# Patient Record
Sex: Male | Born: 1960
Health system: Southern US, Community
[De-identification: ages and names within clinical notes are randomized; demographics above are authoritative.]

## PROBLEM LIST (undated history)

## (undated) DIAGNOSIS — M109 Gout, unspecified: Secondary | ICD-10-CM

## (undated) DIAGNOSIS — R51 Headache: Secondary | ICD-10-CM

## (undated) DIAGNOSIS — I469 Cardiac arrest, cause unspecified: Secondary | ICD-10-CM

## (undated) DIAGNOSIS — Z86718 Personal history of other venous thrombosis and embolism: Secondary | ICD-10-CM

## (undated) DIAGNOSIS — G373 Acute transverse myelitis in demyelinating disease of central nervous system: Secondary | ICD-10-CM

## (undated) DIAGNOSIS — M1990 Unspecified osteoarthritis, unspecified site: Secondary | ICD-10-CM

## (undated) DIAGNOSIS — I2699 Other pulmonary embolism without acute cor pulmonale: Secondary | ICD-10-CM

## (undated) DIAGNOSIS — M549 Dorsalgia, unspecified: Secondary | ICD-10-CM

## (undated) DIAGNOSIS — G8929 Other chronic pain: Secondary | ICD-10-CM

## (undated) DIAGNOSIS — M2040 Other hammer toe(s) (acquired), unspecified foot: Secondary | ICD-10-CM

## (undated) DIAGNOSIS — I10 Essential (primary) hypertension: Secondary | ICD-10-CM

## (undated) HISTORY — PX: HAMMER TOE SURGERY: SHX385

## (undated) HISTORY — DX: Personal history of other venous thrombosis and embolism: Z86.718

## (undated) HISTORY — DX: Cardiac arrest, cause unspecified: I46.9

## (undated) HISTORY — DX: Other hammer toe(s) (acquired), unspecified foot: M20.40

## (undated) HISTORY — DX: Acute transverse myelitis in demyelinating disease of central nervous system: G37.3

## (undated) HISTORY — DX: Other pulmonary embolism without acute cor pulmonale: I26.99

---
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
## 2011-12-05 ENCOUNTER — Ambulatory Visit (INDEPENDENT_AMBULATORY_CARE_PROVIDER_SITE_OTHER): Payer: BC Managed Care – PPO | Admitting: Emergency Medicine

## 2011-12-05 VITALS — BP 110/64 | HR 66 | Temp 98.7°F | Resp 18 | Ht 78.0 in | Wt 308.0 lb

## 2011-12-05 DIAGNOSIS — Z111 Encounter for screening for respiratory tuberculosis: Secondary | ICD-10-CM

## 2011-12-05 NOTE — Progress Notes (Signed)
   Date:  12/05/2011   Name:  MALIQUE DRISKILL   DOB:  04-Oct-1960   MRN:  [id number] Gender: male Age: 51 y.o.  PCP:  No primary provider on file.    Chief Complaint: PPD Reading   History of Present Illness:  Matthew Juarez is a 51 y.o. pleasant patient who presents with the following:  For TB skin test  There is no problem list on file for this patient.   No past medical history on file.  No past surgical history on file.  History  Substance Use Topics  . Smoking status: Never Smoker   . Smokeless tobacco: Not on file  . Alcohol Use: No    No family history on file.  No Known Allergies  Medication list has been reviewed and updated.  No current outpatient prescriptions on file prior to visit.    Review of Systems:  As per HPI, otherwise negative.    Physical Examination: Filed Vitals:   12/05/11 1648  BP: 110/64  Pulse: 66  Temp: 98.7 F (37.1 C)  Resp: 18   Filed Vitals:   12/05/11 1648  Height: 6\' 6"  (1.981 m)  Weight: 308 lb (139.708 kg)   Body mass index is 35.59 kg/(m^2). Ideal Body Weight: Weight in (lb) to have BMI = 25: 215.9    GEN: WDWN, NAD, Non-toxic, Alert & Oriented x 3 HEENT: Atraumatic, Normocephalic.  Ears and Nose: No external deformity. EXTR: No clubbing/cyanosis/edema NEURO: Normal gait.  PSYCH: Normally interactive. Conversant. Not depressed or anxious appearing.  Calm demeanor.     Assessment and Plan: TB skin test   Carmelina Dane, MD

## 2011-12-05 NOTE — Addendum Note (Signed)
Addended by: Eulah Pont on: 12/05/2011 04:58 PM   Modules accepted: Orders

## 2011-12-08 ENCOUNTER — Encounter (INDEPENDENT_AMBULATORY_CARE_PROVIDER_SITE_OTHER): Payer: BC Managed Care – PPO | Admitting: Internal Medicine

## 2011-12-08 DIAGNOSIS — Z111 Encounter for screening for respiratory tuberculosis: Secondary | ICD-10-CM

## 2012-04-12 ENCOUNTER — Emergency Department: Payer: Self-pay | Admitting: Emergency Medicine

## 2012-04-12 LAB — COMPREHENSIVE METABOLIC PANEL
Albumin: 4.2 g/dL (ref 3.4–5.0)
Anion Gap: 5 — ABNORMAL LOW (ref 7–16)
BUN: 14 mg/dL (ref 7–18)
Bilirubin,Total: 1 mg/dL (ref 0.2–1.0)
Calcium, Total: 9.2 mg/dL (ref 8.5–10.1)
Chloride: 104 mmol/L (ref 98–107)
Creatinine: 0.99 mg/dL (ref 0.60–1.30)
EGFR (African American): >60
Osmolality: 270 (ref 275–301)
Potassium: 4 mmol/L (ref 3.5–5.1)
SGOT(AST): 29 U/L (ref 15–37)
SGPT (ALT): 32 U/L (ref 12–78)
Sodium: 135 mmol/L — ABNORMAL LOW (ref 136–145)
Total Protein: 7.7 g/dL (ref 6.4–8.2)

## 2012-04-12 LAB — CBC
HCT: 43.8 % (ref 40.0–52.0)
MCH: 28.2 pg (ref 26.0–34.0)
RDW: 13.3 % (ref 11.5–14.5)

## 2012-04-12 LAB — SEDIMENTATION RATE: Erythrocyte Sed Rate: 4 mm/hr (ref 0–20)

## 2012-04-16 ENCOUNTER — Emergency Department (HOSPITAL_COMMUNITY): Payer: BC Managed Care – PPO

## 2012-04-16 ENCOUNTER — Encounter (HOSPITAL_COMMUNITY): Payer: Self-pay | Admitting: Emergency Medicine

## 2012-04-16 ENCOUNTER — Inpatient Hospital Stay (HOSPITAL_COMMUNITY)
Admission: EM | Admit: 2012-04-16 | Discharge: 2012-04-20 | DRG: 020 | Disposition: A | Discharge status: Rehabilitation Facility | Payer: BC Managed Care – PPO | Attending: Internal Medicine | Admitting: Internal Medicine

## 2012-04-16 DIAGNOSIS — M546 Pain in thoracic spine: Secondary | ICD-10-CM

## 2012-04-16 DIAGNOSIS — I10 Essential (primary) hypertension: Secondary | ICD-10-CM | POA: Diagnosis present

## 2012-04-16 DIAGNOSIS — I4729 Other ventricular tachycardia: Secondary | ICD-10-CM

## 2012-04-16 DIAGNOSIS — S82899A Other fracture of unspecified lower leg, initial encounter for closed fracture: Secondary | ICD-10-CM | POA: Diagnosis present

## 2012-04-16 DIAGNOSIS — G8929 Other chronic pain: Secondary | ICD-10-CM | POA: Diagnosis present

## 2012-04-16 DIAGNOSIS — S82891A Other fracture of right lower leg, initial encounter for closed fracture: Secondary | ICD-10-CM

## 2012-04-16 DIAGNOSIS — I472 Ventricular tachycardia, unspecified: Secondary | ICD-10-CM | POA: Diagnosis not present

## 2012-04-16 DIAGNOSIS — G959 Disease of spinal cord, unspecified: Secondary | ICD-10-CM

## 2012-04-16 DIAGNOSIS — M549 Dorsalgia, unspecified: Secondary | ICD-10-CM | POA: Diagnosis present

## 2012-04-16 DIAGNOSIS — G373 Acute transverse myelitis in demyelinating disease of central nervous system: Secondary | ICD-10-CM

## 2012-04-16 DIAGNOSIS — G8220 Paraplegia, unspecified: Secondary | ICD-10-CM | POA: Diagnosis present

## 2012-04-16 DIAGNOSIS — M109 Gout, unspecified: Secondary | ICD-10-CM | POA: Diagnosis present

## 2012-04-16 DIAGNOSIS — W19XXXA Unspecified fall, initial encounter: Secondary | ICD-10-CM | POA: Diagnosis present

## 2012-04-16 HISTORY — DX: Gout, unspecified: M10.9

## 2012-04-16 HISTORY — DX: Dorsalgia, unspecified: M54.9

## 2012-04-16 HISTORY — DX: Essential (primary) hypertension: I10

## 2012-04-16 HISTORY — DX: Other chronic pain: G89.29

## 2012-04-16 LAB — COMPREHENSIVE METABOLIC PANEL
BUN: 15 mg/dL (ref 6–23)
CO2: 24 mEq/L (ref 19–32)
Calcium: 9.7 mg/dL (ref 8.4–10.5)
Creatinine, Ser: 0.88 mg/dL (ref 0.50–1.35)
GFR calc Af Amer: >90 mL/min (ref >90)
GFR calc non Af Amer: >90 mL/min (ref >90)
Glucose, Bld: 109 mg/dL — ABNORMAL HIGH (ref 70–99)
Sodium: 138 mEq/L (ref 135–145)
Total Protein: 7.3 g/dL (ref 6.0–8.3)

## 2012-04-16 LAB — CBC WITH DIFFERENTIAL/PLATELET
Eosinophils Absolute: 0.2 10*3/uL (ref 0.0–0.7)
Eosinophils Relative: 3 % (ref 0–5)
HCT: 44.3 % (ref 39.0–52.0)
Lymphocytes Relative: 15 % (ref 12–46)
Lymphs Abs: 0.9 10*3/uL (ref 0.7–4.0)
MCH: 29.3 pg (ref 26.0–34.0)
MCV: 85.9 fL (ref 78.0–100.0)
Monocytes Absolute: 0.5 10*3/uL (ref 0.1–1.0)
Monocytes Relative: 9 % (ref 3–12)
Platelets: 170 10*3/uL (ref 150–400)
RBC: 5.16 MIL/uL (ref 4.22–5.81)

## 2012-04-16 MED ORDER — HYDROCODONE-ACETAMINOPHEN 5-325 MG PO TABS
1.0000 | ORAL_TABLET | Freq: Once | ORAL | Status: AC
  Administered 2012-04-16: 1 via ORAL
  Filled 2012-04-16: qty 1

## 2012-04-16 MED ORDER — GADOBENATE DIMEGLUMINE 529 MG/ML IV SOLN
20.0000 mL | Freq: Once | INTRAVENOUS | Status: AC
  Administered 2012-04-16: 20 mL via INTRAVENOUS

## 2012-04-16 NOTE — ED Notes (Signed)
Pt in MRI at this time 

## 2012-04-16 NOTE — ED Notes (Signed)
Monitor shows 02sats to be 78%/RA, pt laying on hand and hand has bluish tint sts it has tingling feeling. Pt asked to lay on back pt 02 sats increased to 98% pt sts numbness and tingling has gone away

## 2012-04-16 NOTE — ED Notes (Signed)
Pt sent here by Dr Sherryll Burger for increased leg and foot weakness x 5 days; concerned for possible spinal cord issue

## 2012-04-16 NOTE — ED Notes (Signed)
Pt @MRI.

## 2012-04-16 NOTE — ED Provider Notes (Addendum)
History     CSN: [id number]  Arrival date & time 04/16/12  1659   None     Chief Complaint  Patient presents with  . Numbness    (Consider location/radiation/quality/duration/timing/severity/associated sxs/prior treatment) Patient is a 52 y.o. male presenting with neurologic complaint. The history is provided by a relative (Pt sent in by a Dr. Sherryll Burger, a neurologist in [place], [place].).  Neurologic Problem The primary symptoms include focal weakness and loss of sensation. Primary symptoms do not include fever. The symptoms began 5 to 7 days ago. Focality: Pt has had numbness from the mid chest to his feet, starting 5 days ago and getting worse.  He fell yesterday, injuring the right ankle. Context: There was no history of injury, and no precipitating event.  Weakness began greater than 24 hours ago. The weakness is worsening. There is decreased muscle function with maximum physical effort.  Region/motion of weakness: Weakness and numbness in both legs. There is impairment of the following actions: walking.  Additional symptoms include weakness, pain, lower back pain and loss of balance. Medical issues also include hypertension. Associated medical issues comments: Gout.    Past Medical History  Diagnosis Date  . Chronic back pain     History reviewed. No pertinent past surgical history.  History reviewed. No pertinent family history.  History  Substance Use Topics  . Smoking status: Never Smoker   . Smokeless tobacco: Not on file  . Alcohol Use: No      Review of Systems  Constitutional: Negative for fever and chills.  HENT: Negative.   Eyes: Negative.   Respiratory: Negative.   Cardiovascular: Negative.   Gastrointestinal: Negative.        No BM for 2 days.  Genitourinary: Negative.        No incontinence.    Neurological: Positive for focal weakness, weakness and loss of balance.  Psychiatric/Behavioral: Negative.     Allergies  Review of patient's allergies  indicates no known allergies.  Home Medications  No current outpatient prescriptions on file.  BP 128/73  Pulse 94  Temp 97.8 F (36.6 C) (Oral)  Resp 18  SpO2 98%  Physical Exam  Nursing note and vitals reviewed. Constitutional: He is oriented to person, place, and time. He appears well-developed and well-nourished.       Complains of back pain and right ankle pain.  HENT:  Head: Normocephalic and atraumatic.  Right Ear: External ear normal.  Left Ear: External ear normal.  Mouth/Throat: Oropharynx is clear and moist.  Eyes: Conjunctivae normal are normal. Pupils are equal, round, and reactive to light.  Neck: Normal range of motion. Neck supple.  Cardiovascular: Normal rate, regular rhythm and normal heart sounds.   Pulmonary/Chest: Effort normal and breath sounds normal.  Abdominal: Soft. Bowel sounds are normal.  Musculoskeletal: Normal range of motion.       He localizes numbness from the midchest down to his feet.  There is no palpable deformity of the thoracic or lumbar spine.  He has swelling of the right ankle and pain on ROM of the right ankle.  Neurological: He is alert and oriented to person, place, and time.       Qualitative numbness in his body from midchest down to his feet.    Skin: Skin is warm and dry.  Psychiatric: He has a normal mood and affect. His behavior is normal.    ED Course  CRITICAL CARE Performed by: Osvaldo Human Authorized by: Osvaldo Human Total  critical care time: 60 minutes Critical care was necessary to treat or prevent imminent or life-threatening deterioration of the following conditions: 52 yo man has numbness in his body from the chest down, with bilateral leg weakness and a fall that injured the right ankle.  MR of T-spine shows thoracic spine lesion. Critical care was time spent personally by me on the following activities: discussions with consultants, examination of patient, obtaining history from patient or surrogate,  ordering and performing treatments and interventions, ordering and review of laboratory studies and ordering and review of radiographic studies.   (including critical care time)   Labs Reviewed  CBC WITH DIFFERENTIAL  COMPREHENSIVE METABOLIC PANEL  URINALYSIS, ROUTINE W REFLEX MICROSCOPIC   6:37 PM Note from Dr. Sherryll Burger reviewed.  Lab workup ordered.  Call to Davonna Belling, M.D., neuroradiologist and case discussed.  Ordered MR of the thoracic spine.  PO pain medicine ordered.  6:52 PM  Date: 04/16/2012  Rate: 83  Rhythm: normal sinus rhythm  QRS Axis: normal  Intervals: QT prolonged  ST/T Wave abnormalities: normal  Conduction Disutrbances:none  Narrative Interpretation: Abnormal EKG  Old EKG Reviewed: none available   11:44 PM Results for orders placed during the hospital encounter of 04/16/12  CBC WITH DIFFERENTIAL      Component Value Range   WBC 5.9  4.0 - 10.5 K/uL   RBC 5.16  4.22 - 5.81 MIL/uL   Hemoglobin 15.1  13.0 - 17.0 g/dL   HCT 16.1  09.6 - 04.5 %   MCV 85.9  78.0 - 100.0 fL   MCH 29.3  26.0 - 34.0 pg   MCHC 34.1  30.0 - 36.0 g/dL   RDW 40.9  81.1 - 91.4 %   Platelets 170  150 - 400 K/uL   Neutrophils Relative 73  43 - 77 %   Neutro Abs 4.3  1.7 - 7.7 K/uL   Lymphocytes Relative 15  12 - 46 %   Lymphs Abs 0.9  0.7 - 4.0 K/uL   Monocytes Relative 9  3 - 12 %   Monocytes Absolute 0.5  0.1 - 1.0 K/uL   Eosinophils Relative 3  0 - 5 %   Eosinophils Absolute 0.2  0.0 - 0.7 K/uL   Basophils Relative 0  0 - 1 %   Basophils Absolute 0.0  0.0 - 0.1 K/uL  COMPREHENSIVE METABOLIC PANEL      Component Value Range   Sodium 138  135 - 145 mEq/L   Potassium 3.8  3.5 - 5.1 mEq/L   Chloride 101  96 - 112 mEq/L   CO2 24  19 - 32 mEq/L   Glucose, Bld 109 (*) 70 - 99 mg/dL   BUN 15  6 - 23 mg/dL   Creatinine, Ser 7.82  0.50 - 1.35 mg/dL   Calcium 9.7  8.4 - 95.6 mg/dL   Total Protein 7.3  6.0 - 8.3 g/dL   Albumin 4.0  3.5 - 5.2 g/dL   AST 19  0 - 37 U/L   ALT 22  0  - 53 U/L   Alkaline Phosphatase 80  39 - 117 U/L   Total Bilirubin 1.0  0.3 - 1.2 mg/dL   GFR calc non Af Amer >90  >90 mL/min   GFR calc Af Amer >90  >90 mL/min   Dg Chest 2 View  04/16/2012  *RADIOLOGY REPORT*  Clinical Data: Numbness in legs.  Fall.  CHEST - 2 VIEW  Comparison: None.  Findings: Lateral views  degraded by patient arm position.  Frontal view is mildly degraded by patient body habitus.  Decreased mid thoracic vertebral body height at numerous levels is favored to be within normal variation.  Midline trachea.  Normal heart size.  Mild left hemidiaphragm elevation. No pleural effusion or pneumothorax.  Clear lungs.  No free intraperitoneal air.  IMPRESSION: Mildly low lung volumes, without acute disease.   Original Report Authenticated By: Jeronimo Greaves, M.D.    Dg Ankle Complete Right  04/16/2012  *RADIOLOGY REPORT*  Clinical Data: Trauma with pain and numbness.  RIGHT ANKLE - COMPLETE 3+ VIEW  Comparison: None.  Findings: Minimally displaced oblique fracture of the distal fibula with overlying soft tissue swelling. Base of fifth metatarsal and talar dome intact.  There is osseous irregularity about the anterior tibia on the lateral view.  IMPRESSION: Distal fibular fracture.  Anterior tibial osseous irregularity, favored to be degenerative.   Original Report Authenticated By: Jeronimo Greaves, M.D.     Verbal report from Dr. Benard Rink, neuroradiologist, shows a thoracic spine lesion of uncertain cause, differential diagnosis includes a transverse myelitis, tumor, multiple sclerosis.  Pt seen by Dr. Thana Farr, neurologist, who will perform lumbar puncture and advise on Rx with steroids.  She wants internal medicine to admit pt.  Will also consult orthopedics regarding pt's fracture of the right ankle.  12:20 AM Dr. Dorene Grebe will see pt in AM for orthopedic consultation.   1:16 AM Case discussed with Dr. Kara Pacer, who also spoke to Dr. Thad Ranger.  Admit to neuro floor.  1. Back pain,  thoracic   2. Transverse myelitis   3. Fracture of right ankle       Carleene Cooper III, MD 04/17/12 [id number]     Carleene Cooper III, MD 04/17/12 [phone]

## 2012-04-17 ENCOUNTER — Encounter (HOSPITAL_COMMUNITY): Payer: Self-pay | Admitting: Internal Medicine

## 2012-04-17 DIAGNOSIS — G8220 Paraplegia, unspecified: Secondary | ICD-10-CM | POA: Diagnosis present

## 2012-04-17 DIAGNOSIS — G959 Disease of spinal cord, unspecified: Secondary | ICD-10-CM

## 2012-04-17 DIAGNOSIS — I472 Ventricular tachycardia: Secondary | ICD-10-CM

## 2012-04-17 DIAGNOSIS — R6889 Other general symptoms and signs: Secondary | ICD-10-CM

## 2012-04-17 DIAGNOSIS — G0489 Other myelitis: Secondary | ICD-10-CM

## 2012-04-17 DIAGNOSIS — G373 Acute transverse myelitis in demyelinating disease of central nervous system: Secondary | ICD-10-CM

## 2012-04-17 DIAGNOSIS — S82899A Other fracture of unspecified lower leg, initial encounter for closed fracture: Secondary | ICD-10-CM

## 2012-04-17 LAB — CSF CELL COUNT WITH DIFFERENTIAL
Eosinophils, CSF: 0 % (ref 0–1)
Monocyte-Macrophage-Spinal Fluid: 26 % (ref 15–45)
RBC Count, CSF: 350 /mm3 — ABNORMAL HIGH
Segmented Neutrophils-CSF: 7 % — ABNORMAL HIGH (ref 0–6)
WBC, CSF: 18 /mm3 (ref 0–5)

## 2012-04-17 LAB — ANGIOTENSIN CONVERTING ENZYME: Angiotensin-Converting Enzyme: 28 U/L (ref 8–52)

## 2012-04-17 LAB — PHOSPHORUS: Phosphorus: 2.6 mg/dL (ref 2.3–4.6)

## 2012-04-17 LAB — SEDIMENTATION RATE: Sed Rate: 10 mm/hr (ref 0–16)

## 2012-04-17 LAB — URINALYSIS, ROUTINE W REFLEX MICROSCOPIC
Glucose, UA: NEGATIVE mg/dL
Leukocytes, UA: NEGATIVE
Nitrite: NEGATIVE
pH: 5 (ref 5.0–8.0)

## 2012-04-17 LAB — MAGNESIUM: Magnesium: 1.9 mg/dL (ref 1.5–2.5)

## 2012-04-17 LAB — COMPREHENSIVE METABOLIC PANEL
Albumin: 4.1 g/dL (ref 3.5–5.2)
BUN: 15 mg/dL (ref 6–23)
Calcium: 9.9 mg/dL (ref 8.4–10.5)
GFR calc Af Amer: >90 mL/min (ref >90)
Glucose, Bld: 155 mg/dL — ABNORMAL HIGH (ref 70–99)
Sodium: 135 mEq/L (ref 135–145)
Total Protein: 7.7 g/dL (ref 6.0–8.3)

## 2012-04-17 LAB — C-REACTIVE PROTEIN: CRP: 6.5 mg/dL — ABNORMAL HIGH (ref <0.60)

## 2012-04-17 LAB — CBC
HCT: 43.5 % (ref 39.0–52.0)
Hemoglobin: 15 g/dL (ref 13.0–17.0)
MCV: 85.8 fL (ref 78.0–100.0)
WBC: 8.9 10*3/uL (ref 4.0–10.5)

## 2012-04-17 LAB — PROTEIN AND GLUCOSE, CSF: Glucose, CSF: 70 mg/dL (ref 43–76)

## 2012-04-17 LAB — GRAM STAIN

## 2012-04-17 MED ORDER — ONDANSETRON HCL 4 MG PO TABS: 4.0000 mg | ORAL_TABLET | Freq: Four times a day (QID) | ORAL | Status: DC | PRN

## 2012-04-17 MED ORDER — MAGNESIUM HYDROXIDE 400 MG/5ML PO SUSP
30.0000 mL | Freq: Every day | ORAL | Status: DC | PRN
  Administered 2012-04-17: 30 mL via ORAL
  Filled 2012-04-17: qty 30

## 2012-04-17 MED ORDER — METHYLPREDNISOLONE SODIUM SUCC 1000 MG IJ SOLR
500.0000 mg | Freq: Two times a day (BID) | INTRAMUSCULAR | Status: DC
  Administered 2012-04-17 – 2012-04-20 (×6): 500 mg via INTRAVENOUS
  Filled 2012-04-17 (×9): qty 4

## 2012-04-17 MED ORDER — DOCUSATE SODIUM 100 MG PO CAPS
100.0000 mg | ORAL_CAPSULE | Freq: Two times a day (BID) | ORAL | Status: DC
  Administered 2012-04-17 – 2012-04-20 (×5): 100 mg via ORAL
  Filled 2012-04-17 (×7): qty 1

## 2012-04-17 MED ORDER — AMLODIPINE-OLMESARTAN 5-20 MG PO TABS: 1.0000 | ORAL_TABLET | Freq: Every day | ORAL | Status: DC

## 2012-04-17 MED ORDER — HYDROCODONE-ACETAMINOPHEN 5-325 MG PO TABS
1.0000 | ORAL_TABLET | ORAL | Status: DC | PRN
  Administered 2012-04-17: 2 via ORAL
  Administered 2012-04-17: 1 via ORAL
  Administered 2012-04-19: 2 via ORAL
  Filled 2012-04-17 (×3): qty 2

## 2012-04-17 MED ORDER — HYDROCODONE-ACETAMINOPHEN 5-325 MG PO TABS: 1.0000 | ORAL_TABLET | ORAL | Status: DC | PRN

## 2012-04-17 MED ORDER — AMLODIPINE BESYLATE 5 MG PO TABS
5.0000 mg | ORAL_TABLET | Freq: Every day | ORAL | Status: DC
  Administered 2012-04-17 – 2012-04-20 (×4): 5 mg via ORAL
  Filled 2012-04-17 (×4): qty 1

## 2012-04-17 MED ORDER — PANTOPRAZOLE SODIUM 40 MG PO TBEC
40.0000 mg | DELAYED_RELEASE_TABLET | Freq: Every day | ORAL | Status: DC
  Administered 2012-04-17 – 2012-04-20 (×4): 40 mg via ORAL
  Filled 2012-04-17 (×4): qty 1

## 2012-04-17 MED ORDER — SODIUM CHLORIDE 0.9 % IJ SOLN
3.0000 mL | Freq: Two times a day (BID) | INTRAMUSCULAR | Status: DC
  Administered 2012-04-17 – 2012-04-19 (×5): 3 mL via INTRAVENOUS

## 2012-04-17 MED ORDER — ONDANSETRON HCL 4 MG/2ML IJ SOLN: 4.0000 mg | Freq: Four times a day (QID) | INTRAMUSCULAR | Status: DC | PRN

## 2012-04-17 MED ORDER — MORPHINE SULFATE 2 MG/ML IJ SOLN: 2.0000 mg | INTRAMUSCULAR | Status: DC | PRN

## 2012-04-17 MED ORDER — ACETAMINOPHEN 650 MG RE SUPP: 650.0000 mg | Freq: Four times a day (QID) | RECTAL | Status: DC | PRN

## 2012-04-17 MED ORDER — ACETAMINOPHEN 325 MG PO TABS
650.0000 mg | ORAL_TABLET | Freq: Four times a day (QID) | ORAL | Status: DC | PRN
  Administered 2012-04-20: 650 mg via ORAL
  Filled 2012-04-17: qty 2

## 2012-04-17 MED ORDER — ALLOPURINOL 300 MG PO TABS
300.0000 mg | ORAL_TABLET | Freq: Every day | ORAL | Status: DC
  Administered 2012-04-17 – 2012-04-20 (×4): 300 mg via ORAL
  Filled 2012-04-17 (×4): qty 1

## 2012-04-17 MED ORDER — SODIUM CHLORIDE 0.9 % IV SOLN
INTRAVENOUS | Status: AC
  Administered 2012-04-17: 05:00:00 via INTRAVENOUS

## 2012-04-17 MED ORDER — IRBESARTAN 150 MG PO TABS
150.0000 mg | ORAL_TABLET | Freq: Every day | ORAL | Status: DC
  Administered 2012-04-17 – 2012-04-20 (×4): 150 mg via ORAL
  Filled 2012-04-17 (×4): qty 1

## 2012-04-17 NOTE — Consult Note (Signed)
Reason for Consult: Ankle pain Referring Physician: Dr. Anderson Malta is an 52 y.o. male.  HPI: Matthew Juarez is a previously healthy 52 year old patient who developed weakness in bilateral lower extremities and fell and injured his right ankle. He is currently admitted on the medical service with lesion in the cervical spinal cord. Workup for that is in progress. He describes lower extremity weakness. He denies any other orthopedic complaint from his fall other than right ankle pain.  Past Medical History  Diagnosis Date  . Chronic back pain   . Hypertension   . Gout     History reviewed. No pertinent past surgical history.  Family History  Problem Relation Age of Onset  . Lung cancer Maternal Uncle   . Colon cancer Maternal Aunt     Social History:  reports that he has never smoked. He does not have any smokeless tobacco history on file. He reports that he does not drink alcohol or use illicit drugs.  Allergies: No Known Allergies  Medications: I have reviewed the patient's current medications.  Results for orders placed during the hospital encounter of 04/16/12 (from the past 48 hour(s))  CBC WITH DIFFERENTIAL     Status: Normal   Collection Time   04/16/12  6:18 PM      Component Value Range Comment   WBC 5.9  4.0 - 10.5 K/uL    RBC 5.16  4.22 - 5.81 MIL/uL    Hemoglobin 15.1  13.0 - 17.0 g/dL    HCT 16.1  09.6 - 04.5 %    MCV 85.9  78.0 - 100.0 fL    MCH 29.3  26.0 - 34.0 pg    MCHC 34.1  30.0 - 36.0 g/dL    RDW 40.9  81.1 - 91.4 %    Platelets 170  150 - 400 K/uL    Neutrophils Relative 73  43 - 77 %    Neutro Abs 4.3  1.7 - 7.7 K/uL    Lymphocytes Relative 15  12 - 46 %    Lymphs Abs 0.9  0.7 - 4.0 K/uL    Monocytes Relative 9  3 - 12 %    Monocytes Absolute 0.5  0.1 - 1.0 K/uL    Eosinophils Relative 3  0 - 5 %    Eosinophils Absolute 0.2  0.0 - 0.7 K/uL    Basophils Relative 0  0 - 1 %    Basophils Absolute 0.0  0.0 - 0.1 K/uL   COMPREHENSIVE  METABOLIC PANEL     Status: Abnormal   Collection Time   04/16/12  6:18 PM      Component Value Range Comment   Sodium 138  135 - 145 mEq/L    Potassium 3.8  3.5 - 5.1 mEq/L    Chloride 101  96 - 112 mEq/L    CO2 24  19 - 32 mEq/L    Glucose, Bld 109 (*) 70 - 99 mg/dL    BUN 15  6 - 23 mg/dL    Creatinine, Ser 7.82  0.50 - 1.35 mg/dL    Calcium 9.7  8.4 - 95.6 mg/dL    Total Protein 7.3  6.0 - 8.3 g/dL    Albumin 4.0  3.5 - 5.2 g/dL    AST 19  0 - 37 U/L    ALT 22  0 - 53 U/L    Alkaline Phosphatase 80  39 - 117 U/L    Total Bilirubin 1.0  0.3 -  1.2 mg/dL    GFR calc non Af Amer >90  >90 mL/min    GFR calc Af Amer >90  >90 mL/min   SEDIMENTATION RATE     Status: Normal   Collection Time   04/17/12 12:57 AM      Component Value Range Comment   Sed Rate 10  0 - 16 mm/hr   PROTEIN AND GLUCOSE, CSF     Status: Abnormal   Collection Time   04/17/12 12:59 AM      Component Value Range Comment   Glucose, CSF 70  43 - 76 mg/dL    Total  Protein, CSF 53 (*) 15 - 45 mg/dL   CSF CELL COUNT WITH DIFFERENTIAL     Status: Abnormal   Collection Time   04/17/12 12:59 AM      Component Value Range Comment   Tube # 1      Color, CSF STRAW (*) COLORLESS    Appearance, CSF HAZY (*) CLEAR    Supernatant CLEAR      RBC Count, CSF 350 (*) 0 /cu mm    WBC, CSF 18 (*) 0 - 5 /cu mm    Segmented Neutrophils-CSF 7 (*) 0 - 6 %    Lymphs, CSF 67  40 - 80 %    Monocyte-Macrophage-Spinal Fluid 26  15 - 45 %    Eosinophils, CSF 0  0 - 1 %   GRAM STAIN     Status: Normal   Collection Time   04/17/12 12:59 AM      Component Value Range Comment   Specimen Description CSF      Special Requests Normal      Gram Stain        Value: [id number]     NO ORGANISMS SEEN     WBC PRESENT,BOTH PMN AND MONONUCLEAR     Results Called to: Earlene Plater [id number] @[id number]  [facility]   Report Status 04/17/2012 FINAL     URINALYSIS, ROUTINE W REFLEX MICROSCOPIC     Status: Abnormal   Collection Time   04/17/12  2:01 AM       Component Value Range Comment   Color, Urine AMBER (*) YELLOW BIOCHEMICALS MAY BE AFFECTED BY COLOR   APPearance CLOUDY (*) CLEAR    Specific Gravity, Urine 1.043 (*) 1.005 - 1.030    pH 5.0  5.0 - 8.0    Glucose, UA NEGATIVE  NEGATIVE mg/dL    Hgb urine dipstick NEGATIVE  NEGATIVE    Bilirubin Urine NEGATIVE  NEGATIVE    Ketones, ur 15 (*) NEGATIVE mg/dL    Protein, ur NEGATIVE  NEGATIVE mg/dL    Urobilinogen, UA 0.2  0.0 - 1.0 mg/dL    Nitrite NEGATIVE  NEGATIVE    Leukocytes, UA NEGATIVE  NEGATIVE MICROSCOPIC NOT DONE ON URINES WITH NEGATIVE PROTEIN, BLOOD, LEUKOCYTES, NITRITE, OR GLUCOSE <1000 mg/dL.    Dg Chest 2 View  04/16/2012  *RADIOLOGY REPORT*  Clinical Data: Numbness in legs.  Fall.  CHEST - 2 VIEW  Comparison: None.  Findings: Lateral views degraded by patient arm position.  Frontal view is mildly degraded by patient body habitus.  Decreased mid thoracic vertebral body height at numerous levels is favored to be within normal variation.  Midline trachea.  Normal heart size.  Mild left hemidiaphragm elevation. No pleural effusion or pneumothorax.  Clear lungs.  No free intraperitoneal air.  IMPRESSION: Mildly low lung volumes, without acute disease.   Original Report Authenticated By: Jeronimo Greaves,  M.D.    Dg Ankle Complete Right  04/16/2012  *RADIOLOGY REPORT*  Clinical Data: Trauma with pain and numbness.  RIGHT ANKLE - COMPLETE 3+ VIEW  Comparison: None.  Findings: Minimally displaced oblique fracture of the distal fibula with overlying soft tissue swelling. Base of fifth metatarsal and talar dome intact.  There is osseous irregularity about the anterior tibia on the lateral view.  IMPRESSION: Distal fibular fracture.  Anterior tibial osseous irregularity, favored to be degenerative.   Original Report Authenticated By: Jeronimo Greaves, M.D.    Mr Cervical Spine W Wo Contrast  04/17/2012  *RADIOLOGY REPORT*  Clinical Data:  Gradual onset of ascending numbness.  Recent fall. Now with  significant progressive weakness of the right lower extremity.  No bowel or bladder difficulty.  Abnormal cerebrospinal fluid with elevated protein, and cells.  MRI CERVICAL AND THORACIC SPINE WITHOUT AND WITH CONTRAST  Technique:  Multiplanar and multiecho pulse sequences of the cervical spine, to include the craniocervical junction and cervicothoracic junction, and the thoracic spine, were obtained without and with intravenous contrast.  Contrast: 20mL MULTIHANCE GADOBENATE DIMEGLUMINE 529 MG/ML IV SOLN  Comparison:  None.  MRI CERVICAL SPINE  Findings:  There is there is an intramedullary lower cervical process with prolonged T1 and T2 signal, centrally located within the cord.  The cord is diffusely enlarged from C5 downward into the thoracic region.  There is no syrinx or tonsillar herniation. Axial images demonstrate that the peripheral white matter fiber pathways are spared.  Post infusion, there is variable abnormal enhancement which tends to be eccentric posteriorly in the cervicothoracic region.  Axial images reveal a central protrusion at C5-6 which effaces the anterior subarachnoid space but does not compress the cord or exiting nerve roots.  There is mild annular bulging at C6-7.  At C7- T1 there is asymmetric uncinate spurring on the right.  IMPRESSION: Intrinsic intramedullary cord lesion with prolonged T2 signal and variable abnormal enhancement.  Findings most consistent with idiopathic acute transverse myelitis.  Other considerations such as cord neoplasm, or multiple sclerosis are less likely. Cord infarction is not favored, given the post contrast enhancement.  MRI THORACIC SPINE  Findings: As noted in the cervical region, there is an intramedullary upper thoracic cord lesion with prolonged T1 and T2 signal located centrally within the cord.  This is continuous with the cervical lesion extending from C5 downward as far as the T6 vertebral body, below which the cord assumes a more normal size. The  lesion is solid, expands the cord, and does not represent a dilated central canal.  Post infusion imaging demonstrates variable enhancement which tends to be peripheral.  There is no subarachnoid, subdural, or epidural extramedullary lesion.  Axial images through the thoracic disc spaces demonstrate minor disc pathology which is non compressive.  This can be seen at T1-2 on the left,  T2-T3 on the left, T3-4 on the right, and T4-5 on the right.  No vertebral body abnormality is seen.  No paraspinous enhancement or pleural effusion is noted.  IMPRESSION:  Intrinsic intramedullary cord lesion with prolonged T2 signal and variable abnormal enhancement.  Findings consistent with idiopathic acute transverse myelitis.  Other considerations such as cord neoplasm, or multiple sclerosis are less likely.  I discussed the findings with Dr. Ignacia Palma, EDP, shortly after completion of the study.   Original Report Authenticated By: Davonna Belling, M.D.    Mr Thoracic Spine W Wo Contrast  04/17/2012  *RADIOLOGY REPORT*  Clinical Data:  Gradual onset of  ascending numbness.  Recent fall. Now with significant progressive weakness of the right lower extremity.  No bowel or bladder difficulty.  Abnormal cerebrospinal fluid with elevated protein, and cells.  MRI CERVICAL AND THORACIC SPINE WITHOUT AND WITH CONTRAST  Technique:  Multiplanar and multiecho pulse sequences of the cervical spine, to include the craniocervical junction and cervicothoracic junction, and the thoracic spine, were obtained without and with intravenous contrast.  Contrast: 20mL MULTIHANCE GADOBENATE DIMEGLUMINE 529 MG/ML IV SOLN  Comparison:  None.  MRI CERVICAL SPINE  Findings:  There is there is an intramedullary lower cervical process with prolonged T1 and T2 signal, centrally located within the cord.  The cord is diffusely enlarged from C5 downward into the thoracic region.  There is no syrinx or tonsillar herniation. Axial images demonstrate that the  peripheral white matter fiber pathways are spared.  Post infusion, there is variable abnormal enhancement which tends to be eccentric posteriorly in the cervicothoracic region.  Axial images reveal a central protrusion at C5-6 which effaces the anterior subarachnoid space but does not compress the cord or exiting nerve roots.  There is mild annular bulging at C6-7.  At C7- T1 there is asymmetric uncinate spurring on the right.  IMPRESSION: Intrinsic intramedullary cord lesion with prolonged T2 signal and variable abnormal enhancement.  Findings most consistent with idiopathic acute transverse myelitis.  Other considerations such as cord neoplasm, or multiple sclerosis are less likely. Cord infarction is not favored, given the post contrast enhancement.  MRI THORACIC SPINE  Findings: As noted in the cervical region, there is an intramedullary upper thoracic cord lesion with prolonged T1 and T2 signal located centrally within the cord.  This is continuous with the cervical lesion extending from C5 downward as far as the T6 vertebral body, below which the cord assumes a more normal size. The lesion is solid, expands the cord, and does not represent a dilated central canal.  Post infusion imaging demonstrates variable enhancement which tends to be peripheral.  There is no subarachnoid, subdural, or epidural extramedullary lesion.  Axial images through the thoracic disc spaces demonstrate minor disc pathology which is non compressive.  This can be seen at T1-2 on the left,  T2-T3 on the left, T3-4 on the right, and T4-5 on the right.  No vertebral body abnormality is seen.  No paraspinous enhancement or pleural effusion is noted.  IMPRESSION:  Intrinsic intramedullary cord lesion with prolonged T2 signal and variable abnormal enhancement.  Findings consistent with idiopathic acute transverse myelitis.  Other considerations such as cord neoplasm, or multiple sclerosis are less likely.  I discussed the findings with Dr.  Ignacia Palma, EDP, shortly after completion of the study.   Original Report Authenticated By: Davonna Belling, M.D.     Review of Systems  Constitutional: Negative.   HENT: Negative.   Eyes: Negative.   Respiratory: Negative.   Cardiovascular: Negative.   Gastrointestinal: Negative.   Genitourinary: Negative.   Musculoskeletal: Positive for joint pain and falls.  Skin: Negative.   Neurological: Positive for sensory change and focal weakness.  Psychiatric/Behavioral: Negative.    Blood pressure 133/70, pulse 86, temperature 98.2 F (36.8 C), temperature source Oral, resp. rate 18, height 6\' 5"  (1.956 m), weight 133.6 kg (294 lb 8.6 oz), SpO2 97.00%. Physical Exam  Constitutional: He appears well-developed.  HENT:  Head: Normocephalic.  Eyes: Pupils are equal, round, and reactive to light.  Cardiovascular: Normal rate.   Respiratory: Effort normal.   right lower extremity is splinted toe dorsiflexion  plantar flexion is intact but weak compartments are soft knee range of motion full soft tissue swelling is present laterally tenderness is present in this area as well no other crepitus or difficulty with joint swelling in the wrists elbows shoulders bilaterally or in the left lower extremity ankle knee or hip.  Assessment/Plan: Impression is minimally displaced right lateral malleolus fracture currently splinted no evidence of compartment syndrome plan nonweightbearing right lower extremity followup x-rays in one week workup in progress for lesion in the cervical spine area no other evidence of orthopedic injury at this time  DEAN,GREGORY SCOTT 04/17/2012, 9:47 AM

## 2012-04-17 NOTE — H&P (Signed)
PCP:   Alva Garnet., MD    Chief Complaint:   Lower ext weakness  HPI: Matthew Juarez is a 52 y.o. male   has a past medical history of Chronic back pain; Hypertension; and Gout.   Presented with  Leg numbness and weakness for the past 5 days it was progressive in nature. He denies any back pain, no fever or chills. At this point is un able to put any weight on his legs. He denies any weight loss. HE have not had a BM since Tuesday. NO urinary incontinence. He have had a normal colonoscopy 2 years ago.   He was so weak that he fell yesterday and fractured his right ankle, orthopedics has been consulted and will see him in AM.   MRI showed lesion at the affecting spinal cord with abnormal signal from C5-T6 showing tumor vs demyelination.   Neurology have seen him and have done an LP to evaluate for infection if negative they will start patient on steroids.   Hospitalist was called to admit.   Review of Systems:     Pertinent positives include:  tingling, weakness, constipation  Constitutional:  No weight loss, night sweats, Fevers, chills, fatigue, weight loss  HEENT:  No headaches, Difficulty swallowing,Tooth/dental problems,Sore throat,  No sneezing, itching, ear ache, nasal congestion, post nasal drip,  Cardio-vascular:  No chest pain, Orthopnea, PND, anasarca, dizziness, palpitations.no Bilateral lower extremity swelling  GI:  No heartburn, indigestion, abdominal pain, nausea, vomiting, diarrhea, change in bowel habits, loss of appetite, melena, blood in stool, hematemesis Resp:  no shortness of breath at rest. No dyspnea on exertion, No excess mucus, no productive cough, No non-productive cough, No coughing up of blood.No change in color of mucus.No wheezing. Skin:  no rash or lesions. No jaundice GU:  no dysuria, change in color of urine, no urgency or frequency. No straining to urinate.  No flank pain.  Musculoskeletal:  No joint pain or no joint swelling. No  decreased range of motion. No back pain.  Psych:  No change in mood or affect. No depression or anxiety. No memory loss.  Neuro:  no double vision, no gait abnormality, no slurred speech, no confusion  Otherwise ROS are negative except for above, 10 systems were reviewed  Past Medical History: Past Medical History  Diagnosis Date  . Chronic back pain   . Hypertension   . Gout    History reviewed. No pertinent past surgical history.   Medications: Prior to Admission medications   Medication Sig Start Date End Date Taking? Authorizing Provider  allopurinol (ZYLOPRIM) 300 MG tablet Take 300 mg by mouth daily.   Yes Historical Provider, MD  amLODipine-olmesartan (AZOR) 5-20 MG per tablet Take 1 tablet by mouth daily.   Yes Historical Provider, MD  Cholecalciferol (VITAMIN D PO) Take 1 tablet by mouth daily.   Yes Historical Provider, MD  Coenzyme Q10 (CO Q-10) 200 MG CAPS Take 1 capsule by mouth daily.   Yes Historical Provider, MD  diphenhydramine-acetaminophen (TYLENOL PM) 25-500 MG TABS Take 1 tablet by mouth at bedtime.   Yes Historical Provider, MD    Allergies:  No Known Allergies  Social History:  Ambulatory  independently   Lives at   Home with wife   reports that he has never smoked. He does not have any smokeless tobacco history on file. He reports that he does not drink alcohol.   Family History: family history is not on file.    Physical Exam: Patient  Vitals for the past 24 hrs:  BP Temp Temp src Pulse Resp SpO2  04/17/12 0045 108/53 mmHg - - 93  - 97 %  04/17/12 0000 122/66 mmHg - - 92  - 98 %  04/16/12 2333 101/69 mmHg 99.3 F (37.4 C) Oral 96  17  98 %  04/16/12 2045 129/71 mmHg - - 98  - 98 %  04/16/12 1943 120/81 mmHg - - 90  16  97 %  04/16/12 1706 128/73 mmHg 97.8 F (36.6 C) Oral 94  18  98 %    1. General:  in No Acute distress 2. Psychological: Alert and   Oriented 3. Head/ENT:   Moist   Mucous Membranes                          Head Non  traumatic, neck supple                          Normal   Dentition 4. SKIN: normal  Skin turgor,  Skin clean Dry and intact no rash 5. Heart: Regular rate and rhythm no Murmur, Rub or gallop 6. Lungs: Clear to auscultation bilaterally, no wheezes or crackles   7. Abdomen: Soft, non-tender, Non distended 8. Lower extremities: no clubbing, cyanosis, or edema 9. Neurologically diminished strength on the left lower extremity worse than the right. Diminished sensation on the left below the costovertebral margin. Right lower extremity is hard to examine as patient is in a splint secondary to fracture. Cranial nerves II through XII intact 10. MSK: Diminished range of motion in the right low extremity  body mass index is unknown because there is no height or weight on file.   Labs on Admission:   [facility] 04/16/12 1818  NA 138  K 3.8  CL 101  CO2 24  GLUCOSE 109*  BUN 15  CREATININE 0.88  CALCIUM 9.7  MG --  PHOS --    Basename 04/16/12 1818  AST 19  ALT 22  ALKPHOS 80  BILITOT 1.0  PROT 7.3  ALBUMIN 4.0   No results found for this basename: LIPASE:2,AMYLASE:2 in the last 72 hours  Basename 04/16/12 1818  WBC 5.9  NEUTROABS 4.3  HGB 15.1  HCT 44.3  MCV 85.9  PLT 170   No results found for this basename: CKTOTAL:3,CKMB:3,CKMBINDEX:3,TROPONINI:3 in the last 72 hours No results found for this basename: TSH,T4TOTAL,FREET3,T3FREE,THYROIDAB in the last 72 hours No results found for this basename: VITAMINB12:2,FOLATE:2,FERRITIN:2,TIBC:2,IRON:2,RETICCTPCT:2 in the last 72 hours No results found for this basename: HGBA1C    The CrCl is unknown because both a height and weight (above a minimum accepted value) are required for this calculation. ABG No results found for this basename: phart, pco2, po2, hco3, tco2, acidbasedef, o2sat     No results found for this basename: DDIMER     Other results:  I have pearsonaly reviewed this: ECG REPORT  Rate:83  Rhythm: NSR with  prolonged QT 503 ST&T Change: no ischemic changes.    Cultures: No results found for this basename: sdes, specrequest, cult, reptstatus       Radiological Exams on Admission: Dg Chest 2 View  04/16/2012  *RADIOLOGY REPORT*  Clinical Data: Numbness in legs.  Fall.  CHEST - 2 VIEW  Comparison: None.  Findings: Lateral views degraded by patient arm position.  Frontal view is mildly degraded by patient body habitus.  Decreased mid thoracic vertebral body height at  numerous levels is favored to be within normal variation.  Midline trachea.  Normal heart size.  Mild left hemidiaphragm elevation. No pleural effusion or pneumothorax.  Clear lungs.  No free intraperitoneal air.  IMPRESSION: Mildly low lung volumes, without acute disease.   Original Report Authenticated By: Jeronimo Greaves, M.D.    Dg Ankle Complete Right  04/16/2012  *RADIOLOGY REPORT*  Clinical Data: Trauma with pain and numbness.  RIGHT ANKLE - COMPLETE 3+ VIEW  Comparison: None.  Findings: Minimally displaced oblique fracture of the distal fibula with overlying soft tissue swelling. Base of fifth metatarsal and talar dome intact.  There is osseous irregularity about the anterior tibia on the lateral view.  IMPRESSION: Distal fibular fracture.  Anterior tibial osseous irregularity, favored to be degenerative.   Original Report Authenticated By: Jeronimo Greaves, M.D.     Chart has been reviewed  Assessment/Plan  52 yo With new bilateral paraplegia due to newly found cervical /thoracic spinal cord lesion,  Present on Admission:  . Lower paraplegia -  admit to neurology floor with frequent Neuro checks . Spinal cord lesion - as per neurology, broad differential at this point. As long as there is no evidence of infection Neurology will initiate IV steroids   Prophylaxis: SCD due to recent LP  Protonix  CODE STATUS: FULL CODE  Other plan as per orders.  I have spent a total of 55 min on this admission  Keyle Doby 04/17/2012,  1:08 AM

## 2012-04-17 NOTE — Evaluation (Signed)
Physical Therapy Evaluation Patient Details Name: Matthew Juarez MRN: [id number] DOB: 11/13/1960 Today's Date: 04/17/2012 Time: 6578-4696 PT Time Calculation (min): 29 min  PT Assessment / Plan / Recommendation Clinical Impression  pt adm with progressive weakness over 5-6 days.  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  Pt can  benefit from PT to maximize function    PT Assessment  Patient needs continued PT services    Follow Up Recommendations  CIR    Does the patient have the potential to tolerate intense rehabilitation      Barriers to Discharge Other (comment) (wife works, she might be able to get [employer])      Equipment Recommendations  Other (comment) (TBD post acute)    Recommendations for Other Services Rehab consult   Frequency Min 4X/week    Precautions / Restrictions Precautions Precautions: Fall Precaution Comments: loss of sensation or proprioception Restrictions Weight Bearing Restrictions: Yes RLE Weight Bearing: Partial weight bearing   Pertinent Vitals/Pain       Mobility  Bed Mobility Bed Mobility: Sitting - Scoot to Edge of Bed;Sit to Supine;Scooting to [facility] Sitting - Scoot to Edge of Bed: 4: Min guard;Other (comment) (scooting away from EOB) Sit to Supine: 4: Min assist;HOB flat;Other (comment) (at LE's) Scooting to [facility]: 4: Min assist;Other (comment) (to position his L LE) Details for Bed Mobility Assistance: vc's for technique; some LE assist Transfers Transfers: Squat Pivot Transfers Squat Pivot Transfers: 3: Mod assist;With upper extremity assistance Details for Transfer Assistance: Needs help to position for transfer; vc's for hand placement and truncal and knee support.  pt tends to over compensate with UE's with potnetial to throw off assistance Ambulation/Gait Ambulation/Gait Assistance: Not tested (comment) Stairs: No Wheelchair Mobility Wheelchair Mobility: No    Shoulder Instructions     Exercises     PT Diagnosis: Generalized  weakness;Other (comment) (paraplegia)  PT Problem List: Decreased strength;Decreased mobility;Decreased balance;Decreased knowledge of use of DME;Impaired sensation;Decreased coordination PT Treatment Interventions: DME instruction;Functional mobility training;Therapeutic activities;Therapeutic exercise;Balance training;Patient/family education;Neuromuscular re-education;Other (comment) (pregait)   PT Goals Acute Rehab PT Goals PT Goal Formulation: With patient Time For Goal Achievement: 05/01/12 Potential to Achieve Goals: Good Pt will go Supine/Side to Sit: with supervision;with HOB 0 degrees PT Goal: Supine/Side to Sit - Progress: Goal set today Pt will go Sit to Supine/Side: with supervision;with HOB 0 degrees PT Goal: Sit to Supine/Side - Progress: Goal set today Pt will go Sit to Stand: with mod assist PT Goal: Sit to Stand - Progress: Goal set today Pt will go Stand to Sit: with mod assist PT Goal: Stand to Sit - Progress: Goal set today Pt will Transfer Bed to Chair/Chair to Bed: with min assist PT Transfer Goal: Bed to Chair/Chair to Bed - Progress: Goal set today Additional Goals Additional Goal #1: pregait activity at EOB with +2 pt>=50% PT Goal: Additional Goal #1 - Progress: Goal set today  Visit Information  Last PT Received On: 04/17/12 Assistance Needed: +2    Subjective Data  Subjective: Yeah I can feel anything from the chest down Patient Stated Goal: Healed back up and back to work   Prior Functioning  Home Living Lives With: Spouse Available Help at Discharge: Other (Comment) (family works) Type of Home: House Home Access: Stairs to enter Secretary/administrator of Steps: 5 Entrance Stairs-Rails: Right;Left Home Layout: One level Bathroom Shower/Tub: Tub/shower unit;Tub only;Door [employer] Toilet: Standard Bathroom Accessibility: Yes How Accessible: Accessible via walker;Accessible via wheelchair (only to the front area of the bathroom)  Home Adaptive  Equipment: None (w/c too small for him) Prior Function Level of Independence: Independent Able to Take Stairs?: Yes Driving: Yes Vocation: Full time employment Communication Communication: No difficulties    Cognition  Overall Cognitive Status: Appears within functional limits for tasks assessed/performed Arousal/Alertness: Awake/alert Orientation Level: Oriented X4 / Intact Behavior During Session: [facility] for tasks performed    Extremity/Trunk Assessment Right Upper Extremity Assessment RUE ROM/Strength/Tone: Within functional levels Left Upper Extremity Assessment LUE ROM/Strength/Tone: Within functional levels Right Lower Extremity Assessment RLE ROM/Strength/Tone: Deficits RLE ROM/Strength/Tone Deficits: muscle fasiculation only to command RLE Sensation: Deficits RLE Sensation Deficits: loss of sensation--LT, severely diminished or absent proprioception RLE Coordination: Deficits Left Lower Extremity Assessment LLE ROM/Strength/Tone: Deficits LLE ROM/Strength/Tone Deficits: quads/hams/df/pf >=4/5, hip flexors 4-/5 LLE Sensation: Deficits LLE Sensation Deficits: loss of sensation and severely diminished or absent proprioception LLE Coordination: Deficits LLE Coordination Deficits: gross motor only Trunk Assessment Trunk Assessment:  (gross motor [facility])   Balance Balance Balance Assessed: Yes Static Sitting Balance Static Sitting - Balance Support: Feet supported;No upper extremity supported Static Sitting - Level of Assistance: 5: Stand by assistance Dynamic Sitting Balance Dynamic Sitting - Balance Support: During functional activity;Feet unsupported;Left upper extremity supported;Right upper extremity supported Dynamic Sitting - Level of Assistance: 5: Stand by assistance Dynamic Sitting - Comments: able to w/shift and pull his wet underwear off  End of Session PT - End of Session Activity Tolerance: Patient tolerated treatment well Patient left: in bed;with call  bell/phone within reach;with family/visitor present Nurse Communication: Mobility status  GP     Austine Kelsay, Eliseo Gum 04/17/2012, 4:01 PM  04/17/2012  [place] Bing, PT [phone] (pager)

## 2012-04-17 NOTE — Progress Notes (Signed)
NEURO HOSPITALIST PROGRESS NOTE   SUBJECTIVE:                                                                                                                        Matthew Juarez offers no new neurological complains today. He stated that he can barely move the right leg and the numbness is virtually unchanged. Denies recent infection, foreign travel, skin rash, headache, vertigo, double vision, slurred speech, language or vision impairment. Bladder control is appropriate no bowel movements yet. Serum ACE level is normal. CSF demonstrated slight elevation of white cells and protein with normal glucose. More extensive CSF testing is pending. Receiving IV solumedrol and reports no side effects that he can tell.  OBJECTIVE:                                                                                                                           Vital signs in last 24 hours: Temp:  [97.7 F (36.5 C)-99.3 F (37.4 C)] 98.2 F (36.8 C) (01/18 0405) Pulse Rate:  [86-98] 86  (01/18 0405) Resp:  [16-20] 18  (01/18 0405) BP: (101-133)/(53-91) 133/70 mmHg (01/18 0405) SpO2:  [97 %-98 %] 97 % (01/18 0405) Weight:  [133.6 kg (294 lb 8.6 oz)] 133.6 kg (294 lb 8.6 oz) (01/18 0440)  Intake/Output from previous day: 01/17 0701 - 01/18 0700 In: 465 [P.O.:240; I.V.:225] Out: 200 [Urine:200] Intake/Output this shift:   Nutritional status: Cardiac  Past Medical History  Diagnosis Date  . Chronic back pain   . Hypertension   . Gout     Neurologic ROS negative with exception of above. Musculoskeletal ROS: right ankle pain.  Neurologic Exam:  Neuro exam is virtually unchanged today, and is significant for 1-2/5 weakness right lower extremity and 4/5 weakness left hip flexors, as well as markedly diminished light touch and pinprick from the left nipple all the way downward. Agree that there is also some sensory involvement in the right from the umbilicus  downward. DTR's seem to be preserved (unable to test right ankle jerk).  Lab Results: No results found for this basename: cbc, bmp, coags, chol, tri, ldl, hga1c   Lipid Panel No results found for this basename: CHOL,TRIG,HDL,CHOLHDL,VLDL,LDLCALC in the last 72 hours  Studies/Results: Dg Chest 2 View  04/16/2012  *RADIOLOGY REPORT*  Clinical Data: Numbness in legs.  Fall.  CHEST - 2 VIEW  Comparison: None.  Findings: Lateral views degraded by patient arm position.  Frontal view is mildly degraded by patient body habitus.  Decreased mid thoracic vertebral body height at numerous levels is favored to be within normal variation.  Midline trachea.  Normal heart size.  Mild left hemidiaphragm elevation. No pleural effusion or pneumothorax.  Clear lungs.  No free intraperitoneal air.  IMPRESSION: Mildly low lung volumes, without acute disease.   Original Report Authenticated By: Jeronimo Greaves, M.D.    Dg Ankle Complete Right  04/16/2012  *RADIOLOGY REPORT*  Clinical Data: Trauma with pain and numbness.  RIGHT ANKLE - COMPLETE 3+ VIEW  Comparison: None.  Findings: Minimally displaced oblique fracture of the distal fibula with overlying soft tissue swelling. Base of fifth metatarsal and talar dome intact.  There is osseous irregularity about the anterior tibia on the lateral view.  IMPRESSION: Distal fibular fracture.  Anterior tibial osseous irregularity, favored to be degenerative.   Original Report Authenticated By: Jeronimo Greaves, M.D.    Mr Cervical Spine W Wo Contrast  04/17/2012  *RADIOLOGY REPORT*  Clinical Data:  Gradual onset of ascending numbness.  Recent fall. Now with significant progressive weakness of the right lower extremity.  No bowel or bladder difficulty.  Abnormal cerebrospinal fluid with elevated protein, and cells.  MRI CERVICAL AND THORACIC SPINE WITHOUT AND WITH CONTRAST  Technique:  Multiplanar and multiecho pulse sequences of the cervical spine, to include the craniocervical junction  and cervicothoracic junction, and the thoracic spine, were obtained without and with intravenous contrast.  Contrast: 20mL MULTIHANCE GADOBENATE DIMEGLUMINE 529 MG/ML IV SOLN  Comparison:  None.  MRI CERVICAL SPINE  Findings:  There is there is an intramedullary lower cervical process with prolonged T1 and T2 signal, centrally located within the cord.  The cord is diffusely enlarged from C5 downward into the thoracic region.  There is no syrinx or tonsillar herniation. Axial images demonstrate that the peripheral white matter fiber pathways are spared.  Post infusion, there is variable abnormal enhancement which tends to be eccentric posteriorly in the cervicothoracic region.  Axial images reveal a central protrusion at C5-6 which effaces the anterior subarachnoid space but does not compress the cord or exiting nerve roots.  There is mild annular bulging at C6-7.  At C7- T1 there is asymmetric uncinate spurring on the right.  IMPRESSION: Intrinsic intramedullary cord lesion with prolonged T2 signal and variable abnormal enhancement.  Findings most consistent with idiopathic acute transverse myelitis.  Other considerations such as cord neoplasm, or multiple sclerosis are less likely. Cord infarction is not favored, given the post contrast enhancement.  MRI THORACIC SPINE  Findings: As noted in the cervical region, there is an intramedullary upper thoracic cord lesion with prolonged T1 and T2 signal located centrally within the cord.  This is continuous with the cervical lesion extending from C5 downward as far as the T6 vertebral body, below which the cord assumes a more normal size. The lesion is solid, expands the cord, and does not represent a dilated central canal.  Post infusion imaging demonstrates variable enhancement which tends to be peripheral.  There is no subarachnoid, subdural, or epidural extramedullary lesion.  Axial images through the thoracic disc spaces demonstrate minor disc pathology which is non  compressive.  This can be seen at T1-2 on the left,  T2-T3 on the left, T3-4 on  the right, and T4-5 on the right.  No vertebral body abnormality is seen.  No paraspinous enhancement or pleural effusion is noted.  IMPRESSION:  Intrinsic intramedullary cord lesion with prolonged T2 signal and variable abnormal enhancement.  Findings consistent with idiopathic acute transverse myelitis.  Other considerations such as cord neoplasm, or multiple sclerosis are less likely.  I discussed the findings with Dr. Ignacia Palma, EDP, shortly after completion of the study.   Original Report Authenticated By: Davonna Belling, M.D.    Mr Thoracic Spine W Wo Contrast  04/17/2012  *RADIOLOGY REPORT*  Clinical Data:  Gradual onset of ascending numbness.  Recent fall. Now with significant progressive weakness of the right lower extremity.  No bowel or bladder difficulty.  Abnormal cerebrospinal fluid with elevated protein, and cells.  MRI CERVICAL AND THORACIC SPINE WITHOUT AND WITH CONTRAST  Technique:  Multiplanar and multiecho pulse sequences of the cervical spine, to include the craniocervical junction and cervicothoracic junction, and the thoracic spine, were obtained without and with intravenous contrast.  Contrast: 20mL MULTIHANCE GADOBENATE DIMEGLUMINE 529 MG/ML IV SOLN  Comparison:  None.  MRI CERVICAL SPINE  Findings:  There is there is an intramedullary lower cervical process with prolonged T1 and T2 signal, centrally located within the cord.  The cord is diffusely enlarged from C5 downward into the thoracic region.  There is no syrinx or tonsillar herniation. Axial images demonstrate that the peripheral white matter fiber pathways are spared.  Post infusion, there is variable abnormal enhancement which tends to be eccentric posteriorly in the cervicothoracic region.  Axial images reveal a central protrusion at C5-6 which effaces the anterior subarachnoid space but does not compress the cord or exiting nerve roots.  There is mild  annular bulging at C6-7.  At C7- T1 there is asymmetric uncinate spurring on the right.  IMPRESSION: Intrinsic intramedullary cord lesion with prolonged T2 signal and variable abnormal enhancement.  Findings most consistent with idiopathic acute transverse myelitis.  Other considerations such as cord neoplasm, or multiple sclerosis are less likely. Cord infarction is not favored, given the post contrast enhancement.  MRI THORACIC SPINE  Findings: As noted in the cervical region, there is an intramedullary upper thoracic cord lesion with prolonged T1 and T2 signal located centrally within the cord.  This is continuous with the cervical lesion extending from C5 downward as far as the T6 vertebral body, below which the cord assumes a more normal size. The lesion is solid, expands the cord, and does not represent a dilated central canal.  Post infusion imaging demonstrates variable enhancement which tends to be peripheral.  There is no subarachnoid, subdural, or epidural extramedullary lesion.  Axial images through the thoracic disc spaces demonstrate minor disc pathology which is non compressive.  This can be seen at T1-2 on the left,  T2-T3 on the left, T3-4 on the right, and T4-5 on the right.  No vertebral body abnormality is seen.  No paraspinous enhancement or pleural effusion is noted.  IMPRESSION:  Intrinsic intramedullary cord lesion with prolonged T2 signal and variable abnormal enhancement.  Findings consistent with idiopathic acute transverse myelitis.  Other considerations such as cord neoplasm, or multiple sclerosis are less likely.  I discussed the findings with Dr. Ignacia Palma, EDP, shortly after completion of the study.   Original Report Authenticated By: Davonna Belling, M.D.     MEDICATIONS  I have reviewed the patient's current medications.  ASSESSMENT/PLAN:                                                                                                            Very pleasant 52 years old gentleman with a cord syndrome and neuroimaging suggestive of transverse myelitis of unclear etiology, but likely inflammatory. Awaiting for CSF results. Continue IV solumedrol. Will continue to follow.  Wyatt Portela, MD [facility] [phone]  04/17/2012, 2:59 PM

## 2012-04-17 NOTE — Consult Note (Signed)
Reason for Consult:LE weakness Referring Physician: Ignacia Palma  CC: LE weakness and numbness  HPI: Matthew Juarez is an 52 y.o. male who reports that on Sunday he was walking around his home and began to feel numbness that involved his abdomen and everything below to his toes.  This progressed during the week and on the sixteenth although still able to walk had to hold onto things.  Fell on the 16th and fractured his ankle on the right leg.  Saw his physician today who felt he needed evaluation in the ED.  Patient now has significant weakness of the right lower extremity.  Patient does not report bowel or bladder incontinence.  Has not had a bowel movement since Tuesday and usually has one daily.    Past Medical History  Diagnosis Date  . Chronic back pain   . Hypertension   . Gout     Past surgical history: none  Family history: Father alive with seizures.  Mother alive and well without medical problems.  Has one sister with hypertension.  Multiple family members on mother's side with cancer.  Most of paternal family history unknown.    Social History:  reports that he has never smoked. He does not have any smokeless tobacco history on file. He reports that he does not drink alcohol. His drug history not on file.  No Known Allergies  Medications: I have reviewed the patient's current medications. Prior to Admission:  Current outpatient prescriptions:allopurinol (ZYLOPRIM) 300 MG tablet, Take 300 mg by mouth daily., Disp: , Rfl: ;  amLODipine-olmesartan (AZOR) 5-20 MG per tablet, Take 1 tablet by mouth daily., Disp: , Rfl: ;  Cholecalciferol (VITAMIN D PO), Take 1 tablet by mouth daily., Disp: , Rfl: ;  Coenzyme Q10 (CO Q-10) 200 MG CAPS, Take 1 capsule by mouth daily., Disp: , Rfl:  diphenhydramine-acetaminophen (TYLENOL PM) 25-500 MG TABS, Take 1 tablet by mouth at bedtime., Disp: , Rfl:   ROS: History obtained from the patient  General ROS: negative for - chills, fatigue, fever, night  sweats, weight gain or weight loss Psychological ROS: negative for - behavioral disorder, hallucinations, memory difficulties, mood swings or suicidal ideation Ophthalmic ROS: negative for - blurry vision, double vision, eye pain or loss of vision ENT ROS: negative for - epistaxis, nasal discharge, oral lesions, sore throat, tinnitus or vertigo Allergy and Immunology ROS: negative for - hives or itchy/watery eyes Hematological and Lymphatic ROS: negative for - bleeding problems, bruising or swollen lymph nodes Endocrine ROS: negative for - galactorrhea, hair pattern changes, polydipsia/polyuria or temperature intolerance Respiratory ROS: negative for - cough, hemoptysis, shortness of breath or wheezing Cardiovascular ROS: negative for - chest pain, dyspnea on exertion, edema or irregular heartbeat Gastrointestinal ROS: negative for - abdominal pain, diarrhea, hematemesis, nausea/vomiting or stool incontinence Genito-Urinary ROS: negative for - dysuria, hematuria, incontinence or urinary frequency/urgency Musculoskeletal ROS: back pain Neurological ROS: as noted in HPI Dermatological ROS: negative for rash and skin lesion changes  Physical Examination: Blood pressure 101/69, pulse 96, temperature 99.3 F (37.4 C), temperature source Oral, resp. rate 17, SpO2 98.00%.  Neurologic Examination Mental Status: Alert, oriented, thought content appropriate.  Speech fluent without evidence of aphasia.  Able to follow 3 step commands without difficulty. Cranial Nerves: II: Discs flat bilaterally; Visual fields grossly normal, pupils equal, round, reactive to light and accommodation III,IV, VI: ptosis not present, extra-ocular motions intact bilaterally V,VII: smile symmetric, facial light touch sensation normal bilaterally VIII: hearing normal bilaterally IX,X: gag reflex present  XI: bilateral shoulder shrug XII: midline tongue extension Motor: Right : Upper extremity   5/5    Left:     Upper  extremity   5/5  Lower extremity   1-2/5    Lower extremity   4-/5 hip flexion, 5/5 distally Tone and bulk:normal tone throughout; no atrophy noted Sensory: Pinprick and light touch markedly decreased on the left from the nipple downward.  Decreased less so on the right from just above the umbilicus downward Deep Tendon Reflexes: 2+ and symmetric.  Right ankle jerk not tested Plantars: Right: mute   Left: mute Cerebellar: normal finger-to-nose bilaterally Gait: unable to test CV: pulses palpable throughout   Laboratory Studies:   Basic Metabolic Panel:  Lab 04/16/12 2130  NA 138  K 3.8  CL 101  CO2 24  GLUCOSE 109*  BUN 15  CREATININE 0.88  CALCIUM 9.7  MG --  PHOS --    Liver Function Tests:  Lab 04/16/12 1818  AST 19  ALT 22  ALKPHOS 80  BILITOT 1.0  PROT 7.3  ALBUMIN 4.0   No results found for this basename: LIPASE:5,AMYLASE:5 in the last 168 hours No results found for this basename: AMMONIA:3 in the last 168 hours  CBC:  Lab 04/16/12 1818  WBC 5.9  NEUTROABS 4.3  HGB 15.1  HCT 44.3  MCV 85.9  PLT 170    Cardiac Enzymes: No results found for this basename: CKTOTAL:5,CKMB:5,CKMBINDEX:5,TROPONINI:5 in the last 168 hours  BNP: No components found with this basename: POCBNP:5  CBG: No results found for this basename: GLUCAP:5 in the last 168 hours  Microbiology: No results found for this or any previous visit.  Coagulation Studies: No results found for this basename: LABPROT:5,INR:5 in the last 72 hours  Urinalysis: No results found for this basename: COLORURINE:2,APPERANCEUR:2,LABSPEC:2,PHURINE:2,GLUCOSEU:2,HGBUR:2,BILIRUBINUR:2,KETONESUR:2,PROTEINUR:2,UROBILINOGEN:2,NITRITE:2,LEUKOCYTESUR:2 in the last 168 hours  Lipid Panel:  No results found for this basename: chol, trig, hdl, cholhdl, vldl, ldlcalc    HgbA1C:  No results found for this basename: HGBA1C    Urine Drug Screen:   No results found for this basename: labopia, cocainscrnur,  labbenz, amphetmu, thcu, labbarb    Alcohol Level: No results found for this basename: ETH:2 in the last 168 hours  Imaging: Dg Chest 2 View  04/16/2012  *RADIOLOGY REPORT*  Clinical Data: Numbness in legs.  Fall.  CHEST - 2 VIEW  Comparison: None.  Findings: Lateral views degraded by patient arm position.  Frontal view is mildly degraded by patient body habitus.  Decreased mid thoracic vertebral body height at numerous levels is favored to be within normal variation.  Midline trachea.  Normal heart size.  Mild left hemidiaphragm elevation. No pleural effusion or pneumothorax.  Clear lungs.  No free intraperitoneal air.  IMPRESSION: Mildly low lung volumes, without acute disease.   Original Report Authenticated By: Jeronimo Greaves, M.D.    Dg Ankle Complete Right  04/16/2012  *RADIOLOGY REPORT*  Clinical Data: Trauma with pain and numbness.  RIGHT ANKLE - COMPLETE 3+ VIEW  Comparison: None.  Findings: Minimally displaced oblique fracture of the distal fibula with overlying soft tissue swelling. Base of fifth metatarsal and talar dome intact.  There is osseous irregularity about the anterior tibia on the lateral view.  IMPRESSION: Distal fibular fracture.  Anterior tibial osseous irregularity, favored to be degenerative.   Original Report Authenticated By: Jeronimo Greaves, M.D.      Assessment/Plan: 52 year old male with RLE numbness/weakness and LLE numbness.  MRI of the cervical and thoracic spine  has been performed and shows a cervicothoracic intramedullary T2 hyperintense lesion at C5-6.  There is mild post contrast enhancement.  Differential is broad and includes MS, infection, transverse myelitis, and neoplasm.    Recommendations: 1.  LP with studies to further investigate above differential. 2.  ESR, lyme titer, ANA, ACE 3.  Once initial results reviewed to rule out infection would start steroids.  Solumedrol 500mg  IV q12 hours  4.  Frequent neuro checks  Thana Farr, MD [facility] [phone] 04/17/2012, 12:45 AM    Addendum:  LP procedure note Patient has been seen and examined.  Chart has been reviewed.  LP is being performed to rule out infection, MS, transverse myelitis.  Procedure has been explained to patient/family including risks and benefits.  Consent has been signed by patient and witnessed.  Patient was placed in the lateral decub position.  Area was cleaned with betadine and anesthetized with lidocaine.  Under sterile conditions 20G LP needle was placed at approximately L3-4 without difficulty.  Opening pressure was documented at 19.  Approximately 22cc of fluid that was initially bloody but cleared was obtained and sent for studies.  No complications were noted.    Thana Farr, MD [facility] [phone] 04/17/2012,  1:17 AM

## 2012-04-17 NOTE — Progress Notes (Signed)
Patient seen and examined. Admitted earlier today for a 1 week history of progressive weakness and numbness of his legs. MRI confirms acute transverse myelitis. Has been started on high-dose steroids. Have ordered B12/HIV/RPR/ANA/TSH/ESR/CRP. Will ask PT/OT to see. Neurology on board. Had 12 beats of NSVT earlier today. Will order 2D ECHO to evaluate LV function.  Peggye Pitt, MD [facility] Pager: [phone]

## 2012-04-17 NOTE — ED Notes (Signed)
Pt. Asked

## 2012-04-17 NOTE — Progress Notes (Signed)
Pt had 12 beat run Vtach.  VSS, pt asymp, Dr. Ardyth Harps notified via text page.  No new orders received.  Will continue to monitor closely. Ave Filter

## 2012-04-17 NOTE — Progress Notes (Signed)
Rehab Admissions Coordinator Note:  Patient was screened by Clois Dupes for appropriateness for an Inpatient Acute Rehab Consult.  At this time, we are recommending Inpatient Rehab consult.  Clois Dupes 04/17/2012, 5:18 PM  I can be reached at [phone].

## 2012-04-18 DIAGNOSIS — G373 Acute transverse myelitis in demyelinating disease of central nervous system: Principal | ICD-10-CM

## 2012-04-18 DIAGNOSIS — I472 Ventricular tachycardia: Secondary | ICD-10-CM

## 2012-04-18 LAB — RPR: RPR Ser Ql: NONREACTIVE

## 2012-04-18 LAB — HIV ANTIBODY (ROUTINE TESTING W REFLEX): HIV: NONREACTIVE

## 2012-04-18 NOTE — Progress Notes (Signed)
[facility]             Progress Note   Subjective: No new complaints. Wife present and updated on plan of care. All questions answered.  Objective: Vital signs in last 24 hours: Temp:  [97.2 F (36.2 C)-98.2 F (36.8 C)] 98 F (36.7 C) (01/19 0537) Pulse Rate:  [66-107] 66  (01/19 0537) Resp:  [16-18] 18  (01/19 0537) BP: (122-147)/(70-87) 122/70 mmHg (01/19 0537) SpO2:  [97 %-99 %] 97 % (01/19 0537) Weight change:  Last BM Date: 04/13/12  Intake/Output from previous day: 01/18 0701 - 01/19 0700 In: 720 [P.O.:720] Out: 200 [Urine:200]     Physical Exam: General: Alert, awake, oriented x3, in no acute distress. HEENT: No bruits, no goiter. Heart: Regular rate and rhythm, without murmurs, rubs, gallops. Lungs: Clear to auscultation bilaterally. Abdomen: Soft, nontender, nondistended, positive bowel sounds. Extremities: No clubbing cyanosis or edema with positive pedal pulses.    Lab Results: Basic Metabolic Panel:  Basename 04/17/12 0914 04/16/12 1818  NA 135 138  K 4.0 3.8  CL 98 101  CO2 25 24  GLUCOSE 155* 109*  BUN 15 15  CREATININE 0.81 0.88  CALCIUM 9.9 9.7  MG 1.9 --  PHOS 2.6 --   Liver Function Tests:  [facility] 04/17/12 0914 04/16/12 1818  AST 18 19  ALT 21 22  ALKPHOS 83 80  BILITOT 1.4* 1.0  PROT 7.7 7.3  ALBUMIN 4.1 4.0   CBC:  Basename 04/17/12 0914 04/16/12 1818  WBC 8.9 5.9  NEUTROABS -- 4.3  HGB 15.0 15.1  HCT 43.5 44.3  MCV 85.8 85.9  PLT 188 170   Thyroid Function Tests:  Basename 04/17/12 0914  TSH 0.650  T4TOTAL --  FREET4 --  T3FREE --  THYROIDAB --   Anemia Panel:  Basename 04/17/12 1238  VITAMINB12 489  FOLATE --  FERRITIN --  TIBC --  IRON --  RETICCTPCT --    Basename 04/17/12 0201  COLORURINE AMBER*  LABSPEC 1.043*  PHURINE 5.0  GLUCOSEU NEGATIVE  HGBUR NEGATIVE  BILIRUBINUR NEGATIVE  KETONESUR 15*  PROTEINUR NEGATIVE  UROBILINOGEN 0.2  NITRITE NEGATIVE  LEUKOCYTESUR NEGATIVE     Recent Results (from the past 240 hour(s))  GRAM STAIN     Status: Normal   Collection Time   04/17/12 12:59 AM      Component Value Range Status Comment   Specimen Description CSF   Final    Special Requests Normal   Final    Gram Stain     Final    Value: CYTOSPUN     NO ORGANISMS SEEN     WBC PRESENT,BOTH PMN AND MONONUCLEAR     Results Called to: Earlene Plater [id number] @0158  Phyllis Ginger   Report Status 04/17/2012 FINAL   Final   CSF CULTURE     Status: Normal (Preliminary result)   Collection Time   04/17/12 12:59 AM      Component Value Range Status Comment   Specimen Description CSF   Final    Special Requests Normal   Final    Gram Stain     Final    Value: CYTOSPIN WBC PRESENT, PREDOMINANTLY MONONUCLEAR     NO ORGANISMS SEEN   Culture PENDING   Incomplete    Report Status PENDING   Incomplete     Studies/Results: Dg Chest 2 View  04/16/2012  *RADIOLOGY REPORT*  Clinical Data: Numbness in legs.  Fall.  CHEST - 2 VIEW  Comparison: None.  Findings: Lateral views degraded by patient arm position.  Frontal view is mildly degraded by patient body habitus.  Decreased mid thoracic vertebral body height at numerous levels is favored to be within normal variation.  Midline trachea.  Normal heart size.  Mild left hemidiaphragm elevation. No pleural effusion or pneumothorax.  Clear lungs.  No free intraperitoneal air.  IMPRESSION: Mildly low lung volumes, without acute disease.   Original Report Authenticated By: Jeronimo Greaves, M.D.    Dg Ankle Complete Right  04/16/2012  *RADIOLOGY REPORT*  Clinical Data: Trauma with pain and numbness.  RIGHT ANKLE - COMPLETE 3+ VIEW  Comparison: None.  Findings: Minimally displaced oblique fracture of the distal fibula with overlying soft tissue swelling. Base of fifth metatarsal and talar dome intact.  There is osseous irregularity about the anterior tibia on the lateral view.  IMPRESSION: Distal fibular fracture.  Anterior tibial osseous irregularity, favored  to be degenerative.   Original Report Authenticated By: Jeronimo Greaves, M.D.    Mr Cervical Spine W Wo Contrast  04/17/2012  *RADIOLOGY REPORT*  Clinical Data:  Gradual onset of ascending numbness.  Recent fall. Now with significant progressive weakness of the right lower extremity.  No bowel or bladder difficulty.  Abnormal cerebrospinal fluid with elevated protein, and cells.  MRI CERVICAL AND THORACIC SPINE WITHOUT AND WITH CONTRAST  Technique:  Multiplanar and multiecho pulse sequences of the cervical spine, to include the craniocervical junction and cervicothoracic junction, and the thoracic spine, were obtained without and with intravenous contrast.  Contrast: 20mL MULTIHANCE GADOBENATE DIMEGLUMINE 529 MG/ML IV SOLN  Comparison:  None.  MRI CERVICAL SPINE  Findings:  There is there is an intramedullary lower cervical process with prolonged T1 and T2 signal, centrally located within the cord.  The cord is diffusely enlarged from C5 downward into the thoracic region.  There is no syrinx or tonsillar herniation. Axial images demonstrate that the peripheral white matter fiber pathways are spared.  Post infusion, there is variable abnormal enhancement which tends to be eccentric posteriorly in the cervicothoracic region.  Axial images reveal a central protrusion at C5-6 which effaces the anterior subarachnoid space but does not compress the cord or exiting nerve roots.  There is mild annular bulging at C6-7.  At C7- T1 there is asymmetric uncinate spurring on the right.  IMPRESSION: Intrinsic intramedullary cord lesion with prolonged T2 signal and variable abnormal enhancement.  Findings most consistent with idiopathic acute transverse myelitis.  Other considerations such as cord neoplasm, or multiple sclerosis are less likely. Cord infarction is not favored, given the post contrast enhancement.  MRI THORACIC SPINE  Findings: As noted in the cervical region, there is an intramedullary upper thoracic cord lesion  with prolonged T1 and T2 signal located centrally within the cord.  This is continuous with the cervical lesion extending from C5 downward as far as the T6 vertebral body, below which the cord assumes a more normal size. The lesion is solid, expands the cord, and does not represent a dilated central canal.  Post infusion imaging demonstrates variable enhancement which tends to be peripheral.  There is no subarachnoid, subdural, or epidural extramedullary lesion.  Axial images through the thoracic disc spaces demonstrate minor disc pathology which is non compressive.  This can be seen at T1-2 on the left,  T2-T3 on the left, T3-4 on the right, and T4-5 on the right.  No vertebral body abnormality is seen.  No paraspinous enhancement or pleural effusion is noted.  IMPRESSION:  Intrinsic  intramedullary cord lesion with prolonged T2 signal and variable abnormal enhancement.  Findings consistent with idiopathic acute transverse myelitis.  Other considerations such as cord neoplasm, or multiple sclerosis are less likely.  I discussed the findings with Dr. Ignacia Palma, EDP, shortly after completion of the study.   Original Report Authenticated By: Davonna Belling, M.D.    Mr Thoracic Spine W Wo Contrast  04/17/2012  *RADIOLOGY REPORT*  Clinical Data:  Gradual onset of ascending numbness.  Recent fall. Now with significant progressive weakness of the right lower extremity.  No bowel or bladder difficulty.  Abnormal cerebrospinal fluid with elevated protein, and cells.  MRI CERVICAL AND THORACIC SPINE WITHOUT AND WITH CONTRAST  Technique:  Multiplanar and multiecho pulse sequences of the cervical spine, to include the craniocervical junction and cervicothoracic junction, and the thoracic spine, were obtained without and with intravenous contrast.  Contrast: 20mL MULTIHANCE GADOBENATE DIMEGLUMINE 529 MG/ML IV SOLN  Comparison:  None.  MRI CERVICAL SPINE  Findings:  There is there is an intramedullary lower cervical process with  prolonged T1 and T2 signal, centrally located within the cord.  The cord is diffusely enlarged from C5 downward into the thoracic region.  There is no syrinx or tonsillar herniation. Axial images demonstrate that the peripheral white matter fiber pathways are spared.  Post infusion, there is variable abnormal enhancement which tends to be eccentric posteriorly in the cervicothoracic region.  Axial images reveal a central protrusion at C5-6 which effaces the anterior subarachnoid space but does not compress the cord or exiting nerve roots.  There is mild annular bulging at C6-7.  At C7- T1 there is asymmetric uncinate spurring on the right.  IMPRESSION: Intrinsic intramedullary cord lesion with prolonged T2 signal and variable abnormal enhancement.  Findings most consistent with idiopathic acute transverse myelitis.  Other considerations such as cord neoplasm, or multiple sclerosis are less likely. Cord infarction is not favored, given the post contrast enhancement.  MRI THORACIC SPINE  Findings: As noted in the cervical region, there is an intramedullary upper thoracic cord lesion with prolonged T1 and T2 signal located centrally within the cord.  This is continuous with the cervical lesion extending from C5 downward as far as the T6 vertebral body, below which the cord assumes a more normal size. The lesion is solid, expands the cord, and does not represent a dilated central canal.  Post infusion imaging demonstrates variable enhancement which tends to be peripheral.  There is no subarachnoid, subdural, or epidural extramedullary lesion.  Axial images through the thoracic disc spaces demonstrate minor disc pathology which is non compressive.  This can be seen at T1-2 on the left,  T2-T3 on the left, T3-4 on the right, and T4-5 on the right.  No vertebral body abnormality is seen.  No paraspinous enhancement or pleural effusion is noted.  IMPRESSION:  Intrinsic intramedullary cord lesion with prolonged T2 signal and  variable abnormal enhancement.  Findings consistent with idiopathic acute transverse myelitis.  Other considerations such as cord neoplasm, or multiple sclerosis are less likely.  I discussed the findings with Dr. Ignacia Palma, EDP, shortly after completion of the study.   Original Report Authenticated By: Davonna Belling, M.D.     Medications: Scheduled Meds:   . allopurinol  300 mg Oral Daily  . amLODipine  5 mg Oral Daily   And  . irbesartan  150 mg Oral Daily  . docusate sodium  100 mg Oral BID  . methylPREDNISolone (SOLU-MEDROL) injection  500 mg Intravenous Q12H  .  pantoprazole  40 mg Oral Daily  . sodium chloride  3 mL Intravenous Q12H   Continuous Infusions:  PRN Meds:.acetaminophen, acetaminophen, HYDROcodone-acetaminophen, HYDROcodone-acetaminophen, magnesium hydroxide, morphine injection, ondansetron (ZOFRAN) IV, ondansetron  Assessment/Plan:  Principal Problem:  *Acute transverse myelitis Active Problems:  Lower paraplegia  NSVT (nonsustained ventricular tachycardia)    Acute Transverse Myelitis -Continue high dose steroids. -Have placed CIR consult as per PT recs.  NSVT -2D ECHO pending. -If LV function ok, no further work up. (could have been steroid-induced).   Time spent coordinating care: 35 minutes.   LOS: 2 days   [facility] Pager: [phone] 04/18/2012, 11:01 AM

## 2012-04-18 NOTE — Progress Notes (Signed)
NEURO HOSPITALIST PROGRESS NOTE   SUBJECTIVE:                                                                                                                        In good spirits.  Stated that the left left moves well but still numb, and the right leg weakness is not really improved. Still no bowel movement despite stool softeners and enema. No other neurological complains. IV solumedrol.   OBJECTIVE:                                                                                                                           Vital signs in last 24 hours: Temp:  [97.4 F (36.3 C)-98.2 F (36.8 C)] 97.4 F (36.3 C) (01/19 1500) Pulse Rate:  [66-117] 117  (01/19 1500) Resp:  [16-18] 16  (01/19 1500) BP: (122-147)/(70-87) 141/84 mmHg (01/19 1500) SpO2:  [95 %-98 %] 95 % (01/19 1500)  Intake/Output from previous day: 01/18 0701 - 01/19 0700 In: 720 [P.O.:720] Out: 200 [Urine:200] Intake/Output this shift: Total I/O In: 240 [P.O.:240] Out: -  Nutritional status: Cardiac  Past Medical History  Diagnosis Date  . Chronic back pain   . Hypertension   . Gout     Neurologic ROS negative with exception of above. Musculoskeletal ROS: right foot discomfort.  Neurologic Exam:  Patient's neurological exam is unchanged.  Lab Results: No results found for this basename: cbc, bmp, coags, chol, tri, ldl, hga1c   Lipid Panel No results found for this basename: CHOL,TRIG,HDL,CHOLHDL,VLDL,LDLCALC in the last 72 hours  Studies/Results: Dg Chest 2 View  04/16/2012  *RADIOLOGY REPORT*  Clinical Data: Numbness in legs.  Fall.  CHEST - 2 VIEW  Comparison: None.  Findings: Lateral views degraded by patient arm position.  Frontal view is mildly degraded by patient body habitus.  Decreased mid thoracic vertebral body height at numerous levels is favored to be within normal variation.  Midline trachea.  Normal heart size.  Mild left hemidiaphragm elevation. No  pleural effusion or pneumothorax.  Clear lungs.  No free intraperitoneal air.  IMPRESSION: Mildly low lung volumes, without acute disease.   Original Report Authenticated By: Jeronimo Greaves, M.D.    Dg Ankle Complete Right  04/16/2012  *  RADIOLOGY REPORT*  Clinical Data: Trauma with pain and numbness.  RIGHT ANKLE - COMPLETE 3+ VIEW  Comparison: None.  Findings: Minimally displaced oblique fracture of the distal fibula with overlying soft tissue swelling. Base of fifth metatarsal and talar dome intact.  There is osseous irregularity about the anterior tibia on the lateral view.  IMPRESSION: Distal fibular fracture.  Anterior tibial osseous irregularity, favored to be degenerative.   Original Report Authenticated By: Jeronimo Greaves, M.D.    Mr Cervical Spine W Wo Contrast  04/17/2012  *RADIOLOGY REPORT*  Clinical Data:  Gradual onset of ascending numbness.  Recent fall. Now with significant progressive weakness of the right lower extremity.  No bowel or bladder difficulty.  Abnormal cerebrospinal fluid with elevated protein, and cells.  MRI CERVICAL AND THORACIC SPINE WITHOUT AND WITH CONTRAST  Technique:  Multiplanar and multiecho pulse sequences of the cervical spine, to include the craniocervical junction and cervicothoracic junction, and the thoracic spine, were obtained without and with intravenous contrast.  Contrast: 20mL MULTIHANCE GADOBENATE DIMEGLUMINE 529 MG/ML IV SOLN  Comparison:  None.  MRI CERVICAL SPINE  Findings:  There is there is an intramedullary lower cervical process with prolonged T1 and T2 signal, centrally located within the cord.  The cord is diffusely enlarged from C5 downward into the thoracic region.  There is no syrinx or tonsillar herniation. Axial images demonstrate that the peripheral white matter fiber pathways are spared.  Post infusion, there is variable abnormal enhancement which tends to be eccentric posteriorly in the cervicothoracic region.  Axial images reveal a central  protrusion at C5-6 which effaces the anterior subarachnoid space but does not compress the cord or exiting nerve roots.  There is mild annular bulging at C6-7.  At C7- T1 there is asymmetric uncinate spurring on the right.  IMPRESSION: Intrinsic intramedullary cord lesion with prolonged T2 signal and variable abnormal enhancement.  Findings most consistent with idiopathic acute transverse myelitis.  Other considerations such as cord neoplasm, or multiple sclerosis are less likely. Cord infarction is not favored, given the post contrast enhancement.  MRI THORACIC SPINE  Findings: As noted in the cervical region, there is an intramedullary upper thoracic cord lesion with prolonged T1 and T2 signal located centrally within the cord.  This is continuous with the cervical lesion extending from C5 downward as far as the T6 vertebral body, below which the cord assumes a more normal size. The lesion is solid, expands the cord, and does not represent a dilated central canal.  Post infusion imaging demonstrates variable enhancement which tends to be peripheral.  There is no subarachnoid, subdural, or epidural extramedullary lesion.  Axial images through the thoracic disc spaces demonstrate minor disc pathology which is non compressive.  This can be seen at T1-2 on the left,  T2-T3 on the left, T3-4 on the right, and T4-5 on the right.  No vertebral body abnormality is seen.  No paraspinous enhancement or pleural effusion is noted.  IMPRESSION:  Intrinsic intramedullary cord lesion with prolonged T2 signal and variable abnormal enhancement.  Findings consistent with idiopathic acute transverse myelitis.  Other considerations such as cord neoplasm, or multiple sclerosis are less likely.  I discussed the findings with Dr. Ignacia Palma, EDP, shortly after completion of the study.   Original Report Authenticated By: Davonna Belling, M.D.    Mr Thoracic Spine W Wo Contrast  04/17/2012  *RADIOLOGY REPORT*  Clinical Data:  Gradual onset  of ascending numbness.  Recent fall. Now with significant progressive weakness of  the right lower extremity.  No bowel or bladder difficulty.  Abnormal cerebrospinal fluid with elevated protein, and cells.  MRI CERVICAL AND THORACIC SPINE WITHOUT AND WITH CONTRAST  Technique:  Multiplanar and multiecho pulse sequences of the cervical spine, to include the craniocervical junction and cervicothoracic junction, and the thoracic spine, were obtained without and with intravenous contrast.  Contrast: 20mL MULTIHANCE GADOBENATE DIMEGLUMINE 529 MG/ML IV SOLN  Comparison:  None.  MRI CERVICAL SPINE  Findings:  There is there is an intramedullary lower cervical process with prolonged T1 and T2 signal, centrally located within the cord.  The cord is diffusely enlarged from C5 downward into the thoracic region.  There is no syrinx or tonsillar herniation. Axial images demonstrate that the peripheral white matter fiber pathways are spared.  Post infusion, there is variable abnormal enhancement which tends to be eccentric posteriorly in the cervicothoracic region.  Axial images reveal a central protrusion at C5-6 which effaces the anterior subarachnoid space but does not compress the cord or exiting nerve roots.  There is mild annular bulging at C6-7.  At C7- T1 there is asymmetric uncinate spurring on the right.  IMPRESSION: Intrinsic intramedullary cord lesion with prolonged T2 signal and variable abnormal enhancement.  Findings most consistent with idiopathic acute transverse myelitis.  Other considerations such as cord neoplasm, or multiple sclerosis are less likely. Cord infarction is not favored, given the post contrast enhancement.  MRI THORACIC SPINE  Findings: As noted in the cervical region, there is an intramedullary upper thoracic cord lesion with prolonged T1 and T2 signal located centrally within the cord.  This is continuous with the cervical lesion extending from C5 downward as far as the T6 vertebral body, below  which the cord assumes a more normal size. The lesion is solid, expands the cord, and does not represent a dilated central canal.  Post infusion imaging demonstrates variable enhancement which tends to be peripheral.  There is no subarachnoid, subdural, or epidural extramedullary lesion.  Axial images through the thoracic disc spaces demonstrate minor disc pathology which is non compressive.  This can be seen at T1-2 on the left,  T2-T3 on the left, T3-4 on the right, and T4-5 on the right.  No vertebral body abnormality is seen.  No paraspinous enhancement or pleural effusion is noted.  IMPRESSION:  Intrinsic intramedullary cord lesion with prolonged T2 signal and variable abnormal enhancement.  Findings consistent with idiopathic acute transverse myelitis.  Other considerations such as cord neoplasm, or multiple sclerosis are less likely.  I discussed the findings with Dr. Ignacia Palma, EDP, shortly after completion of the study.   Original Report Authenticated By: Davonna Belling, M.D.     MEDICATIONS                                                                                                                       I have reviewed the patient's current medications.  ASSESSMENT/PLAN:  Transverse Myelitis of unclear etiology. Will complete 3 days of solumedrol and then decide about a trial of IVIG or plasmapheresis if no improvement. Awaiting labs. Will follow up.  Wyatt Portela, MD [facility] [phone]  04/18/2012, 5:31 PM

## 2012-04-19 DIAGNOSIS — S82891A Other fracture of right lower leg, initial encounter for closed fracture: Secondary | ICD-10-CM

## 2012-04-19 DIAGNOSIS — G373 Acute transverse myelitis in demyelinating disease of central nervous system: Secondary | ICD-10-CM

## 2012-04-19 HISTORY — DX: Other fracture of right lower leg, initial encounter for closed fracture: S82.891A

## 2012-04-19 LAB — B. BURGDORFI ANTIBODIES: B burgdorferi Ab IgG+IgM: 0.56 {ISR}

## 2012-04-19 LAB — ANTI-NUCLEAR AB-TITER (ANA TITER): ANA Titer 1: 1:40 {titer} — ABNORMAL HIGH

## 2012-04-19 MED ORDER — FLEET ENEMA 7-19 GM/118ML RE ENEM
1.0000 | ENEMA | Freq: Once | RECTAL | Status: AC
  Administered 2012-04-19: 1 via RECTAL
  Filled 2012-04-19: qty 1

## 2012-04-19 NOTE — Progress Notes (Signed)
Physical Therapy Treatment Patient Details Name: Matthew Juarez MRN: [id number] DOB: October 29, 1960 Today's Date: 04/19/2012 Time: 0921-0947 PT Time Calculation (min): 26 min  PT Assessment / Plan / Recommendation Comments on Treatment Session  pt presents with Bil LE weakness and R distal fibula fx.  pt very motivated to work on mobility.  Discussed with RN use of lift for return to bed.  pt would benefit from working on sliding board or other lateral scoot transfers next session.      Follow Up Recommendations  CIR     Does the patient have the potential to tolerate intense rehabilitation     Barriers to Discharge        Equipment Recommendations  None recommended by PT    Recommendations for Other Services Rehab consult  Frequency Min 4X/week   Plan Discharge plan remains appropriate;Frequency remains appropriate    Precautions / Restrictions Precautions Precautions: Fall Precaution Comments: loss of sensation or proprioception Restrictions Weight Bearing Restrictions: Yes RLE Weight Bearing: Non weight bearing   Pertinent Vitals/Pain Denies pain.    Mobility  Bed Mobility Bed Mobility: Supine to Sit;Sitting - Scoot to Edge of Bed Supine to Sit: 4: Min assist Sitting - Scoot to Edge of Bed: 4: Min assist Details for Bed Mobility Assistance: cues for sequencing and safe technique.  A with R LE movement. Transfers Transfers: Sit to Stand;Stand to Sit;Lateral/Scoot Transfers Sit to Stand: 1: +2 Total assist;With upper extremity assist;From bed;From elevated surface Sit to Stand: Patient Percentage: 40% Stand to Sit: 1: +2 Total assist;With upper extremity assist;To bed;To elevated surface Stand to Sit: Patient Percentage: 30% Lateral/Scoot Transfers: 1: +2 Total assist;From elevated surface Lateral Transfers: Patient Percentage: 50% Details for Transfer Assistance: cues for use of UEs, positioning of L LE, NWBing on R LE.  pt needs L LE blocked to prevent knee buckling,  cues and facilitation for hip/trunk extension.  pt tends to lean towards R side.  Performed LAteral scoot using pad under pt's bottom to scoot to chair.  Would benefit from drop arm furniture and try slide board next time.   Ambulation/Gait Ambulation/Gait Assistance: Not tested (comment) Stairs: No Wheelchair Mobility Wheelchair Mobility: No    Exercises     PT Diagnosis:    PT Problem List:   PT Treatment Interventions:     PT Goals Acute Rehab PT Goals Time For Goal Achievement: 05/01/12 Potential to Achieve Goals: Good PT Goal: Supine/Side to Sit - Progress: Progressing toward goal PT Goal: Sit to Stand - Progress: Progressing toward goal PT Goal: Stand to Sit - Progress: Progressing toward goal PT Transfer Goal: Bed to Chair/Chair to Bed - Progress: Progressing toward goal Additional Goals PT Goal: Additional Goal #1 - Progress: Progressing toward goal  Visit Information  Last PT Received On: 04/19/12 Assistance Needed: +2    Subjective Data  Subjective: I really want to go to rehab.     Cognition  Overall Cognitive Status: Appears within functional limits for tasks assessed/performed Arousal/Alertness: Awake/alert Orientation Level: Oriented X4 / Intact Behavior During Session: WFL for tasks performed    Balance  Balance Balance Assessed: Yes Dynamic Sitting Balance Dynamic Sitting - Balance Support: Bilateral upper extremity supported;Feet supported;During functional activity Dynamic Sitting - Level of Assistance: 4: Min assist Dynamic Sitting - Comments: pt able to prop on each elbow in order to perform peri hygiene with total A for hygiene.  pt with decrease trunk control and occasionally sways anteriorly and posteriorly.    End of  Session PT - End of Session Equipment Utilized During Treatment: Gait belt Activity Tolerance: Patient tolerated treatment well Patient left: in chair;with call bell/phone within reach Nurse Communication: Need for lift equipment    GP     Rajesh Wyss, Alison Murray, [place] [phone] 04/19/2012, 11:06 AM

## 2012-04-19 NOTE — Progress Notes (Signed)
Nutrition Brief Note  Patient identified on the < 12 Braden score report.  Body mass index is 34.93 kg/(m^2). Patient meets criteria for Obesity Class I based on current BMI.   Current diet order is Heart Healthy, patient is consuming approximately 100% of meals at this time.  Labs and medications reviewed.   No nutrition interventions warranted at this time.  Please consult RD as needed.  Maureen Chatters, RD, LDN Pager #: [phone] After-Hours Pager #: [phone]

## 2012-04-19 NOTE — Progress Notes (Signed)
OT Cancellation Note  Patient Details Name: Matthew Juarez MRN: [id number] DOB: [birth year]   Cancelled Treatment:    Reason Eval/Treat Not Completed: Patient at procedure or test/ unavailable (vasular lab)  [facility] [address], OTR/L  [phone] 04/19/2012 04/19/2012, 2:09 PM

## 2012-04-19 NOTE — Progress Notes (Signed)
2D Echo performed  04/19/2012

## 2012-04-19 NOTE — Progress Notes (Addendum)
Met with patient and his family at bedside to discuss CIR. Pt would benefit from inpatient rehab prior to discharge to home. Pt and family are in agreement with plan to come to rehab when he is medically ready. They understand that pt's insurance will need to approve CIR. [employer] is closed for holiday today; will begin authorization process in AM. For questions, call [phone].

## 2012-04-19 NOTE — Progress Notes (Signed)
Utilization Review Completed.Matthew Juarez T1/20/2014   

## 2012-04-19 NOTE — Progress Notes (Signed)
Plan for splint removal and casing of ankle wednesday

## 2012-04-19 NOTE — Consult Note (Signed)
Physical Medicine and Rehabilitation Consult Reason for Consult: Transverse myelitis of unclear etiology Referring Physician: [facility]   HPI: Matthew Juarez is a 52 y.o. right-handed male with history of hypertension as well as chronic back pain. Admitted 04/17/2012 with progressive lower extremity weakness x5 days. He denied any chills or fever. He denied any urinary incontinence but had bouts of constipation. MRI spine showed intrinsic intramedullary cord lesion with prolonged T2 signal and variable abnormal enhancement. Findings most consistent with idiopathic acute transverse myelitis. Neurology services consulted and patient placed on intravenous Solu-Medrol. Awaiting CSF results. Plan 3 days of Solu-Medrol and then decide about a trial of IVIG or plasmapheresis if no improvement. Echocardiogram is pending. Physical therapy evaluation completed 04/17/2012 with occupational therapy pending. Recommendations have been made for physical medicine rehabilitation consult to consider inpatient rehabilitation services   Review of Systems  Gastrointestinal: Positive for constipation.  Genitourinary:       Constipation  Musculoskeletal: Positive for myalgias, back pain and joint pain.  Neurological: Positive for tingling and weakness.  All other systems reviewed and are negative.   Past Medical History  Diagnosis Date  . Chronic back pain   . Hypertension   . Gout    History reviewed. No pertinent past surgical history. Family History  Problem Relation Age of Onset  . Lung cancer Maternal Uncle   . Colon cancer Maternal Aunt    Social History:  reports that he has never smoked. He does not have any smokeless tobacco history on file. He reports that he does not drink alcohol or use illicit drugs. Allergies: No Known Allergies Medications Prior to Admission  Medication Sig Dispense Refill  . allopurinol (ZYLOPRIM) 300 MG tablet Take 300 mg by mouth daily.      Marland Kitchen amLODipine-olmesartan (AZOR)  5-20 MG per tablet Take 1 tablet by mouth daily.      . Cholecalciferol (VITAMIN D PO) Take 1 tablet by mouth daily.      . Coenzyme Q10 (CO Q-10) 200 MG CAPS Take 1 capsule by mouth daily.      . diphenhydramine-acetaminophen (TYLENOL PM) 25-500 MG TABS Take 1 tablet by mouth at bedtime.        Home: Home Living Lives With: Spouse Available Help at Discharge: Other (Comment) (family works) Type of Home: House Home Access: Stairs to enter Secretary/administrator of Steps: 5 Entrance Stairs-Rails: Right;Left Home Layout: One level Bathroom Shower/Tub: Tub/shower unit;Tub only;Door [employer] Toilet: Standard Bathroom Accessibility: Yes How Accessible: Accessible via walker;Accessible via wheelchair (only to the front area of the bathroom) Home Adaptive Equipment: None (w/c too small for him)  Functional History: Prior Function Able to Take Stairs?: Yes Driving: Yes Vocation: Full time employment Functional Status:  Mobility: Bed Mobility Bed Mobility: Sitting - Scoot to Edge of Bed;Sit to Supine;Scooting to [facility] Sitting - Scoot to Edge of Bed: 4: Min guard;Other (comment) (scooting away from EOB) Sit to Supine: 4: Min assist;HOB flat;Other (comment) (at LE's) Scooting to [facility]: 4: Min assist;Other (comment) (to position his L LE) Transfers Transfers: Heritage manager Transfers: 3: Mod assist;With upper extremity assistance Ambulation/Gait Ambulation/Gait Assistance: Not tested (comment) Stairs: No Wheelchair Mobility Wheelchair Mobility: No  ADL:    Cognition: Cognition Arousal/Alertness: Awake/alert Orientation Level: Oriented X4 Cognition Overall Cognitive Status: Appears within functional limits for tasks assessed/performed Arousal/Alertness: Awake/alert Orientation Level: Oriented X4 / Intact Behavior During Session: [facility] for tasks performed  Blood pressure 141/84, pulse 88, temperature 97.4 F (36.3 C), temperature source Oral,  resp. rate 18,  height 6\' 5"  (1.956 m), weight 133.6 kg (294 lb 8.6 oz), SpO2 95.00%. Physical Exam  Vitals reviewed. Constitutional: He is oriented to person, place, and time. He appears well-developed.  HENT:  Head: Normocephalic.  Eyes:       Pupils round and reactive to light  Neck: Normal range of motion. Neck supple. No thyromegaly present.  Cardiovascular: Normal rate and regular rhythm.   Pulmonary/Chest: Effort normal and breath sounds normal. No respiratory distress.  Abdominal: Soft. Bowel sounds are normal. He exhibits no distension. There is no tenderness.  Musculoskeletal:       Right ankle in splint  Neurological: He is alert and oriented to person, place, and time.       Follows full commands. T8 sensory level. Trace to absent sensation on the left below level, slightly diminished sense to PP/LT on right. RLE is 1 prox to tr-1 distally at the ankle. LLE is 3+ to 4/5. UE's are 5/5. Cognitively intact. DTR's are 2+.  Skin: Skin is warm and dry.  Psychiatric: He has a normal mood and affect. His behavior is normal. Judgment and thought content normal.    No results found for this or any previous visit (from the past 24 hour(s)). No results found.  Assessment/Plan: Diagnosis: Cervico-thoracic transverse myelitis, clinically T8 with Tally Joe presentation/ right distal fibular fx 1. Does the need for close, 24 hr/day medical supervision in concert with the patient's rehab needs make it unreasonable for this patient to be served in a less intensive setting? Yes 2. Co-Morbidities requiring supervision/potential complications: neurogenic bowel 3. Due to bladder management, bowel management, safety, skin/wound care, disease management, medication administration, pain management and patient education, does the patient require 24 hr/day rehab nursing? Yes 4. Does the patient require coordinated care of a physician, rehab nurse, PT (1-2 hrs/day, 5 days/week) and OT (1-2 hrs/day, 5 days/week) to  address physical and functional deficits in the context of the above medical diagnosis(es)? Yes Addressing deficits in the following areas: balance, endurance, locomotion, strength, transferring, bowel/bladder control, bathing, dressing, feeding, grooming, toileting and psychosocial support 5. Can the patient actively participate in an intensive therapy program of at least 3 hrs of therapy per day at least 5 days per week? Yes 6. The potential for patient to make measurable gains while on inpatient rehab is excellent 7. Anticipated functional outcomes upon discharge from inpatient rehab are mod I wheelchair level with PT, mod I to set up at wheelchair level with OT, n/a with SLP. 8. Estimated rehab length of stay to reach the above functional goals is: 2-3 weeks 9. Does the patient have adequate social supports to accommodate these discharge functional goals? Yes 10. Anticipated D/C setting: Home 11. Anticipated post D/C treatments: [facility] therapy/ outpatient therapy 12. Overall Rehab/Functional Prognosis: excellent  RECOMMENDATIONS: This patient's condition is appropriate for continued rehabilitative care in the following setting: CIR Patient has agreed to participate in recommended program. Yes Note that insurance prior authorization may be required for reimbursement for recommended care.  Comment:Rehab RN to follow up.   Ivory Broad, MD     04/19/2012

## 2012-04-19 NOTE — Progress Notes (Signed)
[facility]             Progress Note   Subjective: No new complaints.  All questions answered.  Objective: Vital signs in last 24 hours: Temp:  [97.4 F (36.3 C)] 97.4 F (36.3 C) (01/20 0325) Pulse Rate:  [88-117] 88  (01/20 0325) Resp:  [16-18] 18  (01/20 0325) BP: (141)/(84) 141/84 mmHg (01/19 1500) SpO2:  [95 %] 95 % (01/20 0325) Weight change:  Last BM Date: 04/13/12  Intake/Output from previous day: 01/19 0701 - 01/20 0700 In: 480 [P.O.:480] Out: 1250 [Urine:1250]     Physical Exam: General: Alert, awake, oriented x3, in no acute distress. HEENT: No bruits, no goiter. Heart: Regular rate and rhythm, without murmurs, rubs, gallops. Lungs: Clear to auscultation bilaterally. Abdomen: Soft, nontender, nondistended, positive bowel sounds. Extremities: No clubbing cyanosis or edema with positive pedal pulses. Right leg in splint.    Lab Results: Basic Metabolic Panel:  Basename 04/17/12 0914 04/16/12 1818  NA 135 138  K 4.0 3.8  CL 98 101  CO2 25 24  GLUCOSE 155* 109*  BUN 15 15  CREATININE 0.81 0.88  CALCIUM 9.9 9.7  MG 1.9 --  PHOS 2.6 --   Liver Function Tests:  [facility] 04/17/12 0914 04/16/12 1818  AST 18 19  ALT 21 22  ALKPHOS 83 80  BILITOT 1.4* 1.0  PROT 7.7 7.3  ALBUMIN 4.1 4.0   CBC:  Basename 04/17/12 0914 04/16/12 1818  WBC 8.9 5.9  NEUTROABS -- 4.3  HGB 15.0 15.1  HCT 43.5 44.3  MCV 85.8 85.9  PLT 188 170   Thyroid Function Tests:  Basename 04/17/12 0914  TSH 0.650  T4TOTAL --  FREET4 --  T3FREE --  THYROIDAB --   Anemia Panel:  Basename 04/17/12 1238  VITAMINB12 489  FOLATE --  FERRITIN --  TIBC --  IRON --  RETICCTPCT --    Basename 04/17/12 0201  COLORURINE AMBER*  LABSPEC 1.043*  PHURINE 5.0  GLUCOSEU NEGATIVE  HGBUR NEGATIVE  BILIRUBINUR NEGATIVE  KETONESUR 15*  PROTEINUR NEGATIVE  UROBILINOGEN 0.2  NITRITE NEGATIVE  LEUKOCYTESUR NEGATIVE    Recent Results (from the past 240  hour(s))  GRAM STAIN     Status: Normal   Collection Time   04/17/12 12:59 AM      Component Value Range Status Comment   Specimen Description CSF   Final    Special Requests Normal   Final    Gram Stain     Final    Value: CYTOSPUN     NO ORGANISMS SEEN     WBC PRESENT,BOTH PMN AND MONONUCLEAR     Results Called to: Earlene Plater [id number] @0158  Phyllis Ginger   Report Status 04/17/2012 FINAL   Final   CSF CULTURE     Status: Normal (Preliminary result)   Collection Time   04/17/12 12:59 AM      Component Value Range Status Comment   Specimen Description CSF   Final    Special Requests Normal   Final    Gram Stain     Final    Value: CYTOSPIN WBC PRESENT, PREDOMINANTLY MONONUCLEAR     NO ORGANISMS SEEN   Culture NO GROWTH 2 DAYS   Final    Report Status PENDING   Incomplete   FUNGUS CULTURE W SMEAR     Status: Normal (Preliminary result)   Collection Time   04/17/12 12:59 AM      Component Value Range Status Comment  Specimen Description CSF   Final    Special Requests NONE   Final    Fungal Smear NO YEAST OR FUNGAL ELEMENTS SEEN   Final    Culture CULTURE IN PROGRESS FOR FOUR WEEKS   Final    Report Status PENDING   Incomplete     Studies/Results: No results found.  Medications: Scheduled Meds:    . allopurinol  300 mg Oral Daily  . amLODipine  5 mg Oral Daily   And  . irbesartan  150 mg Oral Daily  . docusate sodium  100 mg Oral BID  . methylPREDNISolone (SOLU-MEDROL) injection  500 mg Intravenous Q12H  . pantoprazole  40 mg Oral Daily  . sodium chloride  3 mL Intravenous Q12H  . sodium phosphate  1 enema Rectal Once   Continuous Infusions:  PRN Meds:.acetaminophen, acetaminophen, HYDROcodone-acetaminophen, HYDROcodone-acetaminophen, magnesium hydroxide, morphine injection, ondansetron (ZOFRAN) IV, ondansetron  Assessment/Plan:  Principal Problem:  *Acute transverse myelitis Active Problems:  Lower paraplegia  NSVT (nonsustained ventricular tachycardia)  Closed right  ankle fracture    Acute Transverse Myelitis -Continue high dose steroids. -Have placed CIR consult.  NSVT -2D ECHO pending. -If LV function ok, no further work up. (could have been steroid-induced).  Right Ankle Fracture -As per Dr. August Saucer, plan is for a cast on Wednesday.   Time spent coordinating care: 35 minutes.   LOS: 3 days   [facility] Pager: [phone] 04/19/2012, 2:08 PM

## 2012-04-19 NOTE — Progress Notes (Signed)
Occupational Therapy Evaluation Patient Details Name: Matthew Juarez MRN: [id number] DOB: [birth year] Today's Date: 04/19/2012 Time: 1720-1759 OT Time Calculation (min): 39 min  OT Assessment / Plan / Recommendation Clinical Impression  52 yo with 5 day history of progressive numbness and weakness of BLE. Pt subsequently fell and suffered minimally displaced lat malleoli fx R ankle. Dx - ? transverse myelitis. Pt presents with deficits in the mid back and sacral levels. On the R, pt demonstrates deficits in the T10 - S1 dermatome and L2 and below myotome. On the L, pt demonstrates deficits @ T6 dermatome with absent sensation below. Myotome @ L2, but strength is @ 4/5 throughtout. Pt reports continence of urine, but "weakness in stream". PTA, pt independent and worked full time as Chartered certified accountant. Pt will benefit from rehab at [facility] to return pt to mod I level to D/C home with supportive family. Pt is extrememly motivated. Pt will benefit from skilled OT services to max indpendence with ADL and functional mobility for ADL to facilitate D/C to next venue of care due to below deficits.    OT Assessment  Patient needs continued OT Services    Follow Up Recommendations  [facility]    Barriers to Discharge None family able to build ramp  Equipment Recommendations  3 in 1 bedside comode;Tub/shower bench;Wheelchair (measurements OT);Wheelchair cushion (measurements OT)    Recommendations for Other Services Rehab consult  Frequency  Min 3X/week    Precautions / Restrictions Precautions Precautions: Fall Precaution Comments: loss of sensation or proprioception Required Braces or Orthoses: Other Brace/Splint (R ankle) Restrictions RLE Weight Bearing: Non weight bearing (R akle fracture) LLE Weight Bearing: Weight bearing as tolerated   Pertinent Vitals/Pain No c/o pain    ADL  Eating/Feeding: Independent Where Assessed - Eating/Feeding: Chair Grooming: Set up Where Assessed - Grooming: Supported  sitting Upper Body Bathing: Set up Where Assessed - Upper Body Bathing: Supported sitting Lower Body Bathing: +1 Total assistance Where Assessed - Lower Body Bathing: Supported sitting;Lean right and/or left Upper Body Dressing: Minimal assistance Where Assessed - Upper Body Dressing: Supported sitting Lower Body Dressing: +1 Total assistance Where Assessed - Lower Body Dressing: Lean right and/or left;Supported sitting Toilet Transfer: +2 Total assistance Toilet Transfer Method: Other (comment) (simulated) Toilet Transfer Equipment: Other (comment) (chair) Toileting - Clothing Manipulation and Hygiene: +1 Total assistance Where Assessed - Glass blower/designer Manipulation and Hygiene: Lean right and/or left Tub/Shower Transfer: +2 Total assistance Equipment Used: Gait belt Transfers/Ambulation Related to ADLs: +2 total A ADL Comments: lmited by NWB RLE. impaired balanc , coordination and loss of sensation    OT Diagnosis: Generalized weakness;Paresis  OT Problem List: Decreased strength;Impaired balance (sitting and/or standing);Decreased safety awareness;Decreased knowledge of use of DME or AE;Decreased knowledge of precautions;Impaired sensation;Impaired tone;Obesity OT Treatment Interventions: Self-care/ADL training;Therapeutic exercise;Neuromuscular education;DME and/or AE instruction;Therapeutic activities;Patient/family education;Balance training   OT Goals Acute Rehab OT Goals OT Goal Formulation: With patient Time For Goal Achievement: 05/31/12 Potential to Achieve Goals: Good ADL Goals Pt Will Perform Upper Body Dressing: with supervision;Sitting, bed;Unsupported ADL Goal: Upper Body Dressing - Progress: Goal set today Pt Will Transfer to Toilet: with mod assist;with DME;Drop arm 3-in-1;Maintaining weight bearing status;Squat pivot transfer ADL Goal: Toilet Transfer - Progress: Goal set today Pt Will Perform Toileting - Clothing Manipulation: with mod assist;Sitting on 3-in-1  or toilet;Other (comment) (lat leans) ADL Goal: Toileting - Clothing Manipulation - Progress: Goal set today Pt Will Perform Toileting - Hygiene: with min assist;Leaning right and/or left on 3-in-1/toilet;with  cueing (comment type and amount) ADL Goal: Toileting - Hygiene - Progress: Goal set today Additional ADL Goal #1: Pt will maintain sitting balance EOB during functional task x 5 min with SBA in prep for ADL retraining ADL Goal: Additional Goal #1 - Progress: Goal set today Arm Goals Pt Will Complete Theraband Exer: with supervision, verbal cues required/provided;to increase strength;Bilateral upper extremities;2 sets;Level 3 Theraband;15 reps Arm Goal: Theraband Exercises - Progress: Goal set today  Visit Information  Last OT Received On: 04/19/12 Assistance Needed: +2    Subjective Data  Subjective: I want ot be able to take care of myself Patient Stated Goal: independence   Prior Functioning     Home Living Lives With: Spouse Available Help at Discharge: Other (Comment);Available 24 hours/day (family works. 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 Communication: No difficulties Dominant Hand: Right         Vision/Perception Perception Perception: Within Functional Limits Praxis Praxis: Intact   Cognition  Overall Cognitive Status: Appears within functional limits for tasks  assessed/performed Arousal/Alertness: Awake/alert Orientation Level: Oriented X4 / Intact Behavior During Session: [facility] for tasks performed    Extremity/Trunk Assessment Right Upper Extremity Assessment RUE ROM/Strength/Tone: Within functional levels RUE Sensation: WFL - Light Touch;WFL - Proprioception RUE Coordination: WFL - gross/fine motor Left Upper Extremity Assessment LUE ROM/Strength/Tone: Within functional levels LUE Sensation: WFL - Light Touch;WFL - Proprioception LUE Coordination: WFL - gross/fine motor Right Lower Extremity Assessment RLE ROM/Strength/Tone: Deficits RLE ROM/Strength/Tone Deficits: @ 2-/5 hip. 2=/5 hamstrings. 2-/5 quads. ankle cast (dermaotme - reports decresaed sensation @T10  and below can i) RLE Sensation: Deficits RLE Sensation Deficits: dermatome affected @ T6 and below. However, pt able to detect sesation throughS1 RLE Coordination: Deficits RLE Coordination Deficits: able to iimprove coordiantion with visual input Left Lower Extremity Assessment LLE ROM/Strength/Tone: Deficits LLE ROM/Strength/Tone Deficits: quads/hams/df/pf >=4/5, hip flexors 4-/5 LLE Sensation: Deficits LLE Sensation Deficits: Reports decreased sensation beginning @ T 6. Reports absent sensation below this level. LLE Coordination: Deficits LLE Coordination Deficits: decrased gross/fine motor Trunk Assessment Trunk Assessment: Normal     Mobility Bed Mobility Bed Mobility: Not assessed (pt in chair) Transfers Transfers:  (see PT note)     Shoulder Instructions     Exercise General Exercises - Upper Extremity Chair Push Up: AROM;Strengthening;Both;5 reps Other Exercises Other Exercises: core strengthening   Balance Balance Balance Assessed: Yes Static Sitting Balance Static Sitting - Balance Support: No upper extremity supported;Feet supported Static Sitting - Level of Assistance: 5: Stand by assistance Static Sitting - Comment/# of Minutes: 3 Dynamic Sitting  Balance Dynamic Sitting - Balance Support: No upper extremity supported;Feet supported Dynamic Sitting - Level of Assistance: 3: Mod assist;Other (comment) (greater support needed with reaching further out of BOS) Dynamic Sitting - Comments: righting rxns present . Good midline awareness   End of Session OT - End of Session Equipment Utilized During Treatment: Gait belt Activity Tolerance: Patient tolerated treatment well Patient left: in chair;with call bell/phone within reach;with family/visitor present Nurse Communication: Mobility status;Need for lift equipment (use maximove)  GO     Noreta Kue,HILLARY 04/19/2012, 6:35 PM [facility], OTR/L  [phone] 04/19/2012

## 2012-04-20 ENCOUNTER — Encounter (HOSPITAL_COMMUNITY): Payer: Self-pay

## 2012-04-20 ENCOUNTER — Inpatient Hospital Stay (HOSPITAL_COMMUNITY)
Admission: RE | Admit: 2012-04-20 | Discharge: 2012-05-11 | DRG: 462 | Disposition: A | Discharge status: Home / Self Care | Payer: BC Managed Care – PPO | Source: Intra-hospital | Attending: Physical Medicine & Rehabilitation | Admitting: Physical Medicine & Rehabilitation

## 2012-04-20 DIAGNOSIS — M546 Pain in thoracic spine: Secondary | ICD-10-CM

## 2012-04-20 DIAGNOSIS — G373 Acute transverse myelitis in demyelinating disease of central nervous system: Secondary | ICD-10-CM

## 2012-04-20 DIAGNOSIS — M109 Gout, unspecified: Secondary | ICD-10-CM | POA: Diagnosis present

## 2012-04-20 DIAGNOSIS — M549 Dorsalgia, unspecified: Secondary | ICD-10-CM | POA: Diagnosis present

## 2012-04-20 DIAGNOSIS — K592 Neurogenic bowel, not elsewhere classified: Secondary | ICD-10-CM | POA: Diagnosis present

## 2012-04-20 DIAGNOSIS — W19XXXA Unspecified fall, initial encounter: Secondary | ICD-10-CM | POA: Diagnosis present

## 2012-04-20 DIAGNOSIS — K5900 Constipation, unspecified: Secondary | ICD-10-CM | POA: Diagnosis present

## 2012-04-20 DIAGNOSIS — G8389 Other specified paralytic syndromes: Secondary | ICD-10-CM | POA: Diagnosis present

## 2012-04-20 DIAGNOSIS — I10 Essential (primary) hypertension: Secondary | ICD-10-CM | POA: Diagnosis present

## 2012-04-20 DIAGNOSIS — S79929A Unspecified injury of unspecified thigh, initial encounter: Secondary | ICD-10-CM | POA: Diagnosis present

## 2012-04-20 DIAGNOSIS — G0489 Other myelitis: Secondary | ICD-10-CM | POA: Diagnosis present

## 2012-04-20 DIAGNOSIS — Z5189 Encounter for other specified aftercare: Principal | ICD-10-CM

## 2012-04-20 DIAGNOSIS — S8263XA Displaced fracture of lateral malleolus of unspecified fibula, initial encounter for closed fracture: Secondary | ICD-10-CM

## 2012-04-20 DIAGNOSIS — X58XXXA Exposure to other specified factors, initial encounter: Secondary | ICD-10-CM | POA: Diagnosis present

## 2012-04-20 DIAGNOSIS — S82891A Other fracture of right lower leg, initial encounter for closed fracture: Secondary | ICD-10-CM

## 2012-04-20 DIAGNOSIS — S79919A Unspecified injury of unspecified hip, initial encounter: Secondary | ICD-10-CM | POA: Diagnosis present

## 2012-04-20 DIAGNOSIS — G360 Neuromyelitis optica [Devic]: Secondary | ICD-10-CM

## 2012-04-20 DIAGNOSIS — S82899A Other fracture of unspecified lower leg, initial encounter for closed fracture: Secondary | ICD-10-CM | POA: Diagnosis present

## 2012-04-20 HISTORY — DX: Unspecified osteoarthritis, unspecified site: M19.90

## 2012-04-20 HISTORY — DX: Headache: R51

## 2012-04-20 LAB — CSF CULTURE W GRAM STAIN
Culture: NO GROWTH
Special Requests: NORMAL

## 2012-04-20 MED ORDER — AMLODIPINE BESYLATE 5 MG PO TABS
5.0000 mg | ORAL_TABLET | Freq: Every day | ORAL | Status: DC
  Administered 2012-04-21 – 2012-05-11 (×19): 5 mg via ORAL
  Filled 2012-04-20 (×23): qty 1

## 2012-04-20 MED ORDER — ENOXAPARIN SODIUM 30 MG/0.3ML ~~LOC~~ SOLN
30.0000 mg | Freq: Two times a day (BID) | SUBCUTANEOUS | Status: DC
  Administered 2012-04-20 – 2012-04-21 (×3): 30 mg via SUBCUTANEOUS
  Filled 2012-04-20 (×6): qty 0.3

## 2012-04-20 MED ORDER — ONDANSETRON HCL 4 MG/2ML IJ SOLN: 4.0000 mg | Freq: Four times a day (QID) | INTRAMUSCULAR | Status: DC | PRN

## 2012-04-20 MED ORDER — IRBESARTAN 150 MG PO TABS
150.0000 mg | ORAL_TABLET | Freq: Every day | ORAL | Status: DC
  Administered 2012-04-21 – 2012-05-11 (×19): 150 mg via ORAL
  Filled 2012-04-20 (×23): qty 1

## 2012-04-20 MED ORDER — ONDANSETRON HCL 4 MG PO TABS: 4.0000 mg | ORAL_TABLET | Freq: Four times a day (QID) | ORAL | Status: DC | PRN

## 2012-04-20 MED ORDER — HYDROCODONE-ACETAMINOPHEN 5-325 MG PO TABS
1.0000 | ORAL_TABLET | ORAL | Status: DC | PRN
  Administered 2012-04-25 – 2012-05-11 (×9): 2 via ORAL
  Filled 2012-04-20 (×8): qty 2

## 2012-04-20 MED ORDER — SENNOSIDES-DOCUSATE SODIUM 8.6-50 MG PO TABS
1.0000 | ORAL_TABLET | Freq: Every evening | ORAL | Status: DC | PRN
  Filled 2012-04-20 (×3): qty 1

## 2012-04-20 MED ORDER — SORBITOL 70 % SOLN
30.0000 mL | Freq: Every day | Status: DC | PRN
  Administered 2012-04-28: 30 mL via ORAL
  Filled 2012-04-20: qty 30

## 2012-04-20 MED ORDER — ACETAMINOPHEN 325 MG PO TABS
325.0000 mg | ORAL_TABLET | ORAL | Status: DC | PRN
  Administered 2012-04-27: 650 mg via ORAL
  Filled 2012-04-20: qty 2

## 2012-04-20 MED ORDER — PANTOPRAZOLE SODIUM 40 MG PO TBEC
40.0000 mg | DELAYED_RELEASE_TABLET | Freq: Every day | ORAL | Status: DC
  Administered 2012-04-21 – 2012-05-10 (×20): 40 mg via ORAL
  Filled 2012-04-20 (×20): qty 1

## 2012-04-20 MED ORDER — ALLOPURINOL 300 MG PO TABS
300.0000 mg | ORAL_TABLET | Freq: Every day | ORAL | Status: DC
  Administered 2012-04-21 – 2012-05-11 (×21): 300 mg via ORAL
  Filled 2012-04-20 (×23): qty 1

## 2012-04-20 NOTE — Discharge Summary (Signed)
Physician Discharge Summary  Patient ID: Matthew Juarez MRN: [id number] DOB/AGE: 09-21-1960 52 y.o.  Admit date: 04/16/2012 Discharge date: 04/20/2012  Primary Care Physician:  Alva Garnet., MD   Discharge Diagnoses:    Principal Problem:  *Acute transverse myelitis Active Problems:  Lower paraplegia  NSVT (nonsustained ventricular tachycardia)  Closed right ankle fracture      Medication List     As of 04/20/2012  4:49 PM    TAKE these medications         allopurinol 300 MG tablet   Commonly known as: ZYLOPRIM   Take 300 mg by mouth daily.      AZOR 5-20 MG per tablet   Generic drug: amLODipine-olmesartan   Take 1 tablet by mouth daily.      Co Q-10 200 MG Caps   Take 1 capsule by mouth daily.      diphenhydramine-acetaminophen 25-500 MG Tabs   Commonly known as: TYLENOL PM   Take 1 tablet by mouth at bedtime.      VITAMIN D PO   Take 1 tablet by mouth daily.         Disposition and Follow-up:  Will be discharged to [facility] today for rehab purposes.  Consults:  Neurology, Dr. Leroy Kennedy   Significant Diagnostic Studies:  No results found.  Brief H and P: For complete details please refer to admission H and P, but in brief patient is a 52 y/o man who presented with leg numbness and weakness for the past 5 days it was progressive in nature. He denies any back pain, no fever or chills. At this point is un able to put any weight on his legs. He denies any weight loss. HE have not had a BM since Tuesday. NO urinary incontinence. He have had a normal colonoscopy 2 years ago.  He was so weak that he fell yesterday and fractured his right ankle, orthopedics has been consulted and will see him in AM.  MRI showed lesion at the affecting spinal cord with abnormal signal from C5-T6 showing tumor vs demyelination.  Neurology have seen him and have done an LP to evaluate for infection if negative they will start patient on steroids. Hospitalist was called to admit.       Hospital Course:  Principal Problem:  *Acute transverse myelitis Active Problems:  Lower paraplegia  NSVT (nonsustained ventricular tachycardia)  Closed right ankle fracture   Acute Transverse Myelitis  -Continue high dose steroids.  -Neurology following.  -For CIR today.  NSVT  -EF 55-60%.  -No further work up. (could have been steroid-induced).   Right Ankle Fracture  -As per Dr. August Saucer, plan is for a cast on Wednesday.   Time spent on Discharge: Greater than 30 minutes.  SignedChaya Jan [facility] Pager: [phone] 04/20/2012, 4:49 PM

## 2012-04-20 NOTE — PMR Pre-admission (Signed)
PMR Admission Coordinator Pre-Admission Assessment  Patient: Matthew Juarez is an 52 y.o., male MRN: [id number] DOB: 14-Nov-1960 Height: 6\' 5"  (195.6 cm) Weight: 133.6 kg (294 lb 8.6 oz)              Insurance Information: (To be added when insurance BCBS benefits is open) HMO:     PPO:      PCP:      IPA:      80/20:      OTHER:  PRIMARY: BCBS State      Policy#: [id number]      Subscriber: self CM Name:       Phone#:      Fax#:  Pre-Cert#:      Employer: [employer] Benefits:  Phone #:     Name:  Eff. Date: 03/31/12    Deduct: [phone] ($709 met)    Out of Pocket Max:$3000 (none met)       Life Max: none  CIR: 70/30%   SNF: 70/30% Outpatient: 70/30%     Co-Pay: $35 Home Health: 70/30%      Co-Pay:  DME: 70/30%     Co-Pay:  Providers: in network  Emergency Contact Information Contact Information    Name Relation Home Work Mobile   Detwiler,Crystal Spouse [phone]     Daoud,Olive Mother [phone]       Current Medical History  Patient Admitting Diagnosis: Cervico-thoracic transverse myelitis, clinically T8 with Tally Joe presentation/ right distal fibular fx  History of Present Illness: 52 y.o. right-handed male with history of hypertension as well as chronic back pain. Admitted 04/17/2012 with progressive lower extremity weakness x5 days. He denied any chills or fever. He denied any urinary incontinence but had bouts of constipation. MRI spine showed intrinsic intramedullary cord lesion with prolonged T2 signal and variable abnormal enhancement. Findings most consistent with idiopathic acute transverse myelitis. Neurology services consulted and patient placed on intravenous Solu-Medrol. Awaiting CSF results. Plan 3 days of Solu-Medrol and then decide about a trial of IVIG or plasmapheresis if no improvement. Echocardiogram is pending.  Pt fell at home d/t to weakness and and fractured his right ankle. Presently with a short splint. As per Dr. August Saucer, plan is for a cast on  Wednesday.  Past Medical History  Past Medical History  Diagnosis Date  . Chronic back pain   . Hypertension   . Gout    Family History  Family history includes Colon cancer in his maternal aunt and Lung cancer in his maternal uncle.  Prior Rehab/Hospitalizations: none   Current Medications  Current facility-administered medications:acetaminophen (TYLENOL) suppository 650 mg, 650 mg, Rectal, Q6H PRN, Therisa Doyne, MD;  acetaminophen (TYLENOL) tablet 650 mg, 650 mg, Oral, Q6H PRN, Therisa Doyne, MD, 650 mg at 04/20/12 0819;  allopurinol (ZYLOPRIM) tablet 300 mg, 300 mg, Oral, Daily, Therisa Doyne, MD, 300 mg at 04/20/12 1210 amLODipine (NORVASC) tablet 5 mg, 5 mg, Oral, Daily, Therisa Doyne, MD, 5 mg at 04/20/12 1210;  docusate sodium (COLACE) capsule 100 mg, 100 mg, Oral, BID, Therisa Doyne, MD, 100 mg at 04/20/12 1211;  HYDROcodone-acetaminophen (NORCO/VICODIN) 5-325 MG per tablet 1 tablet, 1 tablet, Oral, Q4H PRN, Carleene Cooper III, MD HYDROcodone-acetaminophen (NORCO/VICODIN) 5-325 MG per tablet 1-2 tablet, 1-2 tablet, Oral, Q4H PRN, Therisa Doyne, MD, 2 tablet at 04/19/12 0427;  irbesartan (AVAPRO) tablet 150 mg, 150 mg, Oral, Daily, Therisa Doyne, MD, 150 mg at 04/20/12 1210;  magnesium hydroxide (MILK OF MAGNESIA) suspension 30 mL, 30 mL, Oral, Daily PRN,  Gerome Apley [place], PA, 30 mL at 04/17/12 2209 methylPREDNISolone sodium succinate (SOLU-MEDROL) 500 mg in sodium chloride 0.9 % 50 mL IVPB, 500 mg, Intravenous, Q12H, Thana Farr, MD, 500 mg at 04/20/12 1610;  morphine 2 MG/ML injection 2 mg, 2 mg, Intravenous, Q4H PRN, Therisa Doyne, MD;  ondansetron (ZOFRAN) injection 4 mg, 4 mg, Intravenous, Q6H PRN, Therisa Doyne, MD;  ondansetron (ZOFRAN) tablet 4 mg, 4 mg, Oral, Q6H PRN, Therisa Doyne, MD pantoprazole (PROTONIX) EC tablet 40 mg, 40 mg, Oral, Daily, Therisa Doyne, MD, 40 mg at 04/20/12 1208;  sodium chloride 0.9 % injection 3 mL,  3 mL, Intravenous, Q12H, Therisa Doyne, MD, 3 mL at 04/19/12 2358  Patients Current Diet: Cardiac  Precautions / Restrictions Precautions Precautions: Fall Precaution Comments: loss of sensation or proprioception Restrictions Weight Bearing Restrictions: Yes RLE Weight Bearing: Non weight bearing LLE Weight Bearing: Non weight bearing   Prior Activity Level Community (5-7x/wk): Active daily Home Assistive Devices / Equipment Home Assistive Devices/Equipment: None Home Adaptive Equipment: None (w/c too small for him)  Prior Functional Level Prior Function Level of Independence: Independent Able to Take Stairs?: Yes Driving: Yes Vocation: Full time employment (Middle Engineer, site) Comments: Armed forces operational officer  Current Functional Level Cognition  Arousal/Alertness: Awake/alert Overall Cognitive Status: Appears within functional limits for tasks assessed/performed Orientation Level: Oriented X4    Extremity Assessment (includes Sensation/Coordination)  RUE ROM/Strength/Tone: Within functional levels RUE Sensation: WFL - Light Touch;WFL - Proprioception RUE Coordination: WFL - gross/fine motor  RLE ROM/Strength/Tone: Deficits RLE ROM/Strength/Tone Deficits: @ 2-/5 hip. 2=/5 hamstrings. 2-/5 quads. ankle cast (dermaotme - reports decresaed sensation @T10  and below can i) RLE Sensation: Deficits RLE Sensation Deficits: dermatome affected @ T6 and below. However, pt able to detect sesation throughS1 RLE Coordination: Deficits RLE Coordination Deficits: able to iimprove coordiantion with visual input    ADLs  Eating/Feeding: Independent Where Assessed - Eating/Feeding: Chair Grooming: Set up Where Assessed - Grooming: Supported sitting Upper Body Bathing: Set up Where Assessed - Upper Body Bathing: Supported sitting Lower Body Bathing: +1 Total assistance Where Assessed - Lower Body Bathing: Supported sitting;Lean right and/or left Upper Body Dressing: Minimal  assistance Where Assessed - Upper Body Dressing: Supported sitting Lower Body Dressing: +1 Total assistance Where Assessed - Lower Body Dressing: Lean right and/or left;Supported sitting Toilet Transfer: +2 Total assistance Toilet Transfer Method: Other (comment) (simulated) Toilet Transfer Equipment: Other (comment) (chair) Toileting - Clothing Manipulation and Hygiene: +1 Total assistance Where Assessed - Glass blower/designer Manipulation and Hygiene: Lean right and/or left Tub/Shower Transfer: +2 Total assistance Equipment Used: Gait belt Transfers/Ambulation Related to ADLs: +2 total A ADL Comments: lmited by NWB RLE. impaired balanc , coordination and loss of sensation    Mobility  Bed Mobility: Not assessed (pt in chair) Supine to Sit: 4: Min assist Sitting - Scoot to Edge of Bed: 4: Min assist Sit to Supine: 4: Min assist;HOB flat;Other (comment) (at LE's) Scooting to [facility]: 4: Min assist;Other (comment) (to position his L LE)    Transfers  Transfers: Sit to Stand;Stand to Sit;Lateral/Scoot Transfers Sit to Stand: 1: +2 Total assist;With upper extremity assist;From bed;From elevated surface Sit to Stand: Patient Percentage: 40% Stand to Sit: 1: +2 Total assist;With upper extremity assist;To bed;To elevated surface Stand to Sit: Patient Percentage: 30% Squat Pivot Transfers: 3: Mod assist;With upper extremity assistance Lateral/Scoot Transfers: 1: +2 Total assist;From elevated surface Lateral Transfers: Patient Percentage: 50%    Ambulation / Gait / Stairs / Wheelchair Mobility  Ambulation/Gait  Ambulation/Gait Assistance: Not tested (comment) Stairs: No Wheelchair Mobility Wheelchair Mobility: No    Posture / Balance Static Sitting Balance Static Sitting - Balance Support: No upper extremity supported;Feet supported Static Sitting - Level of Assistance: 5: Stand by assistance Static Sitting - Comment/# of Minutes: 3 Dynamic Sitting Balance Dynamic Sitting - Balance  Support: No upper extremity supported;Feet supported Dynamic Sitting - Level of Assistance: 3: Mod assist;Other (comment) (greater support needed with reaching further out of BOS) Dynamic Sitting - Comments: righting rxns present . Good midline awareness    Special needs/care consideration Receiving IV Solumedrol  Short splint with ACE wrap to (R) lower leg / to get cast Wed. Skin - WNL Bowel mgmt: Constipation. Required enema for LBM:1/21. Bladder mgmt:WNL   Previous Home Environment Living Arrangements: Spouse/significant other Lives With: Spouse Available Help at Discharge: Other (Comment);Available 24 hours/day (family works. wife will be home - bus driver) Type of Home: House Home Layout: One level Home Access: Stairs to enter Entrance Stairs-Rails: Doctor, general practice of Steps: 5 Bathroom Shower/Tub: Tub/shower unit;Tub only;Door [employer] Toilet: Standard Bathroom Accessibility: Yes How Accessible: Accessible via walker;Accessible via wheelchair (only to the front area of the bathroom) Home Care Services: No  Discharge Living Setting Plans for Discharge Living Setting: Patient's home;Lives with wife Type of Home at Discharge: House Discharge Home Layout: One level Discharge Home Access: Stairs to enter Entrance Stairs-Rails: Can reach both Entrance Stairs-Number of Steps: 5 Discharge Bathroom Shower/Tub: Tub/shower unit Discharge Bathroom Toilet: Standard Discharge Bathroom Accessibility: Yes How Accessible: Accessible via walker Do you have any problems obtaining your medications?: No  Social/Family/Support Systems Patient Roles: Spouse;Parent (Pt has a son in town.) Contact Information: [phone] Anticipated Caregiver: Wife: Catering manager / Pt's mother may be able to provide supervision as needed. Anticipated Caregiver's Contact Information: Cell: [phone] Ability/Limitations of Caregiver: Min assist Caregiver Availability: Intermittent  (Wife works full-time) Discharge Plan Discussed with Primary Caregiver: Yes Is Caregiver In Agreement with Plan?: Yes Does Caregiver/Family have Issues with Lodging/Transportation while Pt is in Rehab?: No  Goals/Additional Needs Patient/Family Goal for Rehab: Mod independent at w/c/level Expected length of stay: 2-3 weeks Cultural Considerations: none Equipment Needs: TBD Pt/Family Agrees to Admission and willing to participate: Yes Program Orientation Provided & Reviewed with Pt/Caregiver Including Roles  & Responsibilities: Yes  Decrease burden of Care through IP rehab admission: n/a  Possible need for SNF placement upon discharge: No  Patient Condition: This patient's condition remains as documented in the Consult dated 04/19/12, in which the Rehabilitation Physician determined and documented that the patient's condition is appropriate for intensive rehabilitative care in an inpatient rehabilitation facility.  Preadmission Screen Completed By:  Meryl Dare, 04/20/2012 1:30 PM ______________________________________________________________________   Discussed status with Dr. Riley Kill on 04/20/12 at 2:30 PM and received telephone approval for admission today.  Admission Coordinator:  Meryl Dare, time 2:37 PM/Date 04/20/12

## 2012-04-20 NOTE — Progress Notes (Signed)
Admitting patient to CIR today. Pt's insurance has authorized CIR and pt is in agreement with plan to come today. For questions: [phone]

## 2012-04-20 NOTE — Progress Notes (Signed)
NEURO HOSPITALIST PROGRESS NOTE   SUBJECTIVE:                                                                                                                        No new neurological complains. Feels that he is slightly improved, " I am moving better the left leg but the right leg so so". Numbness hasn't change pattern or distribution. Got a fleet enema last night and was able to have a bowel movement. Serological testing including low titer positive ANA no particularly impressive so far. Had had 3 days IV solumedrol.  OBJECTIVE:                                                                                                                           Vital signs in last 24 hours: Temp:  [97.5 F (36.4 C)-98.4 F (36.9 C)] 97.6 F (36.4 C) (01/21 0434) Pulse Rate:  [71-83] 73  (01/21 0434) Resp:  [16-18] 18  (01/21 0434) BP: (112-138)/(70-80) 112/70 mmHg (01/21 0434) SpO2:  [95 %-97 %] 97 % (01/21 0434)  Intake/Output from previous day: 01/20 0701 - 01/21 0700 In: 480 [P.O.:480] Out: 902 [Urine:900; Stool:2] Intake/Output this shift: Total I/O In: 120 [P.O.:120] Out: 200 [Urine:200] Nutritional status: Cardiac  Past Medical History  Diagnosis Date  . Chronic back pain   . Hypertension   . Gout     Neurologic ROS negative with exception of above. Musculoskeletal ROS: no pain fractured right foot.  Neurologic Exam:   Mental Status: Alert, oriented, thought content appropriate.  Speech fluent without evidence of aphasia.  Able to follow 3 step commands without difficulty. Cranial Nerves: II: Discs flat bilaterally; Visual fields grossly normal, pupils equal, round, reactive to light and accommodation III,IV, VI: ptosis not present, extra-ocular motions intact bilaterally V,VII: smile symmetric, facial light touch sensation normal bilaterally VIII: hearing normal bilaterally IX,X: gag reflex present XI: bilateral shoulder  shrug XII: midline tongue extension Motor: right LE is 2/5 proximally and can not properly evaluate distally. LLE 4/5 proximally. Sensory:     Tone: decreased bilateral LE. Sensory: markedly diminished light touch and pinprick from the left nipple all the way downward. Agree that there is also some sensory involvement in the right  from the umbilicus downward. Deep Tendon Reflexes: 2+ symmetric. Plantars: Right: downgoing   Left: downgoing Cerebellar: normal finger-to-nose, heel-to-shin test no tested. Gait: no tested. CV: pulses palpable throughout    Lab Results: No results found for this basename: cbc, bmp, coags, chol, tri, ldl, hga1c   Lipid Panel No results found for this basename: CHOL,TRIG,HDL,CHOLHDL,VLDL,LDLCALC in the last 72 hours  Studies/Results: No results found.  MEDICATIONS                                                                                                                       I have reviewed the patient's current medications.  ASSESSMENT/PLAN:                                                                                                           Transverse myelitis with long segmental involvement cervico-thoracic region, which makes me think this is not quite consistent with a demyelinating disorder, and perhaps more likely inflammatory. No visual symptomatology to suggest neuromyelitis optica, although isolated cord involvement has been described in cases of NMO. Thus, will suggest getting serum NMO-IGG antibodies. In addition, SS-A and SS-B antibodies, anti ds-DNA. He reports marginal improvement after 3 days of IV solumedrol, and I think it is prudent to complete 5 days high dose IV steroids and see if he can get better. Otherwise, an empirical trial of PF or IVIG seems to be prudent.  Continue PT. Will follow up.   Wyatt Portela, MD [facility] [phone]  04/20/2012, 1:27 PM

## 2012-04-20 NOTE — Progress Notes (Signed)
Physical Therapy Treatment Patient Details Name: Matthew Juarez MRN: [id number] DOB: May 24, 1960 Today's Date: 04/20/2012 Time: 1012-1049 PT Time Calculation (min): 37 min  PT Assessment / Plan / Recommendation Comments on Treatment Session  52 yo adm with bil LE weakness (? transverse myelitis) and fall with Rt ankle fx. Pt with overall good return of strength in Lt knee and ankle, however Lt hip remains weak with inability to stand. LLE also completely numb per pt. RLE strength appears improved. Very motivated and eager to begin inpatient rehab.    Follow Up Recommendations  CIR                 Equipment Recommendations  Other (comment) (TBA )        Frequency Min 4X/week   Plan Discharge plan remains appropriate;Frequency remains appropriate    Precautions / Restrictions Precautions: Fall Precaution Comments: loss of sensation or proprioception (LLE worse than RLE) Required Braces or Orthoses: Other Brace/Splint Other Brace/Splint: posterior splint RLE  Restrictions RLE Weight Bearing: Non weight bearing LLE Weight Bearing: Weight bearing as tolerated Other Position/Activity Restrictions: Pt educated to perform pressure relief q 20-30 minutes each hour while sitting. Pt able to laterally lean on armrest to relieve one buttock when reclined; able to perform chair pushup on armrests when feet on floor   Pertinent Vitals/Pain Denied pain    Mobility  Bed Mobility Bed Mobility: Supine to Sit;Sitting - Scoot to Edge of Bed Supine to Sit: 4: Min assist;HOB elevated;With rails Sitting - Scoot to Edge of Bed: 4: Min guard Details for Bed Mobility Assistance: HOB 30; assist to move RLE off Rt EOB; pt with incr effort to raise torso via propping on his elbows and then pushing to sitting Transfers Transfers: Sit to Stand;Stand to Sit;Lateral/Scoot Transfers Sit to Stand: 1: +2 Total assist;From elevated surface;With upper extremity assist;From bed Sit to Stand: Patient Percentage:  20% Lateral/Scoot Transfers: 1: +2 Total assist;From elevated surface;With slide board Lateral Transfers: Patient Percentage: 50% Details for Transfer Assistance: attempted sit to stand with RW from elevated bed as pt with excellent knee extension strength on Lt and bil UE strength; pt unable to fully clear his hips/buttocks off EOB x 2 trials with +2 assist. Recliner with drop arm not available, therefore elevated bed to ~2 inches higher than armrest and placed enough blankets in seat of recliner to allow use of sliding board over armrest into chair. Explained head/hips counter-movements and pt able to lean forward and slightly to his Lt while scooting across board to the chair on his Rt. Also gravity assisted as moving "down hill"    Exercises General Exercises - Upper Extremity Chair Push Up: AROM;Both;Other reps (comment);Seated (3 reps pressure relief) General Exercises - Lower Extremity Ankle Circles/Pumps: AROM;10 reps;Supine;Left Quad Sets: AROM;Left;10 reps;Supine Short Arc Quad: AROM;Left;5 reps;Supine Long Arc Quad: AROM;AAROM;Left;Right;5 reps;Seated;Other (comment) (on Lt, pt with incr eccentric knee ext and concentric flexio) Heel Slides: AAROM;Right;5 reps;Supine Straight Leg Raises: AAROM;Left;Other reps (comment);Supine (3 reps) Hip Flexion/Marching: AAROM;Right;5 reps;Seated (trace eccentric control of hip flexors to lower leg)       PT Goals Acute Rehab PT Goals Pt will go Supine/Side to Sit: with supervision;with HOB 0 degrees PT Goal: Supine/Side to Sit - Progress: Progressing toward goal Pt will go Sit to Stand: with mod assist PT Goal: Sit to Stand - Progress: Not progressing Pt will Transfer Bed to Chair/Chair to Bed: with min assist PT Transfer Goal: Bed to Chair/Chair to Bed - Progress: Progressing toward  goal  Visit Information  Last PT Received On: 04/20/12 Assistance Needed: +2    Subjective Data  Subjective: Reports he feels numbness and weakness are  unchanged   Cognition  Overall Cognitive Status: Appears within functional limits for tasks assessed/performed Arousal/Alertness: Awake/alert Orientation Level: Oriented X4 / Intact Behavior During Session: [facility] for tasks performed    Balance  Static Sitting Balance Static Sitting - Balance Support: No upper extremity supported;Feet supported Static Sitting - Level of Assistance: 5: Stand by assistance Dynamic Sitting Balance Dynamic Sitting - Balance Support: Feet supported;Right upper extremity supported;Left upper extremity supported Dynamic Sitting - Level of Assistance: 4: Min assist Dynamic Sitting - Comments: alternated support of one UE while reaching with opposite UE; pt able to reach up to 6 inches beyond neutral with each arm laterally; pt with loss of balance posteriorly x1 requiring min assist to recover (over compensated posteriorly as he returned to upright from reaching forward)  End of Session PT - End of Session Equipment Utilized During Treatment: Gait belt Activity Tolerance: Patient tolerated treatment well Patient left: in chair;with call bell/phone within reach   GP     Matthew Juarez 04/20/2012, 2:12 PM Pager [phone]

## 2012-04-20 NOTE — Progress Notes (Signed)
[facility]             Progress Note   Subjective: No new complaints.  All questions answered. Cheerful as usual.  Objective: Vital signs in last 24 hours: Temp:  [97.5 F (36.4 C)-98.4 F (36.9 C)] 97.6 F (36.4 C) (01/21 0434) Pulse Rate:  [71-83] 73  (01/21 0434) Resp:  [16-18] 18  (01/21 0434) BP: (112-138)/(70-80) 112/70 mmHg (01/21 0434) SpO2:  [95 %-97 %] 97 % (01/21 0434) Weight change:  Last BM Date: 04/20/12  Intake/Output from previous day: 01/20 0701 - 01/21 0700 In: 480 [P.O.:480] Out: 902 [Urine:900; Stool:2] Total I/O In: 240 [P.O.:240] Out: 500 [Urine:500]   Physical Exam: General: Alert, awake, oriented x3, in no acute distress. HEENT: No bruits, no goiter. Heart: Regular rate and rhythm, without murmurs, rubs, gallops. Lungs: Clear to auscultation bilaterally. Abdomen: Soft, nontender, nondistended, positive bowel sounds. Extremities: No clubbing cyanosis or edema with positive pedal pulses. Right leg in splint.    Lab Results: Basic Metabolic Panel: No results found for this basename: NA:2,K:2,CL:2,CO2:2,GLUCOSE:2,BUN:2,CREATININE:2,CALCIUM:2,MG:2,PHOS:2 in the last 72 hours Liver Function Tests: No results found for this basename: AST:2,ALT:2,ALKPHOS:2,BILITOT:2,PROT:2,ALBUMIN:2 in the last 72 hours CBC: No results found for this basename: WBC:2,NEUTROABS:2,HGB:2,HCT:2,MCV:2,PLT:2 in the last 72 hours Thyroid Function Tests: No results found for this basename: TSH,T4TOTAL,FREET4,T3FREE,THYROIDAB in the last 72 hours Anemia Panel: No results found for this basename: VITAMINB12,FOLATE,FERRITIN,TIBC,IRON,RETICCTPCT in the last 72 hours No results found for this basename: COLORURINE:2,APPERANCEUR:2,LABSPEC:2,PHURINE:2,GLUCOSEU:2,HGBUR:2,BILIRUBINUR:2,KETONESUR:2,PROTEINUR:2,UROBILINOGEN:2,NITRITE:2,LEUKOCYTESUR:2 in the last 72 hours  Recent Results (from the past 240 hour(s))  GRAM STAIN     Status: Normal   Collection Time   04/17/12 12:59 AM      Component Value Range Status Comment   Specimen Description CSF   Final    Special Requests Normal   Final    Gram Stain     Final    Value: CYTOSPUN     NO ORGANISMS SEEN     WBC PRESENT,BOTH PMN AND MONONUCLEAR     Results Called to: Earlene Plater [id number] @0158  [facility]ilder[facility]   Report Status 04/17/2012 FINAL   Final   CSF CULTURE     Status: Normal   Collection Time   04/17/12 12:59 AM      Component Value Range Status Comment   Specimen Description CSF   Final    Special Requests Normal   Final    Gram Stain     Final    Value: CYTOSPIN WBC PRESENT, PREDOMINANTLY MONONUCLEAR     NO ORGANISMS SEEN   Culture NO GROWTH 3 DAYS   Final    Report Status 04/20/2012 FINAL   Final   FUNGUS CULTURE W SMEAR     Status: Normal (Preliminary result)   Collection Time   04/17/12 12:59 AM      Component Value Range Status Comment   Specimen Description CSF   Final    Special Requests NONE   Final    Fungal Smear NO YEAST OR FUNGAL ELEMENTS SEEN   Final    Culture CULTURE IN PROGRESS FOR FOUR WEEKS   Final    Report Status PENDING   Incomplete     Studies/Results: No results found.  Medications: Scheduled Meds:    . allopurinol  300 mg Oral Daily  . amLODipine  5 mg Oral Daily   And  . irbesartan  150 mg Oral Daily  . docusate sodium  100 mg Oral BID  . methylPREDNISolone (SOLU-MEDROL) injection  500 mg Intravenous Q12H  .  pantoprazole  40 mg Oral Daily  . sodium chloride  3 mL Intravenous Q12H   Continuous Infusions:  PRN Meds:.acetaminophen, acetaminophen, HYDROcodone-acetaminophen, HYDROcodone-acetaminophen, magnesium hydroxide, morphine injection, ondansetron (ZOFRAN) IV, ondansetron  Assessment/Plan:  Principal Problem:  *Acute transverse myelitis Active Problems:  Lower paraplegia  NSVT (nonsustained ventricular tachycardia)  Closed right ankle fracture    Acute Transverse Myelitis -Continue high dose steroids. -Have placed CIR consult. -Neurology  following.  NSVT -EF 55-60%. -No further work up. (could have been steroid-induced).  Right Ankle Fracture -As per Dr. August Saucer, plan is for a cast on Wednesday.   Time spent coordinating care: 25 minutes.   LOS: 4 days   HERNANDEZ ACOSTA,ESTELA [facility] Pager: [phone] 04/20/2012, 1:50 PM

## 2012-04-20 NOTE — H&P (Signed)
Physical Medicine and Rehabilitation Admission H&P  Chief Complaint   Patient presents with   .  Numbness   :  HPI: Matthew Juarez is a 52 y.o. right-handed male with history of hypertension as well as chronic back pain. Admitted 04/17/2012 with progressive lower extremity weakness x5 days as well as a fall injuring his right ankle. He denied any chills or fever. He denied any urinary or bowel incontinence but had bouts of constipation. MRI spine showed intrinsic intramedullary cord lesion with prolonged T2 signal and variable abnormal enhancement. Findings most consistent with idiopathic acute transverse myelitis. X-rays of right ankle showed distal fibular fracture. Orthopedic services Dr. August Saucer consulted and advised nonweightbearing right lower extremity with splint applied and plan casting. Neurology services consulted in regards to transverse myelitis and patient placed on intravenous Solu-Medrol. Awaiting CSF results with preliminary report of no growth. Plan 3 days of Solu-Medrol initiated 04/17/2012 and completed 04/20/2012 and then decide about a trial of IVIG or plasmapheresis if no improvement. Echocardiogram with ejection fraction 60% grade 1 diastolic dysfunction. Physical and occupational therapy evaluations completed an ongoing. Recommendations have been made for physical medicine rehabilitation consult to consider inpatient rehabilitation services. Patient was felt to be a good candidate for inpatient rehabilitation services and was admitted for a comprehensive rehabilitation program today.  Review of Systems  Gastrointestinal: Positive for constipation.  Genitourinary:  Constipation  Musculoskeletal: Positive for myalgias, back pain and joint pain.  Neurological: Positive for tingling and weakness.  All other systems reviewed and are negative  Past Medical History   Diagnosis  Date   .  Chronic back pain    .  Hypertension    .  Gout     History reviewed. No pertinent past  surgical history.  Family History   Problem  Relation  Age of Onset   .  Lung cancer  Maternal Uncle    .  Colon cancer  Maternal Aunt     Social History: reports that he has never smoked. He does not have any smokeless tobacco history on file. He reports that he does not drink alcohol or use illicit drugs.  Allergies: No Known Allergies  Medications Prior to Admission   Medication  Sig  Dispense  Refill   .  allopurinol (ZYLOPRIM) 300 MG tablet  Take 300 mg by mouth daily.     Marland Kitchen  amLODipine-olmesartan (AZOR) 5-20 MG per tablet  Take 1 tablet by mouth daily.     .  Cholecalciferol (VITAMIN D PO)  Take 1 tablet by mouth daily.     .  Coenzyme Q10 (CO Q-10) 200 MG CAPS  Take 1 capsule by mouth daily.     .  diphenhydramine-acetaminophen (TYLENOL PM) 25-500 MG TABS  Take 1 tablet by mouth at bedtime.      Home:  Home Living  Lives With: Spouse  Available Help at Discharge: Other (Comment);Available 24 hours/day (family works. 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)  Comments: Armed forces operational officer  Functional Status:  Mobility:  Bed Mobility  Bed Mobility: Not assessed (pt in chair)  Supine to Sit: 4: Min assist  Sitting - Scoot to Edge of Bed: 4: Min assist  Sit to Supine: 4: Min assist;HOB flat;Other (comment) (at LE's)  Scooting to [facility]: 4: Min assist;Other (comment) (to position his L LE)  Transfers  Transfers: Sit to Stand;Stand to  Sit;Lateral/Scoot Transfers  Sit to Stand: 1: +2 Total assist;With upper extremity assist;From bed;From elevated surface  Sit to Stand: Patient Percentage: 40%  Stand to Sit: 1: +2 Total assist;With upper extremity assist;To bed;To elevated surface  Stand to Sit: Patient Percentage: 30%  Squat Pivot Transfers: 3: Mod assist;With upper extremity assistance  Lateral/Scoot Transfers: 1: +2 Total assist;From elevated surface  Lateral Transfers: Patient Percentage: 50%  Ambulation/Gait  Ambulation/Gait Assistance: Not tested (comment)  Stairs: No  Wheelchair Mobility  Wheelchair Mobility: No  ADL:  ADL  Eating/Feeding: Independent  Where Assessed - Eating/Feeding: Chair  Grooming: Set up  Where Assessed - Grooming: Supported sitting  Upper Body Bathing: Set up  Where Assessed - Upper Body Bathing: Supported sitting  Lower Body Bathing: +1 Total assistance  Where Assessed - Lower Body Bathing: Supported sitting;Lean right and/or left  Upper Body Dressing: Minimal assistance  Where Assessed - Upper Body Dressing: Supported sitting  Lower Body Dressing: +1 Total assistance  Where Assessed - Lower Body Dressing: Lean right and/or left;Supported sitting  Toilet Transfer: +2 Total assistance  Toilet Transfer Method: Other (comment) (simulated)  Toilet Transfer Equipment: Other (comment) (chair)  Tub/Shower Transfer: +2 Total assistance  Equipment Used: Gait belt  Transfers/Ambulation Related to ADLs: +2 total A  ADL Comments: lmited by NWB RLE. impaired balanc , coordination and loss of sensation  Cognition:  Cognition  Arousal/Alertness: Awake/alert  Orientation Level: Oriented X4  Cognition  Overall Cognitive Status: Appears within functional limits for tasks assessed/performed  Arousal/Alertness: Awake/alert  Orientation Level: Oriented X4 / Intact  Behavior During Session: [facility] for tasks performed  Blood pressure 112/70, pulse 73, temperature 97.6 F (36.4 C), temperature source  Oral, resp. rate 18, height 6\' 5"  (1.956 m), weight 133.6 kg (294 lb 8.6 oz), SpO2 97.00%.  Physical Exam  Vitals reviewed.  Constitutional: He is oriented to person, place, and time. He appears well-developed.  HENT:  Head: Normocephalic.  Eyes:  Pupils round and reactive to light  Neck: Normal range of motion. Neck supple. No thyromegaly present.  Cardiovascular: Normal rate and regular rhythm. No murmurs Pulmonary/Chest: Effort normal and breath sounds normal. No respiratory distress.  Abdominal: Soft. Bowel sounds are normal. He exhibits no distension. There is no tenderness.  Musculoskeletal:  Right ankle in splint  Neurological: He is alert and oriented to person, place, and time.  Follows full commands. T8 sensory level. Trace to absent sensation on the left below level, slightly diminished sense to PP/LT on right. RLE remains 1 prox to tr-1 distally at the ankle. LLE is 3+ to 4/5 proximal to distal. UE's are 5/5. Cognitively intact. DTR's are 2+. Splint in place right lower extremity  Skin: Skin is warm and dry.  Psychiatric: He has a normal mood and affect. His behavior is normal. Judgment and thought content normal  No results found for this or any previous visit (from the past 48 hour(s)).  No results found.    Post Admission Physician Evaluation:  1. Functional deficits secondary to cervico-thoracic transverse myelitis, T8 with Brown-Squard presentation.. 2. Patient is admitted to  receive collaborative, interdisciplinary care between the physiatrist, rehab nursing staff, and therapy team. 3. Patient's level of medical complexity and substantial therapy needs in context of that medical necessity cannot be provided at a lesser intensity of care such as a SNF. 4. Patient has experienced substantial functional loss from his/her baseline which was documented above under the "Functional History" and "Functional Status" headings. Judging by the patient's diagnosis, physical exam, and  functional history, the patient has potential for functional progress which will result in measurable gains while on inpatient rehab. These gains will be of substantial and practical use upon discharge in facilitating mobility and self-care at the household level. 5. Physiatrist will provide 24 hour management of medical needs as well as oversight of the therapy plan/treatment and provide guidance as appropriate regarding the interaction of the two. 6. 24 hour rehab nursing will assist with bladder management, bowel management, safety, skin/wound care, disease management, medication administration, pain management and patient education and help integrate therapy concepts, techniques,education, etc. 7. PT will assess and treat for: Lower extremity strength, range of motion, stamina, balance, functional mobility, safety, adaptive techniques and equipment, NMR, ortho precautions. Goals are: supervision to min assist at the wheelchair level. 8. OT will assess and treat for: ADL's, functional mobility, safety, upper extremity strength, adaptive techniques and equipment, NMR, education. Goals are: supervision to min/mod assist . 9. SLP will assess and treat for: n/a. Goals are: n/a. 10. Case Management and Social Worker will assess and treat for psychological issues and discharge planning. 11. Team conference will be held weekly to assess progress toward goals and to determine barriers to discharge. 12. Patient will receive at least 3 hours of therapy per day at least 5 days per week. 13. ELOS: 3 weeks Prognosis: excellent Medical Problem List and Plan:  1. cervico-thoracic transverse myelitis, T8 with Brown-Squard presentation. IV Solu-Medrol as indicated  2. Right distal fibular fracture. Patient is nonweightbearing. Await orthopedics to transition from splint to casting on Wednesday  3. DVT Prophylaxis/Anticoagulation: SCDs.Initiate Lovenox therapy  4. Pain Management: Hydrocodone as needed. Monitor with  increased activity  5. Neuropsych: This patient is capable of making decisions on his/her own behalf.  6. Hypertension. Norvasc 5 mg daily, Avapro 150 mg daily. Monitor with increased activity. Normotensive at present 7. History of gout. Allopurinol daily. Monitor for any flareups   Ranelle Oyster, MD, [place] [facility]  04/20/2012

## 2012-04-21 ENCOUNTER — Inpatient Hospital Stay (HOSPITAL_COMMUNITY): Payer: BC Managed Care – PPO | Admitting: Occupational Therapy

## 2012-04-21 ENCOUNTER — Inpatient Hospital Stay (HOSPITAL_COMMUNITY): Payer: BC Managed Care – PPO | Admitting: Physical Therapy

## 2012-04-21 ENCOUNTER — Inpatient Hospital Stay (HOSPITAL_COMMUNITY): Payer: BC Managed Care – PPO | Admitting: *Deleted

## 2012-04-21 ENCOUNTER — Inpatient Hospital Stay (HOSPITAL_COMMUNITY): Payer: BC Managed Care – PPO

## 2012-04-21 DIAGNOSIS — G360 Neuromyelitis optica [Devic]: Secondary | ICD-10-CM

## 2012-04-21 LAB — COMPREHENSIVE METABOLIC PANEL
AST: 68 U/L — ABNORMAL HIGH (ref 0–37)
BUN: 29 mg/dL — ABNORMAL HIGH (ref 6–23)
CO2: 29 mEq/L (ref 19–32)
Calcium: 9.1 mg/dL (ref 8.4–10.5)
Chloride: 104 mEq/L (ref 96–112)
Creatinine, Ser: 1.02 mg/dL (ref 0.50–1.35)
GFR calc Af Amer: >90 mL/min (ref >90)
GFR calc non Af Amer: 82 mL/min — ABNORMAL LOW (ref >90)
Glucose, Bld: 113 mg/dL — ABNORMAL HIGH (ref 70–99)
Sodium: 140 mEq/L (ref 135–145)
Total Bilirubin: 1.1 mg/dL (ref 0.3–1.2)

## 2012-04-21 LAB — CBC WITH DIFFERENTIAL/PLATELET
Basophils Relative: 0 % (ref 0–1)
Eosinophils Absolute: 0 10*3/uL (ref 0.0–0.7)
MCH: 29 pg (ref 26.0–34.0)
MCHC: 33.5 g/dL (ref 30.0–36.0)
Neutrophils Relative %: 76 % (ref 43–77)
Platelets: 148 10*3/uL — ABNORMAL LOW (ref 150–400)
RBC: 4.65 MIL/uL (ref 4.22–5.81)
RDW: 12.7 % (ref 11.5–15.5)

## 2012-04-21 NOTE — Care Management Note (Signed)
    Page 1 of 1   04/21/2012     1:23:37 PM   CARE MANAGEMENT NOTE 04/21/2012  Patient:  RUBEL, HECKARD   Account Number:  [id number]  Date Initiated:  04/20/2012  Documentation initiated by:  Rowena Moilanen  Subjective/Objective Assessment:   PT ADM WITH ANKLE FRACTURE, PARAPLEGIA SECONDARY TO TRANSVERSE MYELITIS.  PTA, PT INDEPENDENT, LIVES WITH WITH.     Action/Plan:   CIR CONSULT IN PROGRESS.  REHAB ADMISSION PENDING INSURANCE AUTHORIZATION.   Anticipated DC Date:  04/20/2012   Anticipated DC Plan:  IP REHAB FACILITY      DC Planning Services  CM consult      Choice offered to / List presented to:             Status of service:  Completed, signed off Medicare Important Message given?   (If response is "NO", the following Medicare IM given date fields will be blank) Date Medicare IM given:   Date Additional Medicare IM given:    Discharge Disposition:  IP REHAB FACILITY  Per UR Regulation:  Reviewed for med. necessity/level of care/duration of stay  If discussed at Long Length of Stay Meetings, dates discussed:    Comments:  04/20/12 Kross Swallows,RN,BSN [phone] PT DISCHARGED TO [facility].

## 2012-04-21 NOTE — Plan of Care (Signed)
Overall Plan of Care [facility]) Patient Details Name: Matthew Juarez MRN: [id number] DOB: 1960-05-12  Diagnosis:  Transverse myelitis   Co-morbidities: right fibular fx, distal; dvt  Functional Problem List  Patient demonstrates impairments in the following areas: Balance, Motor and Sensory   Basic ADL's: grooming, bathing, dressing and toileting Advanced ADL's: light housekeeping  Transfers:  bed mobility, bed to chair, toilet, tub/shower, car and furniture Locomotion:  ambulation, wheelchair mobility and stairs  Additional Impairments:  Leisure Awareness  Anticipated Outcomes Item Anticipated Outcome  Eating/Swallowing    Basic self-care  Mod I -> supervision, min assist for shower transfers  Tolieting  Mod I  Bowel/Bladder    Transfers  Bed to WC mod I, furniture mod I, car supervision  Locomotion  Mod I WC mobility primary means of transport, short distance gait with mod assist secondary ~10' with RW, stairs mod assist LRAD vs ramp  Communication    Cognition    Pain    Safety/Judgment    Other     Therapy Plan: PT Intensity: Minimum of 2-3x/day, 45 to 90 minutes PT Frequency: 5 out of 7 days PT Duration Estimated Length of Stay: 3 weeks OT Intensity: Minimum of 1-2 x/day, 45 to 90 minutes OT Frequency: 5 out of 7 days OT Duration/Estimated Length of Stay: 21-25 days      Team Interventions: Item RN PT OT SLP SW TR Other  Self Care/Advanced ADL Retraining  x x      Neuromuscular Re-Education  x x      Therapeutic Activities  x x      UE/LE Strength Training/ROM  x x      UE/LE Coordination Activities  x x      Visual/Perceptual Remediation/Compensation         DME/Adaptive Equipment Instruction  x x      Therapeutic Exercise  x x      Balance/Vestibular Training  x x      Patient/Family Education  x x      Cognitive Remediation/Compensation         Functional Mobility Training  x x      Ambulation/Gait Training  x x      Stair Training  x         Wheelchair Propulsion/Positioning  x x      Functional Statistician  x       Community Reintegration  x x      Dysphagia/Aspiration Film/video editor         Bladder Management         Bowel Management         Disease Management/Prevention         Pain Management  x x      Medication Management         Skin Care/Wound Management   x      Splinting/Orthotics  x x      Discharge Planning  x x      Psychosocial Support  x x                             Team Discharge Planning: Destination: PT-Home ,OT- Home , SLP-  Projected Follow-up: PT-Home health PT, OT-  Home health OT, SLP-  Projected Equipment Needs: PT-Rolling walker with 5" wheels;Wheelchair (measurements);Wheelchair cushion (measurements);Sliding board, OT- 3 in 1 bedside comode;Tub/shower bench, SLP-  Patient/family  involved in discharge planning: PT- Patient,  OT-Patient;Family member/caregiver, SLP-   MD ELOS: 3+ weeks Medical Rehab Prognosis:  Excellent Assessment: The patient has been admitted for CIR therapies. The team will be addressing, functional mobility, strength, stamina, balance, safety, adaptive techniques/equipment, self-care, bowel and bladder mgt, patient and caregiver education, ortho precautions, NMR, ROM, skin care. Goals have been set at Modified independent, essentially at a wheelchair level. Pt is quite motivated.      See Team Conference Notes for weekly updates to the plan of care

## 2012-04-21 NOTE — Progress Notes (Signed)
Patient information reviewed and entered into eRehab system by Tenzin Edelman, RN, CRRN, PPS Coordinator.  Information including medical coding and functional independence measure will be reviewed and updated through discharge.    

## 2012-04-21 NOTE — Evaluation (Signed)
Physical Therapy Assessment and Plan  Patient Details  Name: Matthew Juarez MRN: [id number] Date of Birth: 11-17-1960  PT Diagnosis: Abnormality of gait, Ataxia, Difficulty walking, Impaired sensation and Muscle weakness Rehab Potential: Good ELOS: 3 weeks   Today's Date: 04/21/2012 Time: 1191-4782 Time Calculation (min): 58 min  Problem List:  Patient Active Problem List  Diagnosis  . Chronic back pain  . Lower paraplegia  . Acute transverse myelitis  . NSVT (nonsustained ventricular tachycardia)  . Closed right ankle fracture  . Transverse myelitis    Past Medical History:  Past Medical History  Diagnosis Date  . Chronic back pain   . Hypertension   . Gout   . Headache   . Arthritis    Past Surgical History: No past surgical history on file.  Assessment & Plan Clinical Impression: Matthew Juarez is a 52 y.o. right-handed male with history of hypertension as well as chronic back pain. Admitted 04/17/2012 with progressive lower extremity weakness x5 days as well as a fall injuring his right ankle. He denied any chills or fever. He denied any urinary or bowel incontinence but had bouts of constipation. MRI spine showed intrinsic intramedullary cord lesion with prolonged T2 signal and variable abnormal enhancement. Findings most consistent with idiopathic acute transverse myelitis. X-rays of right ankle showed distal fibular fracture. Orthopedic services Dr. August Saucer consulted and advised nonweightbearing right lower extremity with splint applied and plan casting. Neurology services consulted in regards to transverse myelitis and patient placed on intravenous Solu-Medrol. Awaiting CSF results with preliminary report of no growth. Plan 3 days of Solu-Medrol initiated 04/17/2012 and completed 04/20/2012 and then decide about a trial of IVIG or plasmapheresis if no improvement.  Patient transferred to CIR on 04/20/2012 .   Patient currently requires mod with mobility secondary to muscle  weakness and impaired timing and sequencing, ataxia and decreased coordination.  Prior to hospitalization, patient was independent, working full time with mobility and lived with Spouse in a House home.  Home access is 5 front entrance, 3-4 back entranceStairs to enter.  Patient will benefit from skilled PT intervention to maximize safe functional mobility, minimize fall risk and decrease caregiver burden for planned discharge home with intermittent assist.  Anticipate patient will benefit from follow up [facility] at discharge.  PT - End of Session Activity Tolerance: Tolerates 30+ min activity with multiple rests Endurance Deficit: Yes Endurance Deficit Description: generalized weakness exacerbated by right leg weakness PT Assessment Rehab Potential: Good Barriers to Discharge: Inaccessible home environment PT Plan PT Intensity: Minimum of 2-3x/day, 45 to 90 minutes PT Frequency: 5 out of 7 days PT Duration Estimated Length of Stay: 3 weeks PT Treatment/Interventions: Ambulation/gait training;Balance/vestibular training;Community reintegration;Discharge planning;DME/adaptive equipment instruction;Functional electrical stimulation;Functional mobility training;Neuromuscular re-education;Pain management;Patient/family education;Splinting/orthotics;Stair training;Therapeutic Activities;Therapeutic Exercise;UE/LE Strength taining/ROM;UE/LE Coordination activities;Wheelchair propulsion/positioning PT Recommendation Follow Up Recommendations: Home health PT Patient destination: Home Equipment Recommended: Rolling walker with 5" wheels;Wheelchair (measurements);Wheelchair cushion (measurements);Sliding board Equipment Details: 20x 20 WC with extra long elevating leg rests (pt is 6'5"), long slode board, and thicker pressure relief cushion  PT Evaluation Precautions/Restrictions Precautions Precautions: Fall Other Brace/Splint: hard splint -> RLE Restrictions RLE Weight Bearing: Non weight  bearing General Family/Caregiver Present: Yes Vital Signs  Pain Pain Assessment Pain Assessment: No/denies pain Home Living/Prior Functioning Home Living Lives With: Spouse Available Help at Discharge: Family;Available PRN/intermittently (wife states she will not be available 24/7) Type of Home: House Home Access: Stairs to enter [employer] of Steps: 5 front entrance, 3-4 back  entrance Entrance Stairs-Rails: Right;Left;Can reach both Home Layout: One level Bathroom Shower/Tub: Walk-in shower;Door;Tub/shower unit;Curtain Bathroom Toilet: Standard Bathroom Accessibility: Yes How Accessible: Accessible via walker Home Adaptive Equipment: Hand-held shower hose Prior Function Level of Independence: Independent with basic ADLs;Independent with homemaking with ambulation Driving: Yes Vocation: Full time employment Vocation Requirements: [employer], physical job Leisure: Hobbies-yes (Comment) Comments: go to the movies, go out to eat, go shopping, travel, go visit friends, go to the park and walk.   Vision/Perception  Vision - History Baseline Vision: No visual deficits  Cognition Overall Cognitive Status: Appears within functional limits for tasks assessed Arousal/Alertness: Awake/alert Sensation Sensation Light Touch: Impaired Detail Light Touch Impaired Details: Impaired RLE Additional Comments: right leg from buttocks down to toes deminished sensation compared to left side, but is still able to feel touch and determine where I was touching his leg without looking.   Coordination Fine Motor Movements are Fluid and Coordinated: Yes Heel Shin Test: Pt with decreased coordination of left leg when moving, unable to test right due to not strong enough. Motor  Motor Motor: Ataxia Motor - Skilled Clinical Observations: ataxic movements of left leg  Mobility Bed Mobility Supine to Sit: 6: Modified independent (Device/Increase time);With rails;HOB flat Sitting -  Scoot to Edge of Bed: 6: Modified independent (Device/Increase time);With rail Sit to Supine: 4: Min assist;With rail;HOB flat Sit to Supine - Details (indicate cue type and reason): min assist to help pick right leg up into the bed.   Scooting to [facility]: 6: Modified independent (Device/Increase time);With rail Transfers Sit to Stand: 4: Min assist;With upper extremity assist;From elevated surface Sit to Stand Details: Verbal cues for technique;Verbal cues for sequencing;Verbal cues for precautions/safety Sit to Stand Details (indicate cue type and reason): verbal cues for safe hand placement, assist to stabilize RW and assist to position left knee.  Maximally elevated bed due to pt is 6'5" tall. Stand to Sit: 4: Min assist;With upper extremity assist;With armrests;To elevated surface Lateral/Scoot Transfers: 4: Min assist;From elevated surface;With armrests removed Lateral/Scoot Transfer Details: Verbal cues for sequencing;Verbal cues for technique;Verbal cues for precautions/safety Lateral/Scoot Transfer Details (indicate cue type and reason): scoot pivot high bed to low WC no armrests min assist to stabilize WC, low WC to high bed, mod assist to prevent LOB forward to floor.   Locomotion  Ambulation Ambulation: No Ambulation/Gait Assistance: Not tested (comment) Naval architect Mobility: Yes Wheelchair Assistance: 5: Supervision Wheelchair Assistance Details: Verbal cues for sequencing;Verbal cues for technique;Verbal cues for Engineer, drilling: Both upper extremities Wheelchair Parts Management: Supervision/cueing Distance: 10  Trunk/Postural Counsellor Sitting - Balance Support: No upper extremity supported;Feet supported Static Sitting - Level of Assistance: 5: Stand by assistance Dynamic Sitting Balance Dynamic Sitting - Balance Support: No upper extremity supported;Feet supported Dynamic Sitting -  Level of Assistance: 5: Stand by assistance Static Standing Balance Static Standing - Balance Support: Bilateral upper extremity supported Static Standing - Level of Assistance: 4: Min assist Static Standing - Comment/# of Minutes: min assist to stabilize for balance once standing, the trouble is that pt is unable to truely maintain NWB right leg during standing.   Extremity Assessment      RLE Assessment RLE Assessment: Exceptions to [facility] RLE Strength RLE Overall Strength Comments: able to wiggle toes, ankle NT due to hard splint, knee trace, hip trace LLE Assessment LLE Assessment: Within Functional Limits  FIM:  FIM - Banker  Devices: Bed rails;Arm rests Bed/Chair Transfer: 4: Supine > Sit: Min A (steadying Pt. > 75%/lift 1 leg);3: Sit > Supine: Mod A (lifting assist/Pt. 50-74%/lift 2 legs);4: Bed > Chair or W/C: Min A (steadying Pt. > 75%);3: Chair or W/C > Bed: Mod A (lift or lower assist) FIM - Locomotion: Wheelchair Distance: 10 Locomotion: Wheelchair: 1: Travels less than 50 ft with minimal assistance (Pt.>75%) FIM - Locomotion: Ambulation Ambulation/Gait Assistance: Not tested (comment) Locomotion: Ambulation: 0: Activity did not occur FIM - Locomotion: Stairs Locomotion: Stairs: 0: Activity did not occur   Refer to Care Plan for Long Term Goals  Recommendations for other services: Other: Recreation therapy eval  Discharge Criteria: Patient will be discharged from PT if patient refuses treatment 3 consecutive times without medical reason, if treatment goals not met, if there is a change in medical status, if patient makes no progress towards goals or if patient is discharged from hospital.  The above assessment, treatment plan, treatment alternatives and goals were discussed and mutually agreed upon: by patient and by family  Lurena Joiner B. Nala Kachel, PT, DPT [phone]   04/21/2012, 2:01 PM

## 2012-04-21 NOTE — Progress Notes (Signed)
Occupational Therapy Session Note  Patient Details  Name: Matthew Juarez MRN: [id number] Date of Birth: 1960/04/28  Today's Date: 04/21/2012 Time: 1133-1203 Time Calculation (min): 30 min  Short Term Goals: Week 1:  OT Short Term Goal 1 (Week 1): Patient will donn pants at bed level with min assist using AE prn OT Short Term Goal 2 (Week 1): Patient will engage in bed mobility with min assist in order to donn shirt sitting edge of bed OT Short Term Goal 3 (Week 1): Toilet transfer <-> [facility] will be attempted with patient using AE prn OT Short Term Goal 4 (Week 1): Patient will complete grooming tasks seated in w/c at sink with set-up assist  Skilled Therapeutic Interventions/Progress Updates:  Worked on sit to stand transitions from wheelchair, using RW for UE support.  Pt needed total assist to stand and transition UEs to the walker.  In standing demonstrates increased LOB posteriorly and inability to achieve and maintain neutral hip extension in standing.  Only able to stand for 15 seconds to 1 minute.  Unable to maintain NWBing in the RLE secondary to muscle weakness and not being able to keep it off of the floor.  On 3 occasions pt's left knee buckled and he collapsed into the wheelchair.  Feel he is not ready for sit to stand and standing selfcare tasks at this time.   Therapy Documentation Precautions:  Precautions Precautions: Fall Required Braces or Orthoses: Other Brace/Splint Other Brace/Splint: hard splint -> RLE Restrictions Weight Bearing Restrictions: Yes RLE Weight Bearing: Non weight bearing LLE Weight Bearing: Weight bearing as tolerated   Pain: Pain Assessment Pain Assessment: No/denies pain Pain Score: 0-No pain ADL: See FIM for current functional status  Therapy/Group: Individual Therapy  Verenis Nicosia OTR/L 04/21/2012, 12:23 PM

## 2012-04-21 NOTE — Evaluation (Signed)
Occupational Therapy Assessment and  & Session Note  Patient Details  Name: Matthew Juarez MRN: [id number] Date of Birth: [birth year]  OT Diagnosis: abnormal posture, muscle weakness (generalized) and paraparesis at level T2 Rehab Potential: Rehab Potential: Excellent ELOS: 21-25 days   Today's Date: 04/21/2012  Problem List:  Patient Active Problem List  Diagnosis  . Chronic back pain  . Lower paraplegia  . Acute transverse myelitis  . NSVT (nonsustained ventricular tachycardia)  . Closed right ankle fracture  . Transverse myelitis    Past Medical History:  Past Medical History  Diagnosis Date  . Chronic back pain   . Hypertension   . Gout   . Headache   . Arthritis    Past Surgical History: No past surgical history on file.  Clinical Impression: Matthew Juarez is a 52 y.o. right-handed male with history of hypertension as well as chronic back pain. Admitted 04/17/2012 with progressive lower extremity weakness x5 days as well as a fall injuring his right ankle. He denied any chills or fever. He denied any urinary or bowel incontinence but had bouts of constipation. MRI spine showed intrinsic intramedullary cord lesion with prolonged T2 signal and variable abnormal enhancement. Findings most consistent with idiopathic acute transverse myelitis. X-rays of right ankle showed distal fibular fracture. Orthopedic services Dr. August Saucer consulted and advised nonweightbearing right lower extremity with splint applied and plan casting. Neurology services consulted in regards to transverse myelitis and patient placed on intravenous Solu-Medrol. Awaiting CSF results with preliminary report of no growth. Plan 3 days of Solu-Medrol initiated 04/17/2012 and completed 04/20/2012 and then decide about a trial of IVIG or plasmapheresis if no improvement. Echocardiogram with ejection fraction 60% grade 1 diastolic dysfunction. Physical and occupational therapy evaluations completed an ongoing.  Recommendations have been made for physical medicine rehabilitation consult to consider inpatient rehabilitation services. Patient was felt to be a good candidate for inpatient rehabilitation services and was admitted for a comprehensive rehabilitation program today. Patient transferred to CIR on 04/20/2012 .    Patient currently requires min-total assist with basic self-care skills and IADL secondary to muscle weakness, muscle joint tightness and muscle paralysis and decreased sitting balance, decreased standing balance, decreased postural control and decreased balance strategies.  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  Patient will benefit from skilled intervention to increase independence with basic self-care skills prior to discharge home with care partner.  Anticipate patient will require intermittent supervision and follow up home health.  OT - End of Session Activity Tolerance: Tolerates 10 - 20 min activity with multiple rests Endurance Deficit: Yes OT Assessment Rehab Potential: Excellent Barriers to Discharge: Decreased caregiver support Barriers to Discharge Comments: family discussed building ramp for back entrance OT Plan OT Intensity: Minimum of 1-2 x/day, 45 to 90 minutes OT Frequency: 5 out of 7 days OT Duration/Estimated Length of Stay: 21-25 days OT Treatment/Interventions: Metallurgist training;Community reintegration;Discharge planning;DME/adaptive equipment instruction;Functional mobility training;Neuromuscular re-education;Pain management;Patient/family education;Psychosocial support;Self Care/advanced ADL retraining;Skin care/wound managment;Splinting/orthotics;Therapeutic Activities;Therapeutic Exercise;UE/LE Strength taining/ROM;UE/LE Coordination activities;Wheelchair propulsion/positioning OT Recommendation Patient destination: Home Follow Up Recommendations: Home health  OT Equipment Recommended: 3 in 1 bedside comode;Tub/shower bench  Precautions/Restrictions  Precautions Precautions: Fall Required Braces or Orthoses: Other Brace/Splint Other Brace/Splint: hard splint -> RLE Restrictions Weight Bearing Restrictions: Yes RLE Weight Bearing: Non weight bearing LLE Weight Bearing: Weight bearing as tolerated  General Chart Reviewed: Yes Family/Caregiver Present: Yes (Wife)  Pain Pain Assessment Pain  Assessment: No/denies pain Pain Score: 0-No pain  Home Living/Prior Functioning Home Living Lives With: Spouse Available Help at Discharge: Family;Available PRN/intermittently (wife states she will not be available 24/7) Type of Home: House Home Access: Stairs to enter [employer] of Steps: 5 front entrance, 3-4 back entrance Entrance Stairs-Rails: Right;Left;Can reach both Home Layout: One level Bathroom Shower/Tub: Walk-in shower;Door;Tub/shower unit;Curtain Firefighter: Standard Bathroom Accessibility: Yes How Accessible: Accessible via walker Home Adaptive Equipment: Hand-held shower hose IADL History Homemaking Responsibilities: Yes Meal Prep Responsibility: Secondary Laundry Responsibility: Secondary Cleaning Responsibility: Primary Shopping Responsibility: Secondary Current License: Yes Education: Hospital doctor to [employer] Occupation: Full time employment Type of Occupation: Works for [employer]; [employer] Leisure and Hobbies: Go to [employer], Engineer, structural, Go out to eat, Go out with friends, Exercise walk IADL Comments: Patient and wife share IADL responsibilities Prior Function Level of Independence: Independent with basic ADLs;Independent with homemaking with ambulation Driving: Yes Vocation: Full time employment Vocation Requirements: [employer], physical job Leisure: Hobbies-yes (Comment) Comments: go to the movies, go out to eat, go shopping, travel, go visit friends, go to the park and walk.    ADL - See  FIM  Vision/Perception  Vision - History Baseline Vision: No visual deficits Patient Visual Report: No change from baseline Vision - Assessment Eye Alignment: Within Functional Limits Vision Assessment: Vision not tested Perception Perception: Within Functional Limits Praxis Praxis: Intact   Cognition Overall Cognitive Status: Appears within functional limits for tasks assessed Arousal/Alertness: Awake/alert Orientation Level: Oriented X4 Memory: Appears intact Awareness: Appears intact Problem Solving: Appears intact Safety/Judgment: Appears intact  Sensation Sensation Additional Comments: bilateral UEs appear intact Coordination Gross Motor Movements are Fluid and Coordinated: Yes Fine Motor Movements are Fluid and Coordinated: Yes  Mobility  Bed Mobility Supine to Sit: 6: Modified independent (Device/Increase time);With rails;HOB flat Sitting - Scoot to Edge of Bed: 6: Modified independent (Device/Increase time);With rail Sit to Supine: 4: Min assist;With rail;HOB flat Sit to Supine - Details (indicate cue type and reason): min assist to help pick right leg up into the bed.   Scooting to [facility]: 6: Modified independent (Device/Increase time);With Investment banker, corporate Sitting Balance Static Sitting - Balance Support: No upper extremity supported;Feet supported Static Sitting - Level of Assistance: 5: Stand by assistance Dynamic Sitting Balance Dynamic Sitting - Balance Support: No upper extremity supported;Feet supported Dynamic Sitting - Level of Assistance: 5: Stand by assistance  Extremity/Trunk Assessment RUE Assessment RUE Assessment: Within Functional Limits (can benefit from strengthening) LUE Assessment LUE Assessment: Within Functional Limits (can benefit from strengthening)  See FIM for current functional status  Refer to Care Plan for Long Term Goals  Recommendations for other services: None  Discharge Criteria: Patient will be discharged from OT  if patient refuses treatment 3 consecutive times without medical reason, if treatment goals not met, if there is a change in medical status, if patient makes no progress towards goals or if patient is discharged from hospital.  The above assessment, treatment plan, treatment alternatives and goals were discussed and mutually agreed upon: by patient  ----------------------------------------------------------------------------------------  SESSION NOTE  4098-1191 - 55 Minutes Individual Therapy No complaints of pain Initial 1:1 occupational therapy evaluation completed. Focused skilled intervention on bed mobility, UB/LB dressing, dynamic sitting balance edge of bed, and grooming tasks seated edge of bed. Patient left seated edge of bed at end of session with bed side table in front and call bell & phone within reach.   Matthew Juarez 04/21/2012, 11:16 AM

## 2012-04-21 NOTE — Progress Notes (Signed)
Occupational Therapy Note  Patient Details  Name: Matthew Juarez MRN: [id number] Date of Birth: 1960-06-04 Today's Date: 04/21/2012  Time: 1300-1345 Pt denies pain Individual therapy Pt resting in w/c upon arrival.  After explaining goal of therapy session pt practiced sliding board transfer w/c<>drop arm BSC X 3.  Pt required assistance managing RLE and stabilizing equipment.  Pt required mod assistance and verbal cues for technique.  Discussed clothing management technique with patient but did not practice secondary to time limitations.    Lavone Neri [facility] 04/21/2012, 2:34 PM

## 2012-04-21 NOTE — Progress Notes (Signed)
Subjective/Complaints: Had a comfortable night. Mild pain when lying on his right side. Ankle not tender. Emptying bladder. Had a bm yesterday. A 12 point review of systems has been performed and if not noted above is otherwise negative.   Objective: Vital Signs: Blood pressure 122/71, pulse 74, temperature 98.2 F (36.8 C), temperature source Oral, resp. rate 18, height 6\' 5"  (1.956 m), weight 133.6 kg (294 lb 8.6 oz), SpO2 98.00%. No results found.  Basename 04/21/12 0610  WBC 7.8  HGB 13.5  HCT 40.3  PLT 148*    Basename 04/21/12 0610  NA 140  K 4.3  CL 104  GLUCOSE 113*  BUN 29*  CREATININE 1.02  CALCIUM 9.1   CBG (last 3)  No results found for this basename: GLUCAP:3 in the last 72 hours  Wt Readings from Last 3 Encounters:  04/20/12 133.6 kg (294 lb 8.6 oz)  04/17/12 133.6 kg (294 lb 8.6 oz)  12/05/11 139.708 kg (308 lb)    Physical Exam:  Constitutional: He is oriented to person, place, and time. He appears well-developed.  HENT:  Head: Normocephalic.  Eyes:  Pupils round and reactive to light  Neck: Normal range of motion. Neck supple. No thyromegaly present.  Cardiovascular: Normal rate and regular rhythm. No murmurs  Pulmonary/Chest: Effort normal and breath sounds normal. No respiratory distress.  Abdominal: Soft. Bowel sounds are normal. He exhibits no distension. There is no tenderness.  Musculoskeletal:  Right ankle in splint  Neurological: He is alert and oriented to person, place, and time.  Follows full commands. T8 sensory level. Trace to absent sensation on the left below level, slightly diminished sense to PP/LT on right.(does have paresthesias) RLE remains 1 prox to tr-1 distally at the ankle. LLE is 3+ to 4/5 proximal to distal. UE's are 5/5. Cognitively intact. DTR's are 2+. Splint in place right lower extremity  Skin: Skin is warm and dry.  Psychiatric: He has a normal mood and affect. His behavior is normal. Judgment and thought content  normal       Assessment/Plan: 1. Functional deficits secondary to cervico-thoracic transverse myelitis (clinically T8 level) with Brown-Sequard presentation which require 3+ hours per day of interdisciplinary therapy in a comprehensive inpatient rehab setting. Physiatrist is providing close team supervision and 24 hour management of active medical problems listed below. Physiatrist and rehab team continue to assess barriers to discharge/monitor patient progress toward functional and medical goals. FIM:                   Comprehension Comprehension Mode: Auditory Comprehension: 7-Follows complex conversation/direction: With no assist  Expression Expression Mode: Verbal Expression: 7-Expresses complex ideas: With no assist  Social Interaction Social Interaction: 7-Interacts appropriately with others - No medications needed.  Problem Solving Problem Solving: 7-Solves complex problems: Recognizes & self-corrects  Memory Memory: 7-Complete Independence: No helper  Medical Problem List and Plan:  1. cervico-thoracic transverse myelitis, T8 with Brown-Squard presentation. IV Solu-Medrol as indicated  2. Right distal fibular fracture. Patient is nonweightbearing. Await orthopedics to transition from splint to casting on Wednesday  3. DVT Prophylaxis/Anticoagulation: SCDs. lovenox 30 q12. Check dopplers  4. Pain Management: Hydrocodone as needed. Monitor with increased activity  5. Neuropsych: This patient is capable of making decisions on his/her own behalf.  6. Hypertension. Norvasc 5 mg daily, Avapro 150 mg daily. Monitor with increased activity. Normotensive at present  7. History of gout. Allopurinol daily. Monitor for any flareups  8. Prerenal azotemia: push liquids  LOS (Days)  1 A FACE TO FACE EVALUATION WAS PERFORMED  Starkisha Tullis T 04/21/2012 8:37 AM

## 2012-04-21 NOTE — Progress Notes (Signed)
Addendum to CIR preadmission form: Insurance note: Water quality scientist authorization # [id number], with update due 04/27/12 to [place]: Fax#[phone]; Phone#[phone].  Benefits are as follows: Primary:BCBS Policy#: [id number] Subscriber: Patient Benefits: [phone] Effective Date: 03/31/12 Deductible: $933 ($723.19 met) OOP OZD:$6644 Life Max: Unlimited CIR: requires preauth / $291 per admission / 70/30% SNF: requires preauth /70/30%   LBD: n/a Outpatient: 70/30% Copay:$35  Home Health: 70/30% DME: 70/30%  Providers: Patient's choice

## 2012-04-22 ENCOUNTER — Inpatient Hospital Stay (HOSPITAL_COMMUNITY): Payer: BC Managed Care – PPO | Admitting: Occupational Therapy

## 2012-04-22 ENCOUNTER — Inpatient Hospital Stay (HOSPITAL_COMMUNITY): Payer: BC Managed Care – PPO

## 2012-04-22 ENCOUNTER — Inpatient Hospital Stay (HOSPITAL_COMMUNITY): Payer: BC Managed Care – PPO | Admitting: Physical Therapy

## 2012-04-22 ENCOUNTER — Inpatient Hospital Stay (HOSPITAL_COMMUNITY): Payer: BC Managed Care – PPO | Admitting: *Deleted

## 2012-04-22 DIAGNOSIS — I82409 Acute embolism and thrombosis of unspecified deep veins of unspecified lower extremity: Secondary | ICD-10-CM

## 2012-04-22 MED ORDER — GABAPENTIN 100 MG PO CAPS
100.0000 mg | ORAL_CAPSULE | Freq: Three times a day (TID) | ORAL | Status: DC
  Administered 2012-04-22 – 2012-04-24 (×9): 100 mg via ORAL
  Filled 2012-04-22 (×14): qty 1

## 2012-04-22 MED ORDER — ENOXAPARIN SODIUM 150 MG/ML ~~LOC~~ SOLN
1.0000 mg/kg | Freq: Two times a day (BID) | SUBCUTANEOUS | Status: DC
  Administered 2012-04-22 – 2012-04-26 (×10): 135 mg via SUBCUTANEOUS
  Filled 2012-04-22 (×12): qty 1

## 2012-04-22 NOTE — Progress Notes (Signed)
Social Work  Social Work Assessment and Plan  Patient Details  Name: Matthew Juarez MRN: [id number] Date of Birth: 05/20/1960  Today's Date: 04/22/2012  Problem List:  Patient Active Problem List  Diagnosis  . Chronic back pain  . Lower paraplegia  . Acute transverse myelitis  . NSVT (nonsustained ventricular tachycardia)  . Closed right ankle fracture  . Transverse myelitis   Past Medical History:  Past Medical History  Diagnosis Date  . Chronic back pain   . Hypertension   . Gout   . Headache   . Arthritis    Past Surgical History: No past surgical history on file. Social History:  reports that he has never smoked. He does not have any smokeless tobacco history on file. He reports that he drinks alcohol. He reports that he does not use illicit drugs.  Family / Support Systems Marital Status: Married Patient Roles: Spouse;Parent (Pt has a son in town.) Spouse/Significant Other: wife, Daeshon Grammatico @ (H) [phone] or ([phone] Children: son, Debby Bud (only child) living locally and attending college Other Supports: sister, Dornell Grasmick @ [phone] and Pt's mother, Hammad Finkler @ [phone] Anticipated Caregiver: Wife: Catering manager Ability/Limitations of Caregiver: Min assist - wife does work full-time as a Midwife (shift is split) Medical laboratory scientific officer: Intermittent (Wife works full-time) Family Dynamics: Pt describes very supportive family - notes mother in good health and sister lives closeby - both can offer support as well.  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  Reports feeling he has emotional stamina to deal/ cope with circumstances.  Denies any s/s of depression or anxiety.  BDI screen = 0.  Very motivated and reports family is encouraging to him as well. Recent Psychosocial Issues: None Pyschiatric History: None - note BDI screening indicated no s/s of depression = 0 Substance Abuse History: none  Patient / Family Perceptions, Expectations & Goals Pt/Family understanding of illness & functional limitations: Pt able to give good explaination of his diagnosis as he understands it from MDs.  Good understanding of his functional limitations.  Notes MDs seem uncertain of his longer term prognosis. Premorbid pt/family roles/activities: Very independent and active with work, church and family life. Anticipated changes in roles/activities/participation: Pt will likely requrie some caregiver assistance and physical assistance initially upon d/c.  Wife anticipates being primary caregiver. Pt/family expectations/goals: "I really don't know what to expect.  Want to get stronger."  [employer]: None Premorbid Home Care/DME Agencies: None Transportation available at discharge: yes Resource referrals recommended: Neuropsychology;Support group (specify)  Discharge Planning Living Arrangements: Spouse/significant other Support Systems: Spouse/significant other;Children;Parent;Other relatives;Friends/neighbors;Church/faith community Type of Residence: Private residence Insurance Resources: Media planner (specify) Chief Executive Officer) Financial Resources: Employment Financial Screen Referred:  No Living Expenses: Database administrator Management: Patient Do you have any problems obtaining your medications?: No Home Management: pt and wife Patient/Family Preliminary Plans: Pt plans  to d/c home with wife - other family to provide intermittent support. Barriers to Discharge: Steps;Family Support (wife does work - ) Social Work Anticipated Follow Up Needs: HH/OP Expected length of stay: 2-3 weeks  Clinical Impression Very pleasant gentleman here after diagnosis of Transverse Myelitis.  Very realistic about goals for CIR and likely 2-3 week stay.  Motivated for CIR and with good family support.  No significant emotional distress noted, however, will monitor throughout his stay.  Toniann Dickerson 04/22/2012, 3:31 PM

## 2012-04-22 NOTE — Progress Notes (Signed)
VASCULAR LAB PRELIMINARY  PRELIMINARY  PRELIMINARY  PRELIMINARY  Bilateral lower extremity venous duplex  completed.    Preliminary report:  Right:  DVT noted in the distal popliteal vein.  No evidence of superficial thrombosis.  No Baker's cyst.Left:  No evidence of DVT, superficial thrombosis, or Baker's cyst.  Matthew Juarez, RVT 04/22/2012, 8:34 AM

## 2012-04-22 NOTE — Progress Notes (Signed)
Occupational Therapy Session Note  Patient Details  Name: Matthew Juarez MRN: [id number] Date of Birth: 09-24-1960  Today's Date: 04/22/2012 Time: 8657-8469 Time Calculation (min): 28 min  Short Term Goals: Week 1:  OT Short Term Goal 1 (Week 1): Patient will donn pants at bed level with min assist using AE prn OT Short Term Goal 2 (Week 1): Patient will engage in bed mobility with min assist in order to donn shirt sitting edge of bed OT Short Term Goal 3 (Week 1): Toilet transfer <-> [facility] will be attempted with patient using AE prn OT Short Term Goal 4 (Week 1): Patient will complete grooming tasks seated in w/c at sink with set-up assist  Skilled Therapeutic Interventions/Progress Updates:    Pt performed supine to sit with mod assist to move the RLE.  He was able to transfer to the wheelchair using the sliding board with min assist after placement.  Took pt to the OT gym and used the UE ergonometer for UE strengthening and endurance.  Pt performed 4 mins peddling forward for 4 mins on level 13 resistance.  After a 3 minute break performed peddling reverse for 3 mins on level 13 before fatiguing and needing rest.  Issued strapping and velcro to help keep the RLE on the elevated leg rest.  Therapy Documentation Precautions:  Precautions Precautions: Fall Precaution Comments: loss of sensation or proprioception (LLE worse than RLE) with loss of strength R>L (trace to no strength in right leg all the way up to hip) Required Braces or Orthoses: Other Brace/Splint Other Brace/Splint: hard splint -> RLE Restrictions Weight Bearing Restrictions: Yes RLE Weight Bearing: Non weight bearing LLE Weight Bearing: Weight bearing as tolerated  Pain: Pain Assessment Pain Assessment: No/denies pain Pain Score: 0-No pain ADL: See FIM for current functional status  Therapy/Group: Individual Therapy  Deronda Christian OTR/L 04/22/2012, 3:56 PM

## 2012-04-22 NOTE — Progress Notes (Signed)
Patient's blood pressure is 98/58, manually, heart rate 75; patient asymptomatic.  Notified Dan Angiulli, PA of blood pressure and patient's blood pressure medication.  Advised to give medication as scheduled.  Will continue to monitor.

## 2012-04-22 NOTE — Progress Notes (Signed)
Physical Therapy Session Note  Patient Details  Name: Matthew Juarez MRN: [id number] Date of Birth: 1960/06/16  Today's Date: 04/22/2012 Time: 1103-1212 Time Calculation (min): 69 min  Short Term Goals: Week 1:  PT Short Term Goal 1 (Week 1): bed mobility supervision using assistive devices or compensatory techniques to move right leg PT Short Term Goal 2 (Week 1): slide board transfers to both left and right on level surfaces min assist both ways with pt independently placing board.   PT Short Term Goal 3 (Week 1): WC mobility >150' mod I for driving with assist only for parts management.   PT Short Term Goal 4 (Week 1): Sit to stand in parallel bars with two person assist (mod overall level) someone to stabilize chair someone to stabilize pt.   PT Short Term Goal 5 (Week 1): Stand for >5 mins with LRAD and NWB right foot for improved strength, endurance, left leg WB and activity tolerance.   Skilled Therapeutic Interventions/Progress Updates:    This session focused on WC mobility to gym from room >150' with supervision.  Gait belt needed around rigged leg rest to keep leg from falling off.  The leg rest may need more securing with more tape at a later time.  Sit to stand in parallel bars x 3 with max assist x1, total assist +2 to prevent a large fall x1 and total assist +2 successfully x 1.  The first stand was successful, but the [facility] moved from behind [place] a little due to pt's left leg was using the locked WC for stability in standing.  Uncontrolled descent to sit x2 until the last stand when someone was securing the [facility] and this therapist was controlling the unlocking of his left knee as he went to sit was it controlled.  I would only try standing in the parallel bars again with a second person helping to stabilize the [facility] because we did almost have a very big fall due to the chair moving.  Transfer practice with slide board min assist to help position and stabilize chair and slide board transferring  to the left, mod assist to transfer to the right due to management of his right leg, board placement under that thigh was more difficult.  Pt managing his trunk on his own both directions.  Theses were level transfers x 3 each direction.  From mat table utilizing the sara lift we were able to stand safely and for longer and still keep right leg NWB (kicked out to the side, outside of the sara lift knee pads).  Stood x 2, max stand 4 mins.  Knee still uncontrollably buckles when going from standing to sit with the sara lift.  May be good to work on terminal knee work in Air cabin crew in standing for knee control next session.    Therapy Documentation Precautions:  Precautions Precautions: Fall Precaution Comments: loss of sensation or proprioception (LLE worse than RLE) with loss of strength R>L (trace to no strength in right leg all the way up to hip) Required Braces or Orthoses: Other Brace/Splint Other Brace/Splint: hard splint -> RLE Restrictions Weight Bearing Restrictions: Yes RLE Weight Bearing: Non weight bearing LLE Weight Bearing: Weight bearing as tolerated General: Amount of Missed PT Time (min): 20 Minutes Missed Time Reason: Unavailable (comment) (Pt out for procedure - doppler) Vital Signs: Therapy Vitals BP: 98/58 mmHg Pain: Pain Assessment Pain Assessment: No/denies pain Pain Score: 0-No pain Mobility:   Locomotion : Wheelchair Mobility Distance: 150  See FIM for current functional status  Therapy/Group: Individual Therapy  Lurena Joiner B. Evellyn Tuff, PT, DPT [phone]   04/22/2012, 12:32 PM

## 2012-04-22 NOTE — Progress Notes (Signed)
[facility]patient Rehabilitation [facility] Individual Statement of Services  Patient Name:  Matthew Juarez  Date:  04/22/2012  Welcome to the [facility]patient [facility].  Our goal is to provide you with an individualized program based on your diagnosis and situation, designed to meet your specific needs.  With this comprehensive rehabilitation program, you will be expected to participate in at least 3 hours of rehabilitation therapies Monday-Friday, with modified therapy programming on the weekends.  Your rehabilitation program will include the following services:  Physical Therapy (PT), Occupational Therapy (OT), 24 hour per day rehabilitation nursing, Therapeutic Recreaction (TR), Neuropsychology, Case Management  (Social Worker), Rehabilitation Medicine, Nutrition Services and Pharmacy Services  Weekly team conferences will be held on Tuesdays to discuss your progress.  Your  Social Worker will talk with you frequently to get your input and to update you on team discussions.  Team conferences with you and your family in attendance may also be held.  Expected length of stay: 2-3 weeks  Overall anticipated outcome: modified independent to                                                                                                                        Moderate assistance  Depending on your progress and recovery, your program may change.  Your  Social Worker will coordinate services and will keep you informed of any changes.  Your  Social Worker's name and contact numbers are listed  below.  The following services may also be recommended but are not provided by the Inpatient Rehabilitation Center:   Driving Evaluations  Home Health Rehabiltiation Services  Outpatient Rehabilitatation [facility]  Vocational Rehabilitation   Arrangements will be made to provide these services after discharge if needed.  Arrangements include referral to agencies that provide these services.  Your insurance has  been verified to be:  [employer] Your primary doctor is:  Dr. Andi Devon  Pertinent information will be shared with your doctor and your insurance company.  Social Worker:  [place], [place] [phone] or ([phone]  Information discussed with and copy given to patient by: Amada Jupiter, 04/22/2012, 3:43 PM

## 2012-04-22 NOTE — Progress Notes (Signed)
Occupational Therapy Session Note  Patient Details  Name: Matthew Juarez MRN: [id number] Date of Birth: 08-15-1960  Today's Date: 04/22/2012 Time: 1015-1100 Time Calculation (min): 45 min  Short Term Goals: Week 1:  OT Short Term Goal 1 (Week 1): Patient will donn pants at bed level with min assist using AE prn OT Short Term Goal 2 (Week 1): Patient will engage in bed mobility with min assist in order to donn shirt sitting edge of bed OT Short Term Goal 3 (Week 1): Toilet transfer <-> [facility] will be attempted with patient using AE prn OT Short Term Goal 4 (Week 1): Patient will complete grooming tasks seated in w/c at sink with set-up assist  Skilled Therapeutic Interventions/Progress Updates:    Pt engaged in bathing and dressing tasks seated EOB.  Focus on lateral leans to don pants and pull pants over hips.  Pt required assistance threading pants using reacher and also required assistance managing RLE to allow patient to pull pants over hips.  Pt verbalized understanding of technique but will continue to practice to enable pt to complete task independently.  Pt performed sliding board transfer bed->w/c with assist only to manage RLE during transfer.  Pt completed grooming tasks seated in w/c at sink.  Focus on sitting balance, lateral leans, transfers and managing RLE.  Therapy Documentation Precautions:  Precautions Precautions: Fall Required Braces or Orthoses: Other Brace/Splint Other Brace/Splint: hard splint -> RLE Restrictions Weight Bearing Restrictions: Yes RLE Weight Bearing: Non weight bearing LLE Weight Bearing: Weight bearing as tolerated General: General Amount of Missed OT Time (min): 15 Minutes Ortho Tech preparing to apply SLC Pain: Pain Assessment Pain Assessment: No/denies pain Pain Score: 0-No pain  See FIM for current functional status  Therapy/Group: Individual Therapy  Rich Brave 04/22/2012, 12:04 PM

## 2012-04-22 NOTE — Progress Notes (Deleted)
Inpatient Rehabilitation Center Individual Statement of Services  Patient Name:  Matthew Juarez  Date:  04/22/2012  Welcome to the [facility]patient [facility].  Our goal is to provide you with an individualized program based on your diagnosis and situation, designed to meet your specific needs.  With this comprehensive rehabilitation program, you will be expected to participate in at least 3 hours of rehabilitation therapies Monday-Friday, with modified therapy programming on the weekends.  Your rehabilitation program will include the following services:  Physical Therapy (PT), Occupational Therapy (OT), 24 hour per day rehabilitation nursing, Therapeutic Recreaction (TR), Neuropsychology, Case Management (Social Worker), Rehabilitation Medicine, Nutrition Services and Pharmacy Services  Weekly team conferences will be held on Tuesdays to discuss your progress.  Your  Social Worker will talk with you frequently to get your input and to update you on team discussions.  Team conferences with you and your family in attendance may also be held.  Expected length of stay: Tuesdays  Overall anticipated outcome: Modified independent to                                                                                                                           Moderate assistance (walking)  Depending on your progress and recovery, your program may change.  Your  Social Worker will coordinate services and will keep you informed of any changes.  Your  Social Worker's name and contact numbers are listed  below.  The following services may also be recommended but are not provided by the Inpatient Rehabilitation Center:   Driving Evaluations  Home Health Rehabiltiation Services  Outpatient Rehabilitatation [facility]  Vocational Rehabilitation   Arrangements will be made to provide these services after discharge if needed.  Arrangements include referral to agencies that provide these services.  Your  insurance has been verified to be:  [employer] Your primary doctor is:  Dr. Andi Devon  Pertinent information will be shared with your doctor and your insurance company.  Social Worker:  [place], [place] [phone] or ([phone]  Information discussed with and copy given to patient by: Amada Jupiter, 04/22/2012, 3:32 PM

## 2012-04-22 NOTE — Progress Notes (Signed)
Subjective/Complaints: Having a hard time getting comfortable at night in bed. Having tingling in legs which is contributing.  A 12 point review of systems has been performed and if not noted above is otherwise negative.   Objective: Vital Signs: Blood pressure 99/50, pulse 60, temperature 97.6 F (36.4 C), temperature source Oral, resp. rate 18, height 6\' 5"  (1.956 m), weight 133.6 kg (294 lb 8.6 oz), SpO2 96.00%. No results found.  Basename 04/21/12 0610  WBC 7.8  HGB 13.5  HCT 40.3  PLT 148*    Basename 04/21/12 0610  NA 140  K 4.3  CL 104  GLUCOSE 113*  BUN 29*  CREATININE 1.02  CALCIUM 9.1   CBG (last 3)  No results found for this basename: GLUCAP:3 in the last 72 hours  Wt Readings from Last 3 Encounters:  04/20/12 133.6 kg (294 lb 8.6 oz)  04/17/12 133.6 kg (294 lb 8.6 oz)  12/05/11 139.708 kg (308 lb)    Physical Exam:  Constitutional: He is oriented to person, place, and time. He appears well-developed.  HENT:  Head: Normocephalic.  Eyes:  Pupils round and reactive to light  Neck: Normal range of motion. Neck supple. No thyromegaly present.  Cardiovascular: Normal rate and regular rhythm. No murmurs  Pulmonary/Chest: Effort normal and breath sounds normal. No respiratory distress.  Abdominal: Soft. Bowel sounds are normal. He exhibits no distension. There is no tenderness.  Musculoskeletal:  Right ankle in splint  Neurological: He is alert and oriented to person, place, and time.  Follows full commands. T8 sensory level. Trace to absent sensation on the left below level, slightly diminished sense to PP/LT on right.(does have paresthesias) RLE remains 1 prox to tr-1 distally at the ankle. LLE is 3+ to 4/5 proximal to distal. UE's are 5/5. Cognitively intact. DTR's are 2+. Splint in place right lower extremity  Skin: Skin is warm and dry.  Psychiatric: He has a normal mood and affect. His behavior is normal. Judgment and thought content normal        Assessment/Plan: 1. Functional deficits secondary to cervico-thoracic transverse myelitis (clinically T8 level) with Brown-Sequard presentation which require 3+ hours per day of interdisciplinary therapy in a comprehensive inpatient rehab setting. Physiatrist is providing close team supervision and 24 hour management of active medical problems listed below. Physiatrist and rehab team continue to assess barriers to discharge/monitor patient progress toward functional and medical goals. FIM: FIM - Bathing Bathing: 0: Activity did not occur  FIM - Upper Body Dressing/Undressing Upper body dressing/undressing steps patient completed: Thread/unthread right sleeve of pullover shirt/dresss;Thread/unthread left sleeve of pullover shirt/dress;Put head through opening of pull over shirt/dress;Pull shirt over trunk Upper body dressing/undressing: 4: Steadying assist FIM - Lower Body Dressing/Undressing Lower body dressing/undressing: 1: Total-Patient completed less than 25% of tasks  FIM - Toileting Toileting: 0: Activity did not occur  FIM - Diplomatic Services operational officer Devices: Bedside commode;Sliding board (Drop Arm BSC) Toilet Transfers: 3-To toilet/BSC: Mod A (lift or lower assist);3-From toilet/BSC: Mod A (lift or lower assist)  FIM - Bed/Chair Transfer Bed/Chair Transfer Assistive Devices: Bed rails;Arm rests Bed/Chair Transfer: 4: Supine > Sit: Min A (steadying Pt. > 75%/lift 1 leg);3: Sit > Supine: Mod A (lifting assist/Pt. 50-74%/lift 2 legs);4: Bed > Chair or W/C: Min A (steadying Pt. > 75%);3: Chair or W/C > Bed: Mod A (lift or lower assist)  FIM - Locomotion: Wheelchair Distance: 10 Locomotion: Wheelchair: 1: Travels less than 50 ft with minimal assistance (Pt.>75%) FIM - Locomotion:  Ambulation Ambulation/Gait Assistance: Not tested (comment) Locomotion: Ambulation: 0: Activity did not occur  Comprehension Comprehension Mode: Auditory Comprehension: 7-Follows  complex conversation/direction: With no assist  Expression Expression Mode: Verbal Expression: 7-Expresses complex ideas: With no assist  Social Interaction Social Interaction: 7-Interacts appropriately with others - No medications needed.  Problem Solving Problem Solving: 7-Solves complex problems: Recognizes & self-corrects  Memory Memory: 7-Complete Independence: No helper  Medical Problem List and Plan:  1. cervico-thoracic transverse myelitis, T8 with Brown-Squard presentation. IV Solu-Medrol as indicated  2. Right distal fibular fracture. Patient is nonweightbearing. Await orthopedics to transition from splint to cast 3. DVT Prophylaxis/Anticoagulation: SCDs. lovenox 30 q12. Check dopplers  4. Pain Management: Hydrocodone as needed. Monitor with increased activity   -add neurontin for neuropathic pain 5. Neuropsych: This patient is capable of making decisions on his/her own behalf.  6. Hypertension. Norvasc 5 mg daily, Avapro 150 mg daily. Monitor with increased activity. Normotensive at present  7. History of gout. Allopurinol daily. Monitor for any flareups  8. Prerenal azotemia: push liquids  LOS (Days) 2 A FACE TO FACE EVALUATION WAS PERFORMED  SWARTZ,ZACHARY T 04/22/2012 7:21 AM

## 2012-04-22 NOTE — Plan of Care (Signed)
Problem: RH PAIN MANAGEMENT Goal: RH STG PAIN MANAGED AT OR BELOW PT'S PAIN GOAL 2/10  Outcome: Progressing No complaints of pain throughout day 1/22

## 2012-04-22 NOTE — Progress Notes (Addendum)
Physical Therapy Session Note  Patient Details  Name: Matthew Juarez MRN: [id number] Date of Birth: 1960/04/16  Today's Date: 04/22/2012 Time: 0925-0950 Time Calculation (min): 25 min  Skilled Therapeutic Interventions/Progress Updates:  Pt missed 20 min of skilled PT due to out for procedure - dopplers for RLE DVT. Pt with DVT, but cleared for no activity restrictions per MD.  Pt seated EOB upon arrival. Tx focused on bed mobility training and therex for strengthening.  Sit>supine with mod A assist and no rails, cues for sequence, assist for bil LE lifting. Pt has difficult time sequencing logroll technique. Instructed pt in rolling R/L so he can reposition in the night. Pt needing Min A for RLE management. Pt most comfortable on L side due to back pain. PT able to scoot up to [facility] with UE assist.  Supine therex including each of the following x15: glute sets, L ankle pumps, bil quad sets.  Assisted pt back up to EOB with Min A and LLE propped up for support, all needs in reach.      Therapy Documentation Precautions:  Precautions Precautions: Fall Required Braces or Orthoses: Other Brace/Splint Other Brace/Splint: hard splint -> RLE Restrictions Weight Bearing Restrictions: Yes RLE Weight Bearing: Non weight bearing LLE Weight Bearing: Weight bearing as tolerated Pain: No complaints       See FIM for current functional status  Therapy/Group: Individual Therapy  Clydene Laming, PT, DPT 04/22/2012, 9:50 AM

## 2012-04-22 NOTE — Progress Notes (Signed)
Orthopedic Tech Progress Note Patient Details:  Matthew Juarez 1961/03/20 [id number] Short leg cast applied to Right LE with assistance of nurse. Extra support added to heel. Application tolerated well.  Casting Type of Cast: Short leg cast Cast Location: Right LE Cast Material: Fiberglass Cast Intervention: Application     Asia R Thompson 04/22/2012, 2:25 PM

## 2012-04-22 NOTE — Progress Notes (Signed)
SLC today - repeat xrays Monday of ankle

## 2012-04-22 NOTE — Plan of Care (Signed)
Problem: SCI BLADDER ELIMINATION Goal: RH STG MANAGE BLADDER WITH ASSISTANCE STG Manage Bladder With mod I.  Outcome: Progressing Continent using urinal independently

## 2012-04-23 ENCOUNTER — Inpatient Hospital Stay (HOSPITAL_COMMUNITY): Payer: BC Managed Care – PPO | Admitting: Physical Therapy

## 2012-04-23 ENCOUNTER — Encounter (HOSPITAL_COMMUNITY): Payer: BC Managed Care – PPO

## 2012-04-23 ENCOUNTER — Inpatient Hospital Stay (HOSPITAL_COMMUNITY): Payer: BC Managed Care – PPO

## 2012-04-23 DIAGNOSIS — W19XXXA Unspecified fall, initial encounter: Secondary | ICD-10-CM

## 2012-04-23 DIAGNOSIS — S8263XA Displaced fracture of lateral malleolus of unspecified fibula, initial encounter for closed fracture: Secondary | ICD-10-CM

## 2012-04-23 DIAGNOSIS — G373 Acute transverse myelitis in demyelinating disease of central nervous system: Secondary | ICD-10-CM

## 2012-04-23 NOTE — Progress Notes (Signed)
Physical Therapy Session Note  Patient Details  Name: Matthew Juarez MRN: [id number] Date of Birth: [birth year]  Today's Date: 04/23/2012 Time: Session #1:  [id number], Session #2: [id number] Time Calculation (min): Session #1: 63 min, Session #2: 29 min   Short Term Goals: Week 1:  PT Short Term Goal 1 (Week 1): bed mobility supervision using assistive devices or compensatory techniques to move right leg PT Short Term Goal 2 (Week 1): slide board transfers to both left and right on level surfaces min assist both ways with pt independently placing board.   PT Short Term Goal 3 (Week 1): WC mobility >150' mod I for driving with assist only for parts management.   PT Short Term Goal 4 (Week 1): Sit to stand in parallel bars with two person assist (mod overall level) someone to stabilize chair someone to stabilize pt.   PT Short Term Goal 5 (Week 1): Stand for >5 mins with LRAD and NWB right foot for improved strength, endurance, left leg WB and activity tolerance.   Skilled Therapeutic Interventions/Progress Updates:    Session #1: This session focused on WC mobility >150' with supervision- cues for making sure he did not hit his right foot on obstacles.  Pt reported that his BP has been low this AM, so checked it since he has been sitting for a while upright in the WC: 77/54 in sitting.  RN made PA aware and plan to hold BP meds and try to get pt to intake more fluids.  WC to mat table transfer min assist to help with board placement and min assist to stabilize WC while pt scooted to his left.  Sit to supine min assist of right leg- attempted to utilize sheet to loop around his right leg so that he could control his leg, but knee flexed uncontrollably and pt unable to get to supine without min assist from therapist.  BP in supine 117/68.  Supine to sit, again trying to utilize sheet to move right leg unsuccessfully min assist to move leg over edge of mat table.  Sit to stand using the [employer] lift  working on [employer] on the left 3/4 squat to stand while trying to maintain NWB right leg.  Assist needed at left leg for controlled unlocking of the knee even in the [place] lift.  3 x 10 reps with seated rest break in between.  Assist needed to keep right leg NWB when descending to sit in the [place] lift.  I am not sure that gait will be a possibility until pt is able to bear weight through his right leg again, and even then it will involve very extensive therapy.   Session #2: This session focused on WC mobility to and from the room to the gym supervision >150'.  Sit to stand in parallel bars with pt's son, Dre helping to stabilize the WC so it won't tip over while we attempted standing (pt likes to stabilize his left leg on the [facility] and it has tipped in the past, so don't attempt without a second person).  Sit to stand mod assist with left leg blocked and trunk supported at the waist.  Pt able to muscle his way up onto extended arms and lock out left knee.  In standing worked on standing endurance (how long he could hold stand with all of his weight on his left leg and arms x 3 trials), and then controlling his descent x 3 sit to stands.  His tendency is  to unlock his left knee and come crashing down to sitting.  With therapist's manual assist at the left knee he was able to increase the control of his descent to sit.     Therapy Documentation Precautions:  Precautions Precautions: Fall Precaution Comments: loss of sensation or proprioception (LLE worse than RLE) with loss of strength R>L (trace to no strength in right leg all the way up to hip) Required Braces or Orthoses: Other Brace/Splint Other Brace/Splint: hard splint -> RLE Restrictions Weight Bearing Restrictions: Yes RLE Weight Bearing: Non weight bearing LLE Weight Bearing: Weight bearing as tolerated General:   Vital Signs: Therapy Vitals Pulse Rate: 74  BP: 77/54 mmHg Oxygen Therapy SpO2: 97 % O2 Device: None (Room air) Pain: Pain  Assessment Pain Assessment: No/denies pain Pain Score: 0-No pain Mobility:   Locomotion : Wheelchair Mobility Distance: 150   See FIM for current functional status  Therapy/Group: Individual Therapy  Lurena Joiner B. Breianna Delfino, PT, DPT [phone]   04/23/2012, 12:24 PM

## 2012-04-23 NOTE — Progress Notes (Signed)
Subjective/Complaints: Received cast. Likes it much better. Had a good day with therapies.  A 12 point review of systems has been performed and if not noted above is otherwise negative.   Objective: Vital Signs: Blood pressure 93/56, pulse 69, temperature 98 F (36.7 C), temperature source Oral, resp. rate 16, height 6\' 5"  (1.956 m), weight 133.6 kg (294 lb 8.6 oz), SpO2 98.00%. No results found.  Basename 04/21/12 0610  WBC 7.8  HGB 13.5  HCT 40.3  PLT 148*    Basename 04/21/12 0610  NA 140  K 4.3  CL 104  GLUCOSE 113*  BUN 29*  CREATININE 1.02  CALCIUM 9.1   CBG (last 3)  No results found for this basename: GLUCAP:3 in the last 72 hours  Wt Readings from Last 3 Encounters:  04/20/12 133.6 kg (294 lb 8.6 oz)  04/17/12 133.6 kg (294 lb 8.6 oz)  12/05/11 139.708 kg (308 lb)    Physical Exam:  Constitutional: He is oriented to person, place, and time. He appears well-developed.  HENT:  Head: Normocephalic.  Eyes:  Pupils round and reactive to light  Neck: Normal range of motion. Neck supple. No thyromegaly present.  Cardiovascular: Normal rate and regular rhythm. No murmurs  Pulmonary/Chest: Effort normal and breath sounds normal. No respiratory distress.  Abdominal: Soft. Bowel sounds are normal. He exhibits no distension. There is no tenderness.  Musculoskeletal:  Right ankle in splint  Neurological: He is alert and oriented to person, place, and time.  Follows full commands. T8 sensory level. Trace to absent sensation on the left below level, slightly diminished sense to PP/LT on right.(does have paresthesias) RLE remains 1 prox to tr-1 distally at the ankle. LLE is 3+ to 4/5 proximal to distal. UE's are 5/5. Cognitively intact. DTR's are 2+. Cast in place  Skin: Skin is warm and dry.  Psychiatric: He has a normal mood and affect. His behavior is normal. Judgment and thought content normal       Assessment/Plan: 1. Functional deficits secondary to  cervico-thoracic transverse myelitis (clinically T8 level) with Brown-Sequard presentation which require 3+ hours per day of interdisciplinary therapy in a comprehensive inpatient rehab setting. Physiatrist is providing close team supervision and 24 hour management of active medical problems listed below. Physiatrist and rehab team continue to assess barriers to discharge/monitor patient progress toward functional and medical goals.  Spoke to wife regarding medical and functional status FIM: FIM - Bathing Bathing Steps Patient Completed: Chest;Right Arm;Left Arm;Abdomen;Right upper leg;Left upper leg Bathing: 3: Mod-Patient completes 5-7 63f 10 parts or 50-74%  FIM - Upper Body Dressing/Undressing Upper body dressing/undressing steps patient completed: Thread/unthread right sleeve of pullover shirt/dresss;Thread/unthread left sleeve of pullover shirt/dress;Put head through opening of pull over shirt/dress;Pull shirt over trunk Upper body dressing/undressing: 5: Supervision: Safety issues/verbal cues FIM - Lower Body Dressing/Undressing Lower body dressing/undressing: 1: Total-Patient completed less than 25% of tasks  FIM - Toileting Toileting: 0: Activity did not occur  FIM - Diplomatic Services operational officer Devices: Bedside commode;Sliding board (Drop Arm BSC) Toilet Transfers: 3-To toilet/BSC: Mod A (lift or lower assist);3-From toilet/BSC: Mod A (lift or lower assist)  FIM - Bed/Chair Transfer Bed/Chair Transfer Assistive Devices: Arm rests;Sliding board Bed/Chair Transfer: 3: Supine > Sit: Mod A (lifting assist/Pt. 50-74%/lift 2 legs;4: Bed > Chair or W/C: Min A (steadying Pt. > 75%)  FIM - Locomotion: Wheelchair Distance: 150 Locomotion: Wheelchair: 5: Travels 150 ft or more: maneuvers on rugs and over door sills with supervision, cueing or  coaxing FIM - Locomotion: Ambulation Ambulation/Gait Assistance: Not tested (comment) Locomotion: Ambulation: 0: Activity did not  occur  Comprehension Comprehension Mode: Auditory Comprehension: 7-Follows complex conversation/direction: With no assist  Expression Expression Mode: Verbal Expression: 7-Expresses complex ideas: With no assist  Social Interaction Social Interaction: 7-Interacts appropriately with others - No medications needed.  Problem Solving Problem Solving: 7-Solves complex problems: Recognizes & self-corrects  Memory Memory: 7-Complete Independence: No helper  Medical Problem List and Plan:  1. cervico-thoracic transverse myelitis, T8 with Brown-Squard presentation. IV Solu-Medrol as indicated  2. Right distal fibular fracture. Patient is nonweightbearing. Await orthopedics to transition from splint to cast 3. DVT Prophylaxis/Anticoagulation: right popliteal DVT. Increased to mg/kg lovenox for 2wks then recheck dopplers. 4. Pain Management: Hydrocodone as needed. Monitor with increased activity   -added neurontin for neuropathic pain which has helped 5. Neuropsych: This patient is capable of making decisions on his/her own behalf.  6. Hypertension. Norvasc 5 mg daily, Avapro 150 mg daily. Monitor with increased activity. Normotensive at present  7. History of gout. Allopurinol daily. Monitor for any flareups  8. Prerenal azotemia: push liquids  LOS (Days) 3 A FACE TO FACE EVALUATION WAS PERFORMED  Juarez,Matthew T 04/23/2012 7:13 AM

## 2012-04-23 NOTE — Progress Notes (Signed)
Pt's blood pressure at 0820 was 80/40. Pt reported dizziness after sitting up with therapy. Harvel Ricks, PA made aware. Verbal order received to hold Norvasc 5mg  and Avapro 150mg . Medication held, per order. Pt encouraged to push fluids. At 10:20, physical therapist reported BP of 77/54. Harvel Ricks, PA made aware. No additional orders given at this time.

## 2012-04-23 NOTE — Progress Notes (Signed)
Occupational Therapy Note  Patient Details  Name: Matthew Juarez MRN: [id number] Date of Birth: 1960-11-03 Today's Date: 04/23/2012  Time: 1300-1345 Pt denies pain Individual therapy Pt rolled w/c to gym to practice sliding board transfers (level surface, lower surface and higher surface) with steady A to manage/position RLE.  Pt engaged in therex with weighted ball (3 kg) and PNF exercises with weighted ball (3 kg).  Pt practiced lateral scoots on mat with assistance only with managing RLE.  Pt also performed wheel chair pushups to increase independence with transfers and mobility.   Lavone Neri [facility] 04/23/2012, 2:39 PM

## 2012-04-23 NOTE — Progress Notes (Signed)
Occupational Therapy Session Note  Patient Details  Name: Matthew Juarez MRN: [id number] Date of Birth: Dec 20, 1960  Today's Date: 04/23/2012 Time: 0800-0858 Time Calculation (min): 58 min  Short Term Goals: Week 1:  OT Short Term Goal 1 (Week 1): Patient will donn pants at bed level with min assist using AE prn OT Short Term Goal 2 (Week 1): Patient will engage in bed mobility with min assist in order to donn shirt sitting edge of bed OT Short Term Goal 3 (Week 1): Toilet transfer <-> [facility] will be attempted with patient using AE prn OT Short Term Goal 4 (Week 1): Patient will complete grooming tasks seated in w/c at sink with set-up assist  Skilled Therapeutic Interventions/Progress Updates:    Pt in bed upon arrival.  Pt stated he thought he had a bowel movement but upon checking pt was clean.  Pt stated that he felt some gas but couldn't tell when he has a bowel movement. Pt required total assist for bathing buttocks.  Pt's BP was 80/40 per RN and patient reported slight light headedness after sitting EOB; pt reported no further symptoms and continued with bathing and dressing seated EOB.  Pt used reacher to thread LLE into pants leg and incorporated lateral leans to pull up pants.  Pt required assistance managing placement of RLE but was able to lean far enough to left to raise right buttocks off bed to pull pants over hips.  Pt performed sliding board transfer to w/c with assistance only for placing board and managing RLE during transfer.  Pt completed grooming tasks seated in w/c at sink.  Therapy Documentation Precautions:  Precautions Precautions: Fall Precaution Comments: loss of sensation or proprioception (LLE worse than RLE) with loss of strength R>L (trace to no strength in right leg all the way up to hip) Required Braces or Orthoses: Other Brace/Splint Other Brace/Splint: hard splint -> RLE Restrictions Weight Bearing Restrictions: Yes RLE Weight Bearing: Non weight bearing LLE  Weight Bearing: Weight bearing as tolerated Pain: Pain Assessment Pain Assessment: No/denies pain  See FIM for current functional status  Therapy/Group: Individual Therapy  Rich Brave 04/23/2012, 9:06 AM

## 2012-04-24 ENCOUNTER — Encounter (HOSPITAL_COMMUNITY): Payer: BC Managed Care – PPO | Admitting: Occupational Therapy

## 2012-04-24 ENCOUNTER — Inpatient Hospital Stay (HOSPITAL_COMMUNITY): Payer: BC Managed Care – PPO | Admitting: Occupational Therapy

## 2012-04-24 ENCOUNTER — Inpatient Hospital Stay (HOSPITAL_COMMUNITY): Payer: BC Managed Care – PPO | Admitting: Physical Therapy

## 2012-04-24 LAB — CBC
MCH: 28.7 pg (ref 26.0–34.0)
Platelets: 138 10*3/uL — ABNORMAL LOW (ref 150–400)
RBC: 4.56 MIL/uL (ref 4.22–5.81)

## 2012-04-24 NOTE — Progress Notes (Signed)
Physical Therapy Note  Patient Details  Name: Matthew Juarez MRN: [id number] Date of Birth: [birth year] Today's Date: 04/24/2012  Time In: 1000  Time Out: 1055  Individual Session.  Patient denies pain at initiation of session. Treatment focused on ADL retraining with emphasis on blood pressure management, sitting balance, activity tolerance, functional mobility transfer bed to wheelchair with sliding board, patient/family education, grooming wheelchair level at the sink, and use of adaptive equipment.  Patient motivated and participates well in therapy session.   Norton Pastel 04/24/2012, 11:32 AM

## 2012-04-24 NOTE — Progress Notes (Signed)
Subjective/Complaints: Sleeping better. Has questions about TM, prognosis, treatment  A 12 point review of systems has been performed and if not noted above is otherwise negative.   Objective: Vital Signs: Blood pressure 111/67, pulse 69, temperature 98.4 F (36.9 C), temperature source Oral, resp. rate 20, height 6\' 5"  (1.956 m), weight 133.6 kg (294 lb 8.6 oz), SpO2 98.00%. No results found. No results found for this basename: WBC:2,HGB:2,HCT:2,PLT:2 in the last 72 hours No results found for this basename: NA:2,K:2,CL:2,CO:2,GLUCOSE:2,BUN:2,CREATININE:2,CALCIUM:2 in the last 72 hours CBG (last 3)  No results found for this basename: GLUCAP:3 in the last 72 hours  Wt Readings from Last 3 Encounters:  04/20/12 133.6 kg (294 lb 8.6 oz)  04/17/12 133.6 kg (294 lb 8.6 oz)  12/05/11 139.708 kg (308 lb)    Physical Exam:  Constitutional: He is oriented to person, place, and time. He appears well-developed.  HENT:  Head: Normocephalic.  Eyes:  Pupils round and reactive to light  Neck: Normal range of motion. Neck supple. No thyromegaly present.  Cardiovascular: Normal rate and regular rhythm. No murmurs  Pulmonary/Chest: Effort normal and breath sounds normal. No respiratory distress.  Abdominal: Soft. Bowel sounds are normal. He exhibits no distension. There is no tenderness.  Musculoskeletal:  Right ankle in splint  Neurological: He is alert and oriented to person, place, and time.  Follows full commands. T8 sensory level. Trace to absent sensation on the left below level, slightly diminished sense to PP/LT on right.(does have paresthesias) RLE remains 1 prox to tr-1 distally at the ankle. LLE is 3+ to 4/5 proximal to distal. UE's are 5/5. Cognitively intact. DTR's are 2+. Cast in place  Skin: Skin is warm and dry.  Psychiatric: He has a normal mood and affect. His behavior is normal. Judgment and thought content normal       Assessment/Plan: 1. Functional deficits secondary to  cervico-thoracic transverse myelitis (clinically T8 level) with Brown-Sequard presentation which require 3+ hours per day of interdisciplinary therapy in a comprehensive inpatient rehab setting. Physiatrist is providing close team supervision and 24 hour management of active medical problems listed below. Physiatrist and rehab team continue to assess barriers to discharge/monitor patient progress toward functional and medical goals.  Spoke to pt regarding treatment and prognosis FIM: FIM - Bathing Bathing Steps Patient Completed: Chest;Right Arm;Left Arm;Abdomen;Right upper leg;Left upper leg Bathing: 3: Mod-Patient completes 5-7 29f 10 parts or 50-74%  FIM - Upper Body Dressing/Undressing Upper body dressing/undressing steps patient completed: Thread/unthread right sleeve of pullover shirt/dresss;Thread/unthread left sleeve of pullover shirt/dress;Put head through opening of pull over shirt/dress;Pull shirt over trunk Upper body dressing/undressing: 5: Supervision: Safety issues/verbal cues FIM - Lower Body Dressing/Undressing Lower body dressing/undressing steps patient completed: Don/Doff left sock;Don/Doff left shoe;Thread/unthread left pants leg;Pull pants up/down Lower body dressing/undressing: 2: Max-Patient completed 25-49% of tasks  FIM - Toileting Toileting: 0: Activity did not occur  FIM - Diplomatic Services operational officer Devices: Bedside commode;Sliding board (Drop Arm BSC) Toilet Transfers: 3-To toilet/BSC: Mod A (lift or lower assist);3-From toilet/BSC: Mod A (lift or lower assist)  FIM - Banker Devices: Sliding board Bed/Chair Transfer: 4: Supine > Sit: Min A (steadying Pt. > 75%/lift 1 leg);4: Sit > Supine: Min A (steadying pt. > 75%/lift 1 leg);4: Bed > Chair or W/C: Min A (steadying Pt. > 75%);4: Chair or W/C > Bed: Min A (steadying Pt. > 75%)  FIM - Locomotion: Wheelchair Distance: 150 Locomotion: Wheelchair: 5:  Travels 150 ft or more:  maneuvers on rugs and over door sills with supervision, cueing or coaxing FIM - Locomotion: Ambulation Ambulation/Gait Assistance: Not tested (comment) Locomotion: Ambulation: 0: Activity did not occur  Comprehension Comprehension Mode: Auditory Comprehension: 7-Follows complex conversation/direction: With no assist  Expression Expression Mode: Verbal Expression: 7-Expresses complex ideas: With no assist  Social Interaction Social Interaction: 7-Interacts appropriately with others - No medications needed.  Problem Solving Problem Solving: 7-Solves complex problems: Recognizes & self-corrects  Memory Memory: 7-Complete Independence: No helper  Medical Problem List and Plan:  1. cervico-thoracic transverse myelitis, T8 with Brown-Squard presentation. IV Solu-Medrol as indicated  2. Right distal fibular fracture. Patient is nonweightbearing. Await orthopedics to transition from splint to cast 3. DVT Prophylaxis/Anticoagulation: right popliteal DVT. Increased to mg/kg lovenox for 2wks then recheck dopplers. 4. Pain Management: Hydrocodone as needed. Monitor with increased activity   -added neurontin for neuropathic pain which has helped. He is sleeping better as a result 5. Neuropsych: This patient is capable of making decisions on his/her own behalf.  6. Hypertension. Norvasc 5 mg daily, Avapro 150 mg daily. Monitor with increased activity. Normotensive at present  7. History of gout. Allopurinol daily. Monitor for any flareups  8. Prerenal azotemia: push liquids  LOS (Days) 4 A FACE TO FACE EVALUATION WAS PERFORMED  SWARTZ,ZACHARY T 04/24/2012 7:18 AM

## 2012-04-24 NOTE — Progress Notes (Signed)
Physical Therapy Note  Patient Details  Name: Matthew Juarez MRN: [id number] Date of Birth: 1960-12-08 Today's Date: 04/24/2012  1110-1200 (50 minutes) individual Pain: no complaint of pain Focus of treatment: transfer training; wc mobility training/endurance; Neuro re-ed bilateral LEs Treatment: wc mobility - SBA unit; transfers - pt able to place wc for transfer to mat with assist for RT footrest; transfer with sliding board SBA (level) wc >< mat; sit to supine min assist with leg loop RT LE; supine to sit SBA (mat) with leg loop RT LE; Neuro re-ed - AA hip flexion on right in sidelying (gravity eliminated); LT LE- X 20 hip flexion/extension in supine, hip abduction, quad sets, SAQS.   1430-1500 (30 minutes) individual Pain: no complaint of pain Focus of treatment: transfer training; Neuro re-ed RT LE Treatment: Transfers with assist for placement of sliding board/ SBA transfer slight incline; RT LE Neuro re-ed using suspension grid (antigravity) for AA hip flexion (sidelying) , hip abduction in supine with minimal active initiation.  [name],JIM 04/24/2012, 7:41 AM

## 2012-04-24 NOTE — Progress Notes (Signed)
Occupational Therapy Note  Patient Details  Name: Matthew Juarez MRN: [id number] Date of Birth: [birth year] Today's Date: 04/24/2012  Time In 1345  Time Out 1420 Individual Session.  Patient denies pain.   Treatment session focused on skin integrity and pressure relief positioning, BUE strengthening for functional mobility transfer training, wheelchair to/from transfer tub bench via squat pivot with minimum assistance to manage RLE, sitting balance, and discharge planning with DME.   Norton Pastel 04/24/2012, 2:57 PM

## 2012-04-25 ENCOUNTER — Inpatient Hospital Stay (HOSPITAL_COMMUNITY): Payer: BC Managed Care – PPO

## 2012-04-25 LAB — URINALYSIS, ROUTINE W REFLEX MICROSCOPIC
Glucose, UA: NEGATIVE mg/dL
Hgb urine dipstick: NEGATIVE
Protein, ur: NEGATIVE mg/dL
Specific Gravity, Urine: 1.027 (ref 1.005–1.030)

## 2012-04-25 MED ORDER — GABAPENTIN 300 MG PO CAPS
300.0000 mg | ORAL_CAPSULE | Freq: Three times a day (TID) | ORAL | Status: DC
  Administered 2012-04-25 – 2012-05-11 (×47): 300 mg via ORAL
  Filled 2012-04-25 (×55): qty 1

## 2012-04-25 MED ORDER — BISACODYL 10 MG RE SUPP
10.0000 mg | Freq: Every day | RECTAL | Status: DC | PRN
Start: 2012-04-25 — End: 2012-04-28
  Administered 2012-04-25: 10 mg via RECTAL
  Filled 2012-04-25: qty 1

## 2012-04-25 MED ORDER — FLEET ENEMA 7-19 GM/118ML RE ENEM: 1.0000 | ENEMA | Freq: Once | RECTAL | Status: DC

## 2012-04-25 NOTE — Progress Notes (Signed)
Occupational Therapy Note  Patient Details  Name: Matthew Juarez MRN: [id number] Date of Birth: [birth year] Today's Date: 04/25/2012  Time: 1000-1055 Pt denies pain Individual Therapy  Pt rolled in w/c to gym to engage in BUE therex with UE bike (8 min at level 4 in on direction and then 8 min at level 4 in opposite direction.) Pt practiced sliding board transfers w/c<>mat X 3 with assistance only for RLE management.  Pt practiced bed mobility sit<>supine on mat and lateral scoots with focus on RLE management.   Lavone Neri [facility] 04/25/2012, 12:02 PM

## 2012-04-25 NOTE — Progress Notes (Signed)
Subjective/Complaints: Feet and legs are bothering him. A lot of tingling/burning.  A 12 point review of systems has been performed and if not noted above is otherwise negative.   Objective: Vital Signs: Blood pressure 102/57, pulse 67, temperature 98.4 F (36.9 C), temperature source Oral, resp. rate 20, height 6\' 5"  (1.956 m), weight 133.6 kg (294 lb 8.6 oz), SpO2 98.00%. No results found.  Basename 04/24/12 0723  WBC 7.0  HGB 13.1  HCT 39.0  PLT 138*   No results found for this basename: NA:2,K:2,CL:2,CO:2,GLUCOSE:2,BUN:2,CREATININE:2,CALCIUM:2 in the last 72 hours CBG (last 3)  No results found for this basename: GLUCAP:3 in the last 72 hours  Wt Readings from Last 3 Encounters:  04/20/12 133.6 kg (294 lb 8.6 oz)  04/17/12 133.6 kg (294 lb 8.6 oz)  12/05/11 139.708 kg (308 lb)    Physical Exam:  Constitutional: He is oriented to person, place, and time. He appears well-developed.  HENT:  Head: Normocephalic.  Eyes:  Pupils round and reactive to light  Neck: Normal range of motion. Neck supple. No thyromegaly present.  Cardiovascular: Normal rate and regular rhythm. No murmurs  Pulmonary/Chest: Effort normal and breath sounds normal. No respiratory distress.  Abdominal: Soft. Bowel sounds are normal. He exhibits no distension. There is no tenderness.  Musculoskeletal:  Right ankle in splint  Neurological: He is alert and oriented to person, place, and time.  Follows full commands. T8 sensory level. Trace to absent sensation on the left below level, slightly diminished sense to PP/LT on right.(does have paresthesias) RLE remains 1 prox to tr-1 distally at the ankle. LLE is 3+ to 4/5 proximal to distal. UE's are 5/5. Cognitively intact. DTR's are 2+. Cast in place  Skin: Skin is warm and dry.  Psychiatric: He has a normal mood and affect. His behavior is normal. Judgment and thought content normal       Assessment/Plan: 1. Functional deficits secondary to  cervico-thoracic transverse myelitis (clinically T8 level) with Brown-Sequard presentation which require 3+ hours per day of interdisciplinary therapy in a comprehensive inpatient rehab setting. Physiatrist is providing close team supervision and 24 hour management of active medical problems listed below. Physiatrist and rehab team continue to assess barriers to discharge/monitor patient progress toward functional and medical goals.  Spoke to pt regarding treatment and prognosis. Wife also present today. Wife states that she had never heard the diagnosis "transverse myelitis" until he was transferred here to rehab. They are requesting a second opinion regarding his diagnosis and treatment. I will speak with neurology in this regard in the AM. They are requesting a transfer to [facility].    FIM: FIM - Bathing Bathing Steps Patient Completed: Chest;Right Arm;Left Arm;Abdomen;Front perineal area;Buttocks;Right upper leg;Left upper leg (Patient with hard cast on RLE) Bathing: 4: Min-Patient completes 8-9 65f 10 parts or 75+ percent  FIM - Upper Body Dressing/Undressing Upper body dressing/undressing steps patient completed: Thread/unthread right sleeve of pullover shirt/dresss;Thread/unthread left sleeve of pullover shirt/dress;Put head through opening of pull over shirt/dress;Pull shirt over trunk Upper body dressing/undressing: 5: Set-up assist to: Obtain clothing/put away FIM - Lower Body Dressing/Undressing Lower body dressing/undressing steps patient completed: Thread/unthread left pants leg;Pull pants up/down;Don/Doff left shoe;Fasten/unfasten left shoe (TED hose; brief) Lower body dressing/undressing: 1: Total-Patient completed less than 25% of tasks  FIM - Toileting Toileting steps completed by patient: Adjust clothing prior to toileting;Performs perineal hygiene;Adjust clothing after toileting Toileting: 0: Activity did not occur  FIM - Diplomatic Services operational officer Devices:  Bedside commode;Sliding board (  Drop Arm BSC) Toilet Transfers: 0-Activity did not occur  FIM - Banker Devices: Sliding board Bed/Chair Transfer: 4: Bed > Chair or W/C: Min A (steadying Pt. > 75%)  FIM - Locomotion: Wheelchair Distance: 150 Locomotion: Wheelchair: 0: Activity did not occur FIM - Locomotion: Ambulation Ambulation/Gait Assistance: Not tested (comment) Locomotion: Ambulation: 0: Activity did not occur  Comprehension Comprehension Mode: Auditory Comprehension: 7-Follows complex conversation/direction: With no assist  Expression Expression Mode: Verbal Expression: 7-Expresses complex ideas: With no assist  Social Interaction Social Interaction: 6-Interacts appropriately with others with medication or extra time (anti-anxiety, antidepressant).  Problem Solving Problem Solving: 7-Solves complex problems: Recognizes & self-corrects  Memory Memory: 7-Complete Independence: No helper  Medical Problem List and Plan:  1. cervico-thoracic transverse myelitis, T8 with Brown-Squard clinical presentation. See notes above. 2. Right distal fibular fracture. Patient is nonweightbearing. Await orthopedics to transition from splint to cast 3. DVT Prophylaxis/Anticoagulation: right popliteal DVT. Increased to mg/kg lovenox for 2wks then recheck dopplers. 4. Pain Management: Hydrocodone as needed. Monitor with increased activity   -increase neurontin for neuropathic pain  5. Neuropsych: This patient is capable of making decisions on his/her own behalf.  6. Hypertension. Norvasc 5 mg daily, Avapro 150 mg daily. Monitor with increased activity. Normotensive at present  7. History of gout. Allopurinol daily. Monitor for any flareups  8. Prerenal azotemia: push liquids  LOS (Days) 5 A FACE TO FACE EVALUATION WAS PERFORMED  SWARTZ,ZACHARY T 04/25/2012 7:03 AM

## 2012-04-26 ENCOUNTER — Inpatient Hospital Stay (HOSPITAL_COMMUNITY): Payer: BC Managed Care – PPO | Admitting: Physical Therapy

## 2012-04-26 ENCOUNTER — Inpatient Hospital Stay (HOSPITAL_COMMUNITY): Payer: BC Managed Care – PPO

## 2012-04-26 ENCOUNTER — Encounter (HOSPITAL_COMMUNITY): Payer: BC Managed Care – PPO

## 2012-04-26 DIAGNOSIS — G373 Acute transverse myelitis in demyelinating disease of central nervous system: Secondary | ICD-10-CM

## 2012-04-26 DIAGNOSIS — W19XXXA Unspecified fall, initial encounter: Secondary | ICD-10-CM

## 2012-04-26 DIAGNOSIS — S8263XA Displaced fracture of lateral malleolus of unspecified fibula, initial encounter for closed fracture: Secondary | ICD-10-CM

## 2012-04-26 DIAGNOSIS — I803 Phlebitis and thrombophlebitis of lower extremities, unspecified: Secondary | ICD-10-CM

## 2012-04-26 DIAGNOSIS — I82409 Acute embolism and thrombosis of unspecified deep veins of unspecified lower extremity: Secondary | ICD-10-CM

## 2012-04-26 LAB — URINE CULTURE: Colony Count: 7000

## 2012-04-26 NOTE — Plan of Care (Signed)
Problem: RH SAFETY Goal: RH STG ADHERE TO SAFETY PRECAUTIONS W/ASSISTANCE/DEVICE STG Adhere to Safety Precautions With Assistance/Device. Supervision Outcome: Progressing Calls appropriately for assistance     

## 2012-04-26 NOTE — Progress Notes (Signed)
Physical Therapy Note  Patient Details  Name: JOVI ZAVADIL MRN: [id number] Date of Birth: 03/24/1961 Today's Date: 04/26/2012  1000-1055 (55 minutes) individual Pain: no complaint of pain Focus of treatment: transfer training; Neuro re-ed bilateral LEs ; sit to stand /standing tolerance (NWB RT LE) Treatment: wc mobility - modified independent unit >150 feet in controlled environment; transfer scoot to right or left (level) SBA with assist for legrests and removal of armrest; Neuro re-ed RT LE using suspension grid (gravity eliminated) AA hip flexion/extension; gravity eliminated hip abduction/adduction; AA hip ab/adduction in hooklying; AA hip flexion in supine using therapy ball; sit to stand in parallel bars (pulling up on bars) mod assist blocking left knee /NWB RT LE X 2.   1500-1540 (40 minutes) individual Pain: no reported pain Focus of treatment: Transfer training as below Treatment: Pt instructed in and redemonstrated with setup for sliding board wc >< car transfer with seated in maximum recline X 1 SBA for safety; wc >< raised mat for unlevel squat /pivot transfer to left with SBA + vcs not to push out on wc but down during transfer to prevent wc from moving. Pt needed min assist RT LE during sit >< supine. (mat).   Tranquilino Fischler,JIM 04/26/2012, 7:28 AM

## 2012-04-26 NOTE — Progress Notes (Signed)
04/26/12   0600..Called to room by wife, who stated she thought pt bleeding from rectum. A large amt of blood was cleansed from patients scrotum and inner thighs, clots present, also.   Blood was actively coming from large tear to right inner thigh/groin area.   Surrounding area appears to have blister-like areas.  Will have MD assess, for now padded till seen.  Royden Purl, RN

## 2012-04-26 NOTE — Progress Notes (Signed)
Subjective/Complaints: Feet and legs are feeling better. Bloody drainage from groin.  A 12 point review of systems has been performed and if not noted above is otherwise negative.   Objective: Vital Signs: Blood pressure 103/60, pulse 75, temperature 98.6 F (37 C), temperature source Oral, resp. rate 20, height 6\' 5"  (1.956 m), weight 133.6 kg (294 lb 8.6 oz), SpO2 98.00%. No results found.  Basename 04/24/12 0723  WBC 7.0  HGB 13.1  HCT 39.0  PLT 138*   No results found for this basename: NA:2,K:2,CL:2,CO:2,GLUCOSE:2,BUN:2,CREATININE:2,CALCIUM:2 in the last 72 hours CBG (last 3)  No results found for this basename: GLUCAP:3 in the last 72 hours  Wt Readings from Last 3 Encounters:  04/20/12 133.6 kg (294 lb 8.6 oz)  04/17/12 133.6 kg (294 lb 8.6 oz)  12/05/11 139.708 kg (308 lb)    Physical Exam:  Constitutional: He is oriented to person, place, and time. He appears well-developed.  HENT:  Head: Normocephalic.  Eyes:  Pupils round and reactive to light  Neck: Normal range of motion. Neck supple. No thyromegaly present.  Cardiovascular: Normal rate and regular rhythm. No murmurs  Pulmonary/Chest: Effort normal and breath sounds normal. No respiratory distress.  Abdominal: Soft. Bowel sounds are normal. He exhibits no distension. There is no tenderness.  Musculoskeletal:  Right ankle in splint  Neurological: He is alert and oriented to person, place, and time.  Follows full commands. T8 sensory level. Trace to absent sensation on the left below level, slightly diminished sense to PP/LT on right.(does have paresthesias) RLE remains 1 prox to tr-1 distally at the ankle. LLE is 3+ to 4/5 proximal to distal. UE's are 5/5. Cognitively intact. DTR's are 2+. Cast in place  Skin: Small area of breakdown on right inner thigh, approximately 2cm Psychiatric: He has a normal mood and affect. His behavior is normal. Judgment and thought content normal       Assessment/Plan: 1.  Functional deficits secondary to cervico-thoracic transverse myelitis (clinically T8 level) with Brown-Sequard presentation which require 3+ hours per day of interdisciplinary therapy in a comprehensive inpatient rehab setting. Physiatrist is providing close team supervision and 24 hour management of active medical problems listed below. Physiatrist and rehab team continue to assess barriers to discharge/monitor patient progress toward functional and medical goals.  Spoke to pt regarding treatment and prognosis. Wife also present again today. Wife states that she had never heard the diagnosis "transverse myelitis" until he was transferred here to rehab. They are requesting a second opinion regarding his diagnosis and treatment. I will contact neurology as I would like for them to speak to pt/wife first. Wife request referral to [facility] or [facility].    FIM: FIM - Bathing Bathing Steps Patient Completed: Chest;Right Arm;Left Arm;Abdomen;Front perineal area;Buttocks;Right upper leg;Left upper leg (Patient with hard cast on RLE) Bathing: 4: Min-Patient completes 8-9 54f 10 parts or 75+ percent  FIM - Upper Body Dressing/Undressing Upper body dressing/undressing steps patient completed: Thread/unthread right sleeve of pullover shirt/dresss;Thread/unthread left sleeve of pullover shirt/dress;Put head through opening of pull over shirt/dress;Pull shirt over trunk Upper body dressing/undressing: 5: Set-up assist to: Obtain clothing/put away FIM - Lower Body Dressing/Undressing Lower body dressing/undressing steps patient completed: Thread/unthread left pants leg;Pull pants up/down;Don/Doff left shoe;Fasten/unfasten left shoe (TED hose; brief) Lower body dressing/undressing: 1: Total-Patient completed less than 25% of tasks  FIM - Toileting Toileting steps completed by patient: Adjust clothing prior to toileting;Performs perineal hygiene;Adjust clothing after toileting Toileting: 0: Activity did not  occur  FIM -  Diplomatic Services operational officer Devices: Bedside commode;Sliding board (Drop Arm BSC) Toilet Transfers: 0-Activity did not occur  FIM - Architectural technologist Transfer: 4: Bed > Chair or W/C: Min A (steadying Pt. > 75%)  FIM - Locomotion: Wheelchair Distance: 150 Locomotion: Wheelchair: 0: Activity did not occur FIM - Locomotion: Ambulation Ambulation/Gait Assistance: Not tested (comment) Locomotion: Ambulation: 0: Activity did not occur  Comprehension Comprehension Mode: Auditory Comprehension: 7-Follows complex conversation/direction: With no assist  Expression Expression Mode: Verbal Expression: 7-Expresses complex ideas: With no assist  Social Interaction Social Interaction: 6-Interacts appropriately with others with medication or extra time (anti-anxiety, antidepressant).  Problem Solving Problem Solving: 7-Solves complex problems: Recognizes & self-corrects  Memory Memory: 7-Complete Independence: No helper  Medical Problem List and Plan:  1. cervico-thoracic transverse myelitis, T8 with Brown-Squard clinical presentation. See notes above. 2. Right distal fibular fracture. Patient is nonweightbearing. In cast. xr this am 3. DVT Prophylaxis/Anticoagulation: right popliteal DVT. Increased to mg/kg lovenox for 2wks then recheck dopplers. 4. Pain Management: Hydrocodone as needed. Monitor with increased activity   -increase neurontin to 300 tid which has helped dysesthesias 5. Neuropsych: This patient is capable of making decisions on his/her own behalf.  6. Hypertension. Norvasc 5 mg daily, Avapro 150 mg daily. Monitor with increased activity. Normotensive at present  7. History of gout. Allopurinol daily. Monitor for any flareups  8. Prerenal azotemia: push liquids 9. Would place an allevyn patch over thigh wound later this morning. Bleeding more because of lovenox. Pt didn't notice  until bleeding because of his sensory loss.  LOS (Days) 6 A FACE TO FACE EVALUATION WAS PERFORMED  Matthew Juarez T 04/26/2012 7:06 AM

## 2012-04-26 NOTE — Plan of Care (Signed)
Problem: RH PAIN MANAGEMENT Goal: RH STG PAIN MANAGED AT OR BELOW PT'S PAIN GOAL 2/10  Outcome: Progressing No c/o pain     

## 2012-04-26 NOTE — Progress Notes (Signed)
Occupational Therapy Session Note  Patient Details  Name: Matthew Juarez MRN: [id number] Date of Birth: [birth year]  Today's Date: 04/26/2012  Session 1 Time: 0800-0855 Time Calculation (min): 55 min  Short Term Goals: Week 1:  OT Short Term Goal 1 (Week 1): Patient will donn pants at bed level with min assist using AE prn OT Short Term Goal 2 (Week 1): Patient will engage in bed mobility with min assist in order to donn shirt sitting edge of bed OT Short Term Goal 3 (Week 1): Toilet transfer <-> [facility] will be attempted with patient using AE prn OT Short Term Goal 4 (Week 1): Patient will complete grooming tasks seated in w/c at sink with set-up assist  Skilled Therapeutic Interventions/Progress Updates:    Pt in bed upon arrival.  Pt wearing hospital gown and no undergarments since nursing had earlier performed a skin assessment and placed a dressing in groin area.  Pt rolled side to side using bed rails with assistance to manage RLE.  Pt performed supine to sit with assistance only for RLE positioning and moving off edge of bed.  Pt exhibits only trace movement in RLE.  Pt completed bathing and dressing tasks seated EOB before transferring to w/c with sliding board.  Pt able to don shoe on left foot and tie shoe laces.  Pt required assistance only for positioning of board and RLE.  Pt completed grooming tasks seated in w/c at sink.  Focus on bed mobility, transfers, dynamic sitting balance, and activity tolerance.  Therapy Documentation Precautions:  Precautions Precautions: Fall Precaution Comments: loss of sensation or proprioception (LLE worse than RLE) with loss of strength R>L (trace to no strength in right leg all the way up to hip) Required Braces or Orthoses: Other Brace/Splint Other Brace/Splint: hard splint -> RLE Restrictions Weight Bearing Restrictions: Yes RLE Weight Bearing: Non weight bearing LLE Weight Bearing: Weight bearing as tolerated   Pain: Pain Assessment Pain  Assessment: No/denies pain  See FIM for current functional status  Therapy/Group: Individual Therapy  Session 2 Time: 1104-1200 Pt denies pain Pt rolled from room to tub room to practice tub bench transfers.  Pt required only min A to perform transfer after board placement but unable to get RLE into tub secondary to decreased flexibility and long legs.  Pt's wife present for session.  Discussed home arrangements and recommendations.  Pt transitioned to kitchen area to practice w/c mobility for simple kitchen tasks in a confined area.  Pt exhibited appropriate safety and problem solving skills.  Pt completed session with BUE therex on UE ergonometer before rolling back to room. Lavone Neri [facility] 04/26/2012, 10:01 AM

## 2012-04-26 NOTE — Progress Notes (Signed)
52 yo M with transverse myelitis. Became symptomatic on 01/12, progressed over two days. Fell and broke right foot. Olig was negatiev, ANA weakly positive, ACE negative, ESR 10, lyme negative. I do not see aquaporin-4 abs.   Exam: Filed Vitals:   04/26/12 1613  BP: 106/70  Pulse: 83  Temp: 98.3 F (36.8 C)  Resp: 18   Gen: In bed, NAD MS: Awake, Alert, NAD WU:[id number], EOMI, face symmetric to movement and sensation, tongue midline.  Motor: 4/5 in left leg 1-2/5 throughout right leg proximally, though distal muscles unable to be tested. Arms 5/5 throughout.  Sensory:Marked decerase to all modalities in left leg, has some sensation on right.   Impression: 52 yo M with longitudinally extensive transverse myelitis. This is most often associated with neuromyelitis optica even without a history of optic neuritis. His ANA was only weakly positive. Though pheresis can be helpful in some cases, and it would be reasonable to try this my suspicion is that it will not have much benefit. That being said, with his continued weakness, I do think it reasonable to perform this.   Recommendations: 1) send auquaporin-4 antibodies(NMO) 2) Plasmapheresis x 5 treatments qod, will likely not be able to arrange until tomorrow.  3) continue rehab.   Ritta Slot, MD [facility] [phone]  If 7pm- 7am, please page neurology on call at [phone].

## 2012-04-27 ENCOUNTER — Inpatient Hospital Stay (HOSPITAL_COMMUNITY): Payer: BC Managed Care – PPO

## 2012-04-27 ENCOUNTER — Inpatient Hospital Stay (HOSPITAL_COMMUNITY): Payer: BC Managed Care – PPO | Admitting: Physical Therapy

## 2012-04-27 LAB — CBC
HCT: 35.6 % — ABNORMAL LOW (ref 39.0–52.0)
MCH: 29.1 pg (ref 26.0–34.0)
MCV: 85.6 fL (ref 78.0–100.0)
Platelets: 164 10*3/uL (ref 150–400)
RBC: 4.16 MIL/uL — ABNORMAL LOW (ref 4.22–5.81)
WBC: 7 10*3/uL (ref 4.0–10.5)

## 2012-04-27 LAB — APTT: aPTT: 34 seconds (ref 24–37)

## 2012-04-27 LAB — PROTIME-INR: INR: 0.93 (ref 0.00–1.49)

## 2012-04-27 MED ORDER — FENTANYL CITRATE 0.05 MG/ML IJ SOLN
INTRAMUSCULAR | Status: AC | PRN
  Administered 2012-04-27: 50 ug via INTRAVENOUS

## 2012-04-27 MED ORDER — SODIUM CHLORIDE 0.9 % IV SOLN
4.0000 g | INTRAVENOUS | Status: DC | PRN
  Filled 2012-04-27: qty 40

## 2012-04-27 MED ORDER — CALCIUM CARBONATE ANTACID 500 MG PO CHEW
2.0000 | CHEWABLE_TABLET | ORAL | Status: DC | PRN
  Filled 2012-04-27: qty 2

## 2012-04-27 MED ORDER — ACETAMINOPHEN 325 MG PO TABS: 650.0000 mg | ORAL_TABLET | ORAL | Status: DC | PRN

## 2012-04-27 MED ORDER — MIDAZOLAM HCL 2 MG/2ML IJ SOLN
INTRAMUSCULAR | Status: AC | PRN
  Administered 2012-04-27: 1 mg via INTRAVENOUS

## 2012-04-27 MED ORDER — SODIUM CHLORIDE 0.9 % IV SOLN: INTRAVENOUS | Status: AC

## 2012-04-27 MED ORDER — SODIUM CHLORIDE 0.9 % IV SOLN
INTRAVENOUS | Status: AC
  Administered 2012-04-28 (×5): via INTRAVENOUS_CENTRAL
  Filled 2012-04-27 (×5): qty 200

## 2012-04-27 MED ORDER — CALCIUM GLUCONATE 10 % IV SOLN
2.0000 g | INTRAVENOUS | Status: DC | PRN
  Filled 2012-04-27: qty 20

## 2012-04-27 MED ORDER — ENOXAPARIN SODIUM 150 MG/ML ~~LOC~~ SOLN
1.0000 mg/kg | Freq: Two times a day (BID) | SUBCUTANEOUS | Status: DC
  Administered 2012-04-27 – 2012-05-02 (×11): 135 mg via SUBCUTANEOUS
  Filled 2012-04-27 (×12): qty 1

## 2012-04-27 MED ORDER — CEFAZOLIN SODIUM-DEXTROSE 2-3 GM-% IV SOLR
2.0000 g | INTRAVENOUS | Status: AC
  Administered 2012-04-27: 2 g via INTRAVENOUS
  Filled 2012-04-27: qty 50

## 2012-04-27 MED ORDER — ACD FORMULA A 0.73-2.45-2.2 GM/100ML VI SOLN
500.0000 mL | Status: DC
  Filled 2012-04-27: qty 500

## 2012-04-27 MED ORDER — GADOBENATE DIMEGLUMINE 529 MG/ML IV SOLN
20.0000 mL | Freq: Once | INTRAVENOUS | Status: AC
  Administered 2012-04-27: 20 mL via INTRAVENOUS

## 2012-04-27 MED ORDER — HEPARIN SODIUM (PORCINE) 1000 UNIT/ML IJ SOLN
1000.0000 [IU] | Freq: Once | INTRAMUSCULAR | Status: DC
  Filled 2012-04-27: qty 1

## 2012-04-27 MED ORDER — DIPHENHYDRAMINE HCL 25 MG PO CAPS: 25.0000 mg | ORAL_CAPSULE | Freq: Four times a day (QID) | ORAL | Status: DC | PRN

## 2012-04-27 MED ORDER — CALCIUM CARBONATE ANTACID 500 MG PO CHEW
2.0000 | CHEWABLE_TABLET | ORAL | Status: AC
  Filled 2012-04-27 (×2): qty 2

## 2012-04-27 MED ORDER — SODIUM CHLORIDE 0.9 % IV SOLN
2.0000 g | INTRAVENOUS | Status: DC | PRN
  Administered 2012-04-28: 2 g via INTRAVENOUS
  Filled 2012-04-27 (×2): qty 20

## 2012-04-27 MED ORDER — SODIUM CHLORIDE 0.9 % IV SOLN
Freq: Once | INTRAVENOUS | Status: DC
  Filled 2012-04-27: qty 200

## 2012-04-27 MED ORDER — SODIUM CHLORIDE 0.9 % IV SOLN
2.0000 g | Freq: Once | INTRAVENOUS | Status: DC
  Filled 2012-04-27: qty 20

## 2012-04-27 NOTE — Patient Care Conference (Signed)
Inpatient RehabilitationTeam Conference and Plan of Care Update Date: 04/27/2012   Time: 2:15 PM    Patient Name: Matthew Juarez      Medical Record Number: [id number]  Date of Birth: 12/04/1960 Sex: Male         Room/Bed: 4038/4038-01 Payor Info: Payor: BLUE CROSS BLUE [employer]IELD  Plan: [facility] PPO  Product Type: *No Product type*     Admitting Diagnosis: TRANS MYELITIS   Admit Date/Time:  04/20/2012  6:05 PM Admission Comments: No comment available   Primary Diagnosis:  Transverse myelitis Principal Problem: Transverse myelitis  Patient Active Problem List   Diagnosis Date Noted  . Transverse myelitis 04/21/2012  . Closed right ankle fracture 04/19/2012  . Lower paraplegia 04/17/2012  . Acute transverse myelitis 04/17/2012  . NSVT (nonsustained ventricular tachycardia) 04/17/2012  . Chronic back pain     Expected Discharge Date: Expected Discharge Date: 05/11/12  Team Members Present: Physician leading conference: Dr. Faith Rogue Social Worker Present: Amada Jupiter, LCSW Nurse Present: Daryll Brod, RN PT Present: Karolee Stamps, PT;Other (comment) Corinna Capra., PT) OT Present: Mackie Pai, OT;Patricia Mat Carne, OT Other (Discipline and Name): Tora Duck, PPS Coordinator     Current Status/Progress Goal Weekly Team Focus  Medical   transverse myelitis, no neuro change at present. further work up in progress  manage neuro issues. bowel bladder mgt, wound care  continue neuro management, set up pllasmapheresis   Bowel/Bladder   Continent of bladder. Incontinent of bowel, per report. LBM 04/25/12 per report  Continent of bowel and bladder.  Discuss bowel program   Swallow/Nutrition/ Hydration             ADL's   supervision UB bathing and dressing; mod A LB bathing and dressing bed level; min A toilet transfers; tot A toileting  mod I/supervision overall  LB bathing and dressing; toileting; bed mobility; transfers   Mobility   mod assist sit to stand, supervision  slide board level surfaces and slightly elevated surfaces to the left, but mod assist to the right to help manage right leg.  Car transfers supervision to the left and mod assist to the right, again to manage right leg.  Bed mobility improving supervision for supine to sit per OT, but still min assist to get right leg into the bed.  Biggest barrier: stairs to enter home.  Spoke with pt/wife that ramp would be a necessity at this point.  Wife agreed.  Other barrier is his HIGH bed.  Wife to take measurements so that we can accurately practice a squat transfer into the bed.    mod I WC mobility, transfers, bed mobility, removed gait and stair goal today.  Those were too ambitious.   transfers slide board and squat pivot, WC mobility, car transfers, standing tolerance, left leg coordination and control.    Communication             Safety/Cognition/ Behavioral Observations            Pain   No c/o pain. Pain managed with scheduled Neurotin  <3  Monitor   Skin   Skin abrasion to L shin x 2, OTA, unremarkable. Blistered area to inner groin. Allevyn dressing to area. Cast to RLE  No additional skin breakdown  Turn q 2hrs     Rehab Goals Patient on target to meet rehab goals: Yes *See Interdisciplinary Assessment and Plan and progress notes for long and short-term goals  Barriers to Discharge: neuro deficits. family understanding  Possible Resolutions to Barriers:  education, nmr, adaptive equipment, pain mgt    Discharge Planning/Teaching Needs:  Home with wife, son and other family to provide needed assistance.  Should be able to cover 24/7 care.      Team Discussion:  Making steady gains with therapies.  PT has spoken with wife about need for ramp at home by d/c.  Family remains very involved. Plan to begin plasmaphoresis.  Revisions to Treatment Plan:  D/c of gait and stair goals.  To focus on reaching modified independent w/c level goals.   Continued Need for Acute Rehabilitation Level  of Care: The patient requires daily medical management by a physician with specialized training in physical medicine and rehabilitation for the following conditions: Daily direction of a multidisciplinary physical rehabilitation program to ensure safe treatment while eliciting the highest outcome that is of practical value to the patient.: Yes Daily medical management of patient stability for increased activity during participation in an intensive rehabilitation regime.: Yes Daily analysis of laboratory values and/or radiology reports with any subsequent need for medication adjustment of medical intervention for : Post surgical problems;Neurological problems  Alban Marucci 04/27/2012, 4:09 PM

## 2012-04-27 NOTE — Progress Notes (Signed)
Patient ID: Matthew Juarez, male   DOB: 02-02-1961, 52 y.o.   MRN: [id number] Request received for placement of plasmapheresis catheter in pt with hx of transverse myelitis. Additional PMH as below. Exam: pt awake/alert; chest- CTA bilat; heart-RRR; abd- soft,+BS,NT.   Filed Vitals:   04/25/12 1529 04/26/12 0500 04/26/12 1613 04/27/12 0458  BP: 118/69 103/60 106/70 107/67  Pulse: 72 75 83 62  Temp: 98.7 F (37.1 C) 98.6 F (37 C) 98.3 F (36.8 C) 97.6 F (36.4 C)  TempSrc: Oral Oral Oral Oral  Resp: 20 20 18 18   Height:      Weight:      SpO2: 100% 98% 95% 97%   Past Medical History  Diagnosis Date  . Chronic back pain   . Hypertension   . Gout   . Headache   . Arthritis    No past surgical history on file. Dg Chest 2 View  04/16/2012  *RADIOLOGY REPORT*  Clinical Data: Numbness in legs.  Fall.  CHEST - 2 VIEW  Comparison: None.  Findings: Lateral views degraded by patient arm position.  Frontal view is mildly degraded by patient body habitus.  Decreased mid thoracic vertebral body height at numerous levels is favored to be within normal variation.  Midline trachea.  Normal heart size.  Mild left hemidiaphragm elevation. No pleural effusion or pneumothorax.  Clear lungs.  No free intraperitoneal air.  IMPRESSION: Mildly low lung volumes, without acute disease.   Original Report Authenticated By: Jeronimo Greaves, M.D.    Dg Ankle Complete Right  04/26/2012  *RADIOLOGY REPORT*  Clinical Data: Pain post fracture.  RIGHT ANKLE - COMPLETE 3+ VIEW  Comparison: 04/16/2012.  Findings: Overlying cast obscures fine osseous and soft tissue detail.  Oblique fracture of the distal right fibula with slight separation of fracture fragments.  Small ossific structure anterior aspect of the distal tibia unchanged.  Ankle mortise appears slightly widened laterally on this AP view although normal on the oblique view.  IMPRESSION: Overlying cast obscures fine osseous and soft tissue detail.  Oblique fracture of  the distal right fibula with slight separation of fracture fragments.  Small ossific structure anterior aspect of the distal tibia unchanged.  Ankle mortise appears slightly widened laterally on this AP view although normal on the oblique view.   Original Report Authenticated By: Lacy Duverney, M.D.    Dg Ankle Complete Right  04/16/2012  *RADIOLOGY REPORT*  Clinical Data: Trauma with pain and numbness.  RIGHT ANKLE - COMPLETE 3+ VIEW  Comparison: None.  Findings: Minimally displaced oblique fracture of the distal fibula with overlying soft tissue swelling. Base of fifth metatarsal and talar dome intact.  There is osseous irregularity about the anterior tibia on the lateral view.  IMPRESSION: Distal fibular fracture.  Anterior tibial osseous irregularity, favored to be degenerative.   Original Report Authenticated By: Jeronimo Greaves, M.D.    Mr Cervical Spine W Wo Contrast  04/17/2012  *RADIOLOGY REPORT*  Clinical Data:  Gradual onset of ascending numbness.  Recent fall. Now with significant progressive weakness of the right lower extremity.  No bowel or bladder difficulty.  Abnormal cerebrospinal fluid with elevated protein, and cells.  MRI CERVICAL AND THORACIC SPINE WITHOUT AND WITH CONTRAST  Technique:  Multiplanar and multiecho pulse sequences of the cervical spine, to include the craniocervical junction and cervicothoracic junction, and the thoracic spine, were obtained without and with intravenous contrast.  Contrast: 20mL MULTIHANCE GADOBENATE DIMEGLUMINE 529 MG/ML IV SOLN  Comparison:  None.  MRI  CERVICAL SPINE  Findings:  There is there is an intramedullary lower cervical process with prolonged T1 and T2 signal, centrally located within the cord.  The cord is diffusely enlarged from C5 downward into the thoracic region.  There is no syrinx or tonsillar herniation. Axial images demonstrate that the peripheral white matter fiber pathways are spared.  Post infusion, there is variable abnormal enhancement  which tends to be eccentric posteriorly in the cervicothoracic region.  Axial images reveal a central protrusion at C5-6 which effaces the anterior subarachnoid space but does not compress the cord or exiting nerve roots.  There is mild annular bulging at C6-7.  At C7- T1 there is asymmetric uncinate spurring on the right.  IMPRESSION: Intrinsic intramedullary cord lesion with prolonged T2 signal and variable abnormal enhancement.  Findings most consistent with idiopathic acute transverse myelitis.  Other considerations such as cord neoplasm, or multiple sclerosis are less likely. Cord infarction is not favored, given the post contrast enhancement.  MRI THORACIC SPINE  Findings: As noted in the cervical region, there is an intramedullary upper thoracic cord lesion with prolonged T1 and T2 signal located centrally within the cord.  This is continuous with the cervical lesion extending from C5 downward as far as the T6 vertebral body, below which the cord assumes a more normal size. The lesion is solid, expands the cord, and does not represent a dilated central canal.  Post infusion imaging demonstrates variable enhancement which tends to be peripheral.  There is no subarachnoid, subdural, or epidural extramedullary lesion.  Axial images through the thoracic disc spaces demonstrate minor disc pathology which is non compressive.  This can be seen at T1-2 on the left,  T2-T3 on the left, T3-4 on the right, and T4-5 on the right.  No vertebral body abnormality is seen.  No paraspinous enhancement or pleural effusion is noted.  IMPRESSION:  Intrinsic intramedullary cord lesion with prolonged T2 signal and variable abnormal enhancement.  Findings consistent with idiopathic acute transverse myelitis.  Other considerations such as cord neoplasm, or multiple sclerosis are less likely.  I discussed the findings with Dr. Ignacia Palma, EDP, shortly after completion of the study.   Original Report Authenticated By: Davonna Belling, M.D.     Mr Thoracic Spine W Wo Contrast  04/17/2012  *RADIOLOGY REPORT*  Clinical Data:  Gradual onset of ascending numbness.  Recent fall. Now with significant progressive weakness of the right lower extremity.  No bowel or bladder difficulty.  Abnormal cerebrospinal fluid with elevated protein, and cells.  MRI CERVICAL AND THORACIC SPINE WITHOUT AND WITH CONTRAST  Technique:  Multiplanar and multiecho pulse sequences of the cervical spine, to include the craniocervical junction and cervicothoracic junction, and the thoracic spine, were obtained without and with intravenous contrast.  Contrast: 20mL MULTIHANCE GADOBENATE DIMEGLUMINE 529 MG/ML IV SOLN  Comparison:  None.  MRI CERVICAL SPINE  Findings:  There is there is an intramedullary lower cervical process with prolonged T1 and T2 signal, centrally located within the cord.  The cord is diffusely enlarged from C5 downward into the thoracic region.  There is no syrinx or tonsillar herniation. Axial images demonstrate that the peripheral white matter fiber pathways are spared.  Post infusion, there is variable abnormal enhancement which tends to be eccentric posteriorly in the cervicothoracic region.  Axial images reveal a central protrusion at C5-6 which effaces the anterior subarachnoid space but does not compress the cord or exiting nerve roots.  There is mild annular bulging at C6-7.  At  C7- T1 there is asymmetric uncinate spurring on the right.  IMPRESSION: Intrinsic intramedullary cord lesion with prolonged T2 signal and variable abnormal enhancement.  Findings most consistent with idiopathic acute transverse myelitis.  Other considerations such as cord neoplasm, or multiple sclerosis are less likely. Cord infarction is not favored, given the post contrast enhancement.  MRI THORACIC SPINE  Findings: As noted in the cervical region, there is an intramedullary upper thoracic cord lesion with prolonged T1 and T2 signal located centrally within the cord.  This is  continuous with the cervical lesion extending from C5 downward as far as the T6 vertebral body, below which the cord assumes a more normal size. The lesion is solid, expands the cord, and does not represent a dilated central canal.  Post infusion imaging demonstrates variable enhancement which tends to be peripheral.  There is no subarachnoid, subdural, or epidural extramedullary lesion.  Axial images through the thoracic disc spaces demonstrate minor disc pathology which is non compressive.  This can be seen at T1-2 on the left,  T2-T3 on the left, T3-4 on the right, and T4-5 on the right.  No vertebral body abnormality is seen.  No paraspinous enhancement or pleural effusion is noted.  IMPRESSION:  Intrinsic intramedullary cord lesion with prolonged T2 signal and variable abnormal enhancement.  Findings consistent with idiopathic acute transverse myelitis.  Other considerations such as cord neoplasm, or multiple sclerosis are less likely.  I discussed the findings with Dr. Ignacia Palma, EDP, shortly after completion of the study.   Original Report Authenticated By: Davonna Belling, M.D.   Results for orders placed during the hospital encounter of 04/20/12  CBC WITH DIFFERENTIAL      Component Value Range   WBC 7.8  4.0 - 10.5 K/uL   RBC 4.65  4.22 - 5.81 MIL/uL   Hemoglobin 13.5  13.0 - 17.0 g/dL   HCT 65.7  84.6 - 96.2 %   MCV 86.7  78.0 - 100.0 fL   MCH 29.0  26.0 - 34.0 pg   MCHC 33.5  30.0 - 36.0 g/dL   RDW 95.2  84.1 - 32.4 %   Platelets 148 (*) 150 - 400 K/uL   Neutrophils Relative 76  43 - 77 %   Neutro Abs 5.9  1.7 - 7.7 K/uL   Lymphocytes Relative 16  12 - 46 %   Lymphs Abs 1.2  0.7 - 4.0 K/uL   Monocytes Relative 9  3 - 12 %   Monocytes Absolute 0.7  0.1 - 1.0 K/uL   Eosinophils Relative 0  0 - 5 %   Eosinophils Absolute 0.0  0.0 - 0.7 K/uL   Basophils Relative 0  0 - 1 %   Basophils Absolute 0.0  0.0 - 0.1 K/uL  COMPREHENSIVE METABOLIC PANEL      Component Value Range   Sodium 140  135  - 145 mEq/L   Potassium 4.3  3.5 - 5.1 mEq/L   Chloride 104  96 - 112 mEq/L   CO2 29  19 - 32 mEq/L   Glucose, Bld 113 (*) 70 - 99 mg/dL   BUN 29 (*) 6 - 23 mg/dL   Creatinine, Ser 4.01  0.50 - 1.35 mg/dL   Calcium 9.1  8.4 - 02.7 mg/dL   Total Protein 6.1  6.0 - 8.3 g/dL   Albumin 3.0 (*) 3.5 - 5.2 g/dL   AST 68 (*) 0 - 37 U/L   ALT 178 (*) 0 - 53 U/L  Alkaline Phosphatase 57  39 - 117 U/L   Total Bilirubin 1.1  0.3 - 1.2 mg/dL   GFR calc non Af Amer 82 (*) >90 mL/min   GFR calc Af Amer >90  >90 mL/min  CBC      Component Value Range   WBC 7.0  4.0 - 10.5 K/uL   RBC 4.56  4.22 - 5.81 MIL/uL   Hemoglobin 13.1  13.0 - 17.0 g/dL   HCT 16.1  09.6 - 04.5 %   MCV 85.5  78.0 - 100.0 fL   MCH 28.7  26.0 - 34.0 pg   MCHC 33.6  30.0 - 36.0 g/dL   RDW 40.9  81.1 - 91.4 %   Platelets 138 (*) 150 - 400 K/uL  URINE CULTURE      Component Value Range   Specimen Description URINE, CLEAN CATCH     Special Requests NONE     Culture  Setup Time 04/25/2012 21:41     Colony Count 7,000 COLONIES/ML     Culture INSIGNIFICANT GROWTH     Report Status 04/26/2012 FINAL    URINALYSIS, ROUTINE W REFLEX MICROSCOPIC      Component Value Range   Color, Urine AMBER (*) YELLOW   APPearance CLEAR  CLEAR   Specific Gravity, Urine 1.027  1.005 - 1.030   pH 5.0  5.0 - 8.0   Glucose, UA NEGATIVE  NEGATIVE mg/dL   Hgb urine dipstick NEGATIVE  NEGATIVE   Bilirubin Urine SMALL (*) NEGATIVE   Ketones, ur NEGATIVE  NEGATIVE mg/dL   Protein, ur NEGATIVE  NEGATIVE mg/dL   Urobilinogen, UA 1.0  0.0 - 1.0 mg/dL   Nitrite NEGATIVE  NEGATIVE   Leukocytes, UA NEGATIVE  NEGATIVE  CBC      Component Value Range   WBC 7.0  4.0 - 10.5 K/uL   RBC 4.16 (*) 4.22 - 5.81 MIL/uL   Hemoglobin 12.1 (*) 13.0 - 17.0 g/dL   HCT 78.2 (*) 95.6 - 21.3 %   MCV 85.6  78.0 - 100.0 fL   MCH 29.1  26.0 - 34.0 pg   MCHC 34.0  30.0 - 36.0 g/dL   RDW 08.6  57.8 - 46.9 %   Platelets 164  150 - 400 K/uL   A/P: Pt with hx of  extensive transverse myelitis. Plan is for plasmapheresis catheter placement today. Details/risks of procedure d/w pt/family with their understanding and consent.

## 2012-04-27 NOTE — Progress Notes (Signed)
Subjective: Patient has decided to proceed with pheresis.   Exam: Filed Vitals:   04/27/12 0458  BP: 107/67  Pulse: 62  Temp: 97.6 F (36.4 C)  Resp: 18   Gen: In bed, NAD MS: Awake, Alert, interactive and appropriate, no signs of aphasia ZO:XWRUE, EOMI Motor: 5/5 in UE, he has 2/5 hip flexor, but 2-3/5 knee flexors on right, unable to test distally due to cast.  Sensory:intact in UE, decreased to all modalitied in left leg.   Impression: 52 yo M with longitudinally extensive transverse myelitis. This is most often associated with neuromyelitis optica even without a history of optic neuritis. His ANA was only weakly positive. Though pheresis can be helpful in some cases, and it would be reasonable to try this, my suspicion is that it will not have much benefit in his case. That being said, with his continued weakness, I do think it reasonable to perform this.   Recommendations:  1) auquaporin-4 antibodies(NMO) has been drawn and is pending 2) Plasmapheresis x 5 treatments qod, first today 3) continue rehab.   Ritta Slot, MD  [facility]  [phone]

## 2012-04-27 NOTE — Progress Notes (Signed)
Physical Therapy Weekly Progress Note  Patient Details  Name: Matthew Juarez MRN: [id number] Date of Birth: 1961-03-24  Today's Date: 04/27/2012 Time:Session #1: 7829-5621, Session #2:  1322-1400 Time Calculation (min): Session #1: 56 min, Session #2: 38 min  Patient has met 2 of 5 short term goals.    Patient continues to demonstrate the following deficits: decreased right leg strength, decreased left leg coordination and sensation, decreased balance, decreased mobility, decreased standing tolerance and endurance, decreased balance and therefore will continue to benefit from skilled PT intervention to enhance overall performance with activity tolerance, balance, ability to compensate for deficits, functional use of  right lower extremity and left lower extremity and coordination.  Patient Progressing with goals, see new goals Week #2.  Continue plan of care. Gait and stair goals were discharged due to not appropriate at this time.    PT Short Term Goals Week 2:  PT Short Term Goal 1 (Week 2): Bed mobility both supine to sit and sit to supine mod I using whatever assitive device needed to maneuver right leg into and out of bed.   PT Short Term Goal 2 (Week 2): Squat pivot transfers to both left and right min assist to elevated surface to the left and lower surface to the right to simulate bed transfer at home.   PT Short Term Goal 3 (Week 2): Car transfer with slide board supervision PT Short Term Goal 4 (Week 2): WC mobility >150' with mod I assist needed only for parts management( leg rests and armrest).   PT Short Term Goal 5 (Week 2): Standing at mat table with RW x 2 mins mod assist to stabilize RW from elevated surface while maintaining NWB right foot.  Skilled Therapeutic Interventions/Progress Updates:    Session #1: This session focused on WC mobility 150' x 2 supervision and cues needed to watch his right foot while turning and navigating obstacles.  Standing in parallel bars with son,  "Dre" stabilizing WC mod assist to help control and stabilize left leg which likes to move posteriorly just before he goes to stand today.  In standing working on standing tolerance (increased time on left leg), trying not to lock out left knee to work on strength and stability and slow transition to sitting (unlocking left knee slowly if he is standing and lowering slowly if he is not locked out at the left knee).  Car transfers using sliding board min assist to help manage right foot both into and out of the car. Pt/wife have decided he would be more comfortable in the back seat long sitting instead of trying to get his feet into the car in the front seat.  He has sat this way before in the back and knows he can fit.  We simulated this scenario with the car transfer today.    Session #2:  This session focused again on WC mobility to and from the gym 150'x2 supervision.  Simulated transfer from [facility] to left to higher mat table that was elevated to 1/2 the height of his bed, 3/4 the height of his bed, and then full height of his bed doing a squat/scoot transfer both into and off of the mat table.  Up to mod assist going to the right, min assist to the left with max verbal cues for safe technique going in both directions.  Sit to stand at mat table with two person mod assist.  His son was stabilizing the RW and I was stabilizing his left  leg so that it would not move mid transfer.  Pt able to stand for ~ 1 minute before uncontrolled descent to sit on high mat table.  Pt's left leg is still jumping backwards just before he goes to stand and I have to make sure it doesn't move or else the pt is standing with his leg too posterior to his body.       Therapy Documentation Precautions:  Precautions Precautions: Fall Precaution Comments: loss of sensation or proprioception (LLE worse than RLE) with loss of strength R>L (trace to no strength in right leg all the way up to hip) Required Braces or Orthoses: Other  Brace/Splint Other Brace/Splint: hard splint -> RLE Restrictions Weight Bearing Restrictions: Yes RLE Weight Bearing: Non weight bearing LLE Weight Bearing: Weight bearing as tolerated General:   Vital Signs: Therapy Vitals Pulse Rate: 75  Resp: 19  BP: 106/65 mmHg Patient Position, if appropriate: Lying Oxygen Therapy SpO2: 95 % (R AIR) O2 Device: Nasal cannula O2 Flow Rate (L/min): 2 L/min Pulse Oximetry Type: Continuous SpO2 Alarm Limit Low: 34  Locomotion : Wheelchair Mobility Distance: 150   See FIM for current functional status  Therapy/Group: Individual Therapy  Lurena Joiner B. Duglas Heier, PT, DPT [phone]   04/27/2012, 5:08 PM

## 2012-04-27 NOTE — Progress Notes (Signed)
Patient ID: Matthew Juarez, male   DOB: [birth year], 52 y.o.   MRN: [id number]   Temporary Plasma pheresis catheter has been requested by Neurology Visited pt yesterday 230pm Pt refusing  Call us if need [place]

## 2012-04-27 NOTE — Progress Notes (Signed)
Subjective/Complaints: Had a better night. Had questions about ankle xrays. Still deciding on plasmapheresis. No pain. In good spirits  A 12 point review of systems has been performed and if not noted above is otherwise negative.   Objective: Vital Signs: Blood pressure 107/67, pulse 62, temperature 97.6 F (36.4 C), temperature source Oral, resp. rate 18, height 6\' 5"  (1.956 m), weight 133.6 kg (294 lb 8.6 oz), SpO2 97.00%. Dg Ankle Complete Right  04/26/2012  *RADIOLOGY REPORT*  Clinical Data: Pain post fracture.  RIGHT ANKLE - COMPLETE 3+ VIEW  Comparison: 04/16/2012.  Findings: Overlying cast obscures fine osseous and soft tissue detail.  Oblique fracture of the distal right fibula with slight separation of fracture fragments.  Small ossific structure anterior aspect of the distal tibia unchanged.  Ankle mortise appears slightly widened laterally on this AP view although normal on the oblique view.  IMPRESSION: Overlying cast obscures fine osseous and soft tissue detail.  Oblique fracture of the distal right fibula with slight separation of fracture fragments.  Small ossific structure anterior aspect of the distal tibia unchanged.  Ankle mortise appears slightly widened laterally on this AP view although normal on the oblique view.   Original Report Authenticated By: Lacy Duverney, M.D.     Basename 04/27/12 0525 04/24/12 0723  WBC 7.0 7.0  HGB 12.1* 13.1  HCT 35.6* 39.0  PLT 164 138*   No results found for this basename: NA:2,K:2,CL:2,CO:2,GLUCOSE:2,BUN:2,CREATININE:2,CALCIUM:2 in the last 72 hours CBG (last 3)  No results found for this basename: GLUCAP:3 in the last 72 hours  Wt Readings from Last 3 Encounters:  04/20/12 133.6 kg (294 lb 8.6 oz)  04/17/12 133.6 kg (294 lb 8.6 oz)  12/05/11 139.708 kg (308 lb)    Physical Exam:  Constitutional: He is oriented to person, place, and time. He appears well-developed.  HENT:  Head: Normocephalic.  Eyes:  Pupils round and reactive to  light  Neck: Normal range of motion. Neck supple. No thyromegaly present.  Cardiovascular: Normal rate and regular rhythm. No murmurs  Pulmonary/Chest: Effort normal and breath sounds normal. No respiratory distress.  Abdominal: Soft. Bowel sounds are normal. He exhibits no distension. There is no tenderness.  Musculoskeletal:  Right ankle in splint  Neurological: He is alert and oriented to person, place, and time.  Follows full commands. T8 sensory level. Trace to absent sensation on the left below level, slightly diminished sense to PP/LT on right.(does have paresthesias) RLE remains 1 prox to tr-1 distally at the ankle. LLE is 3+ to 4/5 proximal to distal. UE's are 5/5. Cognitively intact. DTR's are 2+. Cast in place  Skin: Small area of breakdown on right inner thigh, approximately 2cm Psychiatric: He has a normal mood and affect. His behavior is normal. Judgment and thought content normal       Assessment/Plan: 1. Functional deficits secondary to cervico-thoracic transverse myelitis (clinically T8 level) with Brown-Sequard presentation which require 3+ hours per day of interdisciplinary therapy in a comprehensive inpatient rehab setting. Physiatrist is providing close team supervision and 24 hour management of active medical problems listed below. Physiatrist and rehab team continue to assess barriers to discharge/monitor patient progress toward functional and medical goals.   FIM: FIM - Bathing Bathing Steps Patient Completed: Chest;Right Arm;Left Arm;Abdomen;Front perineal area;Right upper leg;Left upper leg Bathing: 3: Mod-Patient completes 5-7 22f 10 parts or 50-74%  FIM - Upper Body Dressing/Undressing Upper body dressing/undressing steps patient completed: Thread/unthread right sleeve of pullover shirt/dresss;Thread/unthread left sleeve of pullover shirt/dress;Put head through  opening of pull over shirt/dress;Pull shirt over trunk Upper body dressing/undressing: 5: Set-up assist  to: Obtain clothing/put away FIM - Lower Body Dressing/Undressing Lower body dressing/undressing steps patient completed: Pull pants up/down;Thread/unthread left pants leg;Don/Doff left shoe;Fasten/unfasten left shoe Lower body dressing/undressing: 3: Mod-Patient completed 50-74% of tasks  FIM - Toileting Toileting steps completed by patient: Adjust clothing prior to toileting;Performs perineal hygiene;Adjust clothing after toileting Toileting: 0: Activity did not occur  FIM - Diplomatic Services operational officer Devices: Bedside commode;Sliding board (Drop Arm BSC) Toilet Transfers: 0-Activity did not occur  FIM - Banker Devices: Sliding board Bed/Chair Transfer: 5: Supine > Sit: Supervision (verbal cues/safety issues);4: Bed > Chair or W/C: Min A (steadying Pt. > 75%)  FIM - Locomotion: Wheelchair Distance: 150 Locomotion: Wheelchair: 0: Activity did not occur FIM - Locomotion: Ambulation Ambulation/Gait Assistance: Not tested (comment) Locomotion: Ambulation: 0: Activity did not occur  Comprehension Comprehension Mode: Auditory Comprehension: 5-Follows basic conversation/direction: With no assist  Expression Expression Mode: Verbal Expression: 5-Expresses complex 90% of the time/cues < 10% of the time  Social Interaction Social Interaction: 6-Interacts appropriately with others with medication or extra time (anti-anxiety, antidepressant).  Problem Solving Problem Solving: 7-Solves complex problems: Recognizes & self-corrects  Memory Memory: 5-Recognizes or recalls 90% of the time/requires cueing < 10% of the time  Medical Problem List and Plan:  1. cervico-thoracic transverse myelitis, T8 with Brown-Squard clinical presentation.   -appreciate Neurology follow up  -potential plasmapheresis   -given memory issues and potential dx will order an MRI of the brain 2. Right distal fibular fracture. Patient is nonweightbearing.  In cast. xr still shows distracted fragments- keep NWB pending ortho follow up 3. DVT Prophylaxis/Anticoagulation: right popliteal DVT. Increased to mg/kg lovenox for 2wks then recheck dopplers. 4. Pain Management: Hydrocodone as needed. Monitor with increased activity   -increased neurontin to 300 tid which has helped dysesthesias 5. Neuropsych: This patient is capable of making decisions on his/her own behalf.  6. Hypertension. Norvasc 5 mg daily, Avapro 150 mg daily. Monitor with increased activity. Normotensive at present  7. History of gout. Allopurinol daily. Monitor for any flareups  8. Prerenal azotemia: push liquids 9.   allevyn patch over thigh wound with EPBC over thighs- keep legs separated as possible. Towel would be helpful LOS (Days) 7 A FACE TO FACE EVALUATION WAS PERFORMED  SWARTZ,ZACHARY T 04/27/2012 6:52 AM

## 2012-04-27 NOTE — Progress Notes (Signed)
Occupational Therapy Session Note  Patient Details  Name: Matthew Juarez MRN: [id number] Date of Birth: 1960/06/25  Today's Date: 04/27/2012  Session 1 Time: 0800-0856 Time Calculation (min): 56 min  Short Term Goals: Week 1:  OT Short Term Goal 1 (Week 1): Patient will donn pants at bed level with min assist using AE prn OT Short Term Goal 2 (Week 1): Patient will engage in bed mobility with min assist in order to donn shirt sitting edge of bed OT Short Term Goal 3 (Week 1): Toilet transfer <-> [place] will be attempted with patient using AE prn OT Short Term Goal 4 (Week 1): Patient will complete grooming tasks seated in w/c at sink with set-up assist  Skilled Therapeutic Interventions/Progress Updates:    Pt in bed with wife at bedside.  Pt performed rolling side to side with min A for managing RLE for therapist to bathe buttocks and don brief.  Pt performed supine->sit EOB with supervision.  Pt managing and positioning RLE using BUEs.  Pt completed remainder of bathing and dressing seated EOB using AE prn for LB dressing.  Pt required assistance initially threading RLE into pants but was able to complete tasks.  Pt performed sliding board transfers including board placement with assistance only for managing RLE.  Discussed toilet transfers and toileting with wife and patient.  Focus on activity tolerance, sitting balance, transfers, clothing management, bed mobility, and safety awareness.  Therapy Documentation Precautions:  Precautions Precautions: Fall Precaution Comments: loss of sensation or proprioception (LLE worse than RLE) with loss of strength R>L (trace to no strength in right leg all the way up to hip) Required Braces or Orthoses: Other Brace/Splint Other Brace/Splint: hard splint -> RLE Restrictions Weight Bearing Restrictions: Yes RLE Weight Bearing: Non weight bearing LLE Weight Bearing: Weight bearing as tolerated Pain: Pain Assessment Pain Assessment: No/denies  pain  See FIM for current functional status  Therapy/Group: Individual Therapy  Session 2 Time: 1100-1130 Pt denies pain Individual Therapy Pt's wife and son present to observe patient.  Pt practiced w/c<>bed transfers, bed<>drop arm BSC transfers, clothing management, and bed mobility.  Pt completed all tasks with steady assist.  Pt stated that his bed was much taller than bed practiced on.  Discussed progressing to complete squat pivot transfers.    Lavone Neri [facility] 04/27/2012, 8:58 AM

## 2012-04-28 ENCOUNTER — Inpatient Hospital Stay (HOSPITAL_COMMUNITY): Payer: BC Managed Care – PPO | Admitting: Physical Therapy

## 2012-04-28 ENCOUNTER — Encounter (HOSPITAL_COMMUNITY): Payer: BC Managed Care – PPO | Admitting: Occupational Therapy

## 2012-04-28 ENCOUNTER — Inpatient Hospital Stay (HOSPITAL_COMMUNITY): Payer: BC Managed Care – PPO

## 2012-04-28 ENCOUNTER — Inpatient Hospital Stay (HOSPITAL_COMMUNITY): Payer: BC Managed Care – PPO | Admitting: Occupational Therapy

## 2012-04-28 DIAGNOSIS — I469 Cardiac arrest, cause unspecified: Secondary | ICD-10-CM

## 2012-04-28 MED ORDER — ACD FORMULA A 0.73-2.45-2.2 GM/100ML VI SOLN
Status: AC
  Administered 2012-04-28: 05:00:00
  Filled 2012-04-28: qty 500

## 2012-04-28 MED ORDER — SENNOSIDES-DOCUSATE SODIUM 8.6-50 MG PO TABS
2.0000 | ORAL_TABLET | Freq: Every day | ORAL | Status: DC
  Administered 2012-04-28 – 2012-05-10 (×13): 2 via ORAL
  Filled 2012-04-28 (×9): qty 2
  Filled 2012-04-28: qty 1
  Filled 2012-04-28 (×2): qty 2

## 2012-04-28 MED ORDER — BISACODYL 10 MG RE SUPP
10.0000 mg | Freq: Every day | RECTAL | Status: DC
  Administered 2012-04-30 – 2012-05-10 (×11): 10 mg via RECTAL
  Filled 2012-04-28 (×10): qty 1

## 2012-04-28 MED FILL — Heparin Sodium (Porcine) Inj 1000 Unit/ML: INTRAMUSCULAR | Qty: 10 | Status: AC

## 2012-04-28 NOTE — Plan of Care (Signed)
Problem: SCI BOWEL ELIMINATION Goal: RH STG SCI MANAGE BOWEL WITH MEDICATION WITH ASSISTANCE STG SCI Manage bowel with medication with min assistance.  Outcome: Not Progressing incontinent

## 2012-04-28 NOTE — Progress Notes (Signed)
Occupational Therapy Note  Patient Details  Name: Matthew Juarez MRN: [id number] Date of Birth: [birth year] Today's Date: 04/28/2012  Time In:  0900 Time Out:  10:05.  Individual session no c/o pain.  Treatment focused on ADL retraining at EOB level with focus on bed mobility (rolling, supine to sit, lateral leans), increasing activity tolerance, sitting balance, forward bending to feet to address mobility, bed to wheelchair transfers (squat pivot).  Pt is able to do sliding board transfers with only supervision;  Working on [employer] pivot transfers for commode and for ease in functional transfers during the day.  Wife present during ADL session.   Norton Pastel 04/28/2012, 10:17 AM

## 2012-04-28 NOTE — Plan of Care (Signed)
Problem: SCI BOWEL ELIMINATION Goal: RH STG MANAGE BOWEL WITH ASSISTANCE STG Manage Bowel with min Assistance.  Outcome: Progressing Large BM today 1/29 but incontinent

## 2012-04-28 NOTE — Progress Notes (Signed)
NEURO HOSPITALIST PROGRESS NOTE   SUBJECTIVE:                                                                                                                        No significant improvement after completing 5 days high dose IV solumedrol. Received first plasmapheresis treatment earlier today. Stated that he has good strength but no feeling from the left thigh all the way down to the left foot, and only minimal movement right lower extremity. Serum NMO-IGG  Antibodies pending. Repeat MRI brain: unimpressive.  OBJECTIVE:                                                                                                                          Head: normocephalic. Neck: supple. Cardiac: no murmurs. Lungs: clear. Abdomen: soft. Extremities: no edema. Vital signs in last 24 hours: Temp:  [96.9 F (36.1 C)-98.9 F (37.2 C)] 98.5 F (36.9 C) (01/29 0542) Pulse Rate:  [61-75] 65  (01/29 0542) Resp:  [5-19] 18  (01/29 0542) BP: (85-136)/(46-73) 102/49 mmHg (01/29 0759) SpO2:  [94 %-100 %] 99 % (01/29 0542) Weight:  [133.3 kg (293 lb 14 oz)] 133.3 kg (293 lb 14 oz) (01/29 0552)  Intake/Output from previous day: 01/28 0701 - 01/29 0700 In: 480 [P.O.:480] Out: 1350 [Urine:1350] Intake/Output this shift:   Nutritional status: Cardiac  Past Medical History  Diagnosis Date  . Chronic back pain   . Hypertension   . Gout   . Headache   . Arthritis     Neurologic ROS negative with exception of above. Musculoskeletal ROS: lower extremity weakness, right greater than left.  Neurologic Exam:  MS: Awake, Alert, interactive and appropriate, no signs of aphasia  CN 2-12: intact. Motor: 5/5 in UE, he has 2/5 hip flexor, but 2-3/5 knee flexors on right, unable to test distally due to cast.  Sensory:intact in UE, decreased to all modalitied in left leg.    Lab Results: No results found for this basename: cbc, bmp, coags, chol, tri, ldl, hga1c    Lipid Panel No results found for this basename: CHOL,TRIG,HDL,CHOLHDL,VLDL,LDLCALC in the last 72 hours  Studies/Results: Dg Ankle Complete Right  04/26/2012  *RADIOLOGY REPORT*  Clinical Data: Pain post fracture.  RIGHT ANKLE -  COMPLETE 3+ VIEW  Comparison: 04/16/2012.  Findings: Overlying cast obscures fine osseous and soft tissue detail.  Oblique fracture of the distal right fibula with slight separation of fracture fragments.  Small ossific structure anterior aspect of the distal tibia unchanged.  Ankle mortise appears slightly widened laterally on this AP view although normal on the oblique view.  IMPRESSION: Overlying cast obscures fine osseous and soft tissue detail.  Oblique fracture of the distal right fibula with slight separation of fracture fragments.  Small ossific structure anterior aspect of the distal tibia unchanged.  Ankle mortise appears slightly widened laterally on this AP view although normal on the oblique view.   Original Report Authenticated By: Lacy Duverney, M.D.    Mr Laqueta Jean Wo Contrast  04/28/2012  *RADIOLOGY REPORT*  Clinical Data: History of transverse myelitis.  MRI HEAD WITHOUT AND WITH CONTRAST  Technique:  Multiplanar, multiecho pulse sequences of the brain and surrounding structures were obtained according to standard protocol without and with intravenous contrast  Contrast: 20mL MULTIHANCE GADOBENATE DIMEGLUMINE 529 MG/ML IV SOLN  Comparison: No comparison head CT or brain MR.  Comparison cervical spine and thoracic spine MR 04/16/2012.  Findings: No acute infarct.  No intracranial hemorrhage.  Tiny T2 hyperintensity left frontal lobe without definitive findings of intracranial demyelinating process.  Right periatrial developmental venous anomaly otherwise no intracranial mass or abnormal enhancement noted.  Exophthalmos.  Small vertebral arteries and basilar artery.  Major intracranial vascular structures are patent.  Cerebellar tonsils minimally low-lying but within  the range of normal limits.  Minimal partial opacification/mucosal thickening ethmoid sinus air cells.  IMPRESSION: No definitive findings of demyelinating process.  There is a single punctate left frontal T2 hyperintensity.  Right periatrial developmental venous anomaly otherwise no intracranial mass or abnormal enhancement.  Exophthalmos.   Original Report Authenticated By: Lacy Duverney, M.D.    Dg Chest Port 1 View  04/28/2012  *RADIOLOGY REPORT*  Clinical Data: Hemodialysis catheter placement  PORTABLE CHEST - 1 VIEW  Comparison: 1.17.14  Findings: Right IJ approach large bore catheter tips project over the proximal and mid SVC.  Hypoaeration.  Elevated left hemidiaphragm.  No confluent airspace opacity.  No pleural effusion or pneumothorax.  Cardiomediastinal contours unchanged.  No acute osseous finding.  IMPRESSION: Right IJ approach large bore catheter tips project over the SVC. Otherwise, no acute process identified.   Original Report Authenticated By: Jearld Lesch, M.D.     MEDICATIONS                                                                                                                       I have reviewed the patient's current medications.  ASSESSMENT/PLAN:  52 yo M with longitudinally extensive transverse myelitis. 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 Awaiting serum NMO-IGG antibodies. Continue PF x 5 days and PT. Will follow up.  Wyatt Portela, MD [facility] [phone]  04/28/2012, 9:16 AM

## 2012-04-28 NOTE — Progress Notes (Signed)
Physical Therapy Note  Patient Details  Name: Matthew Juarez MRN: [id number] Date of Birth: Aug 14, 1960 Today's Date: 04/28/2012  Time: 1415-1455 40 minutes  No c/o pain.  Treatment focused on squat pivot transfer training to various height surfaces both directions.  Pt performed multiple attempts requiring min A for higher surface transfers, supervision for level surfaces to R, min A to L.  Pt with good safety awareness and able to instruct PT in proper set up and safety.  W/c mobility for activity tolerance 2 x 150'.  Individual therapy   Daniil Labarge 04/28/2012, 2:55 PM

## 2012-04-28 NOTE — Progress Notes (Signed)
Subjective/Complaints: Pheresis very late last night. Otherwise did well.   A 12 point review of systems has been performed and if not noted above is otherwise negative.   Objective: Vital Signs: Blood pressure 90/60, pulse 65, temperature 98.5 F (36.9 C), temperature source Oral, resp. rate 18, height 6\' 5"  (1.956 m), weight 133.3 kg (293 lb 14 oz), SpO2 99.00%. Dg Ankle Complete Right  04/26/2012  *RADIOLOGY REPORT*  Clinical Data: Pain post fracture.  RIGHT ANKLE - COMPLETE 3+ VIEW  Comparison: 04/16/2012.  Findings: Overlying cast obscures fine osseous and soft tissue detail.  Oblique fracture of the distal right fibula with slight separation of fracture fragments.  Small ossific structure anterior aspect of the distal tibia unchanged.  Ankle mortise appears slightly widened laterally on this AP view although normal on the oblique view.  IMPRESSION: Overlying cast obscures fine osseous and soft tissue detail.  Oblique fracture of the distal right fibula with slight separation of fracture fragments.  Small ossific structure anterior aspect of the distal tibia unchanged.  Ankle mortise appears slightly widened laterally on this AP view although normal on the oblique view.   Original Report Authenticated By: Lacy Duverney, M.D.    Dg Chest Port 1 View  04/28/2012  *RADIOLOGY REPORT*  Clinical Data: Hemodialysis catheter placement  PORTABLE CHEST - 1 VIEW  Comparison: 1.17.14  Findings: Right IJ approach large bore catheter tips project over the proximal and mid SVC.  Hypoaeration.  Elevated left hemidiaphragm.  No confluent airspace opacity.  No pleural effusion or pneumothorax.  Cardiomediastinal contours unchanged.  No acute osseous finding.  IMPRESSION: Right IJ approach large bore catheter tips project over the SVC. Otherwise, no acute process identified.   Original Report Authenticated By: Jearld Lesch, M.D.     Basename 04/27/12 0525  WBC 7.0  HGB 12.1*  HCT 35.6*  PLT 164   No  results found for this basename: NA:2,K:2,CL:2,CO:2,GLUCOSE:2,BUN:2,CREATININE:2,CALCIUM:2 in the last 72 hours CBG (last 3)  No results found for this basename: GLUCAP:3 in the last 72 hours  Wt Readings from Last 3 Encounters:  04/28/12 133.3 kg (293 lb 14 oz)  04/17/12 133.6 kg (294 lb 8.6 oz)  12/05/11 139.708 kg (308 lb)    Physical Exam:  Constitutional: He is oriented to person, place, and time. He appears well-developed.  HENT:  Head: Normocephalic.  Eyes:  Pupils round and reactive to light  Neck: Normal range of motion. Neck supple. No thyromegaly present.  Cardiovascular: Normal rate and regular rhythm. No murmurs  Pulmonary/Chest: Effort normal and breath sounds normal. No respiratory distress.  Abdominal: Soft. Bowel sounds are normal. He exhibits no distension. There is no tenderness.  Musculoskeletal:  Right ankle in splint  Neurological: He is alert and oriented to person, place, and time.  Follows full commands. T8 sensory level. Trace to absent sensation on the left below level, slightly diminished sense to PP/LT on right.(does have paresthesias) RLE remains 1 prox to tr-1 distally at the ankle. LLE is 3+ to 4/5 proximal to distal. UE's are 5/5. Cognitively intact. DTR's are 2+. Cast in place  Skin: thigh breakdown areas covered with allevyn which needs to be replaced Psychiatric: He has a normal mood and affect. His behavior is normal. Judgment and thought content normal       Assessment/Plan: 1. Functional deficits secondary to cervico-thoracic transverse myelitis (clinically T8 level) with Brown-Sequard presentation which require 3+ hours per day of interdisciplinary therapy in a comprehensive inpatient rehab setting. Physiatrist is providing  close team supervision and 24 hour management of active medical problems listed below. Physiatrist and rehab team continue to assess barriers to discharge/monitor patient progress toward functional and medical  goals.   FIM: FIM - Bathing Bathing Steps Patient Completed: Chest;Right Arm;Left Arm;Abdomen;Front perineal area;Right upper leg;Left upper leg Bathing: 4: Min-Patient completes 8-9 51f 10 parts or 75+ percent  FIM - Upper Body Dressing/Undressing Upper body dressing/undressing steps patient completed: Thread/unthread right sleeve of pullover shirt/dresss;Thread/unthread left sleeve of pullover shirt/dress;Put head through opening of pull over shirt/dress;Pull shirt over trunk Upper body dressing/undressing: 5: Set-up assist to: Obtain clothing/put away FIM - Lower Body Dressing/Undressing Lower body dressing/undressing steps patient completed: Thread/unthread left pants leg;Pull pants up/down;Don/Doff left shoe;Fasten/unfasten left shoe Lower body dressing/undressing: 4: Min-Patient completed 75 plus % of tasks  FIM - Toileting Toileting steps completed by patient: Adjust clothing prior to toileting;Performs perineal hygiene;Adjust clothing after toileting Toileting: 0: Activity did not occur  FIM - Diplomatic Services operational officer Devices: Bedside commode;Sliding board (Drop Arm BSC) Toilet Transfers: 0-Activity did not occur  FIM - Banker Devices: Sliding board;Arm rests Bed/Chair Transfer: 5: Supine > Sit: Supervision (verbal cues/safety issues);4: Bed > Chair or W/C: Min A (steadying Pt. > 75%);4: Chair or W/C > Bed: Min A (steadying Pt. > 75%)  FIM - Locomotion: Wheelchair Distance: 150 Locomotion: Wheelchair: 5: Travels 150 ft or more: maneuvers on rugs and over door sills with supervision, cueing or coaxing FIM - Locomotion: Ambulation Ambulation/Gait Assistance: Not tested (comment) Locomotion: Ambulation: 0: Activity did not occur  Comprehension Comprehension Mode: Auditory Comprehension: 5-Follows basic conversation/direction: With no assist  Expression Expression Mode: Verbal Expression: 5-Expresses complex 90% of  the time/cues < 10% of the time  Social Interaction Social Interaction: 6-Interacts appropriately with others with medication or extra time (anti-anxiety, antidepressant).  Problem Solving Problem Solving: 7-Solves complex problems: Recognizes & self-corrects  Memory Memory: 5-Recognizes or recalls 90% of the time/requires cueing < 10% of the time  Medical Problem List and Plan:  1. cervico-thoracic transverse myelitis, T8 with Brown-Squard clinical presentation.   -appreciate Neurology follow up  -plasmapheresis early this am, rx 1 of 5   -MRI doesn't appear to show any focal lesions. Does show a little atrophy. Await formal reading 2. Right distal fibular fracture. Patient is nonweightbearing. In cast. xr still shows distracted fragments- keep NWB pending ortho follow up 3. DVT Prophylaxis/Anticoagulation: right popliteal DVT. Increased to mg/kg lovenox for 2wks then recheck dopplers. 4. Pain Management: Hydrocodone as needed. Monitor with increased activity   -increased neurontin to 300 tid which has helped dysesthesias 5. Neuropsych: This patient is capable of making decisions on his/her own behalf.  6. Hypertension. Norvasc 5 mg daily, Avapro 150 mg daily. Monitor with increased activity. Normotensive at present  7. History of gout. Allopurinol daily. Monitor for any flareups  8. Prerenal azotemia: pushing liquids. Follow up labs pending 9.   allevyn patch over thigh wound with EPBC over thighs- keep legs separated as possible. Towel would be helpful 10. Neurogenic bowel: will work on am bowel program with nursing LOS (Days) 8 A FACE TO FACE EVALUATION WAS PERFORMED  SWARTZ,ZACHARY T 04/28/2012 7:14 AM

## 2012-04-28 NOTE — Plan of Care (Signed)
Problem: SCI BOWEL ELIMINATION Goal: RH STG MANAGE BOWEL WITH ASSISTANCE STG Manage Bowel with min Assistance.  Outcome: Not Progressing LBM 04-25-12 Goal: RH STG SCI MANAGE BOWEL WITH MEDICATION WITH ASSISTANCE STG SCI Manage bowel with medication with min assistance.  Outcome: Not Progressing LBM 04-25-12 Goal: RH STG MANAGE BOWEL W/EQUIPMENT W/ASSISTANCE STG Manage Bowel With Equipment With mod Assistance  LBM 04-25-12 Goal: RH STG SCI MANAGE BOWEL PROGRAM W/ASSIST OR AS APPROPRIATE STG SCI Manage bowel program w/assist or as appropriate.  Outcome: Not Progressing LBM 04-25-12

## 2012-04-28 NOTE — Progress Notes (Signed)
Occupational Therapy Session Note  Patient Details  Name: Matthew Juarez MRN: [id number] Date of Birth: [birth year]  Today's Date: 04/28/2012 Time: 1300-1330 Time Calculation (min): 30 min  Short Term Goals: Week 1:  OT Short Term Goal 1 (Week 1): Patient will donn pants at bed level with min assist using AE prn OT Short Term Goal 2 (Week 1): Patient will engage in bed mobility with min assist in order to donn shirt sitting edge of bed OT Short Term Goal 3 (Week 1): Toilet transfer <-> [facility] will be attempted with patient using AE prn OT Short Term Goal 4 (Week 1): Patient will complete grooming tasks seated in w/c at sink with set-up assist  Skilled Therapeutic Interventions/Progress Updates:    1:1 focus on w/c<> drop arm BSC squat pivot transfers without slide board. Pt required Dycem under left LE to stabilize in transfer with min A; lateral leans for clothing management (up and down). Pt required multiple leans to get pants back up and min A to adjust clothing throughout the process and min A to stabilize left LE due to LE flexed underneath of him. Discussed safe options for putting BSC in room at home to make it stable and w/c setup when not using the board.   Therapy Documentation Precautions:  Precautions Precautions: Fall Precaution Comments: loss of sensation or proprioception (LLE worse than RLE) with loss of strength R>L (trace to no strength in right leg all the way up to hip) Required Braces or Orthoses: Other Brace/Splint Other Brace/Splint: hard splint -> RLE Restrictions Weight Bearing Restrictions: Yes RLE Weight Bearing: Non weight bearing LLE Weight Bearing: Weight bearing as tolerated General:   Vital Signs: Therapy Vitals Temp: 97.9 F (36.6 C) Temp src: Oral Pulse Rate: 80  Resp: 18  BP: 96/61 mmHg Patient Position, if appropriate: Sitting Oxygen Therapy SpO2: 96 % O2 Device: None (Room air) Pain:  no c/o pain in session  See FIM for current functional  status  Therapy/Group: Individual Therapy  Roney Mans [facility] 04/28/2012, 4:02 PM

## 2012-04-28 NOTE — Progress Notes (Signed)
xrays ok Continue cast rov 10 days Continue nwb

## 2012-04-28 NOTE — Progress Notes (Signed)
Physical Therapy Session Note  Patient Details  Name: Matthew Juarez MRN: [id number] Date of Birth: 02/02/1961  Today's Date: 04/28/2012 Time: 1005-1100 Time Calculation (min): 55 min  Short Term Goals: Week 2:  PT Short Term Goal 1 (Week 2): Bed mobility both supine to sit and sit to supine mod I using whatever assitive device needed to maneuver right leg into and out of bed.   PT Short Term Goal 2 (Week 2): Squat pivot transfers to both left and right min assist to elevated surface to the left and lower surface to the right to simulate bed transfer at home.   PT Short Term Goal 3 (Week 2): Car transfer with slide board supervision PT Short Term Goal 4 (Week 2): WC mobility >150' with mod I assist needed only for parts management( leg rests and armrest).   PT Short Term Goal 5 (Week 2): Standing at mat table with RW x 2 mins mod assist to stabilize RW from elevated surface while maintaining NWB right foot.  Skilled Therapeutic Interventions/Progress Updates:    Treatment focused on functional transfers using squat pivot technique progressing height of surface  (22-24") to increase challenge to both R and L w/c <->mat. Also worked on scooting along the edge of mat (R and L) with emphasis on lift to aid with squat pivot transfer and education on importance of complete lift for skin protection. Pt's wife and son present during session and commented on improved mobility/transfers. Per pt report and family report, more control noted in LLE during transfers and mobility. Assist needed for w/c parts management to set up for transfer and propelled w/c to/from therapy with S for endurance and strengthening.   Therapy Documentation Precautions:  Precautions Precautions: Fall Precaution Comments: loss of sensation or proprioception (LLE worse than RLE) with loss of strength R>L (trace to no strength in right leg all the way up to hip) Required Braces or Orthoses: Other Brace/Splint Other Brace/Splint:  hard splint -> RLE Restrictions Weight Bearing Restrictions: Yes RLE Weight Bearing: Non weight bearing LLE Weight Bearing: Weight bearing as tolerated   Pain:  Denies pain.  See FIM for current functional status  Therapy/Group: Individual Therapy  Karolee Stamps [facility] 04/28/2012, 12:02 PM

## 2012-04-28 NOTE — Progress Notes (Signed)
Social Work Patient ID: Matthew Juarez, male   DOB: 08-25-1960, 53 y.o.   MRN: [id number]  Met with patient and wife this morning to review team conference.  Both understand and are agreeable with targeted d/c 05/11/12 with modified independent w/c level goals.  They are hopeful that the start of plasmapheresis will prove to be beneficial.  Answered questions about potential d/c DME and follow up needs.  Both appeared pleased with current progress and deny any questions/ concerns for team or myself.  [name], LCSW

## 2012-04-29 ENCOUNTER — Inpatient Hospital Stay (HOSPITAL_COMMUNITY): Payer: BC Managed Care – PPO

## 2012-04-29 ENCOUNTER — Inpatient Hospital Stay (HOSPITAL_COMMUNITY): Payer: BC Managed Care – PPO | Admitting: Physical Therapy

## 2012-04-29 LAB — CREATININE, SERUM
GFR calc Af Amer: >90 mL/min (ref >90)
GFR calc non Af Amer: >90 mL/min (ref >90)

## 2012-04-29 LAB — COMPREHENSIVE METABOLIC PANEL
ALT: 37 U/L (ref 0–53)
Alkaline Phosphatase: 50 U/L (ref 39–117)
BUN: 16 mg/dL (ref 6–23)
CO2: 24 mEq/L (ref 19–32)
Chloride: 103 mEq/L (ref 96–112)
GFR calc Af Amer: >90 mL/min (ref >90)
Glucose, Bld: 107 mg/dL — ABNORMAL HIGH (ref 70–99)
Potassium: 4 mEq/L (ref 3.5–5.1)
Sodium: 135 mEq/L (ref 135–145)
Total Bilirubin: 0.7 mg/dL (ref 0.3–1.2)
Total Protein: 5.5 g/dL — ABNORMAL LOW (ref 6.0–8.3)

## 2012-04-29 LAB — CBC
HCT: 36.5 % — ABNORMAL LOW (ref 39.0–52.0)
Hemoglobin: 12.2 g/dL — ABNORMAL LOW (ref 13.0–17.0)
MCHC: 33.4 g/dL (ref 30.0–36.0)
RBC: 4.21 MIL/uL — ABNORMAL LOW (ref 4.22–5.81)
WBC: 7.8 10*3/uL (ref 4.0–10.5)

## 2012-04-29 MED ORDER — HYDROCODONE-ACETAMINOPHEN 5-325 MG PO TABS
ORAL_TABLET | ORAL | Status: AC
  Administered 2012-04-29: 2 via ORAL
  Filled 2012-04-29: qty 2

## 2012-04-29 MED ORDER — ACETAMINOPHEN 325 MG PO TABS: 650.0000 mg | ORAL_TABLET | ORAL | Status: DC | PRN

## 2012-04-29 MED ORDER — DIPHENHYDRAMINE HCL 25 MG PO CAPS: 25.0000 mg | ORAL_CAPSULE | Freq: Four times a day (QID) | ORAL | Status: DC | PRN

## 2012-04-29 MED ORDER — ACD FORMULA A 0.73-2.45-2.2 GM/100ML VI SOLN
500.0000 mL | Status: DC
  Administered 2012-04-29 (×2): 500 mL via INTRAVENOUS
  Filled 2012-04-29: qty 500

## 2012-04-29 MED ORDER — SODIUM CHLORIDE 0.9 % IV SOLN
INTRAVENOUS | Status: AC
  Administered 2012-04-29 (×4): via INTRAVENOUS_CENTRAL
  Filled 2012-04-29 (×4): qty 200

## 2012-04-29 MED ORDER — ACD FORMULA A 0.73-2.45-2.2 GM/100ML VI SOLN
Status: AC
  Administered 2012-04-29: 500 mL via INTRAVENOUS
  Filled 2012-04-29: qty 500

## 2012-04-29 MED ORDER — HEPARIN SODIUM (PORCINE) 1000 UNIT/ML IJ SOLN
1000.0000 [IU] | Freq: Once | INTRAMUSCULAR | Status: DC
  Filled 2012-04-29: qty 1

## 2012-04-29 MED ORDER — CALCIUM GLUCONATE 10 % IV SOLN
2.0000 g | INTRAVENOUS | Status: DC | PRN
  Filled 2012-04-29: qty 20

## 2012-04-29 MED ORDER — CALCIUM CARBONATE ANTACID 500 MG PO CHEW
2.0000 | CHEWABLE_TABLET | ORAL | Status: DC | PRN
  Filled 2012-04-29: qty 2

## 2012-04-29 MED ORDER — SODIUM CHLORIDE 0.9 % IV SOLN
4.0000 g | INTRAVENOUS | Status: DC | PRN
  Administered 2012-04-29: 4 g via INTRAVENOUS
  Filled 2012-04-29: qty 40

## 2012-04-29 MED ORDER — CALCIUM CARBONATE ANTACID 500 MG PO CHEW
2.0000 | CHEWABLE_TABLET | ORAL | Status: DC
  Filled 2012-04-29 (×2): qty 2

## 2012-04-29 MED ORDER — SODIUM CHLORIDE 0.9 % IV SOLN
2.0000 g | INTRAVENOUS | Status: DC | PRN
  Filled 2012-04-29: qty 20

## 2012-04-29 NOTE — Progress Notes (Signed)
Subjective/Complaints: Was tired yesterday, but able to make it through therapies. Slept well last night  A 12 point review of systems has been performed and if not noted above is otherwise negative.   Objective: Vital Signs: Blood pressure 100/44, pulse 66, temperature 98.5 F (36.9 C), temperature source Oral, resp. rate 20, height 6\' 5"  (1.956 m), weight 133.3 kg (293 lb 14 oz), SpO2 93.00%. [name] Contrast  04/28/2012  *RADIOLOGY REPORT*  Clinical Data: History of transverse myelitis.  MRI HEAD WITHOUT AND WITH CONTRAST  Technique:  Multiplanar, multiecho pulse sequences of the brain and surrounding structures were obtained according to standard protocol without and with intravenous contrast  Contrast: 20mL MULTIHANCE GADOBENATE DIMEGLUMINE 529 MG/ML IV SOLN  Comparison: No comparison head CT or brain MR.  Comparison cervical spine and thoracic spine MR 04/16/2012.  Findings: No acute infarct.  No intracranial hemorrhage.  Tiny T2 hyperintensity left frontal lobe without definitive findings of intracranial demyelinating process.  Right periatrial developmental venous anomaly otherwise no intracranial mass or abnormal enhancement noted.  Exophthalmos.  Small vertebral arteries and basilar artery.  Major intracranial vascular structures are patent.  Cerebellar tonsils minimally low-lying but within the range of normal limits.  Minimal partial opacification/mucosal thickening ethmoid sinus air cells.  IMPRESSION: No definitive findings of demyelinating process.  There is a single punctate left frontal T2 hyperintensity.  Right periatrial developmental venous anomaly otherwise no intracranial mass or abnormal enhancement.  Exophthalmos.   Original Report Authenticated By: Matthew Juarez, M.D.    Ir Fluoro Guide Cv Line Right  04/29/2012  *RADIOLOGY REPORT*  Clinical data: Transverse myelitis, needs access for pheresis.  TUNNELED PHERESIS CATHETER PLACEMENT WITH ULTRASOUND AND FLUOROSCOPIC GUIDANCE:   Comparison: None  Technique: The procedure, risks, benefits, and alternatives were explained to the patient.  Questions regarding the procedure were encouraged and answered.  The patient understands and consents to the procedure.  As antibiotic prophylaxis, cefazolin 2 grams was ordered pre- procedure and administered intravenously within one hour of incision.  Patency of the right IJ vein was confirmed with ultrasound with image documentation. An appropriate skin site was determined. Region was prepped using maximum barrier technique including cap and mask, sterile gown, sterile gloves, large sterile sheet, and Chlorhexidine   as cutaneous antisepsis. The region was infiltrated locally with 1% lidocaine.  Intravenous Fentanyl and Versed were administered as conscious sedation during continuous cardiorespiratory monitoring by the radiology RN, with a total moderate sedation time of less than 30 minutes.  Under real-time ultrasound guidance, the right IJ vein was accessed with a 21 gauge micropuncture needle; the needle tip within the vein was confirmed with ultrasound image documentation.   Needle exchanged over the 018 guidewire for transitional dilator, which allowed advancement of a Benson wire into the IVC. Over this, an MPA catheter was advanced. A Hemosplit 19 catheter was tunneled from the right anterior chest wall approach to the right IJ dermatotomy site. The MPA catheter was exchanged over an Amplatz wire for serial vascular dilators which allow placement of a peel- away sheath, through which the catheter was advanced under intermittent fluoroscopy, positioned with its tips in the proximal and midright atrium. Spot chest radiograph confirms good catheter position. No pneumothorax. Catheter was flushed and primed per protocol. Catheter secured externally with O Prolene sutures. The right IJ   dermatotomy site was closed with  Dermabond. No immediate complication.  IMPRESSION:  1. Technically successful  placement of tunneled right IJ pheresis catheter with ultrasound and  fluoroscopic guidance. Ready for routine use.   Original Report Authenticated By: D. Andria Rhein, MD    Ir US Guide Vasc Access Right  04/29/2012  *RADIOLOGY REPORT*  Clinical data: Transverse myelitis, needs access for pheresis.  TUNNELED PHERESIS CATHETER PLACEMENT WITH ULTRASOUND AND FLUOROSCOPIC GUIDANCE:  Comparison: None  Technique: The procedure, risks, benefits, and alternatives were explained to the patient.  Questions regarding the procedure were encouraged and answered.  The patient understands and consents to the procedure.  As antibiotic prophylaxis, cefazolin 2 grams was ordered pre- procedure and administered intravenously within one hour of incision.  Patency of the right IJ vein was confirmed with ultrasound with image documentation. An appropriate skin site was determined. Region was prepped using maximum barrier technique including cap and mask, sterile gown, sterile gloves, large sterile sheet, and Chlorhexidine   as cutaneous antisepsis. The region was infiltrated locally with 1% lidocaine.  Intravenous Fentanyl and Versed were administered as conscious sedation during continuous cardiorespiratory monitoring by the radiology RN, with a total moderate sedation time of less than 30 minutes.  Under real-time ultrasound guidance, the right IJ vein was accessed with a 21 gauge micropuncture needle; the needle tip within the vein was confirmed with ultrasound image documentation.   Needle exchanged over the 018 guidewire for transitional dilator, which allowed advancement of a Benson wire into the IVC. Over this, an MPA catheter was advanced. A Hemosplit 19 catheter was tunneled from the right anterior chest wall approach to the right IJ dermatotomy site. The MPA catheter was exchanged over an Amplatz wire for serial vascular dilators which allow placement of a peel- away sheath, through which the catheter was advanced under  intermittent fluoroscopy, positioned with its tips in the proximal and midright atrium. Spot chest radiograph confirms good catheter position. No pneumothorax. Catheter was flushed and primed per protocol. Catheter secured externally with O Prolene sutures. The right IJ   dermatotomy site was closed with  Dermabond. No immediate complication.  IMPRESSION:  1. Technically successful placement of tunneled right IJ pheresis catheter with ultrasound and fluoroscopic guidance. Ready for routine use.   Original Report Authenticated By: D. Andria Rhein, MD    Dg Chest Port 1 View  04/28/2012  *RADIOLOGY REPORT*  Clinical Data: Hemodialysis catheter placement  PORTABLE CHEST - 1 VIEW  Comparison: 1.17.14  Findings: Right IJ approach large bore catheter tips project over the proximal and mid SVC.  Hypoaeration.  Elevated left hemidiaphragm.  No confluent airspace opacity.  No pleural effusion or pneumothorax.  Cardiomediastinal contours unchanged.  No acute osseous finding.  IMPRESSION: Right IJ approach large bore catheter tips project over the SVC. Otherwise, no acute process identified.   Original Report Authenticated By: Jearld Lesch, M.D.     Basename 04/29/12 0545 04/27/12 0525  WBC 7.8 7.0  HGB 12.2* 12.1*  HCT 36.5* 35.6*  PLT 173 164    Basename 04/29/12 0545  NA --  K --  CL --  GLUCOSE --  BUN --  CREATININE 0.87  CALCIUM --   CBG (last 3)  No results found for this basename: GLUCAP:3 in the last 72 hours  Wt Readings from Last 3 Encounters:  04/28/12 133.3 kg (293 lb 14 oz)  04/17/12 133.6 kg (294 lb 8.6 oz)  12/05/11 139.708 kg (308 lb)    Physical Exam:  Constitutional: Matthew Juarez is oriented to person, place, and time. Matthew Juarez appears well-developed.  HENT:  Head: Normocephalic.  Eyes:  Pupils round and reactive to light  Neck: Normal range of motion. Neck supple. No thyromegaly present.  Cardiovascular: Normal rate and regular rhythm. No murmurs  Pulmonary/Chest: Effort normal and  breath sounds normal. No respiratory distress.  Abdominal: Soft. Bowel sounds are normal. Matthew Juarez exhibits no distension. There is no tenderness.  Musculoskeletal:  Right ankle in splint  Neurological: Matthew Juarez is alert and oriented to person, place, and time.  Follows full commands. T8 sensory level. Trace to absent sensation on the left below level, slightly diminished sense to PP/LT on right.(does have paresthesias) RLE remains 1 prox to tr-1 distally at the ankle. LLE is 3+ to 4/5 proximal to distal. UE's are 5/5. Cognitively intact. DTR's are 2+. Cast in place  Skin: thigh breakdown areas covered with allevyn which needs to be replaced Psychiatric: Matthew Juarez has a normal mood and affect. His behavior is normal. Judgment and thought content normal       Assessment/Plan: 1. Functional deficits secondary to cervico-thoracic transverse myelitis (clinically T8 level) with Brown-Sequard presentation which require 3+ hours per day of interdisciplinary therapy in a comprehensive inpatient rehab setting. Physiatrist is providing close team supervision and 24 hour management of active medical problems listed below. Physiatrist and rehab team continue to assess barriers to discharge/monitor patient progress toward functional and medical goals.   FIM: FIM - Bathing Bathing Steps Patient Completed: Chest;Right Arm;Left Arm;Abdomen;Front perineal area;Right upper leg;Left upper leg;Buttocks;Left lower leg (including foot) Bathing: 5: Set-up assist to: Obtain items  FIM - Upper Body Dressing/Undressing Upper body dressing/undressing steps patient completed: Thread/unthread right sleeve of pullover shirt/dresss;Thread/unthread left sleeve of pullover shirt/dress;Put head through opening of pull over shirt/dress;Pull shirt over trunk Upper body dressing/undressing: 5: Set-up assist to: Obtain clothing/put away FIM - Lower Body Dressing/Undressing Lower body dressing/undressing steps patient completed: Thread/unthread  right pants leg;Thread/unthread left pants leg;Pull pants up/down;Don/Doff left shoe;Fasten/unfasten left shoe Lower body dressing/undressing: 5: Set-up assist to: Don/Doff AFO/prosthesis/orthosis (4/4 appropriate tasks)  FIM - Toileting Toileting steps completed by patient: Adjust clothing prior to toileting;Performs perineal hygiene;Adjust clothing after toileting Toileting: 0: Activity did not occur  FIM - Diplomatic Services operational officer Devices: Bedside commode;Sliding board (Drop Arm BSC) Toilet Transfers: 0-Activity did not occur  FIM - Banker Devices: Bed rails;HOB elevated Bed/Chair Transfer: 5: Supine > Sit: Supervision (verbal cues/safety issues);5: Bed > Chair or W/C: Supervision (verbal cues/safety issues)  FIM - Locomotion: Wheelchair Distance: 150 Locomotion: Wheelchair: 5: Travels 150 ft or more: maneuvers on rugs and over door sills with supervision, cueing or coaxing FIM - Locomotion: Ambulation Ambulation/Gait Assistance: Not tested (comment) Locomotion: Ambulation: 0: Activity did not occur  Comprehension Comprehension Mode: Auditory Comprehension: 7-Follows complex conversation/direction: With no assist  Expression Expression Mode: Verbal Expression: 7-Expresses complex ideas: With no assist  Social Interaction Social Interaction: 7-Interacts appropriately with others - No medications needed.  Problem Solving Problem Solving: 6-Solves complex problems: With extra time  Memory Memory: 6-More than reasonable amt of time  Medical Problem List and Plan:  1. cervico-thoracic transverse myelitis, T8 with Brown-Squard clinical presentation.   -appreciate Neurology follow up  -plasmapheresis 2/5 tonight?   -MRI essentially unremarkable 2. Right distal fibular fracture. Patient is nonweightbearing. In cast. xr still shows distracted fragments- keep NWB pending ortho follow up 3. DVT  Prophylaxis/Anticoagulation: right popliteal DVT. Increased to mg/kg lovenox for 2wks then recheck dopplers. 4. Pain Management: Hydrocodone as needed. Monitor with increased activity   -increased neurontin to 300 tid which has helped dysesthesias 5. Neuropsych: This patient is capable  of making decisions on his/her own behalf.  6. Hypertension. Norvasc 5 mg daily, Avapro 150 mg daily. Monitor with increased activity. Normotensive at present  7. History of gout. Allopurinol daily. Monitor for any flareups  8. Prerenal azotemia: pushing liquids. Follow up labs pending 9.   allevyn patch over thigh wound with EPBC over thighs- keep legs separated as possible. Towel would be helpful 10. Neurogenic bowel: will work on am bowel program with nursing LOS (Days) 9 A FACE TO FACE EVALUATION WAS PERFORMED  Matthew Juarez,Matthew Juarez 04/29/2012 8:20 AM

## 2012-04-29 NOTE — Plan of Care (Signed)
Problem: RH PAIN MANAGEMENT Goal: RH STG PAIN MANAGED AT OR BELOW PT'S PAIN GOAL 2/10  Outcome: Progressing No c/o pain     

## 2012-04-29 NOTE — Plan of Care (Signed)
Problem: RH SAFETY Goal: RH STG ADHERE TO SAFETY PRECAUTIONS W/ASSISTANCE/DEVICE STG Adhere to Safety Precautions With Assistance/Device. Supervision Outcome: Progressing Calls appropriately for assistance     

## 2012-04-29 NOTE — Progress Notes (Signed)
Physical Therapy Session Note  Patient Details  Name: Matthew Juarez MRN: [id number] Date of Birth: [birth year]  Today's Date: 04/29/2012 Time:Session #1: 4098-1191, Session #2:  [id number] Time Calculation (min): Session #1: 65 min, Session #2: 39 min  Short Term Goals: Week 2:  PT Short Term Goal 1 (Week 2): Bed mobility both supine to sit and sit to supine mod I using whatever assitive device needed to maneuver right leg into and out of bed.   PT Short Term Goal 2 (Week 2): Squat pivot transfers to both left and right min assist to elevated surface to the left and lower surface to the right to simulate bed transfer at home.   PT Short Term Goal 3 (Week 2): Car transfer with slide board supervision PT Short Term Goal 4 (Week 2): WC mobility >150' with mod I assist needed only for parts management( leg rests and armrest).   PT Short Term Goal 5 (Week 2): Standing at mat table with RW x 2 mins mod assist to stabilize RW from elevated surface while maintaining NWB right foot.  Skilled Therapeutic Interventions/Progress Updates:    Session #1: This session focused on WC mobility 150' x 2 mod I to and from the gym with assist needed with leg rests, positioning of right leg on leg rest and armrest (armrest due to type of armrest).  Squat/scoot pivot transfer to and from level mat table min assist to stabilize WC from moving during transfer.  Cues for checking foot placement.  Transfer perfomred to both left and right with only help to stabilize the WC.  Sit to supine on mat table min assist of right leg only.  Pt attempted to lift leg using arms on his own, needed min assist for sitting balance while he attempted this maneuver unsuccessfully.  Supine to sit supervision.  We then went into the [place] apartment and practiced transfers on a "real" bed due to different pushing on a compliant surface vs hard mat table.  Min assist for level bed transfer to stabilize the WC to keep it from moving out from under him  while he pushed, min assist to help manage right thigh to get back into the WC low to higher transfer towards the right.  Elevated the height of the bed with raisers and pt was min assist to stabilize WC to go to both left and right during transfer to sitting on higher bed.  Encouraged pt to set up his transfers at home to get into the bed going towards his left side.  He stated that he thought this was possible to set up.    Session #2: This session focused on WC mobility to and from the gym >150' x 2 over tiled and carpeted surfaces (pt has mostly carpet at home and this provides increased resistance to pushing the WC).  Pt at mod I level in [facility] with help only to manage parts (leg rests and arm rests).  Transfer to Nu step machine min assist to stabilize WC to go onto the NuStep seat.  Mod assist to help manage right thigh to get back into WC after 8 mins level 4 for endurance, coordination and strength training.  Mod assist was due to the fact that seat cushion in [facility] was higher than level of right thigh and lift needed for him to clear the thigh over the edge of the cushion during the transfer.  Pt had to take a rest break every minute while on the NuStep due  to fatigue.  HR ranged from 89-119 bpm during exercise, O2 sats >95% on RA.  He used bil arms and right leg which had to be strapped both toe and heel to keep foot on the machine.   Therapy Documentation Precautions:  Precautions Precautions: Fall Precaution Comments: R leg weakness, but + sensation, left leg lack of coordination and no sensation. Required Braces or Orthoses: Other Brace/Splint Other Brace/Splint: R foot casted Restrictions Weight Bearing Restrictions: Yes RLE Weight Bearing: Non weight bearing LLE Weight Bearing: Weight bearing as tolerated General:   Vital Signs: Therapy Vitals Temp: 98.3 F (36.8 C) Temp src: Oral Pulse Rate: 80  Resp: 18  BP: 109/53 mmHg Patient Position, if appropriate: Lying Oxygen  Therapy SpO2: 100 % O2 Device: None (Room air) Pain: Pain Assessment Pain Assessment: No/denies pain Pain Score: 0-No pain   Locomotion : Wheelchair Mobility Distance: 200   See FIM for current functional status  Therapy/Group: Individual Therapy  Lurena Joiner B. Malajah Oceguera, PT, DPT [phone]   04/29/2012, 4:46 PM

## 2012-04-29 NOTE — Significant Event (Signed)
RLE with cast intact. +CS, negative M with pt being unable to move toes. Harvel Ricks, PA made aware. No new orders at this time.

## 2012-04-29 NOTE — Progress Notes (Signed)
Occupational Therapy Weekly Progress Note  Patient Details  Name: Matthew Juarez MRN: [id number] Date of Birth: 1960-09-18  Today's Date: 04/29/2012  Patient has met 4 of 4 short term goals.  Pt has made steady gains this past week with bathing and dressing tasks.  Pt is supervision/set-up for bathing and dressing at bed level and seated EOB using AE PRN for LB dressing.  Pt is min A/supervision for drop arm BSC transfers with level surface.  Pt continues to require assistance with w/c set up prior to transfers from/to w/c.  Pt's wife has been present for multiple therapy sessions. Patient continues to demonstrate the following deficits:  Decreased dynamic sitting balance, Decreased sit to stand, very limited standing ability secondary to NWB on RLE, decreased activity tolerance, NWB on RLE, decreased generalized strength in UE (pt now needs to be able to use UE's for mobility and therefore needs advanced UE strength), plegia of RLE, altered sensation in LLE, decreased patient and family education  and therefore will continue to benefit from skilled OT intervention to enhance overall performance with BADL.  Patient progressing toward long term goals..    OT Short Term Goals Week 1:  OT Short Term Goal 1 (Week 1): Patient will donn pants at bed level with min assist using AE prn OT Short Term Goal 1 - Progress (Week 1): Met OT Short Term Goal 2 (Week 1): Patient will engage in bed mobility with min assist in order to donn shirt sitting edge of bed OT Short Term Goal 2 - Progress (Week 1): Met OT Short Term Goal 3 (Week 1): Toilet transfer <-> [facility] will be attempted with patient using AE prn OT Short Term Goal 3 - Progress (Week 1): Met OT Short Term Goal 4 (Week 1): Patient will complete grooming tasks seated in w/c at sink with set-up assist OT Short Term Goal 4 - Progress (Week 1): Met Week 2:  OT Short Term Goal 1 (Week 2): Pt will perform Drop Arm BSC transfers to uneven surface with  supervision OT Short Term Goal 2 (Week 2): Pt will perform toileting tasks with min A OT Short Term Goal 3 (Week 2): Pt will perform tub bench transfer with supervision  Skilled Therapeutic Interventions/Progress Updates:  Please see care plan for plan of care and daily progress notes for skilled intervention and progress/updates.    Therapy Documentation Precautions:  Precautions Precautions: Fall Precaution Comments: loss of sensation or proprioception (LLE worse than RLE) with loss of strength R>L (trace to no strength in right leg all the way up to hip) Required Braces or Orthoses: Other Brace/Splint Other Brace/Splint: hard splint -> RLE Restrictions Weight Bearing Restrictions: Yes RLE Weight Bearing: Non weight bearing LLE Weight Bearing: Weight bearing as tolerated Pain: Pain Assessment Pain Assessment: No/denies pain Pain Score: 0-No pain  See FIM for current functional status  Therapy/Group: Individual Therapy  Rich Brave 04/29/2012, 12:03 PM

## 2012-04-29 NOTE — Progress Notes (Signed)
Social Work Patient ID: Matthew Juarez, male   DOB: 1960-11-24, 52 y.o.   MRN: [id number]  Approval from [employer] Cl[employer], [employer] thru 05/04/12. Ref# [id number], updates due 2/4.   Librada Castronovo, LCSW

## 2012-04-29 NOTE — Progress Notes (Signed)
Occupational Therapy Session Note  Patient Details  Name: Matthew Juarez MRN: [id number] Date of Birth: 1960/10/21  Today's Date: 04/29/2012  Session 1 Time: 0700-0758 Time Calculation (min): 58 min  Short Term Goals: Week 1:  OT Short Term Goal 1 (Week 1): Patient will donn pants at bed level with min assist using AE prn OT Short Term Goal 2 (Week 1): Patient will engage in bed mobility with min assist in order to donn shirt sitting edge of bed OT Short Term Goal 3 (Week 1): Toilet transfer <-> [facility] will be attempted with patient using AE prn OT Short Term Goal 4 (Week 1): Patient will complete grooming tasks seated in w/c at sink with set-up assist  Skilled Therapeutic Interventions/Progress Updates:    Pt completed bathing and dressing tasks EOB and seated EOB before transferring to w/c (squat pivot) to complete grooming tasks.  Focus on bed mobility, LB bathing and dressing using AE PRN, squat pivot transfers, RLE management/positioning, and LLE awareness.  Pt continues to require verbal cues to attend to LLE prior to and during transfers.  Therapy Documentation Precautions:  Precautions Precautions: Fall Precaution Comments: loss of sensation or proprioception (LLE worse than RLE) with loss of strength R>L (trace to no strength in right leg all the way up to hip) Required Braces or Orthoses: Other Brace/Splint Other Brace/Splint: hard splint -> RLE Restrictions Weight Bearing Restrictions: Yes RLE Weight Bearing: Non weight bearing LLE Weight Bearing: Weight bearing as tolerated  See FIM for current functional status  Therapy/Group: Individual Therapy  Session 2 Time: 1100-1130 Pt denies pain Individual therapy  Pt's wife present for therapy session.  Pt practiced tub bench transfers X 3 with squat pivot.  Pt completed tasks with steady A and assistance to steady w/c during transfer.  Pt required verbal cues for safety and setup prior to transfers.  Discussed with wife to  obtain bathroom door measurements at home to determine w/c accessibility.  Lavone Neri [facility] 04/29/2012, 7:59 AM

## 2012-04-30 ENCOUNTER — Inpatient Hospital Stay (HOSPITAL_COMMUNITY): Payer: BC Managed Care – PPO

## 2012-04-30 ENCOUNTER — Inpatient Hospital Stay (HOSPITAL_COMMUNITY): Payer: BC Managed Care – PPO | Admitting: Physical Therapy

## 2012-04-30 DIAGNOSIS — W19XXXA Unspecified fall, initial encounter: Secondary | ICD-10-CM

## 2012-04-30 DIAGNOSIS — S8263XA Displaced fracture of lateral malleolus of unspecified fibula, initial encounter for closed fracture: Secondary | ICD-10-CM

## 2012-04-30 DIAGNOSIS — G373 Acute transverse myelitis in demyelinating disease of central nervous system: Secondary | ICD-10-CM

## 2012-04-30 DIAGNOSIS — I803 Phlebitis and thrombophlebitis of lower extremities, unspecified: Secondary | ICD-10-CM

## 2012-04-30 LAB — POCT I-STAT, CHEM 8
BUN: 16 mg/dL (ref 6–23)
Calcium, Ion: 1.29 mmol/L — ABNORMAL HIGH (ref 1.12–1.23)
Creatinine, Ser: 1 mg/dL (ref 0.50–1.35)
Hemoglobin: 12.6 g/dL — ABNORMAL LOW (ref 13.0–17.0)
TCO2: 24 mmol/L (ref 0–100)

## 2012-04-30 NOTE — Progress Notes (Signed)
Physical Therapy Session Note  Patient Details  Name: Matthew Juarez MRN: [id number] Date of Birth: 1961-02-05  Today's Date: 04/30/2012 Time:Session #1: 4098-1191, Session #2: [id number]  Time Calculation (min): Session #1: 56 min, Session #2:   Short Term Goals: Week 2:  PT Short Term Goal 1 (Week 2): Bed mobility both supine to sit and sit to supine mod I using whatever assitive device needed to maneuver right leg into and out of bed.   PT Short Term Goal 2 (Week 2): Squat pivot transfers to both left and right min assist to elevated surface to the left and lower surface to the right to simulate bed transfer at home.   PT Short Term Goal 3 (Week 2): Car transfer with slide board supervision PT Short Term Goal 4 (Week 2): WC mobility >150' with mod I assist needed only for parts management( leg rests and armrest).   PT Short Term Goal 5 (Week 2): Standing at mat table with RW x 2 mins mod assist to stabilize RW from elevated surface while maintaining NWB right foot.  Skilled Therapeutic Interventions/Progress Updates:    Session #1: This session focused on WC mobility from room to far ortho gym x2 >200' with mod I.  Needs the occasional rest break due to fatigue x 2 < 1 minute. Car transfer from [facility] to slightly lower 1-2" car seat using slide board min assist to get to car, mod assist to get out of car using slide board.  Also tried squat pivot transfer into and out of the car.  Min assist to stabilize WC into car, mod assist to help lift right leg and boost trunk up into WC from lower car surface.  As the person assisting him it was more difficult for me to help him with the squat pivot transfer out than with the slide board transfer, however, pt has a lot of difficulty with board placement without assist to get out of the car.  I suggested depending on how high his actual car seat ends up being that maybe he could squat pivot into the car and slide board out of the car. We will continue to practice  both techniques and I will ask his wife to measure the height of his car seat in the back to get an idea of if we are going up or down to get into/out of the car.    Session #2: This session focused on WC mobility to and from the gym 150' x2 with mod I over tiled flooring.  Wife helping pt manage leg rests and arm rests.  Transfer WC to elevated mat table to simulate bed transfer min assist to stabilize the WC.  Sit to stand and standing tolerance practice at high mat table 2 person assist one to stabilize the RW and one to stabilize the pt's left leg in standing x 7 reps.  Max stand time ~45 sec-1 min.  Working on controlling descent to sit.  Mat table (elevated) to [facility] scoot transfer min assist to stabilize WC from moving.  Practiced transfer in ADL apartment to high bed (compliant mattress vs hard mat table) only assist needed to stabilize WC min assist during transitions.  Pt able to position his WC on his own in tight space in ADL apartment on carpeted surface.  Wife wanted to know when she needed to be here for "formal" family education.  She said it can be any time.  D/C date is the 11th (tues) of feb. So,  family ed on Monday (10th) or the previous Friday (7th)?  I would prefer not to burden the weekend therapist with family ed if possible.     Therapy Documentation Precautions:  Precautions Precautions: Fall Precaution Comments: R leg weakness, but + sensation, left leg lack of coordination and no sensation. Required Braces or Orthoses: Other Brace/Splint Other Brace/Splint: R foot casted Restrictions Weight Bearing Restrictions: Yes RLE Weight Bearing: Non weight bearing LLE Weight Bearing: Weight bearing as tolerated General:   Vital Signs: Therapy Vitals BP: 110/70 mmHg Pain: Pain Assessment Pain Assessment: 0-10 Pain Score:   4 Pain Type: Acute pain Pain Location: Ankle Pain Orientation: Right Pain Intervention(s): Medication (See eMAR) Mobility:   Locomotion : Wheelchair  Mobility Distance: 200   See FIM for current functional status  Therapy/Group: Individual Therapy  Lurena Joiner B. Cheyann Blecha, PT, DPT [phone]   04/30/2012, 10:24 AM

## 2012-04-30 NOTE — Progress Notes (Signed)
NEURO HOSPITALIST PROGRESS NOTE   SUBJECTIVE:                                                                                                                         No complaints or SE from PLEX. In good spirits.  OBJECTIVE:                                                                                                                           Vital signs in last 24 hours: Temp:  [97.6 F (36.4 C)-99.1 F (37.3 C)] 98 F (36.7 C) (01/31 1300) Pulse Rate:  [60-87] 87  (01/31 1455) Resp:  [17-23] 18  (01/31 1300) BP: (89-121)/(47-70) 90/47 mmHg (01/31 1455) SpO2:  [97 %-100 %] 98 % (01/31 1300)  Intake/Output from previous day: 01/30 0701 - 01/31 0700 In: 960 [P.O.:960] Out: 950 [Urine:950] Intake/Output this shift: Total I/O In: 480 [P.O.:480] Out: -  Nutritional status: Cardiac  Past Medical History  Diagnosis Date  . Chronic back pain   . Hypertension   . Gout   . Headache   . Arthritis      Neurologic Exam:  Mental Status: Alert, oriented, thought content appropriate.  Speech fluent without evidence of aphasia.  Able to follow 3 step commands without difficulty. Cranial Nerves: II: Discs flat bilaterally; Visual fields grossly normal, pupils equal, round, reactive to light and accommodation III,IV, VI: ptosis not present, extra-ocular motions intact bilaterally V,VII: smile symmetric, facial light touch sensation normal bilaterally VIII: hearing normal bilaterally IX,X: gag reflex present XI: bilateral shoulder shrug XII: midline tongue extension Motor: Right : Upper extremity   5/5    Left:     Upper extremity   5/5 Right hip and knee flexion 3/5 (right <left).   right distal leg in cast.  Tone and bulk:normal tone throughout; no atrophy noted Sensory: Decreased left leg and right leg above cast.  Deep Tendon Reflexes: 2+ and symmetric throughout UE.     Lab Results: No results found for this basename: cbc, bmp,  coags, chol, tri, ldl, hga1c   Lipid Panel No results found for this basename: CHOL,TRIG,HDL,CHOLHDL,VLDL,LDLCALC in the last 72 hours  Studies/Results: No results found.  MEDICATIONS  Scheduled:   . allopurinol  300 mg Oral Daily  . amLODipine  5 mg Oral Daily   And  . irbesartan  150 mg Oral Daily  . bisacodyl  10 mg Rectal Q0600  . calcium gluconate  2 g Intravenous Once  . enoxaparin (LOVENOX) injection  1 mg/kg Subcutaneous Q12H  . gabapentin  300 mg Oral TID  . pantoprazole  40 mg Oral Q1200  . senna-docusate  2 tablet Oral QHS  . sodium phosphate  1 enema Rectal Once    ASSESSMENT/PLAN:                                                                                                               Patient Active Hospital Problem List: Transverse myelitis (04/21/2012)   Assessment: Awaiting PLEX #3 tomorrow. Continues to show slow improvement. No SE on PLEX.    Plan: Continue PT/OT and continue PLX.    Assessment and plan discussed with with attending physician and they are in agreement.    Felicie Morn PA-C [facility] [phone]  04/30/2012, 2:58 PM    Patient seen and examined together with physician assistant and I concur with the assessment and plan.  Wyatt Portela, MD

## 2012-04-30 NOTE — Progress Notes (Signed)
Subjective/Complaints: Pheresis much earlier last night. No problems. No neuro changes  A 12 point review of systems has been performed and if not noted above is otherwise negative.   Objective: Vital Signs: Blood pressure 98/54, pulse 64, temperature 98.5 F (36.9 C), temperature source Oral, resp. rate 18, height 6\' 5"  (1.956 m), weight 133.3 kg (293 lb 14 oz), SpO2 100.00%. No results found.  Basename 04/29/12 0545  WBC 7.8  HGB 12.2*  HCT 36.5*  PLT 173    Basename 04/29/12 1919 04/29/12 0545  NA 135 --  K 4.0 --  CL 103 --  GLUCOSE 107* --  BUN 16 --  CREATININE 0.90 0.87  CALCIUM 8.9 --   CBG (last 3)  No results found for this basename: GLUCAP:3 in the last 72 hours  Wt Readings from Last 3 Encounters:  04/28/12 133.3 kg (293 lb 14 oz)  04/17/12 133.6 kg (294 lb 8.6 oz)  12/05/11 139.708 kg (308 lb)    Physical Exam:  Constitutional: He is oriented to person, place, and time. He appears well-developed.  HENT:  Head: Normocephalic.  Eyes:  Pupils round and reactive to light  Neck: Normal range of motion. Neck supple. No thyromegaly present.  Cardiovascular: Normal rate and regular rhythm. No murmurs  Pulmonary/Chest: Effort normal and breath sounds normal. No respiratory distress.  Abdominal: Soft. Bowel sounds are normal. He exhibits no distension. There is no tenderness.  Musculoskeletal:  Right ankle in splint  Neurological: He is alert and oriented to person, place, and time.  Follows full commands. T8 sensory level. Trace to absent sensation on the left below level, slightly diminished sense to PP/LT on right.(does have paresthesias) RLE remains 1 prox to tr-1 distally at the ankle. LLE is 3+ to 4/5 proximal to distal. UE's are 5/5. Cognitively intact. DTR's are 2+. Cast in place  Skin: thigh breakdown areas healing. No new areas Psychiatric: He has a normal mood and affect. His behavior is normal. Judgment and thought content normal        Assessment/Plan: 1. Functional deficits secondary to cervico-thoracic transverse myelitis (clinically T8 level) with Brown-Sequard presentation which require 3+ hours per day of interdisciplinary therapy in a comprehensive inpatient rehab setting. Physiatrist is providing close team supervision and 24 hour management of active medical problems listed below. Physiatrist and rehab team continue to assess barriers to discharge/monitor patient progress toward functional and medical goals.   FIM: FIM - Bathing Bathing Steps Patient Completed: Chest;Right Arm;Left Arm;Abdomen;Front perineal area;Right upper leg;Left upper leg;Buttocks;Left lower leg (including foot) Bathing: 5: Set-up assist to: Obtain items  FIM - Upper Body Dressing/Undressing Upper body dressing/undressing steps patient completed: Thread/unthread right sleeve of pullover shirt/dresss;Thread/unthread left sleeve of pullover shirt/dress;Put head through opening of pull over shirt/dress;Pull shirt over trunk Upper body dressing/undressing: 5: Set-up assist to: Obtain clothing/put away FIM - Lower Body Dressing/Undressing Lower body dressing/undressing steps patient completed: Thread/unthread right pants leg;Thread/unthread left pants leg;Pull pants up/down;Don/Doff left shoe;Fasten/unfasten left shoe Lower body dressing/undressing: 5: Set-up assist to: Don/Doff AFO/prosthesis/orthosis (4/4 appropriate tasks)  FIM - Toileting Toileting steps completed by patient: Adjust clothing prior to toileting;Performs perineal hygiene;Adjust clothing after toileting Toileting: 0: Activity did not occur  FIM - Diplomatic Services operational officer Devices: Bedside commode;Sliding board (Drop Arm BSC) Toilet Transfers: 0-Activity did not occur  FIM - Banker Devices: Arm rests Bed/Chair Transfer: 5: Supine > Sit: Supervision (verbal cues/safety issues);4: Sit > Supine: Min A (steadying pt. >  75%/lift 1  leg);4: Bed > Chair or W/C: Min A (steadying Pt. > 75%);4: Chair or W/C > Bed: Min A (steadying Pt. > 75%)  FIM - Locomotion: Wheelchair Distance: 200 Locomotion: Wheelchair: 6: Travels 150 ft or more, turns around, maneuvers to table, bed or toilet, negotiates 3% grade: maneuvers on rugs and over door sills independently FIM - Locomotion: Ambulation Ambulation/Gait Assistance: Not tested (comment) Locomotion: Ambulation: 0: Activity did not occur  Comprehension Comprehension Mode: Auditory Comprehension: 5-Understands complex 90% of the time/Cues < 10% of the time  Expression Expression Mode: Verbal Expression: 6-Expresses complex ideas: With extra time/assistive device  Social Interaction Social Interaction: 7-Interacts appropriately with others - No medications needed.  Problem Solving Problem Solving: 5-Solves complex 90% of the time/cues < 10% of the time  Memory Memory: 5-Recognizes or recalls 90% of the time/requires cueing < 10% of the time  Medical Problem List and Plan:  1. cervico-thoracic transverse myelitis, T8 with Brown-Squard clinical presentation.   -appreciate Neurology follow up  -plasmapheresis 2/5 last night  -MRI essentially unremarkable 2. Right distal fibular fracture. Patient is nonweightbearing. In cast. xr still shows distracted fragments- keep NWB pending ortho follow up 3. DVT Prophylaxis/Anticoagulation: right popliteal DVT. Increased to mg/kg lovenox for 2wks then recheck dopplers. 4. Pain Management: Hydrocodone as needed. Monitor with increased activity   -increased neurontin to 300 tid which has helped dysesthesias 5. Neuropsych: This patient is capable of making decisions on his/her own behalf.  6. Hypertension. Norvasc 5 mg daily, Avapro 150 mg daily. Monitor with increased activity. Normotensive at present  7. History of gout. Allopurinol daily. Monitor for any flareups  8. Prerenal azotemia: pushing liquids. Follow up labs  pending 9.  Thigh wound. Pressure relief. Bowel program. Allevyn, epbc 10. Neurogenic bowel: will work on am bowel program with nursing LOS (Days) 10 A FACE TO FACE EVALUATION WAS PERFORMED  Rielynn Trulson T 04/30/2012 6:43 AM

## 2012-04-30 NOTE — Progress Notes (Signed)
Nutrition Brief Note Patient seen today due to length of stay.  Body mass index is 34.85 kg/(m^2). Patient meets criteria for obesity grade 1 based on current BMI.   Weight has been stable since admit.  Current diet order is Heart Healthy, patient is consuming approximately 50-100% of meals at this time. Labs and medications reviewed.   No nutrition interventions warranted at this time. If nutrition issues arise, please consult RD.   Oran Rein, RD, LDN Clinical Inpatient Dietitian Pager:  [phone] Weekend and after hours pager:  [phone]

## 2012-04-30 NOTE — Progress Notes (Signed)
Occupational Therapy Session Note  Patient Details  Name: Matthew Juarez MRN: [id number] Date of Birth: Jun 13, 1960  Today's Date: 04/30/2012 Session 1 Time:  0700-0745  Pt engaged in bathing and dressing tasks at bed level and seated EOB.  Focus on bed mobility, squat pivot transfers, activity tolerance, safety awareness, and RLE positioning/management.  Pt is able to manage RLE to sit EOB but continues to exhibit difficulty with positioning after sitting EOB and when in w/c.  Pt performed squat pivot transfer with assistance only to steady w/c during transfer.  Short Term Goals: Week 2:  OT Short Term Goal 1 (Week 2): Pt will perform Drop Arm BSC transfers to uneven surface with supervision OT Short Term Goal 2 (Week 2): Pt will perform toileting tasks with min A OT Short Term Goal 3 (Week 2): Pt will perform tub bench transfer with supervision  Skilled Therapeutic Interventions/Progress Updates:      Therapy Documentation Precautions:  Precautions Precautions: Fall Precaution Comments: R leg weakness, but + sensation, left leg lack of coordination and no sensation. Required Braces or Orthoses: Other Brace/Splint Other Brace/Splint: R foot casted Restrictions Weight Bearing Restrictions: Yes RLE Weight Bearing: Non weight bearing LLE Weight Bearing: Weight bearing as tolerated Pain: Pain Assessment Pain Assessment: No/denies pain  See FIM for current functional status  Therapy/Group: Individual Therapy  Session 2 Time: 1400-1430 Pt denies pain Individual Therapy  Pt engaged in BUE therex on UE ergonometer (2 min @ level 2, 5 min @ level 8 and 5 min @ level 8 in opposite direction).  Pt rolled w/c around various obstacles in hallway and dayroom on the way to room.  Pt continues to require assistance with RLE management/positioning and leg rest management on w/c.  Lavone [facility] 04/30/2012, 7:43 AM

## 2012-05-01 ENCOUNTER — Inpatient Hospital Stay (HOSPITAL_COMMUNITY): Payer: BC Managed Care – PPO | Admitting: Physical Therapy

## 2012-05-01 DIAGNOSIS — S8263XA Displaced fracture of lateral malleolus of unspecified fibula, initial encounter for closed fracture: Secondary | ICD-10-CM

## 2012-05-01 DIAGNOSIS — I803 Phlebitis and thrombophlebitis of lower extremities, unspecified: Secondary | ICD-10-CM

## 2012-05-01 DIAGNOSIS — G373 Acute transverse myelitis in demyelinating disease of central nervous system: Secondary | ICD-10-CM

## 2012-05-01 DIAGNOSIS — W19XXXA Unspecified fall, initial encounter: Secondary | ICD-10-CM

## 2012-05-01 LAB — POCT I-STAT, CHEM 8
Creatinine, Ser: 0.9 mg/dL (ref 0.50–1.35)
Glucose, Bld: 98 mg/dL (ref 70–99)
HCT: 35 % — ABNORMAL LOW (ref 39.0–52.0)
Hemoglobin: 11.9 g/dL — ABNORMAL LOW (ref 13.0–17.0)
Sodium: 136 mEq/L (ref 135–145)
TCO2: 25 mmol/L (ref 0–100)

## 2012-05-01 MED ORDER — SODIUM CHLORIDE 0.9 % IV SOLN
INTRAVENOUS | Status: AC
  Administered 2012-05-01 (×5): via INTRAVENOUS_CENTRAL
  Filled 2012-05-01 (×5): qty 200

## 2012-05-01 MED ORDER — ACD FORMULA A 0.73-2.45-2.2 GM/100ML VI SOLN
Status: AC
  Administered 2012-05-01: 17:00:00
  Filled 2012-05-01: qty 500

## 2012-05-01 MED ORDER — CALCIUM GLUCONATE 10 % IV SOLN
2.0000 g | INTRAVENOUS | Status: DC | PRN
  Filled 2012-05-01: qty 20

## 2012-05-01 MED ORDER — SODIUM CHLORIDE 0.9 % IV SOLN
4.0000 g | INTRAVENOUS | Status: DC | PRN
  Administered 2012-05-01: 4 g via INTRAVENOUS
  Filled 2012-05-01 (×2): qty 40

## 2012-05-01 MED ORDER — CALCIUM CARBONATE ANTACID 500 MG PO CHEW
2.0000 | CHEWABLE_TABLET | ORAL | Status: DC | PRN
  Filled 2012-05-01: qty 2

## 2012-05-01 MED ORDER — HEPARIN SODIUM (PORCINE) 1000 UNIT/ML IJ SOLN
1000.0000 [IU] | Freq: Once | INTRAMUSCULAR | Status: AC
  Administered 2012-05-01: 1000 [IU]
  Filled 2012-05-01: qty 1

## 2012-05-01 MED ORDER — DIPHENHYDRAMINE HCL 25 MG PO CAPS: 25.0000 mg | ORAL_CAPSULE | Freq: Four times a day (QID) | ORAL | Status: DC | PRN

## 2012-05-01 MED ORDER — ACD FORMULA A 0.73-2.45-2.2 GM/100ML VI SOLN
Status: AC
  Administered 2012-05-01: 500 mL via INTRAVENOUS
  Filled 2012-05-01: qty 500

## 2012-05-01 MED ORDER — CALCIUM CARBONATE ANTACID 500 MG PO CHEW
CHEWABLE_TABLET | ORAL | Status: AC
  Administered 2012-05-01: 400 mg via ORAL
  Filled 2012-05-01: qty 1

## 2012-05-01 MED ORDER — CALCIUM CARBONATE ANTACID 500 MG PO CHEW
CHEWABLE_TABLET | ORAL | Status: AC
  Administered 2012-05-01: 400 mg via ORAL
  Filled 2012-05-01: qty 2

## 2012-05-01 MED ORDER — CALCIUM CARBONATE ANTACID 500 MG PO CHEW
2.0000 | CHEWABLE_TABLET | ORAL | Status: AC
  Administered 2012-05-01 (×2): 400 mg via ORAL
  Filled 2012-05-01 (×2): qty 2

## 2012-05-01 MED ORDER — ACETAMINOPHEN 325 MG PO TABS: 650.0000 mg | ORAL_TABLET | ORAL | Status: DC | PRN

## 2012-05-01 MED ORDER — ACD FORMULA A 0.73-2.45-2.2 GM/100ML VI SOLN
500.0000 mL | Status: DC
  Administered 2012-05-01: 500 mL via INTRAVENOUS
  Filled 2012-05-01: qty 500

## 2012-05-01 NOTE — Progress Notes (Signed)
Patient ID: Matthew Juarez, male   DOB: [birth year], 52 y.o.   MRN: [id number] Subjective/Complaints: Pheresis for today. No problems. No neuro changes  A 12 point review of systems has been performed and if not noted above is otherwise negative.   Objective: Vital Signs: Blood pressure 99/63, pulse 57, temperature 98.4 F (36.9 C), temperature source Oral, resp. rate 20, height 6\' 5"  (1.956 m), weight 133.3 kg (293 lb 14 oz), SpO2 97.00%. No results found.  Basename 04/29/12 1846 04/29/12 0545  WBC -- 7.8  HGB 12.6* 12.2*  HCT 37.0* 36.5*  PLT -- 173    Basename 04/29/12 1919 04/29/12 1846  NA 135 137  K 4.0 4.0  CL 103 103  GLUCOSE 107* 108*  BUN 16 16  CREATININE 0.90 1.00  CALCIUM 8.9 --   CBG (last 3)  No results found for this basename: GLUCAP:3 in the last 72 hours  Wt Readings from Last 3 Encounters:  04/28/12 133.3 kg (293 lb 14 oz)  04/17/12 133.6 kg (294 lb 8.6 oz)  12/05/11 139.708 kg (308 lb)    Physical Exam:  Constitutional: He is oriented to person, place, and time. He appears well-developed.  HENT:  Head: Normocephalic.  Eyes:  Pupils round and reactive to light  Neck: Normal range of motion. Neck supple. No thyromegaly present.  Cardiovascular: Normal rate and regular rhythm. No murmurs  Pulmonary/Chest: Effort normal and breath sounds normal. No respiratory distress.  Abdominal: Soft. Bowel sounds are normal. He exhibits no distension. There is no tenderness.  Musculoskeletal:  Right ankle in splint  Neurological: He is alert and oriented to person, place, and time.  Follows full commands. T8 sensory level. Trace to absent sensation on the left below level, slightly diminished sense to PP/LT on right.(does have paresthesias) RLE remains 1 prox to tr-1 distally at the ankle. LLE is 3+ to 4/5 proximal to distal. UE's are 5/5. Cognitively intact. DTR's are 2+. Cast in place  Skin: thigh breakdown areas healing. No new areas Psychiatric: He has a normal  mood and affect. His behavior is normal. Judgment and thought content normal       Assessment/Plan: 1. Functional deficits secondary to cervico-thoracic transverse myelitis (clinically T8 level) with Brown-Sequard presentation which require 3+ hours per day of interdisciplinary therapy in a comprehensive inpatient rehab setting. Physiatrist is providing close team supervision and 24 hour management of active medical problems listed below. Physiatrist and rehab team continue to assess barriers to discharge/monitor patient progress toward functional and medical goals.   FIM: FIM - Bathing Bathing Steps Patient Completed: Chest;Right Arm;Left Arm;Abdomen;Front perineal area;Right upper leg;Left upper leg;Buttocks;Left lower leg (including foot) Bathing: 5: Set-up assist to: Obtain items  FIM - Upper Body Dressing/Undressing Upper body dressing/undressing steps patient completed: Thread/unthread right sleeve of pullover shirt/dresss;Thread/unthread left sleeve of pullover shirt/dress;Put head through opening of pull over shirt/dress;Pull shirt over trunk Upper body dressing/undressing: 5: Set-up assist to: Obtain clothing/put away FIM - Lower Body Dressing/Undressing Lower body dressing/undressing steps patient completed: Thread/unthread right pants leg;Thread/unthread left pants leg;Pull pants up/down;Don/Doff left shoe;Fasten/unfasten left shoe Lower body dressing/undressing: 5: Set-up assist to: Don/Doff AFO/prosthesis/orthosis (4/4 appropriate tasks)  FIM - Toileting Toileting steps completed by patient: Adjust clothing prior to toileting;Performs perineal hygiene;Adjust clothing after toileting Toileting: 0: Activity did not occur  FIM - Diplomatic Services operational officer Devices: Bedside commode;Sliding board (Drop Arm BSC) Toilet Transfers: 0-Activity did not occur  FIM - Press photographer Assistive Devices: Bed rails;HOB elevated  Bed/Chair Transfer: 5:  Supine > Sit: Supervision (verbal cues/safety issues);5: Bed > Chair or W/C: Supervision (verbal cues/safety issues)  FIM - Locomotion: Wheelchair Distance: 200 Locomotion: Wheelchair: 6: Travels 150 ft or more, turns around, maneuvers to table, bed or toilet, negotiates 3% grade: maneuvers on rugs and over door sills independently FIM - Locomotion: Ambulation Ambulation/Gait Assistance: Not tested (comment) Locomotion: Ambulation: 0: Activity did not occur  Comprehension Comprehension Mode: Auditory Comprehension: 5-Understands complex 90% of the time/Cues < 10% of the time  Expression Expression Mode: Verbal Expression: 6-Expresses complex ideas: With extra time/assistive device  Social Interaction Social Interaction: 7-Interacts appropriately with others - No medications needed.  Problem Solving Problem Solving: 5-Solves complex 90% of the time/cues < 10% of the time  Memory Memory: 5-Recognizes or recalls 90% of the time/requires cueing < 10% of the time  Medical Problem List and Plan:  1. cervico-thoracic transverse myelitis, T8 with Brown-Squard clinical presentation.   -appreciate Neurology follow up  -plasmapheresis 2/5 last night  -MRI essentially unremarkable 2. Right distal fibular fracture. Patient is nonweightbearing. In cast. xr still shows distracted fragments- keep NWB pending ortho follow up 3. DVT Prophylaxis/Anticoagulation: right popliteal DVT. Increased to mg/kg lovenox for 2wks then recheck dopplers. 4. Pain Management: Hydrocodone as needed. Monitor with increased activity   -increased neurontin to 300 tid which has helped dysesthesias 5. Neuropsych: This patient is capable of making decisions on his/her own behalf.  6. Hypertension. Norvasc 5 mg daily, Avapro 150 mg daily. Monitor with increased activity. Normotensive at present  7. History of gout. Allopurinol daily. Monitor for any flareups  8. Prerenal azotemia: pushing liquids. Follow up labs  pending 9.  Thigh wound. Pressure relief. Bowel program. Allevyn, epbc 10. Neurogenic bowel: will work on am bowel program with nursing LOS (Days) 11 A FACE TO FACE EVALUATION WAS PERFORMED  Medha Pippen E 05/01/2012 9:21 AM

## 2012-05-01 NOTE — Progress Notes (Signed)
Physical Therapy Note  Patient Details  Name: Matthew Juarez MRN: [id number] Date of Birth: Apr 20, 1960 Today's Date: 05/01/2012  1100-1130 (30 minutes) individual Pain: no reported pain Focus of treatment: Neuro re-ed RT LE Treatment: transfers scoot to right or left (level) SBA with assist for legrests  wc >< mat; Neuro re-ed RT LE- gravity eliminated hip flexion/extension using suspension grid with 5 second holds at end of active range; hip abduction gravity eliminated with 5 second hold at end of active range.    Aristide Waggle,JIM 05/01/2012, 7:45 AM

## 2012-05-02 ENCOUNTER — Inpatient Hospital Stay (HOSPITAL_COMMUNITY): Payer: BC Managed Care – PPO | Admitting: *Deleted

## 2012-05-02 LAB — CBC
HCT: 35 % — ABNORMAL LOW (ref 39.0–52.0)
Hemoglobin: 11.8 g/dL — ABNORMAL LOW (ref 13.0–17.0)
MCH: 29.2 pg (ref 26.0–34.0)
MCHC: 33.7 g/dL (ref 30.0–36.0)
RDW: 13.1 % (ref 11.5–15.5)

## 2012-05-02 NOTE — Progress Notes (Signed)
Patient ID: Matthew Juarez, male   DOB: 1961-02-17, 52 y.o.   MRN: [id number] Subjective/Complaints: Numbness L>R leg.  "arms are ok"  A 12 point review of systems has been performed and if not noted above is otherwise negative.   Objective: Vital Signs: Blood pressure 96/53, pulse 60, temperature 98.3 F (36.8 C), temperature source Oral, resp. rate 19, height 6\' 5"  (1.956 m), weight 133.1 kg (293 lb 6.9 oz), SpO2 96.00%. No results found.  Basename 05/01/12 1527 04/29/12 1846  WBC -- --  HGB 11.9* 12.6*  HCT 35.0* 37.0*  PLT -- --    Basename 05/01/12 1527 04/29/12 1919  NA 136 135  K 4.1 4.0  CL 103 103  GLUCOSE 98 107*  BUN 11 16  CREATININE 0.90 0.90  CALCIUM -- 8.9   CBG (last 3)  No results found for this basename: GLUCAP:3 in the last 72 hours  Wt Readings from Last 3 Encounters:  05/01/12 133.1 kg (293 lb 6.9 oz)  04/17/12 133.6 kg (294 lb 8.6 oz)  12/05/11 139.708 kg (308 lb)    Physical Exam:  Constitutional: He is oriented to person, place, and time. He appears well-developed.  HENT:  Head: Normocephalic.  Eyes:  Pupils round and reactive to light  Neck: Normal range of motion. Neck supple. No thyromegaly present.  Cardiovascular: Normal rate and regular rhythm. No murmurs  Pulmonary/Chest: Effort normal and breath sounds normal. No respiratory distress.  Abdominal: Soft. Bowel sounds are normal. He exhibits no distension. There is no tenderness.  Musculoskeletal:  Right ankle in splint  Neurological: He is alert and oriented to person, place, and time.  Follows full commands. T8 sensory level. Trace to absent sensation on the left below level, slightly diminished sense to PP/LT on right.(does have paresthesias) RLE remains 1 prox to tr-1 distally at the ankle. LLE is 3+ to 4/5 proximal to distal. UE's are 5/5. Cognitively intact. DTR's are 2+. Cast in place  Skin: thigh breakdown areas healing. No new areas Psychiatric: He has a normal mood and affect.  His behavior is normal. Judgment and thought content normal       Assessment/Plan: 1. Functional deficits secondary to cervico-thoracic transverse myelitis (clinically T8 level) with Brown-Sequard presentation which require 3+ hours per day of interdisciplinary therapy in a comprehensive inpatient rehab setting. Physiatrist is providing close team supervision and 24 hour management of active medical problems listed below. Physiatrist and rehab team continue to assess barriers to discharge/monitor patient progress toward functional and medical goals.   FIM: FIM - Bathing Bathing Steps Patient Completed: Chest;Right Arm;Left Arm;Abdomen;Front perineal area;Right upper leg;Left upper leg;Buttocks;Left lower leg (including foot) Bathing: 5: Set-up assist to: Obtain items  FIM - Upper Body Dressing/Undressing Upper body dressing/undressing steps patient completed: Thread/unthread right sleeve of pullover shirt/dresss;Thread/unthread left sleeve of pullover shirt/dress;Put head through opening of pull over shirt/dress;Pull shirt over trunk Upper body dressing/undressing: 5: Set-up assist to: Obtain clothing/put away FIM - Lower Body Dressing/Undressing Lower body dressing/undressing steps patient completed: Thread/unthread right pants leg;Thread/unthread left pants leg;Pull pants up/down;Don/Doff left shoe;Fasten/unfasten left shoe Lower body dressing/undressing: 5: Set-up assist to: Don/Doff AFO/prosthesis/orthosis (4/4 appropriate tasks)  FIM - Toileting Toileting steps completed by patient: Adjust clothing prior to toileting;Performs perineal hygiene;Adjust clothing after toileting Toileting: 0: Activity did not occur  FIM - Diplomatic Services operational officer Devices: Bedside commode;Sliding board (Drop Arm BSC) Toilet Transfers: 0-Activity did not occur  FIM - Press photographer Assistive Devices: Bed rails;HOB elevated Bed/Chair  Transfer: 5: Supine > Sit:  Supervision (verbal cues/safety issues);5: Bed > Chair or W/C: Supervision (verbal cues/safety issues)  FIM - Locomotion: Wheelchair Distance: 200 Locomotion: Wheelchair: 6: Travels 150 ft or more, turns around, maneuvers to table, bed or toilet, negotiates 3% grade: maneuvers on rugs and over door sills independently FIM - Locomotion: Ambulation Ambulation/Gait Assistance: Not tested (comment) Locomotion: Ambulation: 0: Activity did not occur  Comprehension Comprehension Mode: Auditory Comprehension: 6-Follows complex conversation/direction: With extra time/assistive device  Expression Expression Mode: Verbal Expression: 6-Expresses complex ideas: With extra time/assistive device  Social Interaction Social Interaction: 6-Interacts appropriately with others with medication or extra time (anti-anxiety, antidepressant).  Problem Solving Problem Solving: 6-Solves complex problems: With extra time  Memory Memory: 6-More than reasonable amt of time  Medical Problem List and Plan:  1. cervico-thoracic transverse myelitis, T8 with Brown-Squard clinical presentation.   -appreciate Neurology follow up  -plasmapheresis 2/5 last night  -MRI essentially unremarkable 2. Right distal fibular fracture. Patient is nonweightbearing. In cast. xr still shows distracted fragments- keep NWB pending ortho follow up 3. DVT Prophylaxis/Anticoagulation: right popliteal DVT. Increased to mg/kg lovenox for 2wks then recheck dopplers. 4. Pain Management: Hydrocodone as needed. Monitor with increased activity   -increased neurontin to 300 tid which has helped dysesthesias 5. Neuropsych: This patient is capable of making decisions on his/her own behalf.  6. Hypertension. Norvasc 5 mg daily, Avapro 150 mg daily. Monitor with increased activity. Normotensive at present  7. History of gout. Allopurinol daily. Monitor for any flareups  8. Prerenal azotemia: pushing liquids. Follow up labs pending 9.  Thigh  wound. Pressure relief. Bowel program. Allevyn, epbc 10. Neurogenic bowel: will work on am bowel program with nursing LOS (Days) 12 A FACE TO FACE EVALUATION WAS PERFORMED  KIRSTEINS,ANDREW E 05/02/2012 10:12 AM

## 2012-05-02 NOTE — Progress Notes (Signed)
NEURO HOSPITALIST PROGRESS NOTE   SUBJECTIVE:                                                                                                                        No significant changes. Getting plasmapheresis 3/5 days. PT help greatly appreciated. NMO-IGG antibodies pending.  OBJECTIVE:                                                                                                                           Vital signs in last 24 hours: Temp:  [98.3 F (36.8 C)-99 F (37.2 C)] 98.3 F (36.8 C) (02/02 0628) Pulse Rate:  [56-72] 60  (02/02 0628) Resp:  [12-21] 19  (02/02 0628) BP: (93-118)/(48-63) 96/53 mmHg (02/02 0628) SpO2:  [96 %-100 %] 96 % (02/02 0628) Weight:  [133.1 kg (293 lb 6.9 oz)] 133.1 kg (293 lb 6.9 oz) (02/01 1515)  Intake/Output from previous day: 02/01 0701 - 02/02 0700 In: 440 [P.O.:440] Out: 1500 [Urine:1500] Intake/Output this shift:   Nutritional status: Cardiac  Past Medical History  Diagnosis Date  . Chronic back pain   . Hypertension   . Gout   . Headache   . Arthritis     Neurologic ROS negative with exception of above. Musculoskeletal YQM:VHQIO greater than left leg weakness.  Neurologic Exam:  Mental Status:  Alert, oriented, thought content appropriate. Speech fluent without evidence of aphasia. Able to follow 3 step commands without difficulty.  Cranial Nerves:  II: Discs flat bilaterally; Visual fields grossly normal, pupils equal, round, reactive to light and accommodation  III,IV, VI: ptosis not present, extra-ocular motions intact bilaterally  V,VII: smile symmetric, facial light touch sensation normal bilaterally  VIII: hearing normal bilaterally  IX,X: gag reflex present  XI: bilateral shoulder shrug  XII: midline tongue extension  Motor:  Right : Upper extremity 5/5 Left: Upper extremity 5/5  Right hip and knee flexion 3/5 (right <left). right distal leg in cast.  Tone and bulk:normal  tone throughout; no atrophy noted  Sensory: Decreased left leg and right leg above cast.  Deep Tendon Reflexes: 2+ and symmetric throughout UE. Plantar responses: no tested.  Lab Results: No results found for this basename: cbc, bmp, coags, chol, tri, ldl, hga1c  Lipid Panel No results found for this basename: CHOL,TRIG,HDL,CHOLHDL,VLDL,LDLCALC in the last 72 hours  Studies/Results: No results found.  MEDICATIONS                                                                                                                       I have reviewed the patient's current medications.  ASSESSMENT/PLAN:                                                                                                            Very pleasant 52 years old male with longitudinally extensive transverse myelitis of unclear etiology. No benefit from high dose IV steroids.  Awaiting serum NMO-IGG antibodies. Continue PF and PT. Will follow up.  Wyatt Portela, MD [facility] [phone]  05/02/2012, 1:46 PM

## 2012-05-03 ENCOUNTER — Encounter (HOSPITAL_COMMUNITY): Payer: BC Managed Care – PPO | Admitting: Occupational Therapy

## 2012-05-03 ENCOUNTER — Inpatient Hospital Stay (HOSPITAL_COMMUNITY): Payer: BC Managed Care – PPO | Admitting: Physical Therapy

## 2012-05-03 ENCOUNTER — Inpatient Hospital Stay (HOSPITAL_COMMUNITY): Payer: BC Managed Care – PPO | Admitting: Occupational Therapy

## 2012-05-03 LAB — COMPREHENSIVE METABOLIC PANEL
AST: 18 U/L (ref 0–37)
Albumin: 4.2 g/dL (ref 3.5–5.2)
BUN: 12 mg/dL (ref 6–23)
Calcium: 9.3 mg/dL (ref 8.4–10.5)
Creatinine, Ser: 0.85 mg/dL (ref 0.50–1.35)
Total Bilirubin: 0.9 mg/dL (ref 0.3–1.2)
Total Protein: 5.4 g/dL — ABNORMAL LOW (ref 6.0–8.3)

## 2012-05-03 LAB — CBC
HCT: 34.2 % — ABNORMAL LOW (ref 39.0–52.0)
MCH: 29.5 pg (ref 26.0–34.0)
MCHC: 33.9 g/dL (ref 30.0–36.0)
MCV: 87 fL (ref 78.0–100.0)
Platelets: 140 10*3/uL — ABNORMAL LOW (ref 150–400)
RDW: 13.1 % (ref 11.5–15.5)
WBC: 4.7 10*3/uL (ref 4.0–10.5)

## 2012-05-03 MED ORDER — ACD FORMULA A 0.73-2.45-2.2 GM/100ML VI SOLN
Status: AC
  Filled 2012-05-03: qty 500

## 2012-05-03 MED ORDER — ACD FORMULA A 0.73-2.45-2.2 GM/100ML VI SOLN
Status: AC
  Administered 2012-05-03: 68 mL
  Filled 2012-05-03: qty 500

## 2012-05-03 MED ORDER — SODIUM CHLORIDE 0.9 % IV SOLN
4.0000 g | INTRAVENOUS | Status: DC | PRN
  Administered 2012-05-03: 4 g via INTRAVENOUS
  Filled 2012-05-03 (×2): qty 40

## 2012-05-03 MED ORDER — CALCIUM CARBONATE ANTACID 500 MG PO CHEW
CHEWABLE_TABLET | ORAL | Status: AC
  Filled 2012-05-03: qty 2

## 2012-05-03 MED ORDER — DIPHENHYDRAMINE HCL 25 MG PO CAPS: 25.0000 mg | ORAL_CAPSULE | Freq: Four times a day (QID) | ORAL | Status: DC | PRN

## 2012-05-03 MED ORDER — ENOXAPARIN SODIUM 100 MG/ML ~~LOC~~ SOLN
100.0000 mg | Freq: Two times a day (BID) | SUBCUTANEOUS | Status: DC
  Administered 2012-05-03 – 2012-05-10 (×14): 100 mg via SUBCUTANEOUS
  Filled 2012-05-03 (×18): qty 1

## 2012-05-03 MED ORDER — GELATIN ABSORBABLE 12-7 MM EX MISC
1.0000 | Freq: Once | CUTANEOUS | Status: DC
  Filled 2012-05-03: qty 1

## 2012-05-03 MED ORDER — CALCIUM GLUCONATE 10 % IV SOLN
2.0000 g | INTRAVENOUS | Status: DC | PRN
  Filled 2012-05-03: qty 20

## 2012-05-03 MED ORDER — CALCIUM CARBONATE ANTACID 500 MG PO CHEW
CHEWABLE_TABLET | ORAL | Status: AC
  Administered 2012-05-03: 400 mg via ORAL
  Filled 2012-05-03: qty 2

## 2012-05-03 MED ORDER — CALCIUM CARBONATE ANTACID 500 MG PO CHEW
2.0000 | CHEWABLE_TABLET | ORAL | Status: AC
  Administered 2012-05-03 (×2): 400 mg via ORAL
  Filled 2012-05-03 (×2): qty 2

## 2012-05-03 MED ORDER — SODIUM CHLORIDE 0.9 % IV SOLN
INTRAVENOUS | Status: AC
  Administered 2012-05-03 (×5): via INTRAVENOUS_CENTRAL
  Filled 2012-05-03 (×5): qty 200

## 2012-05-03 MED ORDER — SODIUM CHLORIDE 0.9 % IV SOLN
2.0000 g | INTRAVENOUS | Status: DC | PRN
  Filled 2012-05-03: qty 20

## 2012-05-03 MED ORDER — HEPARIN SODIUM (PORCINE) 1000 UNIT/ML IJ SOLN
1000.0000 [IU] | Freq: Once | INTRAMUSCULAR | Status: AC
  Administered 2012-05-03: 1000 [IU]

## 2012-05-03 MED ORDER — ACD FORMULA A 0.73-2.45-2.2 GM/100ML VI SOLN
500.0000 mL | Status: DC
  Administered 2012-05-03: 500 mL via INTRAVENOUS
  Filled 2012-05-03: qty 500

## 2012-05-03 MED ORDER — CALCIUM CARBONATE ANTACID 500 MG PO CHEW
2.0000 | CHEWABLE_TABLET | ORAL | Status: DC | PRN
  Filled 2012-05-03: qty 2

## 2012-05-03 MED ORDER — ACETAMINOPHEN 325 MG PO TABS: 650.0000 mg | ORAL_TABLET | ORAL | Status: DC | PRN

## 2012-05-03 NOTE — Progress Notes (Signed)
ANTICOAGULATION CONSULT NOTE - Follow Up Consult  Pharmacy Consult for Lovenox Indication: DVT  Labs:  [facility] 05/02/12 0925 05/01/12 1527  HGB 11.8* 11.9*  HCT 35.0* 35.0*  PLT 134* --  APTT -- --  LABPROT -- --  INR -- --  HEPARINUNFRC -- --  CREATININE -- 0.90  CKTOTAL -- --  CKMB -- --  TROPONINI -- --    Assessment: 51yo male started on Lovenox 12 days ago for DVT; this pm RN reports that pt has been bleeding from plasmapheresis port with concern for supratherapeutic anticoagulation; obtained anti-Xa level which was drawn ~4.5hr after dose and reported as 1.47, above goal.  Bleeding being managed with Gelfoam and monitored.  Goal of Therapy:  Anti-Xa level 0.6-1.2 units/ml 4hrs after LMWH dose given   Plan:  Will decrease Lovenox by 25% to 100mg  SQ Q12H and consider additional anti-Xa level to confirm dosing.  Colleen Can PharmD BCPS 05/03/2012,2:07 AM

## 2012-05-03 NOTE — Progress Notes (Signed)
Patient ID: Matthew Juarez, male   DOB: June 17, 1960, 52 y.o.   MRN: [id number] Subjective/Complaints: No new complaints. An uneventul weekend. Had more questions about prognosis   A 12 point review of systems has been performed and if not noted above is otherwise negative.   Objective: Vital Signs: Blood pressure 97/59, pulse 61, temperature 98.4 F (36.9 C), temperature source Oral, resp. rate 18, height 6\' 5"  (1.956 m), weight 133.1 kg (293 lb 6.9 oz), SpO2 97.00%. No results found.  Basename 05/03/12 0940 05/02/12 0925  WBC 4.7 5.2  HGB 11.6* 11.8*  HCT 34.2* 35.0*  PLT 140* 134*    Basename 05/01/12 1527  NA 136  K 4.1  CL 103  GLUCOSE 98  BUN 11  CREATININE 0.90  CALCIUM --   CBG (last 3)  No results found for this basename: GLUCAP:3 in the last 72 hours  Wt Readings from Last 3 Encounters:  05/01/12 133.1 kg (293 lb 6.9 oz)  04/17/12 133.6 kg (294 lb 8.6 oz)  12/05/11 139.708 kg (308 lb)    Physical Exam:  Constitutional: He is oriented to person, place, and time. He appears well-developed.  HENT:  Head: Normocephalic.  Eyes:  Pupils round and reactive to light  Neck: Normal range of motion. Neck supple. No thyromegaly present.  Cardiovascular: Normal rate and regular rhythm. No murmurs  Pulmonary/Chest: Effort normal and breath sounds normal. No respiratory distress.  Abdominal: Soft. Bowel sounds are normal. He exhibits no distension. There is no tenderness.  Musculoskeletal:  Right ankle in splint  Neurological: He is alert and oriented to person, place, and time.  Follows full commands. T8 sensory level. Trace to absent sensation on the left below level, slightly diminished sense to PP/LT on right.(does have paresthesias) RLE remains 1 prox to tr-1 distally at the ankle. LLE is 3+ to 4/5 proximal to distal. UE's are 5/5. Cognitively intact. DTR's are 2+. Cast in place  Skin: thigh breakdown areas healing. No new areas Psychiatric: He has a normal mood and  affect. His behavior is normal. Judgment and thought content normal       Assessment/Plan: 1. Functional deficits secondary to cervico-thoracic transverse myelitis (clinically T8 level) with Brown-Sequard presentation which require 3+ hours per day of interdisciplinary therapy in a comprehensive inpatient rehab setting. Physiatrist is providing close team supervision and 24 hour management of active medical problems listed below. Physiatrist and rehab team continue to assess barriers to discharge/monitor patient progress toward functional and medical goals.   FIM: FIM - Bathing Bathing Steps Patient Completed: Chest;Right Arm;Left Arm;Abdomen;Front perineal area;Right upper leg;Left upper leg;Buttocks;Left lower leg (including foot) Bathing: 5: Set-up assist to: Obtain items  FIM - Upper Body Dressing/Undressing Upper body dressing/undressing steps patient completed: Thread/unthread right sleeve of pullover shirt/dresss;Thread/unthread left sleeve of pullover shirt/dress;Put head through opening of pull over shirt/dress;Pull shirt over trunk Upper body dressing/undressing: 5: Set-up assist to: Obtain clothing/put away FIM - Lower Body Dressing/Undressing Lower body dressing/undressing steps patient completed: Thread/unthread right pants leg;Thread/unthread left pants leg;Pull pants up/down;Don/Doff left shoe;Fasten/unfasten left shoe Lower body dressing/undressing: 5: Set-up assist to: Don/Doff AFO/prosthesis/orthosis (4/4 appropriate tasks)  FIM - Toileting Toileting steps completed by patient: Adjust clothing prior to toileting;Performs perineal hygiene;Adjust clothing after toileting Toileting: 4: Steadying assist  FIM - Diplomatic Services operational officer Devices: Bedside commode;Sliding board (Drop Arm BSC) Toilet Transfers: 0-Activity did not occur  FIM - Banker Devices: Bed rails;HOB elevated Bed/Chair Transfer: 5: Supine > Sit:  Supervision (verbal cues/safety issues);5: Bed > Chair or W/C: Supervision (verbal cues/safety issues)  FIM - Locomotion: Wheelchair Distance: 200 Locomotion: Wheelchair: 6: Travels 150 ft or more, turns around, maneuvers to table, bed or toilet, negotiates 3% grade: maneuvers on rugs and over door sills independently FIM - Locomotion: Ambulation Ambulation/Gait Assistance: Not tested (comment) Locomotion: Ambulation: 0: Activity did not occur  Comprehension Comprehension Mode: Auditory Comprehension: 6-Follows complex conversation/direction: With extra time/assistive device  Expression Expression Mode: Verbal Expression: 6-Expresses complex ideas: With extra time/assistive device  Social Interaction Social Interaction: 6-Interacts appropriately with others with medication or extra time (anti-anxiety, antidepressant).  Problem Solving Problem Solving: 6-Solves complex problems: With extra time  Memory Memory: 6-More than reasonable amt of time  Medical Problem List and Plan:  1. cervico-thoracic transverse myelitis, T8 with Brown-Squard clinical presentation.   -appreciate Neurology follow up  -plasmapheresis 4/5 tonight  -labwork looking ok  -MRI essentially unremarkable 2. Right distal fibular fracture. Patient is nonweightbearing. In cast. xr still shows distracted fragments- keep NWB pending ortho follow up 3. DVT Prophylaxis/Anticoagulation: right popliteal DVT. Increased to mg/kg lovenox for 2wks then recheck dopplers. 4. Pain Management: Hydrocodone as needed. Monitor with increased activity   -increased neurontin to 300 tid which has helped dysesthesias 5. Neuropsych: This patient is capable of making decisions on his/her own behalf.  6. Hypertension. Norvasc 5 mg daily, Avapro 150 mg daily. Monitor with increased activity. Normotensive at present  7. History of gout. Allopurinol daily. Monitor for any flareups  8. Prerenal azotemia: pushing liquids. Follow up labs  pending 9.  Thigh wound. Pressure relief. Bowel program. Allevyn, epbc 10. Neurogenic bowel: working on bowel program LOS (Days) 13 A FACE TO FACE EVALUATION WAS PERFORMED  Matthew Juarez T 05/03/2012 10:34 AM

## 2012-05-03 NOTE — Progress Notes (Signed)
Occupational Therapy Session Note  Patient Details  Name: Matthew Juarez MRN: [id number] Date of Birth: 07-27-1960  Today's Date: 05/03/2012 Time: 0905-0950 Time Calculation (min): 45 min  Skilled Therapeutic Interventions/Progress Updates:    Pt seen for individualized OT treatment session for bathing and dressing at bed level (peri area/buttocks) and seated EOB. Focus on bed mobility,sliding board transfers transfers, activity tolerance, safety awareness, and RLE positioning/management. Pt is able to manage RLE to sit EOB but required assistance/vc's with positioning when sitting in w/c for grooming seated at sink. Pt performed sliding board transfer with supervision level assistance & VC's for placement during bed-w/c transfer today. Pt is motivated.  Therapy Documentation Precautions:  Precautions Precautions: Fall Precaution Comments: R leg weakness, but + sensation, left leg lack of coordination and no sensation. Required Braces or Orthoses: Other Brace/Splint Other Brace/Splint: R foot casted Restrictions Weight Bearing Restrictions: Yes RLE Weight Bearing: Non weight bearing LLE Weight Bearing: Weight bearing as tolerated     Pain: Pain Assessment Pain Assessment: No/denies pain Pain Score: 0-No pain       See FIM for current functional status  Therapy/Group: Individual Therapy  Alm Bustard 05/03/2012, 12:52 PM

## 2012-05-03 NOTE — Progress Notes (Signed)
Pt on 0600 bowel program, with suppository given at 0600, per report. Pt now reports that he can feel the urge to defecate. Large loose stool x 2 on bedpan, per report. No incontinent episode reported today. Pt remains continent of bladder with use of urinal. Requires staff to remove and empty. No c/o pain this shift.

## 2012-05-03 NOTE — Progress Notes (Signed)
TPE completed without issue. Total exchange volume =4200. Pt tolerated well. Report called to RN on 4000. Transported in stable condition.

## 2012-05-03 NOTE — Plan of Care (Signed)
Problem: RH PAIN MANAGEMENT Goal: RH STG PAIN MANAGED AT OR BELOW PT'S PAIN GOAL 2/10  Outcome: Progressing Pain managed with scheduled Neurotin 300mg 

## 2012-05-03 NOTE — Progress Notes (Signed)
Occupational Therapy Session Note  Patient Details  Name: Matthew Juarez MRN: [id number] Date of Birth: 05/10/1960  Today's Date: 05/03/2012 Time: 1100-1153 Time Calculation (min): 53 min  Skilled Therapeutic Interventions/Progress Updates:    Patient seen this am for OT  Session to address functional transfers.  Patient practiced transfer to tub transfer bench in ADL apartment, and also practiced bed to drop arm commode transfer in room.  Patient completed squat pivot transfers with emphasis on hip motion to cover greater distance once boosted up by upper extremities.  Patient only used slide board with chair to bed transfer as he was uncomfortable with expanse of space to cover. Patient able to adequately place board, and therapists served to stabilize chair.  Wife able to problem solve with patient through each of these transitions.  Practiced clothing management and hygiene while seated on commode with bed on one side and wheelchair on the other for safe and effective lateral leans.  Patient very motivated for increased independence, and safe with all activities.    Therapy Documentation Precautions:  Precautions Precautions: Fall Precaution Comments: R leg weakness, but + sensation, left leg lack of coordination and no sensation. Required Braces or Orthoses: Other Brace/Splint Other Brace/Splint: R foot casted Restrictions Weight Bearing Restrictions: Yes RLE Weight Bearing: Non weight bearing LLE Weight Bearing: Weight bearing as tolerated   Pain: Pain Assessment Pain Assessment: No/denies pain Pain Score: 0-No pain    See FIM for current functional status  Therapy/Group: Individual Therapy  Collier Salina 05/03/2012, 11:54 AM

## 2012-05-03 NOTE — Progress Notes (Signed)
Physical Therapy Session Note  Patient Details  Name: Matthew Juarez MRN: [id number] Date of Birth: [birth year]  Today's Date: 05/03/2012 Time:Session #1: 1001-1106, Session #2:  4098-1191 Time Calculation (min): Session #1: 65 min, Session #2: 15 min  Short Term Goals: Week 2:  PT Short Term Goal 1 (Week 2): Bed mobility both supine to sit and sit to supine mod I using whatever assitive device needed to maneuver right leg into and out of bed.   PT Short Term Goal 2 (Week 2): Squat pivot transfers to both left and right min assist to elevated surface to the left and lower surface to the right to simulate bed transfer at home.   PT Short Term Goal 3 (Week 2): Car transfer with slide board supervision PT Short Term Goal 4 (Week 2): WC mobility >150' with mod I assist needed only for parts management( leg rests and armrest).   PT Short Term Goal 5 (Week 2): Standing at mat table with RW x 2 mins mod assist to stabilize RW from elevated surface while maintaining NWB right foot.  Skilled Therapeutic Interventions/Progress Updates:    Session #1: This session focused on WC mobility to and from the gym for upper extremity strength and endurance training mod I.  Car transfers educating wife on where and how to assist x1 with PT demonstrating and x1 with wife assisting patient.  Min assist into the car with scoot pivot assist needed to stabilize the WC to keep it from moving during the transfer, and mod assist to get out of the car using a slide board due to management of slide board and assist to lift right thigh over the edge of the WC seat cushion to clear it while scooting across the slide board.  Wife to get measurements for the bed, doorways and height of the car seat so we can more accurately practice these transfers.  WC mobility over carpeted surfaces in ADL apartment mod I maneuvering tight spaces 25'.  WC to bed transfer to elevated bed going to pt's left side min assist to stabilize the WC squat  pivot.  Sit to supine trying to use momentum to get right leg into the bed with hands hooked behind right thigh.  Pt not quite able to do this, so required min assist to get right foot into bed.  Supine to sit supervision.  Standing at the side of the bed 2 person assist (one person was his wife) one to stabilize the RW, one to control left knee during transition from sit to stand and stand to sit x 3 reps, max stand time 60 seconds.  Bed to [facility] on pt's left, min assist to stabilize WC from moving away.    Session #2: During this session the pt missed 15 mins of therapy due to having a massive BM.  I spoke at length with the pt and his wife (through the curtain) re: bowel movements, bowel program/toileting programs and strategies for regulating the urgency he feels to have a BM when he does need to go (i.e. Setting a time daily to try to get on the toilet and have a BM before the "gotta go" scenario occurs.  I also educated them that sometimes anal stimulation can help him start to go when he doesn't feel like he needs to go at this time and that his nurse may be more well versed in helping him with a bowel and bladder program.    Therapy Documentation Precautions:  Precautions Precautions:  Fall Precaution Comments: R leg weakness, but + sensation, left leg lack of coordination and no sensation. Required Braces or Orthoses: Other Brace/Splint Other Brace/Splint: R foot casted Restrictions Weight Bearing Restrictions: Yes RLE Weight Bearing: Non weight bearing LLE Weight Bearing: Weight bearing as tolerated General: Amount of Missed PT Time (min): 15 Minutes Missed Time Reason: Other (comment) (pt having a massive bowel movement) Vital Signs: Therapy Vitals Temp: 98.3 F (36.8 C) Temp src: Oral Pulse Rate: 73  Resp: 18  BP: 106/70 mmHg Patient Position, if appropriate: Sitting Oxygen Therapy SpO2: 94 %  Locomotion : Wheelchair Mobility Distance: 150   See FIM for current functional  status  Therapy/Group: Individual Therapy  Matthew Joiner B. Matthew Juarez, PT, DPT [phone]   05/03/2012, 5:03 PM

## 2012-05-04 ENCOUNTER — Inpatient Hospital Stay (HOSPITAL_COMMUNITY): Payer: BC Managed Care – PPO | Admitting: Physical Therapy

## 2012-05-04 ENCOUNTER — Inpatient Hospital Stay (HOSPITAL_COMMUNITY): Payer: BC Managed Care – PPO

## 2012-05-04 ENCOUNTER — Encounter (HOSPITAL_COMMUNITY): Payer: BC Managed Care – PPO

## 2012-05-04 DIAGNOSIS — Z86718 Personal history of other venous thrombosis and embolism: Secondary | ICD-10-CM

## 2012-05-04 LAB — POCT I-STAT, CHEM 8
BUN: 11 mg/dL (ref 6–23)
Calcium, Ion: 1.31 mmol/L — ABNORMAL HIGH (ref 1.12–1.23)
HCT: 34 % — ABNORMAL LOW (ref 39.0–52.0)
Hemoglobin: 11.6 g/dL — ABNORMAL LOW (ref 13.0–17.0)
Sodium: 139 mEq/L (ref 135–145)
TCO2: 24 mmol/L (ref 0–100)

## 2012-05-04 MED ORDER — SACCHAROMYCES BOULARDII 250 MG PO CAPS
250.0000 mg | ORAL_CAPSULE | Freq: Two times a day (BID) | ORAL | Status: DC
  Administered 2012-05-04 – 2012-05-11 (×14): 250 mg via ORAL
  Filled 2012-05-04 (×17): qty 1

## 2012-05-04 NOTE — Progress Notes (Signed)
Patient ID: Matthew Juarez, male   DOB: [birth year], 52 y.o.   MRN: [id number] Subjective/Complaints: Some bleeding at cath site. Otherwise no issues.    A 12 point review of systems has been performed and if not noted above is otherwise negative.   Objective: Vital Signs: Blood pressure 106/58, pulse 61, temperature 98.2 F (36.8 C), temperature source Oral, resp. rate 18, height 6\' 5"  (1.956 m), weight 136 kg (299 lb 13.2 oz), SpO2 98.00%. No results found.  Basename 05/03/12 0940 05/02/12 0925  WBC 4.7 5.2  HGB 11.6* 11.8*  HCT 34.2* 35.0*  PLT 140* 134*    Basename 05/03/12 1715 05/01/12 1527  NA 136 136  K 3.6 4.1  CL 103 103  GLUCOSE 97 98  BUN 12 11  CREATININE 0.85 0.90  CALCIUM 9.3 --   CBG (last 3)  No results found for this basename: GLUCAP:3 in the last 72 hours  Wt Readings from Last 3 Encounters:  05/03/12 136 kg (299 lb 13.2 oz)  04/17/12 133.6 kg (294 lb 8.6 oz)  12/05/11 139.708 kg (308 lb)    Physical Exam:  Constitutional: He is oriented to person, place, and time. He appears well-developed.  HENT:  Head: Normocephalic.  Eyes:  Pupils round and reactive to light  Neck: Normal range of motion. Neck supple. No thyromegaly present.  Cardiovascular: Normal rate and regular rhythm. No murmurs  Pulmonary/Chest: Effort normal and breath sounds normal. No respiratory distress.  Abdominal: Soft. Bowel sounds are normal. He exhibits no distension. There is no tenderness.  Musculoskeletal:  Right ankle in splint  Neurological: He is alert and oriented to person, place, and time.  Follows full commands. T8 sensory level. Trace to absent sensation on the left below level, slightly diminished sense to PP/LT on right.(does have paresthesias) RLE remains 1 prox to tr-1 distally at the ankle. LLE is 3+ to 4/5 proximal to distal. UE's are 5/5. Cognitively intact. DTR's are 2+. Cast in place  Skin: thigh breakdown areas healing. No new areas. Bleeding from dressing at  cath site noted Psychiatric: He has a normal mood and affect. His behavior is normal. Judgment and thought content normal       Assessment/Plan: 1. Functional deficits secondary to cervico-thoracic transverse myelitis (clinically T8 level) with Brown-Sequard presentation which require 3+ hours per day of interdisciplinary therapy in a comprehensive inpatient rehab setting. Physiatrist is providing close team supervision and 24 hour management of active medical problems listed below. Physiatrist and rehab team continue to assess barriers to discharge/monitor patient progress toward functional and medical goals.   FIM: FIM - Bathing Bathing Steps Patient Completed: Chest;Right Arm;Left Arm;Abdomen;Front perineal area;Buttocks;Right upper leg;Left upper leg;Left lower leg (including foot) Bathing: 5: Set-up assist to: Obtain items  FIM - Upper Body Dressing/Undressing Upper body dressing/undressing steps patient completed: Thread/unthread right sleeve of pullover shirt/dresss;Thread/unthread left sleeve of pullover shirt/dress;Put head through opening of pull over shirt/dress;Pull shirt over trunk Upper body dressing/undressing: 5: Set-up assist to: Obtain clothing/put away FIM - Lower Body Dressing/Undressing Lower body dressing/undressing steps patient completed: Thread/unthread right pants leg;Thread/unthread left pants leg;Pull pants up/down;Fasten/unfasten pants;Don/Doff left shoe;Fasten/unfasten left shoe Lower body dressing/undressing: 5: Set-up assist to: Don/Doff TED stocking  FIM - Toileting Toileting steps completed by patient: Adjust clothing prior to toileting;Performs perineal hygiene;Adjust clothing after toileting Toileting: 0: Activity did not occur  FIM - Diplomatic Services operational officer Devices: Bedside commode;Sliding board (Drop Arm BSC) Toilet Transfers: 0-Activity did not occur  FIM - Bed/Chair  Transport planner Devices: Sliding  board;Arm rests Bed/Chair Transfer: 5: Supine > Sit: Supervision (verbal cues/safety issues);4: Sit > Supine: Min A (steadying pt. > 75%/lift 1 leg);4: Bed > Chair or W/C: Min A (steadying Pt. > 75%);4: Chair or W/C > Bed: Min A (steadying Pt. > 75%)  FIM - Locomotion: Wheelchair Distance: 150 Locomotion: Wheelchair: 6: Travels 150 ft or more, turns around, maneuvers to table, bed or toilet, negotiates 3% grade: maneuvers on rugs and over door sills independently FIM - Locomotion: Ambulation Ambulation/Gait Assistance: Not tested (comment) Locomotion: Ambulation: 0: Activity did not occur  Comprehension Comprehension Mode: Auditory Comprehension: 5-Understands complex 90% of the time/Cues < 10% of the time  Expression Expression Mode: Verbal Expression: 5-Expresses complex 90% of the time/cues < 10% of the time  Social Interaction Social Interaction: 7-Interacts appropriately with others - No medications needed.  Problem Solving Problem Solving: 6-Solves complex problems: With extra time  Memory Memory: 6-More than reasonable amt of time  Medical Problem List and Plan:  1. cervico-thoracic transverse myelitis, T8 with Brown-Squard clinical presentation.   -appreciate Neurology follow up  -plasmapheresis 4/5 completed  -INR to follow up on cath today.  -labwork looking ok  -MRI essentially unremarkable 2. Right distal fibular fracture. Patient is nonweightbearing. In cast. xr still shows distracted fragments- keep NWB pending ortho follow up 3. DVT Prophylaxis/Anticoagulation: right popliteal DVT--on lovenox therapeutic dosing. Recheck dopplers today 4. Pain Management: Hydrocodone as needed. Monitor with increased activity   - neurontin to 300 tid which has helped dysesthesias 5. Neuropsych: This patient is capable of making decisions on his/her own behalf.  6. Hypertension. Norvasc 5 mg daily, Avapro 150 mg daily. Monitor with increased activity. Normotensive at present  7.  History of gout. Allopurinol daily. Monitor for any flareups  8. Prerenal azotemia: pushing liquids. Follow up labs pending 9.  Thigh wound. Pressure relief. Bowel program. Allevyn, epbc 10. Neurogenic bowel: continuing to work on bowel program LOS (Days) 14 A FACE TO FACE EVALUATION WAS PERFORMED  Lamari Beckles T 05/04/2012 8:21 AM

## 2012-05-04 NOTE — Progress Notes (Signed)
Occupational Therapy Session Note  Patient Details  Name: Matthew Juarez MRN: [id number] Date of Birth: Jul 17, 1960  Today's Date: 05/04/2012  Session 1 Time: 1000-1100 Time Calculation (min): 60 min  Short Term Goals: Week 2:  OT Short Term Goal 1 (Week 2): Pt will perform Drop Arm BSC transfers to uneven surface with supervision OT Short Term Goal 2 (Week 2): Pt will perform toileting tasks with min A OT Short Term Goal 3 (Week 2): Pt will perform tub bench transfer with supervision  Skilled Therapeutic Interventions/Progress Updates:    Pt in bed upon arrival.  Pt stated he had just recently had a bowel movement an early morning suppository and did not need to clean up or change brief.  Pt sat EOB with supervision to complete UB bathing and UB and LB dressing.  Pt completed all task with supervision using AE to assist with threading pants.  Pt transferred to w/c with sccot pivot transfer with supervision and therapist steadying w/c during transfer.  After grooming at sink pt rolled to gym for UB therex on UE ergonometer (5 min @ level 9, 1 min rest, 5 min @ level 9 in reverse, 1 min rest, 2 min @ level 2 and 80 RPM)  Pt rolled back to room.  Therapy Documentation Precautions:  Precautions Precautions: Fall Precaution Comments: R leg weakness, but + sensation, left leg lack of coordination and no sensation. Required Braces or Orthoses: Other Brace/Splint Other Brace/Splint: R foot casted Restrictions Weight Bearing Restrictions: Yes RLE Weight Bearing: Non weight bearing LLE Weight Bearing: Weight bearing as tolerated   Pain: Pain Assessment Pain Assessment: No/denies pain Pain Score: 0-No pain  See FIM for current functional status  Therapy/Group: Individual Therapy  Session 2 Time: 1191-4782 Pt denies pain Individual Therapy Pt engaged in practicing squat pivot transfers w/c<>mat and bed mobility on mat.  Focus on body mechanics during transfer and LLE positioning.  Pt's  wife present and practiced providing appropriate assistance for bed mobility.     Lavone Neri [facility] 05/04/2012, 11:05 AM

## 2012-05-04 NOTE — Progress Notes (Addendum)
Physical Therapy Weekly Progress Note  Patient Details  Name: Matthew Juarez MRN: [id number] Date of Birth: 11-Nov-1960  Today's Date: 05/04/2012 Time:Session #1:  4098-1191, Session #2: [id number] Time Calculation (min): Session #1: 57 min, Session #2: 51 min  Patient has met 2 of 5 short term goals.    Patient continues to demonstrate the following deficits: decreased mobility, decreased right leg strength and left leg coordination, decreased balance, decreased endurance and activity tolerance and therefore will continue to benefit from skilled PT intervention to enhance overall performance with activity tolerance, balance, ability to compensate for deficits, functional use of  right lower extremity and left lower extremity and coordination.  Patient progressing toward long term goals..  Continue plan of care.  PT Short Term Goals Week 2:  PT Short Term Goal 1 (Week 2): Bed mobility both supine to sit and sit to supine mod I using whatever assitive device needed to maneuver right leg into and out of bed.   PT Short Term Goal 1 - Progress (Week 2): Progressing toward goal PT Short Term Goal 2 (Week 2): Squat pivot transfers to both left and right min assist to elevated surface to the left and lower surface to the right to simulate bed transfer at home.   PT Short Term Goal 2 - Progress (Week 2): Met PT Short Term Goal 3 (Week 2): Car transfer with slide board supervision PT Short Term Goal 3 - Progress (Week 2): Progressing toward goal PT Short Term Goal 4 (Week 2): WC mobility >150' with mod I assist needed only for parts management( leg rests and armrest).   PT Short Term Goal 4 - Progress (Week 2): Met PT Short Term Goal 5 (Week 2): Standing at mat table with RW x 2 mins mod assist to stabilize RW from elevated surface while maintaining NWB right foot. PT Short Term Goal 5 - Progress (Week 2): Progressing toward goal  Skilled Therapeutic Interventions/Progress Updates:    Session #1: This  session focused on bed mobility to donn brief and pants min assist to get rolled completely on side due to limited space in twin sized bed. Rolled bil x 2 using rails for assist.  Supine to sit with HOB flat using rails supervision.  Scoot transfer to [facility] on pt's left from 1" elevated surface min assist to stabilize WC, but not sure if that was needed today (will try to not do this in the future to see if pt can get to supervision soon).  WC mobility 150' x 2 to and from the gym mod I with assist of bil leg rests and armrests only.  Transfer to NuStep towards the left min assist to stabilize NuStep and WC.  NuStep level 1 x 3 mins', rest break, 2', rest break, RPE on Borg scale 13/20 10 mins total with rest breaks < 1 min every 2 mins due to decreased endurance.  Scoot pivot off of Nu step min assist of right leg since we were going to the right while stabilizing the WC from moving as well.    Session #2: This session focused on transfer training scoot pivot from low WC to high mat table set up same height as bed.  Min assist to stabilize WC, but pt able to do the 3-4" height difference scoot without external assist.  Sit to stand from high mat table 2 person mod assist one to stabilize RW, one to help control knee and left foot (foot likes to jump back when pt  goes to push up to stand, same thing happens with transfers).  Standing trails multiple >5 with max stand time 1 min 30 sec.  Scoot pivot transfer high mat table to low WC min assist to stabilize WC and left foot to keep them both from moving around during the transfer.  Discussed wanting to get wife more independent with managing pt at home, especially standing practice if able to get safe with only one person assisting.  WC mobility to and from the gym mod I >150' tiled surfaces.   Therapy Documentation Precautions:  Precautions Precautions: Fall Precaution Comments: R leg weakness, but + sensation, left leg lack of coordination and no  sensation. Required Braces or Orthoses: Other Brace/Splint Other Brace/Splint: R foot casted Restrictions Weight Bearing Restrictions: Yes RLE Weight Bearing: Non weight bearing LLE Weight Bearing: Weight bearing as tolerated    Pain: Pain Assessment Pain Assessment: No/denies pain Pain Score: 0-No pain   Locomotion : Wheelchair Mobility Distance: 150   See FIM for current functional status  Therapy/Group: Individual Therapy  Lurena Joiner B. Aariz Maish, PT, DPT [phone]   05/04/2012, 11:18 AM

## 2012-05-04 NOTE — Progress Notes (Signed)
Continued bleeding from plasmapharesis catheter which was placed 1/28. Per patient began Saturday and has been functioning well with use.  Dressing removed which revealed no evidence of bleed except at a suture site. VPad placed under sterile gauze and pressure dressing which should remain 24 hours. RN to contact me if any further questions. Orders placed on proper removal of hemostasis pad. Will hold am lovenox to assist with clotting.

## 2012-05-04 NOTE — Progress Notes (Signed)
RN called to room to check on patient's bleeding catheter. Reinforced dressing and applied pressure to sight. Bleeding stopped. Will continue to monitor.

## 2012-05-04 NOTE — Progress Notes (Signed)
VASCULAR LAB PRELIMINARY  PRELIMINARY  PRELIMINARY  PRELIMINARY  Right lower extremity venous duplex completed.    Preliminary report:  Right:  No obvious evidence of DVT, superficial thrombosis, or Baker's cyst. Technically difficult due to short leg cast from just below the knee to the foot.  Matthew Juarez, RVS 05/04/2012, 5:53 PM

## 2012-05-04 NOTE — Patient Care Conference (Signed)
Inpatient RehabilitationTeam Conference and Plan of Care Update Date: 05/04/2012   Time: 2:25 PM    Patient Name: Matthew Juarez      Medical Record Number: [id number]  Date of Birth: [birth year] Sex: Male         Room/Bed: 4038/4038-01 Payor Info: Payor: BLUE CROSS BLUE [employer]IELD  Plan: [facility] PPO  Product Type: *No Product type*     Admitting Diagnosis: TRANS MYELITIS   Admit Date/Time:  04/20/2012  6:05 PM Admission Comments: No comment available   Primary Diagnosis:  Transverse myelitis Principal Problem: Transverse myelitis  Patient Active Problem List   Diagnosis Date Noted  . Transverse myelitis 04/21/2012  . Closed right ankle fracture 04/19/2012  . Lower paraplegia 04/17/2012  . Acute transverse myelitis 04/17/2012  . NSVT (nonsustained ventricular tachycardia) 04/17/2012  . Chronic back pain     Expected Discharge Date: Expected Discharge Date: 05/11/12  Team Members Present: Physician leading conference: Dr. Faith Rogue Social Worker Present: Amada Jupiter, LCSW Nurse Present: Daryll Brod, RN PT Present: Karolee Stamps, PT;Other (comment) Corinna Capra., PT) OT Present: Edwin Cap, Loistine Chance, OT Other (Discipline and Name): Oran Rein, RD; Tora Duck, PPS Coordinator; Ottie Glazier, RN     Current Status/Progress Goal Weekly Team Focus  Medical   tolerating plasmepheresis. no neuro changes  maximize mobility,   continue to work on bowel program   Bowel/Bladder   Continent of bowel and bladder. LBM 05/03/12.   Pt to remain continent of bowel and bladder  Continue with 0600 bowel program   Swallow/Nutrition/ Hydration             ADL's   supervision bathing and dressing bed level; supervision/min a transfers; tot A toileting with bowel program  mod I/supervision overall   toileting; bed mobility; transfers; family educaiton   Mobility   mod I WC mobility, min assist transfers scoot pivot and slide board to the left, min assist to the right if  high to low or level transfers, if low to high on the right mod assist to help facilitate getting right thigh up and over edge of WC.  Car transfers min assist scoot pivot in, mod assist slide board out, standing with RW from elevated surface 2 person assist.    mod I WC mobility, transfers, bed mobility  transfers slide board and squat pivot, WC mobility, car transfers, standing tolerance, left leg coordination and control.   Communication             Safety/Cognition/ Behavioral Observations            Pain   No c/o pain. Pain managed with scheduled Neurotin 300 mg  <3  Monitor    Skin   Skin abrasion to L shin x 2, OTA, unremarkable. Skin tear to L inner groin, healing appropriately. Cast to RLE  No additional skin breakdown  Reposition q 2hrs    Rehab Goals Patient on target to meet rehab goals: Yes *See Interdisciplinary Assessment and Plan and progress notes for long and short-term goals  Barriers to Discharge: see prior    Possible Resolutions to Barriers:  see prior    Discharge Planning/Teaching Needs:  Home with wife, son and other family to provide 24/7 assistance (wife taking FMLA)      Team Discussion:  Unfortunately pt with little response to plasmaphoresis.  Continues to have issues with BM.  Making good gains despite these issues and remains very motivated.  Family remains involved and education has  been ongoing.  Therapies making DME and follow up recommendations.  Revisions to Treatment Plan:  No changes noted to plan of care this week.   Continued Need for Acute Rehabilitation Level of Care: The patient requires daily medical management by a physician with specialized training in physical medicine and rehabilitation for the following conditions: Daily direction of a multidisciplinary physical rehabilitation program to ensure safe treatment while eliciting the highest outcome that is of practical value to the patient.: Yes Daily medical management of patient  stability for increased activity during participation in an intensive rehabilitation regime.: Yes Daily analysis of laboratory values and/or radiology reports with any subsequent need for medication adjustment of medical intervention for : Neurological problems;Other  Matthew Juarez 05/04/2012, 4:27 PM

## 2012-05-04 NOTE — Progress Notes (Signed)
NEURO HOSPITALIST PROGRESS NOTE   SUBJECTIVE:                                                                                                                        No new neurological complains. He and his wife think that the right leg is " slightly better". Serum NMO-IGG antibodies came back positive. On PF 4/5 days.   OBJECTIVE:                                                                                                                           Vital signs in last 24 hours: Temp:  [97.4 F (36.3 C)-98.6 F (37 C)] 98.2 F (36.8 C) (02/04 0537) Pulse Rate:  [54-73] 61  (02/04 0537) Resp:  [14-19] 18  (02/04 0537) BP: (90-117)/(52-72) 106/58 mmHg (02/04 0700) SpO2:  [94 %-100 %] 98 % (02/04 0537) Weight:  [136 kg (299 lb 13.2 oz)-136.079 kg (300 lb)] 136 kg (299 lb 13.2 oz) (02/03 1856)  Intake/Output from previous day: 02/03 0701 - 02/04 0700 In: 400 [P.O.:400] Out: 300 [Urine:300] Intake/Output this shift:   Nutritional status: Cardiac  Past Medical History  Diagnosis Date  . Chronic back pain   . Hypertension   . Gout   . Headache   . Arthritis     Neurologic ROS negative with exception of above.   Neurologic Exam:  Mental Status:  Alert, oriented, thought content appropriate. Speech fluent without evidence of aphasia. Able to follow 3 step commands without difficulty.  Cranial Nerves:  II: Discs flat bilaterally; Visual fields grossly normal, pupils equal, round, reactive to light and accommodation  III,IV, VI: ptosis not present, extra-ocular motions intact bilaterally  V,VII: smile symmetric, facial light touch sensation normal bilaterally  VIII: hearing normal bilaterally  IX,X: gag reflex present  XI: bilateral shoulder shrug  XII: midline tongue extension  Motor:  Right : Upper extremity 5/5 Left: Upper extremity 5/5  Right hip and knee flexion 3/5 (right <left). right distal leg in cast.  Tone and bulk:normal  tone throughout; no atrophy noted  Sensory: Decreased left leg and right leg above cast.  Deep Tendon Reflexes: 2+ and symmetric throughout UE.  Plantar responses: no tested   Lab Results: No  results found for this basename: cbc, bmp, coags, chol, tri, ldl, hga1c   Lipid Panel No results found for this basename: CHOL,TRIG,HDL,CHOLHDL,VLDL,LDLCALC in the last 72 hours  Studies/Results: No results found.  MEDICATIONS                                                                                                                       I have reviewed the patient's current medications.  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 Failed high dose IV solumedrol x 5 days, and no dramatic improvement on PF 4/5 DAYS. Plan is to complete PF and then decide what to do next depending on clinical improvement. Other options include a repeat cycle of corticosteroids and PF, or using monoclonal antibodies like rituximab. Will follow up with you.    Wyatt Portela ,MD [facility] [phone]  05/04/2012, 2:29 PM

## 2012-05-04 NOTE — Progress Notes (Addendum)
RN checked on patient's HD catheter. Reinforced dressing soaked with blood but not bleeding actively. IV team notified to check on site. Reinforced dressing changed and new one applied. Pressure applied on site with 1 liter bag per IV rn instruction. Will continue to monitor.

## 2012-05-04 NOTE — Progress Notes (Signed)
Social Work Patient ID: Matthew Juarez, male   DOB: Mar 08, 1961, 52 y.o.   MRN: [id number]  Met with pt, wife and son this afternoon to review team conference.  All pleased with continued gains and deny any concerns or questions about d/c pending for next week.  Reviewed DME and HH recommendations per therapists - agreeable with all.  Also agree with therapies that no hospital bed is warranted given his functional gains in transfers.  Will continue to follow.  Lane Kjos, LCSW

## 2012-05-05 ENCOUNTER — Inpatient Hospital Stay (HOSPITAL_COMMUNITY): Payer: BC Managed Care – PPO | Admitting: Physical Therapy

## 2012-05-05 ENCOUNTER — Inpatient Hospital Stay (HOSPITAL_COMMUNITY): Payer: BC Managed Care – PPO

## 2012-05-05 DIAGNOSIS — W19XXXA Unspecified fall, initial encounter: Secondary | ICD-10-CM

## 2012-05-05 DIAGNOSIS — S8263XA Displaced fracture of lateral malleolus of unspecified fibula, initial encounter for closed fracture: Secondary | ICD-10-CM

## 2012-05-05 DIAGNOSIS — G360 Neuromyelitis optica [Devic]: Secondary | ICD-10-CM

## 2012-05-05 DIAGNOSIS — G373 Acute transverse myelitis in demyelinating disease of central nervous system: Secondary | ICD-10-CM

## 2012-05-05 DIAGNOSIS — I803 Phlebitis and thrombophlebitis of lower extremities, unspecified: Secondary | ICD-10-CM

## 2012-05-05 DIAGNOSIS — S82899A Other fracture of unspecified lower leg, initial encounter for closed fracture: Secondary | ICD-10-CM

## 2012-05-05 LAB — COMPREHENSIVE METABOLIC PANEL
Albumin: 4 g/dL (ref 3.5–5.2)
Alkaline Phosphatase: 39 U/L (ref 39–117)
BUN: 16 mg/dL (ref 6–23)
Creatinine, Ser: 0.87 mg/dL (ref 0.50–1.35)
Potassium: 3.7 mEq/L (ref 3.5–5.1)
Total Protein: 5.5 g/dL — ABNORMAL LOW (ref 6.0–8.3)

## 2012-05-05 LAB — CBC
HCT: 32.4 % — ABNORMAL LOW (ref 39.0–52.0)
MCHC: 34.3 g/dL (ref 30.0–36.0)
Platelets: 142 10*3/uL — ABNORMAL LOW (ref 150–400)
RDW: 13.2 % (ref 11.5–15.5)

## 2012-05-05 MED ORDER — ACD FORMULA A 0.73-2.45-2.2 GM/100ML VI SOLN
Status: AC
  Administered 2012-05-05: 500 mL via INTRAVENOUS
  Filled 2012-05-05: qty 500

## 2012-05-05 MED ORDER — ACETAMINOPHEN 325 MG PO TABS: 650.0000 mg | ORAL_TABLET | ORAL | Status: DC | PRN

## 2012-05-05 MED ORDER — CALCIUM CARBONATE ANTACID 500 MG PO CHEW
2.0000 | CHEWABLE_TABLET | ORAL | Status: AC
  Administered 2012-05-05 (×2): 400 mg via ORAL
  Filled 2012-05-05 (×2): qty 2

## 2012-05-05 MED ORDER — CALCIUM GLUCONATE 10 % IV SOLN: 2.0000 g | INTRAVENOUS | Status: DC | PRN

## 2012-05-05 MED ORDER — SODIUM CHLORIDE 0.9 % IV SOLN
INTRAVENOUS | Status: AC
  Filled 2012-05-05 (×4): qty 200

## 2012-05-05 MED ORDER — CALCIUM CARBONATE ANTACID 500 MG PO CHEW
2.0000 | CHEWABLE_TABLET | ORAL | Status: DC
  Filled 2012-05-05 (×2): qty 2

## 2012-05-05 MED ORDER — HEPARIN SODIUM (PORCINE) 1000 UNIT/ML IJ SOLN
1000.0000 [IU] | Freq: Once | INTRAMUSCULAR | Status: DC
  Filled 2012-05-05: qty 1

## 2012-05-05 MED ORDER — SODIUM CHLORIDE 0.9 % IV SOLN
4.0000 g | INTRAVENOUS | Status: DC | PRN
  Administered 2012-05-05: 4 g via INTRAVENOUS
  Filled 2012-05-05 (×2): qty 40

## 2012-05-05 MED ORDER — ACD FORMULA A 0.73-2.45-2.2 GM/100ML VI SOLN
500.0000 mL | Status: DC
  Administered 2012-05-05: 500 mL via INTRAVENOUS
  Filled 2012-05-05 (×3): qty 500

## 2012-05-05 MED ORDER — SODIUM CHLORIDE 0.9 % IV SOLN: 2.0000 g | INTRAVENOUS | Status: DC | PRN

## 2012-05-05 MED ORDER — DIPHENHYDRAMINE HCL 25 MG PO CAPS: 25.0000 mg | ORAL_CAPSULE | Freq: Four times a day (QID) | ORAL | Status: DC | PRN

## 2012-05-05 MED ORDER — HEPARIN SODIUM (PORCINE) 1000 UNIT/ML IJ SOLN
1000.0000 [IU] | Freq: Once | INTRAMUSCULAR | Status: AC
  Administered 2012-05-05: 1000 [IU]
  Filled 2012-05-05: qty 1

## 2012-05-05 MED ORDER — CALCIUM CARBONATE ANTACID 500 MG PO CHEW
CHEWABLE_TABLET | ORAL | Status: AC
  Administered 2012-05-05: 400 mg via ORAL
  Filled 2012-05-05: qty 4

## 2012-05-05 MED ORDER — ACD FORMULA A 0.73-2.45-2.2 GM/100ML VI SOLN
Status: AC
  Filled 2012-05-05: qty 500

## 2012-05-05 MED ORDER — CALCIUM CARBONATE ANTACID 500 MG PO CHEW
2.0000 | CHEWABLE_TABLET | ORAL | Status: DC | PRN
  Filled 2012-05-05: qty 2

## 2012-05-05 MED ORDER — SODIUM CHLORIDE 0.9 % IV SOLN
INTRAVENOUS | Status: AC
  Administered 2012-05-05 (×4): via INTRAVENOUS_CENTRAL
  Filled 2012-05-05 (×4): qty 200

## 2012-05-05 NOTE — Progress Notes (Signed)
Patient ID: Matthew Juarez, male   DOB: April 21, 1960, 52 y.o.   MRN: [id number] Subjective/Complaints: No new problems. Denies pain    A 12 point review of systems has been performed and if not noted above is otherwise negative.   Objective: Vital Signs: Blood pressure 103/40, pulse 59, temperature 98.1 F (36.7 C), temperature source Oral, resp. rate 20, height 6\' 5"  (1.956 m), weight 136 kg (299 lb 13.2 oz), SpO2 100.00%. No results found.  Basename 05/03/12 1709 05/03/12 0940 05/02/12 0925  WBC -- 4.7 5.2  HGB 11.6* 11.6* --  HCT 34.0* 34.2* --  PLT -- 140* 134*    Basename 05/03/12 1715 05/03/12 1709  NA 136 139  K 3.6 3.6  CL 103 103  GLUCOSE 97 97  BUN 12 11  CREATININE 0.85 0.90  CALCIUM 9.3 --   CBG (last 3)  No results found for this basename: GLUCAP:3 in the last 72 hours  Wt Readings from Last 3 Encounters:  05/03/12 136 kg (299 lb 13.2 oz)  04/17/12 133.6 kg (294 lb 8.6 oz)  12/05/11 139.708 kg (308 lb)    Physical Exam:  Constitutional: He is oriented to person, place, and time. He appears well-developed.  HENT:  Head: Normocephalic.  Eyes:  Pupils round and reactive to light  Neck: Normal range of motion. Neck supple. No thyromegaly present.  Cardiovascular: Normal rate and regular rhythm. No murmurs  Pulmonary/Chest: Effort normal and breath sounds normal. No respiratory distress.  Abdominal: Soft. Bowel sounds are normal. He exhibits no distension. There is no tenderness.  Musculoskeletal:  Right ankle in splint  Neurological: He is alert and oriented to person, place, and time.  Follows full commands. T8 sensory level. Trace to absent sensation on the left below level, slightly diminished sense to PP/LT on right.(does have paresthesias) RLE remains 1 prox to tr-1 distally at the ankle. LLE is 3+ to 4/5 proximal to distal. UE's are 5/5. Cognitively intact. DTR's are 2+. Cast in place  Skin: thigh breakdown areas healing. No new areas.   Psychiatric: He  has a normal mood and affect. His behavior is normal. Judgment and thought content normal       Assessment/Plan: 1. Functional deficits secondary to cervico-thoracic transverse myelitis (clinically T8 level) with Brown-Sequard presentation which require 3+ hours per day of interdisciplinary therapy in a comprehensive inpatient rehab setting. Physiatrist is providing close team supervision and 24 hour management of active medical problems listed below. Physiatrist and rehab team continue to assess barriers to discharge/monitor patient progress toward functional and medical goals.  No appreciable neurological improvement. Does have a better sense of bowel control   FIM: FIM - Bathing Bathing Steps Patient Completed: Chest;Right Arm;Left Arm;Abdomen;Front perineal area;Buttocks;Right upper leg;Left upper leg;Left lower leg (including foot) Bathing: 0: Activity did not occur  FIM - Upper Body Dressing/Undressing Upper body dressing/undressing steps patient completed: Thread/unthread right sleeve of pullover shirt/dresss;Thread/unthread left sleeve of pullover shirt/dress;Put head through opening of pull over shirt/dress;Pull shirt over trunk Upper body dressing/undressing: 5: Set-up assist to: Obtain clothing/put away FIM - Lower Body Dressing/Undressing Lower body dressing/undressing steps patient completed: Thread/unthread right pants leg;Thread/unthread left pants leg;Pull pants up/down;Fasten/unfasten pants;Don/Doff left shoe;Fasten/unfasten left shoe Lower body dressing/undressing: 5: Set-up assist to: Obtain clothing  FIM - Toileting Toileting steps completed by patient: Adjust clothing prior to toileting;Performs perineal hygiene;Adjust clothing after toileting Toileting: 0: Activity did not occur  FIM - Diplomatic Services operational officer Devices: Bedside commode;Sliding board (Drop Arm BSC) Toilet  Transfers: 0-Activity did not occur  FIM - Event organiser Devices: Bed rails;Arm rests Bed/Chair Transfer: 6: Supine > Sit: No assist;4: Bed > Chair or W/C: Min A (steadying Pt. > 75%);4: Chair or W/C > Bed: Min A (steadying Pt. > 75%)  FIM - Locomotion: Wheelchair Distance: 150 Locomotion: Wheelchair: 6: Travels 150 ft or more, turns around, maneuvers to table, bed or toilet, negotiates 3% grade: maneuvers on rugs and over door sills independently FIM - Locomotion: Ambulation Ambulation/Gait Assistance: Not tested (comment) Locomotion: Ambulation: 0: Activity did not occur  Comprehension Comprehension Mode: Auditory Comprehension: 5-Understands basic 90% of the time/requires cueing < 10% of the time  Expression Expression Mode: Verbal Expression: 5-Expresses basic 90% of the time/requires cueing < 10% of the time.  Social Interaction Social Interaction: 7-Interacts appropriately with others - No medications needed.  Problem Solving Problem Solving: 5-Solves complex 90% of the time/cues < 10% of the time  Memory Memory: 5-Recognizes or recalls 90% of the time/requires cueing < 10% of the time  Medical Problem List and Plan:  1. cervico-thoracic transverse myelitis, T8 with Brown-Squard clinical presentation.   -neurology plan noted  -plasmapheresis 4/5 completed  -INR to follow up on cath today.  -labwork looking ok  -MRI essentially unremarkable 2. Right distal fibular fracture. Patient is nonweightbearing. In cast. xr still shows distracted fragments- keep NWB pending ortho follow up 3. DVT Prophylaxis/Anticoagulation: right popliteal DVT-not present on follow up doppler. Will continue treatment dose lovenox this week then reduce to prophylactic dose. 4. Pain Management: Hydrocodone as needed. Monitor with increased activity   - neurontin to 300 tid which has helped dysesthesias 5. Neuropsych: This patient is capable of making decisions on his/her own behalf.  6. Hypertension. Norvasc 5 mg daily, Avapro 150 mg  daily. Monitor with increased activity. Normotensive at present  7. History of gout. Allopurinol daily. Monitor for any flareups  8. Prerenal azotemia: pushing liquids. Follow up labs pending 9.  Thigh wound. Pressure relief. Bowel program. Allevyn, epbc 10. Neurogenic bowel: has better sense of bowels needing to move. Bowel program working LOS (Days) 15 A FACE TO FACE EVALUATION WAS PERFORMED  Breeze Angell T 05/05/2012 6:58 AM

## 2012-05-05 NOTE — Progress Notes (Signed)
Occupational Therapy Session Note  Patient Details  Name: Matthew Juarez MRN: [id number] Date of Birth: [birth year]  Today's Date: 05/05/2012  Session 1 Time: 1000-1100 Time Calculation (min): 60 min  Short Term Goals: Week 2:  OT Short Term Goal 1 (Week 2): Pt will perform Drop Arm BSC transfers to uneven surface with supervision OT Short Term Goal 2 (Week 2): Pt will perform toileting tasks with min A OT Short Term Goal 3 (Week 2): Pt will perform tub bench transfer with supervision  Skilled Therapeutic Interventions/Progress Updates:    Pt seated in w/c upon arrival and agreeable to completing bathing and dressing tasks seated in w/c.  Pt stated he had bowel movement earlier and nursing had cleaned him up and changed his brief.  Pt pulled pants over hips utilizing lateral leans while seated in w/c.  After patient completed grooming tasks pt demonstrated ability to place w/c leg rests on w/c and position RLE onto leg rest.  Pt rolled to ADL apartment to practice tub bench transfers at supervision level.  Pt educated in using rolled up towel under right knee to assist with lifting RLE onto leg rest.  When patient unweighted his right leg he exhibited sligh knee extension.  Pt transitioned to gym for UE therex on UE ergonometer.  Therapy Documentation Precautions:  Precautions Precautions: Fall Precaution Comments: R leg weakness, but + sensation, left leg lack of coordination and no sensation. Required Braces or Orthoses: Other Brace/Splint Other Brace/Splint: R foot casted Restrictions Weight Bearing Restrictions: Yes RLE Weight Bearing: Non weight bearing LLE Weight Bearing: Weight bearing as tolerated Pain: Pain Assessment Pain Assessment: No/denies pain Pain Score: 0-No pain  See FIM for current functional status  Therapy/Group: Individual Therapy  Time: 1330-1400 Pt denies pain Individual Therapy Pt's wife and son present for therapy.  Pt practiced w/c to bed transfers  inclduing w/c set up and bed mobility.  Pt exhibited difficulty with placing leg rest on w/c and required assistance.  Pt continues to demonstrate increased ability to place RLE onto leg rest.  Pt continues to required min verbal cues for RLE positioning with transfers.  Focus on activity tolerance, safety awareness, transfers, w/c set up, and bed mobility.  Lavone Neri [facility] 05/05/2012, 11:05 AM

## 2012-05-05 NOTE — Progress Notes (Signed)
Physical Therapy Session Note  Patient Details  Name: Matthew Juarez MRN: [id number] Date of Birth: July 11, 1960  Today's Date: 05/05/2012 Time:Session #1:  4098-1191, Session #2: [id number] Time Calculation (min): Session #1: 63 min, Session #2: 64 min  Short Term Goals: Week 3:  PT Short Term Goal 1 (Week 3): STGs=LTGs  Skilled Therapeutic Interventions/Progress Updates:    Session #1: This session focused on pt becoming more independent with his WC parts.  WC mobility mod I >150' to and from treatment area.  Bed mobility supine to sit from hospital bed mod I with railing, able to manage legs independently.  High to low transfer from hospital bed to [facility] supervision to his left side.  Car transfer to 24.5" high car supervision into car on his left (scoot transfer), min assist out of his car to the right to stabilize WC and help with slide board placement.  In ADL apartment furniture transfers to lower couch scoot pivot to the left supervision.  Slide board transfer back off of low couch min assist to help get pt up incline on board to chair.  Much time spent educating pt on how to make equipment management easier, what tools he will need readily available (reacher, slide board-so we put a bag on the back of his WC), making sure not to lean too far forward while working with legs and leg rests as to not tip the WC forward.  We are also working on therapist not stabilizing the WC to see if he can transfer safely without the WC moving away from the pt.    Session #2: This session, again, focused on pt being able to manage his own leg and WC parts safely and efficiently without outside assistance.  He needed verbal cueing for efficiency and safety at times due to anteriorly or posteriorly tipping WC.  WC mobility >150'x2 to and from gym for endurance and strength training as well as practice due to this will be his primary form of transportation around his home.  Transfers to couch (lower) from [facility] supervision  scoot transfer.  Low couch to [facility] with verbal cues for technique and safety supervision using slide board to the right.  WC to bed transfer (higher bed) to left min assist to keep WC from moving.  Bed to [facility] supervision with verbal cues for safety.  Attempted stand x 3 from high bed.  Successful, but not safe x 1 with one person assist just stabilizing the RW, not the left foot or knee.  X 2 with safer technique of blocking left foot from going posteriorly upon initial push to stand and blocking left knee pt was unable to get on his feet and just ended up sliding forward on the bed.  I spoke with pt and his wife and do not believe that it is safe at this time for them to practice standing at home just the two of them.  I will defer trying to get the two of them safe with practicing this on their own to the HHPT.  Transfer from [facility] to high mat table towards the left min assist to stabilize the WC to keep it from moving.  Sit to stand from elevated mat table with 2 person mod assist x 47 seconds.  Transfer back to [facility] from high mat table close supervision, verbal cues for safety.  Educated pt that he does not have to take both leg rests off every time he transfers and this may save him some time  and energy.  Extra time needed during the session for pt to manage his own WC parts, but with extra time and some frustration he was able to do so.     Therapy Documentation Precautions:  Precautions Precautions: Fall Precaution Comments: R leg weakness, but + sensation, left leg lack of coordination and no sensation. Required Braces or Orthoses: Other Brace/Splint Other Brace/Splint: R foot casted Restrictions Weight Bearing Restrictions: Yes RLE Weight Bearing: Non weight bearing LLE Weight Bearing: Weight bearing as tolerated   Vital Signs: Therapy Vitals BP: 124/68 mmHg Pain: Pain Assessment Pain Assessment: No/denies pain Locomotion : Wheelchair Mobility Distance: 150   See FIM for current functional  status  Therapy/Group: Individual Therapy  Lurena Joiner B. Selam Pietsch, PT, DPT [phone]  05/05/2012, 12:13 PM

## 2012-05-05 NOTE — Progress Notes (Signed)
NEURO HOSPITALIST PROGRESS NOTE   SUBJECTIVE:                                                                                                                        Patient is improving with left leg strength. Right leg is still weak however he has a cast below knee. Last PLX today  OBJECTIVE:                                                                                                                           Vital signs in last 24 hours: Temp:  [98.1 F (36.7 C)-98.9 F (37.2 C)] 98.1 F (36.7 C) (02/05 0536) Pulse Rate:  [59-79] 59  (02/05 0536) Resp:  [18-20] 20  (02/05 0536) BP: (103-124)/(40-68) 124/68 mmHg (02/05 0919) SpO2:  [96 %-100 %] 100 % (02/05 0536)  Intake/Output from previous day: 02/04 0701 - 02/05 0700 In: 680 [P.O.:680] Out: 600 [Urine:600] Intake/Output this shift: Total I/O In: 360 [P.O.:360] Out: -  Nutritional status: Cardiac  Past Medical History  Diagnosis Date  . Chronic back pain   . Hypertension   . Gout   . Headache   . Arthritis       Neurologic Exam:  Mental Status: Alert, oriented, thought content appropriate.  Speech fluent without evidence of aphasia.  Able to follow 3 step commands without difficulty. Cranial Nerves: II: visual fields grossly normal, pupils equal, round, reactive to light and accommodation III,IV, VI: ptosis not present, extraocular muscles extra-ocular motions intact bilaterally V,VII: smile symmetric, facial light touch sensation normal bilaterally VIII: hearing normal bilaterally IX,X: gag reflex present XI: trapezius strength/neck flexion strength normal bilaterally XII: tongue strength normal  Motor: Right : Upper extremity    Left:     Upper extremity 5/5 deltoid       5/5 deltoid 5/5 tricep      5/5 tricep 5/5 biceps      5/5 biceps  5/5wrist flexion     5/5 wrist flexion 5/5 wrist extension     5/5 wrist extension 5/5 hand grip      5/5 hand grip  Lower  extremity     Lower extremity 2/5 hip flexor      5/5 hip flexor  2/5 hip adductors     5/5 hip adductors 2/5 hip abductors     5/5 hip abductors 1/5 quadricep      5/5 quadriceps  1/5 hamstrings     5/5 hamstrings (cast) Plantar flexion     5/5 plantar flexion (cast) Plantar extension    5/5 plantar extension Tone and bulk:normal tone throughout; no atrophy noted Sensory: Pinprick and light touch intact throughout, bilaterally Deep Tendon Reflexes:  Right: Upper Extremity   Left: Upper extremity   biceps (C-5 to C-6) 2/4   biceps (C-5 to C-6) 2/4 tricep (C7) 2/4    triceps (C7) 2/4 Brachioradialis (C6) 2/4  Brachioradialis (C6) 2/4  Lower Extremity Lower Extremity  quadriceps (L-2 to L-4) 1/4   quadriceps (L-2 to L-4) 1/4 Achilles (S1) 2/4   Achilles (S1) 1/4 Plantars: Right: cast   Left: downgoing Cerebellar: normal finger-to-nose,  Lab Results: No results found for this basename: cbc, bmp, coags, chol, tri, ldl, hga1c   Lipid Panel No results found for this basename: CHOL,TRIG,HDL,CHOLHDL,VLDL,LDLCALC in the last 72 hours  Studies/Results: No results found.  MEDICATIONS                                                                                                                        Scheduled:   . therapeutic plasma exchange solution   Dialysis Q1 Hr x 4  . allopurinol  300 mg Oral Daily  . amLODipine  5 mg Oral Daily   And  . irbesartan  150 mg Oral Daily  . bisacodyl  10 mg Rectal Q0600  . calcium gluconate  2 g Intravenous Once  . enoxaparin (LOVENOX) injection  100 mg Subcutaneous Q12H  . gabapentin  300 mg Oral TID  . gelatin adsorbable  1 each Topical Once  . pantoprazole  40 mg Oral Q1200  . saccharomyces boulardii  250 mg Oral BID  . senna-docusate  2 tablet Oral QHS  . sodium phosphate  1 enema Rectal Once    ASSESSMENT/PLAN:                                                                                                               Patient  Active Hospital Problem List: Transverse myelitis NMO antibody positive(04/21/2012)   Assessment: Patient is improving with LE strength, especially on the left leg. Right leg is impaired due to right BK cast. Today patient will receive his last PLX. NMO antibodies have returned in a positive result. Would hold on any further treatment while in  [facility] and patient desires to follow up with [facility] as a out patient.  I have spoken with Mannie Stabile and he is looking into making that appointment.    Plan: Finish PLX--Last dose today.    Assessment and plan discussed with with attending physician and they are in agreement.    Felicie Morn PA-C [facility] [phone]  05/05/2012, 11:42 AM

## 2012-05-05 NOTE — Progress Notes (Signed)
TPE complete, tolerated well without issue.

## 2012-05-06 ENCOUNTER — Inpatient Hospital Stay (HOSPITAL_COMMUNITY): Payer: BC Managed Care – PPO

## 2012-05-06 ENCOUNTER — Inpatient Hospital Stay (HOSPITAL_COMMUNITY): Payer: BC Managed Care – PPO | Admitting: Physical Therapy

## 2012-05-06 LAB — CBC
MCH: 29.8 pg (ref 26.0–34.0)
MCHC: 34.5 g/dL (ref 30.0–36.0)
MCV: 86.2 fL (ref 78.0–100.0)
Platelets: 125 10*3/uL — ABNORMAL LOW (ref 150–400)
RBC: 3.63 MIL/uL — ABNORMAL LOW (ref 4.22–5.81)

## 2012-05-06 LAB — POCT I-STAT, CHEM 8
BUN: 15 mg/dL (ref 6–23)
Creatinine, Ser: 1 mg/dL (ref 0.50–1.35)
Potassium: 3.6 mEq/L (ref 3.5–5.1)
Sodium: 139 mEq/L (ref 135–145)

## 2012-05-06 LAB — CREATININE, SERUM: Creatinine, Ser: 0.84 mg/dL (ref 0.50–1.35)

## 2012-05-06 NOTE — Progress Notes (Signed)
Occupational Therapy Session Note  Patient Details  Name: Matthew Juarez MRN: [id number] Date of Birth: 1960/10/02  Today's Date: 05/06/2012  Session 1 Time: 0900-0956 Time Calculation (min): 56 min  Short Term Goals: Week 2:  OT Short Term Goal 1 (Week 2): Pt will perform Drop Arm BSC transfers to uneven surface with supervision OT Short Term Goal 2 (Week 2): Pt will perform toileting tasks with min A OT Short Term Goal 3 (Week 2): Pt will perform tub bench transfer with supervision  Skilled Therapeutic Interventions/Progress Updates:    Pt initially engaged in bed mobility (rolling side to side) to facilitate therapist and nursing to clean bowel movement in bed.  Pt had called for bed pan but bowels moved before staff could assist.  Pt had suppository 2 hours earlier and had not moved bowels until beginning of therapy.  Pt required assistance only with positioning RLE when rolling to left side.  Pt sat EOB and completed bathing and dressing tasks with supervision only.  Pt completed grooming tasks seated in w/c at sink after scoot transfer to w/c.  Focus on bed mobility, sitting balance, transfers, activity tolerance, and safety awareness.  Pt continues to require occasional verbal cues for LLE positioning during transfers.  Therapy Documentation Precautions:  Precautions Precautions: Fall Precaution Comments: R leg weakness, but + sensation, left leg lack of coordination and no sensation. Required Braces or Orthoses: Other Brace/Splint Other Brace/Splint: R foot casted Restrictions Weight Bearing Restrictions: Yes RLE Weight Bearing: Non weight bearing LLE Weight Bearing: Weight bearing as tolerated  Pain:  Pt c/o discomfort in right ankle (unrated); RN aware; repositioned  See FIM for current functional status  Therapy/Group: Individual Therapy  Session 2 Time: 1345-1430 Pt c/o discomfort in rectum (residual discomfort from morning BM); repositioning and education on pressure  relief when seated in w/c Individual therapy  Pt in bed resting upon arrival with wife at bedside.  Pt stated his rectum was sore from morning bowel movement.  Discussed importance of bowel program after discharge and wife agreed to come in early tomorrow morning to begin education with nursing regarding bowel program.  Discussed with nursing the plan and RN agreed and stated she would pass on during report.  Pt practiced drop arm BSC transfers with and without sliding board.  Pt practiced clothing management seated on BSC.  Pt transitioned to gym for UE therex with 3 kg weighted ball.    Lavone Neri [facility] 05/06/2012, 9:59 AM

## 2012-05-06 NOTE — Progress Notes (Signed)
Physical Therapy Session Note  Patient Details  Name: Matthew Juarez MRN: [id number] Date of Birth: 07/19/1960  Today's Date: 05/06/2012 Time:1458-1530  Mins: 32 mins Short Term Goals: Week 3:  PT Short Term Goal 1 (Week 3): STGs=LTGs  Skilled Therapeutic Interventions/Progress Updates:    This session focused on WC mobility to and from his room mod I using bil upper extremities to propel WC. Pt able to line up WC for transfer and manage all parts (except for armrest due to back latch) independently.  He even remembered education on not removing his right leg rest unless it was going to impede his transfer (due to the fact that it is very effortful for him to get this leg rest on/off).  Transfer to the left from low WC to high 24" mat table supervision.  Sit to stand trials from elevated mat table with RW 2 person mod assist one stabilizing RW and one helping to block and control left knee and foot (foot still likes to jump posteriorly on first effortful push to stand).  Standing trials x 4 with max stand time 1 min 6 seconds.  Transfer from mat table back to [facility] high to low transfer with min assist due to pt unable to make good contact with the floor on his left leg (decreaed coordination of left leg, slick tiled surface, and elevated height of mat table all played into this difficulty).  Pt again, managed his own WC parts and propelled himself back to his room mod I >150'.  Our next challenge is to practice floor transfer with him due to high fall risk once home.  Wife and pt are agreeable this would be beneficial to them.    Therapy Documentation Precautions:  Precautions Precautions: Fall Precaution Comments: R leg weakness, but + sensation, left leg lack of coordination and no sensation. Required Braces or Orthoses: Other Brace/Splint Other Brace/Splint: R foot casted Restrictions Weight Bearing Restrictions: Yes RLE Weight Bearing: Non weight bearing LLE Weight Bearing: Weight bearing as  tolerated General:   Vital Signs: Therapy Vitals Temp: 97.5 F (36.4 C) Temp src: Oral Pulse Rate: 63  Resp: 19  BP: 110/56 mmHg Patient Position, if appropriate: Lying Oxygen Therapy SpO2: 100 % O2 Device: None (Room air)  See FIM for current functional status  Therapy/Group: Individual Therapy  Lurena Joiner B. Dorie Ohms, PT, DPT [phone]   05/06/2012, 8:05 AM

## 2012-05-06 NOTE — Progress Notes (Signed)
NEURO HOSPITALIST PROGRESS NOTE   SUBJECTIVE:                                                                                                                         No complaints.  Doing well.  OBJECTIVE:                                                                                                                           Vital signs in last 24 hours: Temp:  [97.5 F (36.4 C)-98.9 F (37.2 C)] 97.5 F (36.4 C) (02/06 5621) Pulse Rate:  [63-80] 63  (02/06 0633) Resp:  [18-22] 19  (02/06 0633) BP: (101-117)/(46-65) 116/60 mmHg (02/06 0914) SpO2:  [97 %-100 %] 100 % (02/06 3086) Weight:  [133.811 kg (295 lb)-133.9 kg (295 lb 3.1 oz)] 133.9 kg (295 lb 3.1 oz) (02/06 0607)  Intake/Output from previous day: 02/05 0701 - 02/06 0700 In: 1080 [P.O.:1080] Out: 550 [Urine:550] Intake/Output this shift: Total I/O In: 720 [P.O.:720] Out: 275 [Urine:275] Nutritional status: Cardiac  Past Medical History  Diagnosis Date  . Chronic back pain   . Hypertension   . Gout   . Headache   . Arthritis      Neurologic Exam:   Mental Status: Alert, oriented, thought content appropriate.  Speech fluent without evidence of aphasia.  Able to follow 3 step commands without difficulty. Cranial Nerves: II: visual fields grossly normal, pupils equal, round, reactive to light and accommodation III,IV, VI: ptosis not present, extraocular muscles extra-ocular motions intact bilaterally V,VII: smile symmetric, facial light touch sensation normal bilaterally VIII: hearing normal bilaterally IX,X: gag reflex present XI: trapezius strength/neck flexion strength normal bilaterally XII: tongue strength normal  Motor: Right : Upper extremity    Left:     Upper extremity 5/5 deltoid       5/5 deltoid 5/5 tricep      5/5 tricep 5/5 biceps      5/5 biceps  5/5wrist flexion     5/5 wrist flexion 5/5 wrist extension     5/5 wrist extension 5/5 hand grip      5/5  hand grip  Lower extremity     Lower extremity 2/5 hip flexor  5/5 hip flexor 3/5 hip adductors     5/5 hip adductors 0/5 hip abductors     5/5 hip abductors 1/5 quadricep      5/5 quadriceps  1/5 hamstrings     5/5 hamstrings (cast) plantar flexion       5/5 plantar flexion (cast plantar extension    5/5 plantar extension Tone and bulk:normal tone throughout; no atrophy noted Sensory: Pinprick and light touch intact throughout, bilaterally Deep Tendon Reflexes:  Right: Upper Extremity   Left: Upper extremity   biceps (C-5 to C-6) 2/4   biceps (C-5 to C-6) 2/4 tricep (C7) 2/4    triceps (C7) 2/4 Brachioradialis (C6) 2/4  Brachioradialis (C6) 2/4  Lower Extremity Lower Extremity  quadriceps (L-2 to L-4) 1/4   quadriceps (L-2 to L-4) 2/4 Achilles (S1) in cast   Achilles (S1) 1/4  Lab Results: No results found for this basename: cbc, bmp, coags, chol, tri, ldl, hga1c   Lipid Panel No results found for this basename: CHOL,TRIG,HDL,CHOLHDL,VLDL,LDLCALC in the last 72 hours  Studies/Results: No results found.  MEDICATIONS                                                                                                                        Scheduled:   . allopurinol  300 mg Oral Daily  . amLODipine  5 mg Oral Daily   And  . irbesartan  150 mg Oral Daily  . bisacodyl  10 mg Rectal Q0600  . calcium gluconate  2 g Intravenous Once  . enoxaparin (LOVENOX) injection  100 mg Subcutaneous Q12H  . gabapentin  300 mg Oral TID  . gelatin adsorbable  1 each Topical Once  . pantoprazole  40 mg Oral Q1200  . saccharomyces boulardii  250 mg Oral BID  . senna-docusate  2 tablet Oral QHS  . sodium phosphate  1 enema Rectal Once    ASSESSMENT/PLAN:                                                                                                               Patient Active Hospital Problem List: Transverse myelitis (04/21/2012)   Assessment: Finished his PLX yesterday--no further PLX  scheduled. Patient has appointment with [facility] as out patient. No further recommendations at this time.  Discussed with Mannie Stabile. Neurology will S/O.  Call with questions.       Assessment and plan discussed with with attending physician and they are in agreement.    Felicie Morn PA-C [facility] [phone]  05/06/2012, 2:40 PM

## 2012-05-06 NOTE — Progress Notes (Signed)
Patient ID: Matthew Juarez, male   DOB: Feb 16, 1961, 52 y.o.   MRN: [id number] Subjective/Complaints: No new problems. Slept well last night. Chart reviewed   A 12 point review of systems has been performed and if not noted above is otherwise negative.   Objective: Vital Signs: Blood pressure 116/60, pulse 63, temperature 97.5 F (36.4 C), temperature source Oral, resp. rate 19, height 6\' 5"  (1.956 m), weight 133.9 kg (295 lb 3.1 oz), SpO2 100.00%. No results found.  Basename 05/06/12 0715 05/05/12 1820  WBC 4.0 7.2  HGB 10.8* 11.1*  HCT 31.3* 32.4*  PLT 125* 142*    Basename 05/06/12 0715 05/05/12 1820 05/03/12 1715  NA -- 140 136  K -- 3.7 3.6  CL -- 106 103  GLUCOSE -- 98 97  BUN -- 16 12  CREATININE 0.84 0.87 --  CALCIUM -- 9.3 9.3   CBG (last 3)  No results found for this basename: GLUCAP:3 in the last 72 hours  Wt Readings from Last 3 Encounters:  05/06/12 133.9 kg (295 lb 3.1 oz)  04/17/12 133.6 kg (294 lb 8.6 oz)  12/05/11 139.708 kg (308 lb)    Physical Exam:  Constitutional: He is oriented to person, place, and time. He appears well-developed.  HENT:  Head: Normocephalic.  Eyes:  Pupils round and reactive to light  Neck: Normal range of motion. Neck supple. No thyromegaly present.  Cardiovascular: Normal rate and regular rhythm. No murmurs  Pulmonary/Chest: Effort normal and breath sounds normal. No respiratory distress.  Abdominal: Soft. Bowel sounds are normal. He exhibits no distension. There is no tenderness.  Musculoskeletal:  Right ankle in splint  Neurological: He is alert and oriented to person, place, and time.  Follows full commands. T8 sensory level. Trace to absent sensation on the left below level, slightly diminished sense to PP/LT on right.(does have paresthesias) RLE remains 1 prox to tr-1 distally at the ankle. LLE is 3+ to 4/5 proximal to distal. UE's are 5/5. Cognitively intact. DTR's are 2+. Cast in place  Skin: thigh breakdown areas  healing. No new areas.   Psychiatric: He has a normal mood and affect. His behavior is normal. Judgment and thought content normal       Assessment/Plan: 1. Functional deficits secondary to cervico-thoracic transverse myelitis (clinically T8 level) with Brown-Sequard presentation which require 3+ hours per day of interdisciplinary therapy in a comprehensive inpatient rehab setting. Physiatrist is providing close team supervision and 24 hour management of active medical problems listed below. Physiatrist and rehab team continue to assess barriers to discharge/monitor patient progress toward functional and medical goals.  No appreciable neurological improvement. Does have a better sense of bowel control   FIM: FIM - Bathing Bathing Steps Patient Completed: Chest;Right Arm;Left Arm;Abdomen Bathing: 5: Supervision: Safety issues/verbal cues (UB bathing only at w/c level)  FIM - Upper Body Dressing/Undressing Upper body dressing/undressing steps patient completed: Thread/unthread right sleeve of pullover shirt/dresss;Thread/unthread left sleeve of pullover shirt/dress;Put head through opening of pull over shirt/dress;Pull shirt over trunk Upper body dressing/undressing: 5: Set-up assist to: Obtain clothing/put away FIM - Lower Body Dressing/Undressing Lower body dressing/undressing steps patient completed: Thread/unthread right pants leg;Thread/unthread left pants leg;Pull pants up/down;Don/Doff left sock;Don/Doff left shoe;Fasten/unfasten left shoe Lower body dressing/undressing: 5: Supervision: Safety issues/verbal cues (w/c level)  FIM - Toileting Toileting steps completed by patient: Adjust clothing prior to toileting;Performs perineal hygiene;Adjust clothing after toileting Toileting: 0: Activity did not occur  FIM - Diplomatic Services operational officer Devices: Bedside commode;Sliding board (  Drop Arm BSC) Toilet Transfers: 0-Activity did not occur  FIM - Physiological scientist Devices: Bed rails;Arm rests Bed/Chair Transfer: 6: Supine > Sit: No assist;5: Bed > Chair or W/C: Supervision (verbal cues/safety issues);4: Chair or W/C > Bed: Min A (steadying Pt. > 75%)  FIM - Locomotion: Wheelchair Distance: 150 Locomotion: Wheelchair: 6: Travels 150 ft or more, turns around, maneuvers to table, bed or toilet, negotiates 3% grade: maneuvers on rugs and over door sills independently FIM - Locomotion: Ambulation Ambulation/Gait Assistance: Not tested (comment) Locomotion: Ambulation: 0: Activity did not occur  Comprehension Comprehension Mode: Auditory Comprehension: 5-Understands complex 90% of the time/Cues < 10% of the time  Expression Expression Mode: Verbal Expression: 5-Expresses complex 90% of the time/cues < 10% of the time  Social Interaction Social Interaction: 7-Interacts appropriately with others - No medications needed.  Problem Solving Problem Solving: 5-Solves basic 90% of the time/requires cueing < 10% of the time  Memory Memory: 5-Recognizes or recalls 90% of the time/requires cueing < 10% of the time  Medical Problem List and Plan:  1. cervico-thoracic transverse myelitis, T8 with Brown-Squard clinical presentation. NMO ab positive  -no further treatment while on rehab. Appt made at [facility] by Deatra Ina, PA-C  -plasmapheresis 5/5 completed  -MRI essentially unremarkable  -minimal neuro progress at this point  -pt has remained motivated with therapies 2. Right distal fibular fracture. Patient is nonweightbearing. In cast. xr still shows distracted fragments- keep NWB pending ortho follow up 3. DVT Prophylaxis/Anticoagulation: right popliteal DVT-not present on follow up doppler. Will continue treatment dose lovenox this week then reduce to prophylactic dose. 4. Pain Management: Hydrocodone as needed. Monitor with increased activity   - neurontin to 300 tid which has helped dysesthesias 5.  Neuropsych: This patient is capable of making decisions on his/her own behalf.  6. Hypertension. Norvasc 5 mg daily, Avapro 150 mg daily. Monitor with increased activity. Normotensive at present  7. History of gout. Allopurinol daily. Monitor for any flareups  8. Prerenal azotemia: pushing liquids. Follow up labs pending 9.  Thigh wound. Pressure relief. Bowel program. Allevyn, epbc 10. Neurogenic bowel: has better sense of bowels needing to move. Bowel program working better  -continent of bladder LOS (Days) 16 A FACE TO FACE EVALUATION WAS PERFORMED  Brynna Dobos T 05/06/2012 9:50 AM

## 2012-05-06 NOTE — Progress Notes (Signed)
Physical Therapy Note  Patient Details  Name: Matthew Juarez MRN: [id number] Date of Birth: January 09, 1961 Today's Date: 05/06/2012  Time: 1100-1200 60 minutes  No c/o pain.  W/c mobility throughout unit, on/off elevator, in home setting with mod I for propulsion, supervision cuing for parts management.  Treatment focused on w/c set up for safe transfers to a variety of surfaces.  Pt required increased time but able to problem solve safe set up for transfers to bed in ADL apartment and sofa in solarium.  Pt able to perform w/c <> bed transfer with supervision, bed mobility supervision using towel roll to lift R LE.  Couch transfer with supervision downhill, mod A uphill transfer back to w/c.  Discussed with pt/wife raising furniture or choosing higher furniture to sit in to decrease burden of care.  Individual therapy   Edynn Gillock 05/06/2012, 12:25 PM

## 2012-05-07 ENCOUNTER — Inpatient Hospital Stay (HOSPITAL_COMMUNITY): Payer: BC Managed Care – PPO | Admitting: Physical Therapy

## 2012-05-07 ENCOUNTER — Inpatient Hospital Stay (HOSPITAL_COMMUNITY): Payer: BC Managed Care – PPO

## 2012-05-07 ENCOUNTER — Inpatient Hospital Stay (HOSPITAL_COMMUNITY): Payer: BC Managed Care – PPO | Admitting: *Deleted

## 2012-05-07 DIAGNOSIS — I803 Phlebitis and thrombophlebitis of lower extremities, unspecified: Secondary | ICD-10-CM

## 2012-05-07 DIAGNOSIS — G373 Acute transverse myelitis in demyelinating disease of central nervous system: Secondary | ICD-10-CM

## 2012-05-07 DIAGNOSIS — W19XXXA Unspecified fall, initial encounter: Secondary | ICD-10-CM

## 2012-05-07 DIAGNOSIS — S8263XA Displaced fracture of lateral malleolus of unspecified fibula, initial encounter for closed fracture: Secondary | ICD-10-CM

## 2012-05-07 NOTE — Progress Notes (Signed)
Patient ID: Matthew Juarez, male   DOB: 12-10-1960, 52 y.o.   MRN: [id number] Subjective/Complaints: No new issues. Up on commode per bowel program.   A 12 point review of systems has been performed and if not noted above is otherwise negative.   Objective: Vital Signs: Blood pressure 106/60, pulse 63, temperature 96.6 F (35.9 C), temperature source Oral, resp. rate 18, height 6\' 5"  (1.956 m), weight 132.5 kg (292 lb 1.8 oz), SpO2 96.00%. No results found.  Basename 05/06/12 0715 05/05/12 1820  WBC 4.0 7.2  HGB 10.8* 11.1*  HCT 31.3* 32.4*  PLT 125* 142*    Basename 05/06/12 0715 05/05/12 1820 05/05/12 1817  NA -- 140 139  K -- 3.7 3.6  CL -- 106 105  GLUCOSE -- 98 96  BUN -- 16 15  CREATININE 0.84 0.87 --  CALCIUM -- 9.3 --   CBG (last 3)  No results found for this basename: GLUCAP:3 in the last 72 hours  Wt Readings from Last 3 Encounters:  05/07/12 132.5 kg (292 lb 1.8 oz)  04/17/12 133.6 kg (294 lb 8.6 oz)  12/05/11 139.708 kg (308 lb)    Physical Exam:  Constitutional: He is oriented to person, place, and time. He appears well-developed.  HENT:  Head: Normocephalic.  Eyes:  Pupils round and reactive to light  Neck: Normal range of motion. Neck supple. No thyromegaly present.  Cardiovascular: Normal rate and regular rhythm. No murmurs  Pulmonary/Chest: Effort normal and breath sounds normal. No respiratory distress.  Abdominal: Soft. Bowel sounds are normal. He exhibits no distension. There is no tenderness.  Musculoskeletal:  Right ankle in splint  Neurological: He is alert and oriented to person, place, and time.  Follows full commands. T8 sensory level. Trace to absent sensation on the left below level, slightly diminished sense to PP/LT on right.(does have paresthesias) RLE remains 1 prox to tr-1 distally at the ankle. LLE is 3+ to 4/5 proximal to distal. UE's are 5/5. Cognitively intact. DTR's are 2+. Cast in place  Skin: thigh breakdown areas healing. No  new areas.   Psychiatric: He has a normal mood and affect. His behavior is normal. Judgment and thought content normal       Assessment/Plan: 1. Functional deficits secondary to cervico-thoracic transverse myelitis (clinically T8 level) with Brown-Sequard presentation which require 3+ hours per day of interdisciplinary therapy in a comprehensive inpatient rehab setting. Physiatrist is providing close team supervision and 24 hour management of active medical problems listed below. Physiatrist and rehab team continue to assess barriers to discharge/monitor patient progress toward functional and medical goals.  No appreciable neurological improvement. Does have a better sense of bowel control   FIM: FIM - Bathing Bathing Steps Patient Completed: Chest;Right Arm;Left Arm;Abdomen Bathing: 5: Supervision: Safety issues/verbal cues (UB bathing only at w/c level)  FIM - Upper Body Dressing/Undressing Upper body dressing/undressing steps patient completed: Thread/unthread right sleeve of pullover shirt/dresss;Thread/unthread left sleeve of pullover shirt/dress;Put head through opening of pull over shirt/dress;Pull shirt over trunk Upper body dressing/undressing: 5: Set-up assist to: Obtain clothing/put away FIM - Lower Body Dressing/Undressing Lower body dressing/undressing steps patient completed: Thread/unthread right pants leg;Thread/unthread left pants leg;Pull pants up/down;Don/Doff left sock;Don/Doff left shoe;Fasten/unfasten left shoe Lower body dressing/undressing: 5: Supervision: Safety issues/verbal cues (w/c level)  FIM - Toileting Toileting steps completed by patient: Adjust clothing prior to toileting;Performs perineal hygiene;Adjust clothing after toileting Toileting: 0: Activity did not occur  FIM - Diplomatic Services operational officer Devices: Bedside commode;Sliding board (  Drop Arm BSC) Toilet Transfers: 0-Activity did not occur  FIM - Event organiser Devices: Sliding board;Arm rests Bed/Chair Transfer: 4: Bed > Chair or W/C: Min A (steadying Pt. > 75%);5: Chair or W/C > Bed: Supervision (verbal cues/safety issues)  FIM - Locomotion: Wheelchair Distance: 150 Locomotion: Wheelchair: 6: Travels 150 ft or more, turns around, maneuvers to table, bed or toilet, negotiates 3% grade: maneuvers on rugs and over door sills independently FIM - Locomotion: Ambulation Ambulation/Gait Assistance: Not tested (comment) Locomotion: Ambulation: 0: Activity did not occur  Comprehension Comprehension Mode: Auditory Comprehension: 5-Understands basic 90% of the time/requires cueing < 10% of the time  Expression Expression Mode: Verbal Expression: 5-Expresses basic 90% of the time/requires cueing < 10% of the time.  Social Interaction Social Interaction: 7-Interacts appropriately with others - No medications needed.  Problem Solving Problem Solving: 5-Solves basic 90% of the time/requires cueing < 10% of the time  Memory Memory: 5-Recognizes or recalls 90% of the time/requires cueing < 10% of the time  Medical Problem List and Plan:  1. cervico-thoracic transverse myelitis, T8 with Brown-Squard clinical presentation. NMO ab positive  -no further treatment while on rehab. Appt made at [facility] by Deatra Ina, PA-C  -plasmapheresis 5/5 completed  -MRI essentially unremarkable  -minimal neuro progress at this point  -pt has remained motivated with therapies 2. Right distal fibular fracture. Patient is nonweightbearing. In cast. xr still shows distracted fragments- keep NWB  3. DVT Prophylaxis/Anticoagulation: right popliteal DVT-not present on follow up doppler. Will continue treatment dose lovenox this week then reduce to prophylactic dose. 4. Pain Management: Hydrocodone as needed. Monitor with increased activity   - neurontin 300 tid which has helped dysesthesias 5. Neuropsych: This patient is capable of making  decisions on his/her own behalf.  6. Hypertension. Norvasc 5 mg daily, Avapro 150 mg daily. Monitor with increased activity. Normotensive at present  7. History of gout. Allopurinol daily. Monitor for any flareups  8. Prerenal azotemia: pushing liquids. Follow up labs improved 9.  Thigh wound. Pressure relief. Bowel program. Allevyn, epbc 10. Neurogenic bowel: has better sense of bowels needing to move. Bowel program working better  -continent of bladder LOS (Days) 17 A FACE TO FACE EVALUATION WAS PERFORMED  Avleen Bordwell T 05/07/2012 8:06 AM

## 2012-05-07 NOTE — Progress Notes (Signed)
Occupational Therapy Weekly Progress Note  Patient Details  Name: Matthew Juarez MRN: [id number] Date of Birth: September 27, 1960  Today's Date: 05/07/2012  Patient has met 3 of 3 short term goals.  Pt has made excellent progress this past week with bathing, dressing, toilet transfers, tub transfers, and toileting.  A bowel program has been initiated and pt transfers to drop arm BSC with supervision and continues to require steady A with toileting.  Wife and patient agree that they will continue with bowel program in the AM after discharge and wife will always be present.  Wife has been present for numerous therapy sessions and provides appropriate supervision and assistance PRN.   Patient continues to demonstrate the following deficits: LE weakness, and non weight bearing through right lower extremity and therefore will continue to benefit from skilled OT intervention to enhance overall performance with BADL.  Patient progressing toward long term goals..  Continue plan of care.  OT Short Term Goals Week 2:  OT Short Term Goal 1 (Week 2): Pt will perform Drop Arm BSC transfers to uneven surface with supervision OT Short Term Goal 1 - Progress (Week 2): Met OT Short Term Goal 2 (Week 2): Pt will perform toileting tasks with min A OT Short Term Goal 2 - Progress (Week 2): Met OT Short Term Goal 3 (Week 2): Pt will perform tub bench transfer with supervision OT Short Term Goal 3 - Progress (Week 2): Met  Skilled Therapeutic Interventions/Progress Updates:   NA  Therapy Documentation Precautions:  Precautions Precautions: Fall Precaution Comments: R leg weakness, but + sensation, left leg lack of coordination and no sensation. Required Braces or Orthoses: Other Brace/Splint Other Brace/Splint: R foot casted Restrictions Weight Bearing Restrictions: Yes RLE Weight Bearing: Non weight bearing LLE Weight Bearing: Weight bearing as tolerated  Pain: Pain Assessment Pain Assessment: No/denies  pain ADL: ADL Equipment Provided: Reacher;Long-handled sponge Eating: Independent Where Assessed-Eating: Chair Grooming: Independent Where Assessed-Grooming: Sitting at sink Upper Body Bathing: Modified independent Where Assessed-Upper Body Bathing: Edge of bed Lower Body Bathing: Supervision/safety Where Assessed-Lower Body Bathing: Bed level;Edge of bed Upper Body Dressing: Independent Where Assessed-Upper Body Dressing: Edge of bed Lower Body Dressing: Modified independent Where Assessed-Lower Body Dressing: Edge of bed Toileting: Contact guard Where Assessed-Toileting: Bedside Commode Toilet Transfer: Dependent Statistician Method: Scientist, research (life sciences): Drop arm bedside commode Tub/Shower Transfer: Close supervison Tub/Shower Transfer Method: Psychologist, prison and probation services: Transfer tub bench  See FIM for current functional status  Therapy/Group: Individual Therapy  Rich Brave 05/07/2012, 12:14 PM

## 2012-05-07 NOTE — Progress Notes (Signed)
Physical Therapy Session Note  Patient Details  Name: Matthew Juarez MRN: [id number] Date of Birth: Jan 20, 1961  Today's Date: 05/07/2012 Time: 1130-1200 Time Calculation (min): 30 min  Short Term Goals: Week 3: STGs=LTGs  Skilled Therapeutic Interventions/Progress Updates:    Patient received sitting in wheelchair. Today's session focused on L LE strength and coordination and B UE strength. Patient performed L hip flexion and L knee extension 2 sests x10 reps with 4# weight. Patient performed L heel taps with 4# weight and instructed to tap heel inside a ring. Position of ring changed throughout activity to facilitate coordination and accurate placement of L LE (2 sets x10). Patient performed 10 reps of tapping R knee with L heel (no weight). Patient requires min assist for accuracy secondary to slight dysmetria/undershooting.  Four cards with numbers on them placed on floor in front of patient. Patient instructed to tap numbered cards in order that they are called out and not to rest foot on floor in between taps. 3-4 number sequences called out for approximately 2' x2 rounds. Patient continues to demonstrate decreased coordination and dysmetria, greater with eccentric contractions than concentric.  Patient performed 2 sets x10 reps wheelchair pushups to increase B UE strength and assist with transfers.  Patient returned to room and left seated in wheelchair with all needs within reach.   Therapy Documentation Precautions:  Precautions Precautions: Fall Precaution Comments: R leg weakness, but + sensation, left leg lack of coordination and no sensation Required Braces or Orthoses: Other Brace/Splint Other Brace/Splint: R foot casted  Restrictions Weight Bearing Restrictions: Yes RLE Weight Bearing: Non weight bearing LLE Weight Bearing: Weight bearing as tolerated Pain: Pain Assessment Pain Assessment: No/denies pain Pain Score: 0-No pain Locomotion : Ambulation Ambulation/Gait  Assistance: Not tested (comment) Wheelchair Mobility Distance: 150   See FIM for current functional status  Therapy/Group: Individual Therapy  Chipper Herb. Mostafa Yuan, PT, DPT  05/07/2012, 4:04 PM

## 2012-05-07 NOTE — Procedures (Signed)
Successful removal of dialysis catheter.  No immediate complications. 

## 2012-05-07 NOTE — Progress Notes (Signed)
Brief Nutrition Follow up  Diet Regular with 100% intake.  Patient states he is doing well.  Weight is stable compared to admit weight.  Labs and meds reviewed.    No nutrition intervention warranted at this time.  If nutrition issues arise, please consult RD.  Oran Rein, RD, LDN Clinical Inpatient Dietitian Pager:  [phone] Weekend and after hours pager:  [phone]

## 2012-05-07 NOTE — Progress Notes (Signed)
Occupational Therapy Session Note  Patient Details  Name: Matthew Juarez MRN: [id number] Date of Birth: May 15, 1960  Today's Date: 05/07/2012 Time: 0900-0958 Time Calculation (min): 58 min  Short Term Goals: Week 2:  OT Short Term Goal 1 (Week 2): Pt will perform Drop Arm BSC transfers to uneven surface with supervision OT Short Term Goal 2 (Week 2): Pt will perform toileting tasks with min A OT Short Term Goal 3 (Week 2): Pt will perform tub bench transfer with supervision  Skilled Therapeutic Interventions/Progress Updates:    Pt already bathed, dressed, and in w/c upon arrival.  Pt rolled to gym for therex on UE ergonometer before rolling to ADL apartment for simple kitchen tasks and practicing tub transfers with wife providing appropriate assistance and supervision.  Pt/wife reported that after suppository in early AM patient transferred to drop arm BSC from bed to empty bowels.  Wife reported that patient was able to perform all toileting tasks with assistance only to steady patient when performing lateral leans. Pt and wife pleased with patient performance and ability to complete task.  Both agreed that that would be the plan after discharge.    Therapy Documentation Precautions:  Precautions Precautions: Fall Precaution Comments: R leg weakness, but + sensation, left leg lack of coordination and no sensation. Required Braces or Orthoses: Other Brace/Splint Other Brace/Splint: R foot casted Restrictions Weight Bearing Restrictions: Yes RLE Weight Bearing: Non weight bearing LLE Weight Bearing: Weight bearing as tolerated   Pain: Pain Assessment Pain Assessment: No/denies pain  See FIM for current functional status  Therapy/Group: Individual Therapy  Rich Brave 05/07/2012, 9:59 AM

## 2012-05-07 NOTE — Progress Notes (Signed)
Physical Therapy Session Note  Patient Details  Name: Matthew Juarez MRN: [id number] Date of Birth: 03-Jul-1960  Today's Date: 05/07/2012 Time:Session #1:  [id number], Session #2: [id number] Time Calculation (min): Session #1: 52 min, Session #2: 48 min  Short Term Goals: Week 3:  PT Short Term Goal 1 (Week 3): STGs=LTGs  Skilled Therapeutic Interventions/Progress Updates:    Session #1: This session focused on WC mobility and parts management which is starting to get more efficient with practice.  WC mobility to and from his room to gym and back 150' x 2 mod I.  Transfer to floor min assist from [facility] with verbal cues on hand placement and safety to get to floor.  Floor to stool to mat table x2 (stool ~7 inches high, then 4" boost to mat table) 2 person mod assist to get to stool, supervision to get to mat table from stool.  Discussed safety and fall prevention with pt and wife.   Session #2: this session focused on WC mobility to and from room 150' mod I with extra time needed for pt to manage own WC parts.  Transfer (level) WC to mat table to the left supervision, sit to stand from elevated mat table 2 person mod assist one to stabilize RW, one to keep left foot from moving posteriorly during initial push off and to block/support left knee.  Standing endurance x 4 trials with max stand time 2 mins.  Transfer elevated mat table to WC min assist provided by wife to stabilize his WC.  Pt again, needing extra time to manage his own WC parts.      Therapy Documentation Precautions:  Precautions Precautions: Fall Precaution Comments: R leg weakness, but + sensation, left leg lack of coordination and no sensation. Required Braces or Orthoses: Other Brace/Splint Other Brace/Splint: R foot casted Restrictions Weight Bearing Restrictions: Yes RLE Weight Bearing: Non weight bearing LLE Weight Bearing: Weight bearing as tolerated Pain: Pain Assessment Pain Assessment: No/denies pain Locomotion  : Wheelchair Mobility Distance: 150   See FIM for current functional status  Therapy/Group: Individual Therapy  Lurena Joiner B. Daxson Reffett, PT, DPT [phone]   05/07/2012, 10:44 AM

## 2012-05-07 NOTE — Plan of Care (Signed)
Problem: RH PAIN MANAGEMENT Goal: RH STG PAIN MANAGED AT OR BELOW PT'S PAIN GOAL 2/10  Outcome: Progressing Pain controlled with scheduled medication

## 2012-05-08 ENCOUNTER — Inpatient Hospital Stay (HOSPITAL_COMMUNITY): Payer: BC Managed Care – PPO | Admitting: *Deleted

## 2012-05-08 ENCOUNTER — Inpatient Hospital Stay (HOSPITAL_COMMUNITY): Payer: BC Managed Care – PPO | Admitting: Physical Therapy

## 2012-05-08 NOTE — Progress Notes (Signed)
Occupational Therapy Note  Patient Details  Name: Matthew Juarez MRN: [id number] Date of Birth: 1960-05-07 Today's Date: 05/08/2012  1030-1100  ( ) Individual session Pain:  none  Engaged in transfer to high surface mat with supervision.  Pr performed therapeutic exercises with bridging , knee to chest. Straight leg raises, tricep push ups.  Pt tolerated session well without any signs of fatigue.  Propelled wc from room to gym and back independently.      Humberto Seals 05/08/2012, 10:52 AM

## 2012-05-08 NOTE — Plan of Care (Signed)
Problem: RH PAIN MANAGEMENT Goal: RH STG PAIN MANAGED AT OR BELOW PT'S PAIN GOAL 2/10  Outcome: Progressing Pt without complaints of pain at this time

## 2012-05-08 NOTE — Plan of Care (Signed)
Problem: SCI BOWEL ELIMINATION Goal: RH STG SCI MANAGE BOWEL WITH MEDICATION WITH ASSISTANCE STG SCI Manage bowel with medication with min assistance.  Outcome: Progressing Suppository QAM and up to [facility]

## 2012-05-08 NOTE — Progress Notes (Signed)
Physical Therapy Session Note  Patient Details  Name: Matthew Juarez MRN: [id number] Date of Birth: [birth year]  Today's Date: 05/08/2012 Time: 1130-1200 Time Calculation (min): 30 min  Short Term Goals: Week 1:  PT Short Term Goal 1 (Week 1): bed mobility supervision using assistive devices or compensatory techniques to move right leg PT Short Term Goal 1 - Progress (Week 1): Progressing toward goal PT Short Term Goal 2 (Week 1): slide board transfers to both left and right on level surfaces min assist both ways with pt independently placing board.   PT Short Term Goal 2 - Progress (Week 1): Met PT Short Term Goal 3 (Week 1): WC mobility >150' mod I for driving with assist only for parts management.   PT Short Term Goal 3 - Progress (Week 1): Progressing toward goal PT Short Term Goal 4 (Week 1): Sit to stand in parallel bars with two person assist (mod overall level) someone to stabilize chair someone to stabilize pt.   PT Short Term Goal 4 - Progress (Week 1): Met PT Short Term Goal 5 (Week 1): Stand for >5 mins with LRAD and NWB right foot for improved strength, endurance, left leg WB and activity tolerance.  PT Short Term Goal 5 - Progress (Week 1): Progressing toward goal  Therapy Documentation Precautions:  Precautions: Fall Precaution Comments: R leg weakness, but + sensation, left leg lack of coordination and no sensation Required Braces or Orthoses: Other Brace/Splint Other Brace/Splint: R foot casted  Restrictions Weight Bearing Restrictions: Yes RLE Weight Bearing: Non weight bearing LLE Weight Bearing: Weight bearing as tolerated Pain: denies pain  Wheelchair management:(15') S/Independent and propelling chair 2 x 250' Independently Transfer Training:(15') parallel bars sit<->stand with Mod-A to block L knee and ensure NWB on R LE.  Initial standing up with forward lean and flexed Left knee which was blacked back into full extension and verbal cues for sequential "walking"  of hands back to bring him up into full upright standing.  Verbal cues for safety with sitting while blocking Left knee Moderately and ensuring R LE maintained NWB status.  Transfer w/c->mat table toward Left and mat table -> w/c toward right side with S/Independent assist (no slideboard needed).   Patient very aware of ensuring safety prior to transfers w/c<->mat table.     Therapy/Group: Individual Therapy  [name] J 05/08/2012, 11:56 AM

## 2012-05-08 NOTE — Progress Notes (Signed)
Matthew Juarez is a 52 y.o. male 14-May-1960 [id number]  Subjective: No new complaints. No new problems. Feeling well and denies any pain. Improving numbness L thigh  Objective: Vital signs in last 24 hours: Temp:  [97.4 F (36.3 C)-97.6 F (36.4 C)] 97.4 F (36.3 C) (02/08 0635) Pulse Rate:  [59-67] 59 (02/08 0635) Resp:  [18] 18 (02/08 0635) BP: (100-107)/(62-65) 100/62 mmHg (02/08 0635) SpO2:  [98 %-100 %] 98 % (02/08 0635) Weight:  [132 kg (291 lb 0.1 oz)] 132 kg (291 lb 0.1 oz) (02/08 0635) Weight change: -0.5 kg (-1 lb 1.6 oz) Last BM Date: 05/07/12  Intake/Output from previous day: 02/07 0701 - 02/08 0700 In: 720 [P.O.:720] Out: 1375 [Urine:1375] Last cbgs: CBG (last 3)  No results found for this basename: GLUCAP,  in the last 72 hours   Physical Exam General: No apparent distress    Lungs: Normal effort. Lungs clear to auscultation, no crackles or wheezes. Cardiovascular: Regular rate and rhythm, no edema Musculoskeletal:  Neurovascularly intact. RLE cast intact Neurological: No new neurological deficits  Lab Results: BMET    Component Value Date/Time   NA 140 05/05/2012 1820   K 3.7 05/05/2012 1820   CL 106 05/05/2012 1820   CO2 24 05/05/2012 1820   GLUCOSE 98 05/05/2012 1820   BUN 16 05/05/2012 1820   CREATININE 0.84 05/06/2012 0715   CALCIUM 9.3 05/05/2012 1820   GFRNONAA >90 05/06/2012 0715   GFRAA >90 05/06/2012 0715   CBC    Component Value Date/Time   WBC 4.0 05/06/2012 0715   RBC 3.63* 05/06/2012 0715   HGB 10.8* 05/06/2012 0715   HCT 31.3* 05/06/2012 0715   PLT 125* 05/06/2012 0715   MCV 86.2 05/06/2012 0715   MCH 29.8 05/06/2012 0715   MCHC 34.5 05/06/2012 0715   RDW 13.3 05/06/2012 0715   LYMPHSABS 1.2 04/21/2012 0610   MONOABS 0.7 04/21/2012 0610   EOSABS 0.0 04/21/2012 0610   BASOSABS 0.0 04/21/2012 0610    Studies/Results: Ir Removal Tun Cv Cath W/o Fl  05/07/2012  *RADIOLOGY REPORT*  Indication: History of transverse myelitis, no longer in need of plasmapheresis  catheter  REMOVAL OF TUNNELED HEMODIAYLISIS CATHETER  Intravenous medications: None  Complications: None immediate  PROCEDURE / FINDINGS:  Informed written consent was obtained from the patient  following an explanation of the procedure, risks, benefits and alternatives to treatment.  A time out was performed prior to the initiation of the procedure.  Maximal barrier sterile technique was utilized including caps, mask, sterile gowns, sterile gloves, large sterile drape, hand hygiene, and Betadine.  1% lidocaine with epinephrine was injected under sterile conditions along the subcutaneous tunnel.  Utilizing gentle traction, the catheter was removed intact.  Hemostasis was obtained with manual compression. A dressing was placed.  The patient tolerated the procedure well without immediate post procedural complication.  IMPRESSION:  Successful removal of tunneled dialysis catheter.   Original Report Authenticated By: Tacey Ruiz, MD     Medications: I have reviewed the patient's current medications.  Assessment/Plan: Functional deficits secondary to cervico-thoracic transverse myelitis (clinically T8 level) with Brown-Sequard presentation which require 3+ hours per day of interdisciplinary therapy in a comprehensive inpatient rehab setting.  Physiatrist is providing close team supervision and 24 hour management of active medical problems listed below.  Physiatrist and rehab team continue to assess barriers to discharge/monitor patient progress toward functional and medical goals. 1. cervico-thoracic transverse myelitis, T8 with Brown-Squard clinical presentation. NMO ab positive  -no  further treatment while on rehab. Appt made at [facility] by Deatra Ina, PA-C  -plasmapheresis 5/5 completed  -MRI essentially unremarkable  -minimal neuro progress at this point  -pt has remained motivated with therapies  2. Right distal fibular fracture. Patient is nonweightbearing. In cast. Xray 04/22/12 shows  slight loss of alignment- keep NWB  3. DVT Prophylaxis/Anticoagulation: right popliteal DVT-not present on follow up doppler 05/07/12. Will continue treatment with full dose lovenox for now, but consider change to prophylactic dose.  4. Pain Management: Hydrocodone as needed. Monitor with increased activity. Also neurontin 300 tid which has helped dysesthesias  5. Neuropsych: This patient is capable of making decisions on his/her own behalf.  6. Hypertension. Norvasc + Avapro. Monitor with increased activity. Normotensive at present  7. History of gout. Allopurinol daily. Monitor for any flareups  8. Prerenal azotemia: resolved, continue pushing liquids. 9. Thigh wound. Pressure relief. Bowel program. Allevyn, epbc  10. Neurogenic bowel: has better sense of bowels needing to move. Bowel program working better - continent of bladder  Length of stay, days: 18  IRETON,SUSAN C , PA-C 05/08/2012, 9:06 AM  I have examined the patient and agree with above.  Valerie A. Felicity Coyer, MD 9:58 AM

## 2012-05-09 ENCOUNTER — Inpatient Hospital Stay (HOSPITAL_COMMUNITY): Payer: BC Managed Care – PPO | Admitting: Physical Therapy

## 2012-05-09 LAB — CBC
HCT: 33.6 % — ABNORMAL LOW (ref 39.0–52.0)
Hemoglobin: 11.4 g/dL — ABNORMAL LOW (ref 13.0–17.0)
MCH: 29.5 pg (ref 26.0–34.0)
MCV: 86.8 fL (ref 78.0–100.0)
RBC: 3.87 MIL/uL — ABNORMAL LOW (ref 4.22–5.81)

## 2012-05-09 NOTE — Progress Notes (Signed)
ANTICOAGULATION MONITORING NOTE - Follow Up Consult  Pharmacy Consult for Lovenox Indication: DVT  Labs:  Recent Labs  05/09/12 1026  HGB 11.4*  HCT 33.6*  PLT 201    Assessment: 51yo male started on Lovenox for DVT at 1mg /kg Q12H. However, after reports from bleeding from plasmapheresis port an anti-Xa level was checked and reported as supratherapeutic at 1.47. Dose was adjusted by 25%. Anti-Xa rechecked today 4 hours after dose and is now at goal at 1.04. No further bleeding noted. CBC is stable.   Goal of Therapy:  Anti-Xa level 0.6-1.2 units/ml 4hrs after LMWH dose given   Plan: 1. Continue lovenox 100mg  SQ Q12H 2. F/u CBC Q72H 3. F/u S&S of bleeding  Lysle Pearl, PharmD, BCPS Pager # [phone] 05/09/2012 2:51 PM

## 2012-05-09 NOTE — Progress Notes (Signed)
Physical Therapy Note  Patient Details  Name: Matthew Juarez MRN: [id number] Date of Birth: 1960-04-06 Today's Date: 05/09/2012  1500-1555 (55 minutes) individual Pain: no reported pain Focus of treatment: Neuro re-ed RT LE Treatment: Transfers - scoot SBA with assist RT LE on/off legrest; Neuro re-ed RT LE (using suspension grid /gravity eliminated) hip flexion approximately 1/4 to 1/3 range in sidelying with 3 second hold at end of range (pt has improved AROM since last week); hip extension sidelying , knee extension right gravity eliminated, hip abduction/adduction supine (gravity eliminated); bilateral knee flexion using therapy ball in supine ( no active knee flexion on right noted), AA SAQs on right .    [name],JIM 05/09/2012, 7:34 AM

## 2012-05-09 NOTE — Progress Notes (Signed)
Matthew Juarez is a 52 y.o. male [birth year] [id number]  Subjective: No new complaints. Feels well, denies pain. Improving numbness L thigh  Objective: Vital signs in last 24 hours: Temp:  [97.6 F (36.4 C)-98 F (36.7 C)] 97.6 F (36.4 C) (02/09 0615) Pulse Rate:  [66-70] 66 (02/09 0615) Resp:  [18] 18 (02/09 0615) BP: (99-101)/(59-60) 101/60 mmHg (02/09 0615) SpO2:  [96 %-98 %] 96 % (02/09 0615) Weight:  [132.1 kg (291 lb 3.6 oz)] 132.1 kg (291 lb 3.6 oz) (02/09 0557) Weight change: 0.1 kg (3.5 oz) Last BM Date: 05/07/12  Intake/Output from previous day: 02/08 0701 - 02/09 0700 In: 720 [P.O.:720] Out: 1075 [Urine:1075] Last cbgs: CBG (last 3)  No results found for this basename: GLUCAP,  in the last 72 hours   Physical Exam General: No apparent distress    Lungs: Normal effort. Lungs clear to auscultation, no crackles or wheezes. Cardiovascular: Regular rate and rhythm, no edema Musculoskeletal:  RLE cast intact Neurological: No new neurological deficits  Lab Results: BMET    Component Value Date/Time   NA 140 05/05/2012 1820   K 3.7 05/05/2012 1820   CL 106 05/05/2012 1820   CO2 24 05/05/2012 1820   GLUCOSE 98 05/05/2012 1820   BUN 16 05/05/2012 1820   CREATININE 0.84 05/06/2012 0715   CALCIUM 9.3 05/05/2012 1820   GFRNONAA >90 05/06/2012 0715   GFRAA >90 05/06/2012 0715   CBC    Component Value Date/Time   WBC 4.0 05/06/2012 0715   RBC 3.63* 05/06/2012 0715   HGB 10.8* 05/06/2012 0715   HCT 31.3* 05/06/2012 0715   PLT 125* 05/06/2012 0715   MCV 86.2 05/06/2012 0715   MCH 29.8 05/06/2012 0715   MCHC 34.5 05/06/2012 0715   RDW 13.3 05/06/2012 0715   LYMPHSABS 1.2 04/21/2012 0610   MONOABS 0.7 04/21/2012 0610   EOSABS 0.0 04/21/2012 0610   BASOSABS 0.0 04/21/2012 0610    Studies/Results: Ir Removal Tun Cv Cath W/o Fl  05/07/2012  *RADIOLOGY REPORT*  Indication: History of transverse myelitis, no longer in need of plasmapheresis catheter  REMOVAL OF TUNNELED HEMODIAYLISIS CATHETER   Intravenous medications: None  Complications: None immediate  PROCEDURE / FINDINGS:  Informed written consent was obtained from the patient  following an explanation of the procedure, risks, benefits and alternatives to treatment.  A time out was performed prior to the initiation of the procedure.  Maximal barrier sterile technique was utilized including caps, mask, sterile gowns, sterile gloves, large sterile drape, hand hygiene, and Betadine.  1% lidocaine with epinephrine was injected under sterile conditions along the subcutaneous tunnel.  Utilizing gentle traction, the catheter was removed intact.  Hemostasis was obtained with manual compression. A dressing was placed.  The patient tolerated the procedure well without immediate post procedural complication.  IMPRESSION:  Successful removal of tunneled dialysis catheter.   Original Report Authenticated By: Tacey Ruiz, MD     Medications: I have reviewed the patient's current medications.  Assessment/Plan: Functional deficits secondary to cervico-thoracic transverse myelitis (clinically T8 level) with Brown-Sequard presentation which require 3+ hours per day of interdisciplinary therapy in a comprehensive inpatient rehab setting.  Physiatrist is providing close team supervision and 24 hour management of active medical problems listed below.  Physiatrist and rehab team continue to assess barriers to discharge/monitor patient progress toward functional and medical goals. 1. cervico-thoracic transverse myelitis, T8 with Brown-Squard clinical presentation. NMO ab positive  -no further treatment while on rehab. Appt made at [facility] by Deatra Ina, PA-C  -plasmapheresis 5/5 completed  -MRI essentially unremarkable  -minimal neuro progress at this point  -pt has remained motivated with therapies  2. Right distal fibular fracture. Patient is nonweightbearing. In cast. Xray 04/22/12 shows slight loss of alignment- keep NWB  3. DVT  Prophylaxis/Anticoagulation: right popliteal DVT-not present on follow up doppler 05/07/12. Will continue treatment with full dose lovenox for now, but consider change to prophylactic dose.  4. Pain Management: Hydrocodone as needed. Monitor with increased activity. Also neurontin 300 tid which has helped dysesthesias  5. Neuropsych: This patient is capable of making decisions on his/her own behalf.  6. Hypertension. Norvasc + Avapro. Monitor with increased activity. Normotensive at present  7. History of gout. Allopurinol daily. Monitor for any flareups  8. Prerenal azotemia: resolved, continue pushing liquids. 9. Thigh wound. Pressure relief. Bowel program. Allevyn, epbc  10. Neurogenic bowel: has better sense of bowels needing to move. Bowel program working better - continent of bladder  Length of stay, days: 19  Rene Paci , MD  05/09/2012, 8:58 AM

## 2012-05-10 ENCOUNTER — Inpatient Hospital Stay (HOSPITAL_COMMUNITY): Payer: BC Managed Care – PPO

## 2012-05-10 ENCOUNTER — Inpatient Hospital Stay (HOSPITAL_COMMUNITY): Payer: BC Managed Care – PPO | Admitting: Physical Therapy

## 2012-05-10 DIAGNOSIS — G373 Acute transverse myelitis in demyelinating disease of central nervous system: Secondary | ICD-10-CM

## 2012-05-10 DIAGNOSIS — S8263XA Displaced fracture of lateral malleolus of unspecified fibula, initial encounter for closed fracture: Secondary | ICD-10-CM

## 2012-05-10 DIAGNOSIS — W19XXXA Unspecified fall, initial encounter: Secondary | ICD-10-CM

## 2012-05-10 DIAGNOSIS — I803 Phlebitis and thrombophlebitis of lower extremities, unspecified: Secondary | ICD-10-CM

## 2012-05-10 MED FILL — Chlorhexidine Gluconate Liquid 4%: CUTANEOUS | Qty: 30 | Status: AC

## 2012-05-10 NOTE — Progress Notes (Signed)
Patient ID: Matthew Juarez, male   DOB: 08-25-1960, 52 y.o.   MRN: [id number] Subjective/Complaints: No new issues. Bowel program working well. Has q's about dc. Pain controlled.   A 12 point review of systems has been performed and if not noted above is otherwise negative.   Objective: Vital Signs: Blood pressure 92/56, pulse 61, temperature 97.5 F (36.4 C), temperature source Oral, resp. rate 18, height 6\' 5"  (1.956 m), weight 129.5 kg (285 lb 7.9 oz), SpO2 98.00%. No results found.  Recent Labs  05/09/12 1026  WBC 3.3*  HGB 11.4*  HCT 33.6*  PLT 201   No results found for this basename: NA, K, CL, CO, GLUCOSE, BUN, CREATININE, CALCIUM,  in the last 72 hours CBG (last 3)  No results found for this basename: GLUCAP,  in the last 72 hours  Wt Readings from Last 3 Encounters:  05/10/12 129.5 kg (285 lb 7.9 oz)  04/17/12 133.6 kg (294 lb 8.6 oz)  12/05/11 139.708 kg (308 lb)    Physical Exam:  Constitutional: He is oriented to person, place, and time. He appears well-developed.  HENT:  Head: Normocephalic.  Eyes:  Pupils round and reactive to light  Neck: Normal range of motion. Neck supple. No thyromegaly present.  Cardiovascular: Normal rate and regular rhythm. No murmurs  Pulmonary/Chest: Effort normal and breath sounds normal. No respiratory distress.  Abdominal: Soft. Bowel sounds are normal. He exhibits no distension. There is no tenderness.  Musculoskeletal:  Right ankle in splint  Neurological: He is alert and oriented to person, place, and time.  Follows full commands. T8 sensory level. Trace to absent sensation on the left below level, slightly diminished sense to PP/LT on right.(does have paresthesias) RLE remains 1 prox to tr-1 distally at the ankle. LLE is 3+ to 4/5 proximal to distal. UE's are 5/5. Cognitively intact. DTR's are 2+. Cast in place  Skin: thigh breakdown areas healing. No new areas.   Psychiatric: He has a normal mood and affect. His behavior is  normal. Judgment and thought content normal       Assessment/Plan: 1. Functional deficits secondary to cervico-thoracic transverse myelitis (clinically T8 level) with Brown-Sequard presentation which require 3+ hours per day of interdisciplinary therapy in a comprehensive inpatient rehab setting. Physiatrist is providing close team supervision and 24 hour management of active medical problems listed below. Physiatrist and rehab team continue to assess barriers to discharge/monitor patient progress toward functional and medical goals.  Still No appreciable neurological improvement. Does have a better sense of bowel control   FIM: FIM - Bathing Bathing Steps Patient Completed: Chest;Right Arm;Left Arm;Abdomen Bathing: 5: Supervision: Safety issues/verbal cues (UB bathing only at w/c level)  FIM - Upper Body Dressing/Undressing Upper body dressing/undressing steps patient completed: Thread/unthread right sleeve of pullover shirt/dresss;Thread/unthread left sleeve of pullover shirt/dress;Put head through opening of pull over shirt/dress;Pull shirt over trunk Upper body dressing/undressing: 5: Set-up assist to: Obtain clothing/put away FIM - Lower Body Dressing/Undressing Lower body dressing/undressing steps patient completed: Thread/unthread right pants leg;Thread/unthread left pants leg;Pull pants up/down;Don/Doff left sock;Don/Doff left shoe;Fasten/unfasten left shoe Lower body dressing/undressing: 5: Supervision: Safety issues/verbal cues (w/c level)  FIM - Toileting Toileting steps completed by patient: Adjust clothing prior to toileting;Adjust clothing after toileting Toileting: 3: Mod-Patient completed 2 of 3 steps  FIM - Diplomatic Services operational officer Devices: Bedside commode Toilet Transfers: 4-From toilet/BSC: Min A (steadying Pt. > 75%);4-To toilet/BSC: Min A (steadying Pt. > 75%)  FIM - Press photographer  Assistive Devices: Sliding  board Bed/Chair Transfer: 4: Bed > Chair or W/C: Min A (steadying Pt. > 75%);4: Chair or W/C > Bed: Min A (steadying Pt. > 75%)  FIM - Locomotion: Wheelchair Distance: 150 Locomotion: Wheelchair: 6: Travels 150 ft or more, turns around, maneuvers to table, bed or toilet, negotiates 3% grade: maneuvers on rugs and over door sills independently FIM - Locomotion: Ambulation Ambulation/Gait Assistance: Not tested (comment) Locomotion: Ambulation: 0: Activity did not occur  Comprehension Comprehension Mode: Auditory Comprehension: 6-Follows complex conversation/direction: With extra time/assistive device  Expression Expression Mode: Verbal Expression: 6-Expresses complex ideas: With extra time/assistive device  Social Interaction Social Interaction: 7-Interacts appropriately with others - No medications needed.  Problem Solving Problem Solving: 7-Solves complex problems: Recognizes & self-corrects  Memory Memory: 7-Complete Independence: No helper  Medical Problem List and Plan:  1. cervico-thoracic transverse myelitis, T8 with Brown-Squard clinical presentation. NMO ab positive  -no further treatment while on rehab. Appt made at [facility] by Deatra Ina, PA-C  -plasmapheresis 5/5 completed  -follow up with me after dc also 2. Right distal fibular fracture. Patient is nonweightbearing. In cast. xr still shows distracted fragments- keep NWB   -check with ortho re follow up prior to discharge 3. DVT Prophylaxis/Anticoagulation: right popliteal DVT-not present on follow up doppler. Will continue treatment dose lovenox this week then reduce to prophylactic dose. 4. Pain Management: Hydrocodone as needed. Monitor with increased activity   - neurontin 300 tid which has helped dysesthesias 5. Neuropsych: This patient is capable of making decisions on his/her own behalf.  6. Hypertension. Norvasc 5 mg daily, Avapro 150 mg daily. Monitor with increased activity. Normotensive at present   7. History of gout. Allopurinol daily. Monitor for any flareups  8. Prerenal azotemia: resolved 9.  Thigh wound. Pressure relief. Bowel program. Allevyn, epbc 10. Neurogenic bowel: has better sense of bowels needing to move. Bowel program working on schedule. Uses BSC  -continent of bladder LOS (Days) 20 A FACE TO FACE EVALUATION WAS PERFORMED  Dreyson Mishkin T 05/10/2012 8:21 AM

## 2012-05-10 NOTE — Progress Notes (Signed)
Occupational Therapy Session Note  Patient Details  Name: Matthew Juarez MRN: [id number] Date of Birth: 05-07-1960  Today's Date: 05/10/2012 Time: 4098-1191 Time Calculation (min): 60 min  Short Term Goals: Week 3:  OT Short Term Goal 1 (Week 3): STG=LTG secondary to ELOS  Skilled Therapeutic Interventions/Progress Updates:    Pt completed bathing at supervison level with wife providing appropriate level of supervision and gathering supplies.  Pt performed drop arm BSC transfer and toileting tasks at mod I level. Pt rolled to ADL apartment for tub bench transfers at supervision level, home management tasks at w/c level at mod I level, and furniture transfers without sliding board at mod I level.  Focus on activity tolerance, safety awareness, w/c setup and mobility, transfers, and appropriate direction to caregivers for needed assistance.   Therapy Documentation Precautions:  Precautions Precautions: Fall Precaution Comments: R leg weakness, but + sensation, left leg lack of coordination and no sensation Required Braces or Orthoses: Other Brace/Splint Other Brace/Splint: R foot casted  Restrictions Weight Bearing Restrictions: Yes RLE Weight Bearing: Non weight bearing LLE Weight Bearing: Weight bearing as tolerated   Vital Signs: Therapy Vitals BP: 120/70 mmHg Pain: Pain Assessment Pain Assessment: No/denies pain Pain Score: 0-No pain Pain Type: Acute pain Pain Location: Leg Pain Orientation: Lower Pain Descriptors: Aching Pain Frequency: Intermittent Pain Onset: On-going ADL: ADL Equipment Provided: Reacher;Long-handled sponge Eating: Independent Where Assessed-Eating: Chair Grooming: Independent Where Assessed-Grooming: Sitting at sink Upper Body Bathing: Modified independent Where Assessed-Upper Body Bathing: Edge of bed Lower Body Bathing: Supervision/safety Where Assessed-Lower Body Bathing: Bed level;Edge of bed Upper Body Dressing: Independent Where  Assessed-Upper Body Dressing: Edge of bed Lower Body Dressing: Modified independent Where Assessed-Lower Body Dressing: Edge of bed Toileting: Contact guard Where Assessed-Toileting: Bedside Commode Toilet Transfer: Dependent Statistician Method: Scientist, research (life sciences): Drop arm bedside commode Tub/Shower Transfer: Close supervison Tub/Shower Transfer Method: Psychologist, prison and probation services: Transfer tub bench  See FIM for current functional status  Therapy/Group: Individual Therapy  Rich Brave 05/10/2012, 10:53 AM

## 2012-05-10 NOTE — Progress Notes (Signed)
Occupational Therapy Discharge Summary  Patient Details  Name: Matthew Juarez MRN: [id number] Date of Birth: 11-10-1960  Today's Date: 05/10/2012  Patient has met 10 of 10 long term goals due to improved activity tolerance, improved balance and ability to compensate for deficits. Patient made excellent progress with bathing, dressing, drop arm BSC transfers, toileting, tub transfers, and home management tasks at w/c level.  Pt is mod I for dressing, toilet transfers, toileting, and home management tasks.  Pt is supervision for bathing at bed level and tub bench transfers.   Pt's wife has been present for numerous therapy sessions and provides appropriate level of supervision/assistance. Patient to discharge at overall Supervision level.  Patient's care partner is independent to provide the necessary physical assistance at discharge.    Recommendation:  Patient will benefit from ongoing skilled OT services in home health setting to continue to advance functional skills in the area of BADL.  Equipment: Tub Bench; Drop Arm BSC  Reasons for discharge: discharge from hospital  Patient/family agrees with progress made and goals achieved: Yes  OT Discharge   ADL ADL Equipment Provided: Reacher;Long-handled sponge Eating: Independent Where Assessed-Eating: Chair Grooming: Independent Where Assessed-Grooming: Edge of bed Upper Body Bathing: Independent Where Assessed-Upper Body Bathing: Edge of bed Lower Body Bathing: Supervision/safety Where Assessed-Lower Body Bathing: Edge of bed;Bed level Upper Body Dressing: Independent Where Assessed-Upper Body Dressing: Edge of bed Lower Body Dressing: Modified independent Where Assessed-Lower Body Dressing: Edge of bed Toileting: Modified independent Where Assessed-Toileting: Bedside Commode Toilet Transfer: Modified independent Toilet Transfer Method: Ambulance person: Drop arm bedside commode Tub/Shower Transfer: Close  supervison Web designer Method: Conservation officer, historic buildings: Emergency planning/management officer Vision/Perception  Vision - History Baseline Vision: No visual deficits Patient Visual Report: No change from baseline Vision - Assessment Eye Alignment: Within Functional Limits Vision Assessment: Vision not tested Perception Perception: Within Functional Limits Praxis Praxis: Intact  Cognition Overall Cognitive Status: Appears within functional limits for tasks assessed Arousal/Alertness: Awake/alert Orientation Level: Oriented X4    Trunk/Postural Assessment  Cervical Assessment Cervical Assessment: Within Functional Limits Thoracic Assessment Thoracic Assessment: Within Functional Limits Lumbar Assessment Lumbar Assessment: Within Functional Limits Postural Control Postural Control: Within Functional Limits  Balance Static Sitting Balance Static Sitting - Balance Support: Feet supported Static Sitting - Level of Assistance: 7: Independent Dynamic Sitting Balance Dynamic Sitting - Level of Assistance: 6: Modified independent (Device/Increase time) Extremity/Trunk Assessment RUE Assessment RUE Assessment: Within Functional Limits LUE Assessment LUE Assessment: Within Functional Limits  See FIM for current functional status  Rich Brave 05/10/2012, 1:23 PM

## 2012-05-10 NOTE — Progress Notes (Signed)
Physical Therapy Note  Patient Details  Name: APOLINAR BERO MRN: [id number] Date of Birth: 05-18-1960 Today's Date: 05/10/2012  1135-1200 (25 minutes) individual Pain: no reported pain Focus of treatment: wc setup for DC; bed mobility training Treatment: transfers wc >< mat SBA + assist RT LE/legrest; sit to supine using towel under RT thigh SBA X 2 with difficulty; wc setup for rental WC (for home use) - legrest adjustments made with apparatus to maintain RT LE on elevated  legrest ; pt modified independent with rental wc on unit > 150 feet level controlled environment.    Tate Jerkins,JIM 05/10/2012, 7:24 AM

## 2012-05-10 NOTE — Progress Notes (Signed)
Physical Therapy Discharge Summary  Patient Details  Name: Matthew Juarez MRN: [id number] Date of Birth: [birth year]  Today's Date: 05/10/2012 Time:Session #1:  4098-1191, Session #2: [id number] Time Calculation (min): Session #1: 58 min, Session #2: 57 min  Patient has met 8 of 8 long term goals due to improved activity tolerance, improved balance, improved postural control, increased strength, decreased pain and functional use of  right lower extremity and left lower extremity.  Patient to discharge at a wheelchair level Modified Independent.   Patient's care partner is independent to provide the necessary physical assistance at discharge.  Reasons goals not met: NA  Recommendation:  Patient will benefit from ongoing skilled PT services in home health setting to continue to advance safe functional mobility, address ongoing impairments in balance, leg strength, mobility, activity tolerance, safety, gait/standing and pre-gait activities and minimize fall risk.  Equipment: tall RW, 20x 18 WC with basic cushion bil elevating leg rest, swing away arm rests, and anti tippers.    Reasons for discharge: treatment goals met and discharge from hospital  Patient/family agrees with progress made and goals achieved: Yes  PT Discharge Precautions/Restrictions Precautions Precautions: Fall Precaution Comments: R leg weakness, but + sensation, left leg lack of coordination and no sensation Required Braces or Orthoses: Other Brace/Splint Other Brace/Splint: R foot casted  Restrictions RLE Weight Bearing: Non weight bearing Vital Signs Therapy Vitals BP: 120/70 mmHg Pain Pain Assessment Pain Assessment: No/denies pain Pain Score: 0-No pain     Cognition Orientation Level: Oriented X4 Sensation Sensation Light Touch: Impaired Detail Light Touch Impaired Details: Impaired RLE;Impaired LLE Additional Comments: compared to when he was admitted to CIR improved left upper thigh and left trunk  sensation, but still significantly deminished compared to right leg.  Right leg has decreased sensation as well, but pt able to differentiate place of LT.   Coordination Gross Motor Movements are Fluid and Coordinated: No Heel Shin Test: Pt with decreased coordination of movement in left leg, improved since evaluation.  Unable to test right leg due to not strong enough.      Mobility Bed Mobility Bed Mobility: Supine to Sit;Sitting - Scoot to Edge of Bed Supine to Sit: 6: Modified independent (Device/Increase time);HOB flat Sitting - Scoot to Edge of Bed: 6: Modified independent (Device/Increase time) Transfers Lateral/Scoot Transfers: 6: Modified independent (Device/Increase time);With armrests removed;From elevated surface Locomotion  Ambulation Ambulation/Gait Assistance: Not tested (comment) Wheelchair Mobility Wheelchair Mobility: Yes Wheelchair Assistance: 6: Modified independent (Device/Increase time) Occupational hygienist: Both upper extremities Wheelchair Parts Management: Independent Distance: 200     Games developer Sitting - Balance Support: No upper extremity supported;Feet supported Static Sitting - Level of Assistance: 6: Modified independent (Device/Increase time) Dynamic Sitting Balance Dynamic Sitting - Balance Support: Right upper extremity supported;Left upper extremity supported;Feet supported (one upper extremity supported at a time) Dynamic Sitting - Level of Assistance: 6: Modified independent (Device/Increase time) Static Standing Balance Static Standing - Balance Support: Bilateral upper extremity supported Static Standing - Level of Assistance: 1: +2 Total assist Static Standing - Comment/# of Minutes: 2 person assist for safety with RW to stand from elevated mat table or beside elevated bed.  Educated pt and wife re: not to try this at home until the home PT has worked with them on the safest way to do this by themselves.  They have  not gotten to this level yet.   Extremity Assessment      RLE Assessment RLE Assessment: Exceptions to [facility]  RLE Strength RLE Overall Strength Comments: + toe movement, knee extension 2-/5, knee flexion 1/5, hip flexion 1/5, glute firing 1/5.   LLE Assessment LLE Assessment: Within Functional Limits  See FIM for current functional status Skilled Interventions: Session #1: This session focused on transfers bed to [facility] mod I from higher surface to lower surface scoot transfer to left.  Independent management of WC, WC mobility 200' mod I.  Car transfer mod I into and out of 28" high car seat height.  Only assist needed to place slide board.   Session #2: This session focused on independent management of NEW WC parts and leg rests.  Adjustments recommended to increase pt's independence are :swing away armrests, and anti tippers for fall prevention.  WC mobility >200' mod I with independent management of parts (except armrests which were jammed on WC- will see what we can do about trading out for swing away arm rests).  WC to higher bed transfer with mod I.  Sit to supine using towel looped under right leg mod I using trunk momentum to swing leg into bed.  Supine to sit mod I using towel to move right leg to EOB.  Scoot transfer to the right from high bed to low WC mod I.  Transfers WC to low couch mod I lateral scoot to the left, mod I slide board to the right with cues for safe technique and 3 attempts before successful independent board placement.  Car transfer repeated for wife and son education purposes.  Pt mod I except for board placement on the way out of the car.  Wife and son report feeling comfortable assisting with this transfer.  Pt's leg rest right adjusted and reinforced with tape.  Pt provided with an extra velcro leg strap for right leg and a leg lifter to help decrease burden on wife when she is assisting in moving his leg (this prevents her from leaning over and straining her low back).    Rollene Rotunda Audreanna Torrisi, PT, DPT [phone]   05/10/2012, 11:47 AM

## 2012-05-10 NOTE — Discharge Summary (Signed)
  Discharge summary job # [id number]

## 2012-05-10 NOTE — Plan of Care (Signed)
Problem: RH Balance Goal: LTG Patient will maintain dynamic standing balance (PT) LTG: Patient will maintain dynamic standing balance with assistance during mobility activities (PT)  Outcome: Not Met (add Reason) Pt unable to stand safely without 2 person assit

## 2012-05-11 MED ORDER — BISACODYL 10 MG RE SUPP: 10.0000 mg | Freq: Every day | RECTAL | Status: DC

## 2012-05-11 MED ORDER — CO Q-10 200 MG PO CAPS: 1.0000 | ORAL_CAPSULE | Freq: Every day | ORAL | Status: AC

## 2012-05-11 MED ORDER — ENOXAPARIN SODIUM 40 MG/0.4ML ~~LOC~~ SOLN: 40.0000 mg | SUBCUTANEOUS | Status: DC

## 2012-05-11 MED ORDER — ALLOPURINOL 300 MG PO TABS: 300.0000 mg | ORAL_TABLET | Freq: Every day | ORAL | Status: DC

## 2012-05-11 MED ORDER — GABAPENTIN 300 MG PO CAPS: 300.0000 mg | ORAL_CAPSULE | Freq: Three times a day (TID) | ORAL | Status: DC

## 2012-05-11 MED ORDER — HYDROCODONE-ACETAMINOPHEN 5-325 MG PO TABS
1.0000 | ORAL_TABLET | ORAL | Status: DC | PRN
Start: 2012-05-11 — End: 2012-06-23

## 2012-05-11 MED ORDER — SENNOSIDES-DOCUSATE SODIUM 8.6-50 MG PO TABS: 2.0000 | ORAL_TABLET | Freq: Every day | ORAL | Status: DC

## 2012-05-11 MED ORDER — ENOXAPARIN SODIUM 40 MG/0.4ML ~~LOC~~ SOLN
40.0000 mg | SUBCUTANEOUS | Status: DC
  Administered 2012-05-11: 40 mg via SUBCUTANEOUS
  Filled 2012-05-11: qty 0.4

## 2012-05-11 MED ORDER — AMLODIPINE-OLMESARTAN 5-20 MG PO TABS: 1.0000 | ORAL_TABLET | Freq: Every day | ORAL | Status: DC

## 2012-05-11 NOTE — Progress Notes (Signed)
Social Work  Discharge Note  The overall goal for the admission was met for:   Discharge location: Yes - home with wife and son to assist  Length of Stay: Yes - 21 days  Discharge activity level: Yes - modified independent w/c level overall  Home/community participation: Yes  Services provided included: MD, RD, PT, OT, RN, TR, Pharmacy, Neuropsych and SW  Financial Services: Private Insurance: [place] BCBS  Follow-up services arranged: Home Health: RN, PT, OT via [facility], DME: tall rolling walker, 30" transfer board and [employer] tub bench via [facility] and Patient/Family has no preference for HH/DME agencies  Comments (or additional information): 20x18 Wheelchair with cushion via [employer]  Patient/Family verbalized understanding of follow-up arrangements: Yes  Individual responsible for coordination of the follow-up plan: patient  Confirmed correct DME delivered: Isaac Dubie 05/11/2012    Magali Bray

## 2012-05-11 NOTE — Progress Notes (Signed)
Pt discharged home with family. Discharge instructions provided by Harvel Ricks, PA. All questions answered. Pt escorted off unit with personal belonging in w/c with family.

## 2012-05-11 NOTE — Progress Notes (Signed)
Patient ID: Matthew Juarez, male   DOB: [birth year], 52 y.o.   MRN: [id number] Subjective/Complaints: No new complaints. Excited to go home.   A 12 point review of systems has been performed and if not noted above is otherwise negative.   Objective: Vital Signs: Blood pressure 102/63, pulse 65, temperature 98.1 F (36.7 C), temperature source Oral, resp. rate 18, height 6\' 5"  (1.956 m), weight 129.8 kg (286 lb 2.5 oz), SpO2 97.00%. No results found.  Recent Labs  05/09/12 1026  WBC 3.3*  HGB 11.4*  HCT 33.6*  PLT 201   No results found for this basename: NA, K, CL, CO, GLUCOSE, BUN, CREATININE, CALCIUM,  in the last 72 hours CBG (last 3)  No results found for this basename: GLUCAP,  in the last 72 hours  Wt Readings from Last 3 Encounters:  05/11/12 129.8 kg (286 lb 2.5 oz)  04/17/12 133.6 kg (294 lb 8.6 oz)  12/05/11 139.708 kg (308 lb)    Physical Exam:  Constitutional: He is oriented to person, place, and time. He appears well-developed.  HENT:  Head: Normocephalic.  Eyes:  Pupils round and reactive to light  Neck: Normal range of motion. Neck supple. No thyromegaly present.  Cardiovascular: Normal rate and regular rhythm. No murmurs  Pulmonary/Chest: Effort normal and breath sounds normal. No respiratory distress.  Abdominal: Soft. Bowel sounds are normal. He exhibits no distension. There is no tenderness.  Musculoskeletal:  Right ankle in splint  Neurological: He is alert and oriented to person, place, and time.  Follows full commands. T8 sensory level. Trace to absent sensation on the left below level, slightly diminished sense to PP/LT on right.(does have paresthesias) RLE remains 1 prox to tr-1 distally at the ankle. LLE is 3+ to 4/5 proximal to distal. UE's are 5/5. Cognitively intact. DTR's are 2+. Cast in place  Skin: thigh breakdown areas healing. No new areas.   Psychiatric: He has a normal mood and affect. His behavior is normal. Judgment and thought content  normal       Assessment/Plan: 1. Functional deficits secondary to cervico-thoracic transverse myelitis (clinically T8 level) with Brown-Sequard presentation which require 3+ hours per day of interdisciplinary therapy in a comprehensive inpatient rehab setting. Physiatrist is providing close team supervision and 24 hour management of active medical problems listed below. Physiatrist and rehab team continue to assess barriers to discharge/monitor patient progress toward functional and medical goals.  Dc home today. He will need ortho, neuro and rehab follow up   FIM: FIM - Bathing Bathing Steps Patient Completed: Chest;Right upper leg;Left upper leg;Right Arm;Left Arm;Right lower leg (including foot);Left lower leg (including foot);Abdomen;Front perineal area;Buttocks Bathing: 5: Supervision: Safety issues/verbal cues  FIM - Upper Body Dressing/Undressing Upper body dressing/undressing steps patient completed: Thread/unthread right sleeve of pullover shirt/dresss;Thread/unthread left sleeve of pullover shirt/dress;Put head through opening of pull over shirt/dress;Pull shirt over trunk Upper body dressing/undressing: 7: Complete Independence: No helper FIM - Lower Body Dressing/Undressing Lower body dressing/undressing steps patient completed: Thread/unthread right pants leg;Thread/unthread left pants leg;Pull pants up/down;Fasten/unfasten pants;Don/Doff left sock;Don/Doff left shoe;Fasten/unfasten left shoe Lower body dressing/undressing: 6: More than reasonable amount of time  FIM - Toileting Toileting steps completed by patient: Adjust clothing prior to toileting;Performs perineal hygiene;Adjust clothing after toileting Toileting: 6: More than reasonable amount of time  FIM - Diplomatic Services operational officer Devices: Bedside commode Toilet Transfers: 6-More than reasonable amt of time;6-Assistive device: No helper;6-To toilet/ BSC;6-From toilet/BSC  FIM - Network engineer  Assistive Devices: Sliding board;Arm rests Bed/Chair Transfer: 6: Supine > Sit: No assist;6: Sit > Supine: No assist;6: Bed > Chair or W/C: No assist;6: Chair or W/C > Bed: No assist  FIM - Locomotion: Wheelchair Distance: 200 Locomotion: Wheelchair: 6: Travels 150 ft or more, turns around, maneuvers to table, bed or toilet, negotiates 3% grade: maneuvers on rugs and over door sills independently FIM - Locomotion: Ambulation Ambulation/Gait Assistance: Not tested (comment) Locomotion: Ambulation: 0: Activity did not occur  Comprehension Comprehension Mode: Auditory Comprehension: 7-Follows complex conversation/direction: With no assist  Expression Expression Mode: Verbal Expression: 7-Expresses complex ideas: With no assist  Social Interaction Social Interaction: 7-Interacts appropriately with others - No medications needed.  Problem Solving Problem Solving: 7-Solves complex problems: Recognizes & self-corrects  Memory Memory: 7-Complete Independence: No helper  Medical Problem List and Plan:  1. cervico-thoracic transverse myelitis, T8 with Brown-Squard clinical presentation. NMO ab positive  -no further treatment while on rehab. Appt made at [facility] by Deatra Ina, PA-C  -plasmapheresis 5/5 completed  -follow up with me after dc also 2. Right distal fibular fracture. Patient is nonweightbearing. In cast. xr still shows distracted fragments- keep NWB   -check with ortho re follow up prior to discharge 3. DVT Prophylaxis/Anticoagulation: right popliteal DVT-not present on follow up doppler. Will continue treatment dose lovenox this week then reduce to prophylactic dose. 4. Pain Management: Hydrocodone as needed. Monitor with increased activity   - neurontin 300 tid which has helped dysesthesias 5. Neuropsych: This patient is capable of making decisions on his/her own behalf.  6. Hypertension. Norvasc 5 mg daily, Avapro 150 mg daily. Monitor  with increased activity. Normotensive at present  7. History of gout. Allopurinol daily. Monitor for any flareups  8. Prerenal azotemia: resolved 9.  Thigh wound. Pressure relief. Bowel program. Avoid rubbing, excessive moisture to inner thighs 10. Neurogenic bowel: continue bowel program at dc  -continent of bladder LOS (Days) 21 A FACE TO FACE EVALUATION WAS PERFORMED  SWARTZ,ZACHARY T 05/11/2012 8:14 AM

## 2012-05-11 NOTE — Discharge Summary (Signed)
NAME:  Matthew Juarez, Matthew Juarez                ACCOUNT NO.:  [id number]  MEDICAL RECORD NO.:  [id number]  LOCATION:  4038                         FACILITY:  [facility]  PHYSICIAN:  Ranelle Oyster, M.D.DATE OF BIRTH:  02/07/1961  DATE OF ADMISSION:  04/20/2012 DATE OF DISCHARGE:  05/11/2012                              DISCHARGE SUMMARY   DISCHARGE DIAGNOSES: 1. Cervicothoracic transverse myelitis, T8 with Brown-Sequard clinical     presentation with positive neuromyelitis optica antibody. 2. Right distal fibular fracture. 3. Right popliteal DVT - resolved. 4. Pain management. 5. Hypertension. 6. History of gout. 7. Pre-renal azotemia, resolved. 8. Thigh wound, improved. 9. Neurogenic bowel.  This is a 52 year old right-handed male with history of hypertension, chronic back pain who was admitted on April 17, 2012, with progressive lower extremity weakness x5 days as well as a fall injuring his right ankle.  He denied any chills or fever.  He denied any urinary or bowel incontinence, but had bouts of constipation.  MRI of the spine showed intrinsic intramedullary cord lesion with prolonged T2 signal and variable abnormal enhancement.  Finding consistent with idiopathic acute transverse myelitis.  X-rays of right ankle showed distal fibular fracture with orthopedic Services consulted, Dr. August Saucer, advised nonweightbearing right lower extremity with a splint applied.  Neurology Services consulted in regards to transverse myelitis, placed on intravenous Solu-Medrol.  Echocardiogram with ejection fraction of 60% and grade 1 diastolic dysfunction.  Physical and occupational therapy evaluations completed and ongoing.  The patient was felt to be a good candidate for inpatient rehab services and was admitted for comprehensive rehab program.  PAST MEDICAL HISTORY:  See discharge diagnoses.  SOCIAL HISTORY:  Lives with spouse, 1-level home, 5 steps to entry.  FUNCTIONAL HISTORY:  Prior to admission,  independent.  Works full time. Functional status upon admission to rehab services was +2 total assist for lateral scoot transfers, ambulation not tested.  PHYSICAL EXAMINATION:  VITAL SIGNS:  Blood pressure 112/70, pulse 73, temperature 97.6, respirations 18. GENERAL:  This was an alert male, oriented x3.  Pupils were round, reactive to light. LUNGS:  Clear to auscultation. CARDIAC:  Regular rate and rhythm. ABDOMEN:  Soft, nontender.  Good bowel sounds. EXTREMITIES:  Right ankle with splint in place.  T8 sensory level trace to absent sensation on the left below level, slightly diminished sense to pinprick on the right.  REHABILITATION HOSPITAL COURSE:  The patient was admitted to inpatient rehab services with therapies initiated on a 3-hour daily basis consisting of physical therapy, occupational therapy, and 24-hour rehabilitation nursing.  The following issues were addressed during the patient's rehabilitation stay.  Pertaining to Matthew Juarez's cervicothoracic transverse myelitis, followed by Neurology Services.  He completed a course of Solu-Medrol.  Reports came back NMO antibody positive.  The patient did complete a 5-day course of plasmapheresis showing slow progress.  It was at the patient's request that he follow up for a second opinion at [facility] with appointment being arranged for May 21, 2012, at 9:30 am, at phone number [phone] at the [facility].  He continued to be followed by Neurology Services while at [facility].  The patient with right distal  fibular fracture.  He was later fitted with a short-leg cast per Orthopedic Services.  Dr. August Saucer advised nonweightbearing, neurovascular intact.  The patient had been placed on Lovenox for DVT prophylaxis upon admission to rehab services.  A venous Doppler study on 01/23 did show to be positive, right popliteal DVT that was very small.  He was placed on treatment dose of Lovenox throughout  that course.  Followup Doppler studies on May 04, 2012, showed DVT resolved.  He was discharged to home on Lovenox 40 mg daily x1 more month.  Pain management ongoing with the use of hydrocodone and Neurontin with good results.  His blood pressures are well controlled on Norvasc and Avapro with no orthostatic changes.  He did have a history of gout as he continued on allopurinol daily.  He did have a left thigh wound with pressure relief in Allevyn dressing changes with barrier cream.  Neurogenic bowel with bowel program augmented and doing quite nicely.  The patient received weekly collaborative interdisciplinary team conferences to discuss estimated length of stay, family teaching, and any barriers to his discharge.  He was continent of bowel and bladder, required supervision, bathing, and dressing at bed level supervision minimal assist.  Transfers total assist, toileting and bowel program, modified independent wheelchair mobility, minimal assist transfers, scoot pivot and sliding board to the left, minimal assist to the right.  He has high to low level transfers. If low to high on the right, required moderate assist to help facilitate getting thigh up and over edge of wheelchair.  Car transfers minimal assist, scoot pivot in moderate assist sliding board out, standing with a rolling walker from an elevated surface, 2 person assist.  Full family teaching was completed with his wife for planned discharge to home on May 11, 2012, and ongoing therapy dictated as per [employer]. The patient would follow up with Dr. Faith Rogue at the outpatient rehab service office as advised, appointment made on June 09, 2012. Again, he would follow up at the [facility] May 21, 2012, at 9:30 am.  DISCHARGE MEDICATIONS: 1. Allopurinol 300 mg p.o. daily. 2. Norvasc 5 mg p.o. daily. 3. Avapro 150 mg p.o. daily. 4. Dulcolax suppository rectally daily. 5. Tums 500 mg. 6.  Lovenox 40 mg subcutaneously daily x1 month. 7. Hydrocodone 5/325 one or 2 tablets every 4 hours as needed for     pain. 8. Protonix 40 mg p.o. daily. 9. Florastor 250 mg p.o. b.i.d. 10.Senokot-S tablets 1 daily bedtime as needed for constipation and 2     tablets at bedtime.  DIET:  Regular.  SPECIAL INSTRUCTIONS:  The patient would follow up with Dr. Andi Devon for medical management, appointment to be made; Dr. Faith Rogue at the outpatient rehab service office on June 09, 2012, at 11:30 am, and again follow up at [facility] on May 21, 2012, arrival at 9:30 a.m.  Phone number [phone].     Mariam Dollar, P.A.   ______________________________ Ranelle Oyster, M.D.    DA/MEDQ  D:  05/10/2012  T:  05/11/2012  Job:  [id number]  cc:   Merlene Laughter. Renae Gloss, M.D. Thana Farr, MD G. Dorene Grebe, M.D.

## 2012-05-14 LAB — FUNGUS CULTURE W SMEAR: Fungal Smear: NONE SEEN

## 2012-05-18 ENCOUNTER — Telehealth: Payer: Self-pay | Admitting: Physical Medicine & Rehabilitation

## 2012-05-18 NOTE — Telephone Encounter (Signed)
Declined Home Health OT

## 2012-06-09 ENCOUNTER — Encounter: Payer: BC Managed Care – PPO | Admitting: Physical Medicine & Rehabilitation

## 2012-06-23 ENCOUNTER — Inpatient Hospital Stay (HOSPITAL_COMMUNITY)
Admission: EM | Admit: 2012-06-23 | Discharge: 2012-07-06 | DRG: 882 | Disposition: A | Discharge status: Home / Self Care | Payer: BC Managed Care – PPO | Attending: Emergency Medicine | Admitting: Emergency Medicine

## 2012-06-23 ENCOUNTER — Encounter (HOSPITAL_COMMUNITY): Payer: Self-pay | Admitting: *Deleted

## 2012-06-23 ENCOUNTER — Inpatient Hospital Stay (HOSPITAL_COMMUNITY): Payer: BC Managed Care – PPO

## 2012-06-23 ENCOUNTER — Emergency Department (HOSPITAL_COMMUNITY): Payer: BC Managed Care – PPO

## 2012-06-23 DIAGNOSIS — I509 Heart failure, unspecified: Secondary | ICD-10-CM | POA: Diagnosis present

## 2012-06-23 DIAGNOSIS — S82891A Other fracture of right lower leg, initial encounter for closed fracture: Secondary | ICD-10-CM

## 2012-06-23 DIAGNOSIS — D696 Thrombocytopenia, unspecified: Secondary | ICD-10-CM | POA: Diagnosis present

## 2012-06-23 DIAGNOSIS — I4729 Other ventricular tachycardia: Secondary | ICD-10-CM

## 2012-06-23 DIAGNOSIS — R29898 Other symptoms and signs involving the musculoskeletal system: Secondary | ICD-10-CM | POA: Diagnosis present

## 2012-06-23 DIAGNOSIS — I824Y9 Acute embolism and thrombosis of unspecified deep veins of unspecified proximal lower extremity: Secondary | ICD-10-CM | POA: Diagnosis present

## 2012-06-23 DIAGNOSIS — E872 Acidosis, unspecified: Secondary | ICD-10-CM | POA: Diagnosis present

## 2012-06-23 DIAGNOSIS — G360 Neuromyelitis optica [Devic]: Secondary | ICD-10-CM | POA: Diagnosis present

## 2012-06-23 DIAGNOSIS — R680 Hypothermia, not associated with low environmental temperature: Secondary | ICD-10-CM | POA: Diagnosis present

## 2012-06-23 DIAGNOSIS — R55 Syncope and collapse: Secondary | ICD-10-CM | POA: Diagnosis present

## 2012-06-23 DIAGNOSIS — N179 Acute kidney failure, unspecified: Secondary | ICD-10-CM | POA: Diagnosis present

## 2012-06-23 DIAGNOSIS — M549 Dorsalgia, unspecified: Secondary | ICD-10-CM

## 2012-06-23 DIAGNOSIS — J690 Pneumonitis due to inhalation of food and vomit: Secondary | ICD-10-CM | POA: Diagnosis not present

## 2012-06-23 DIAGNOSIS — R7989 Other specified abnormal findings of blood chemistry: Secondary | ICD-10-CM | POA: Diagnosis present

## 2012-06-23 DIAGNOSIS — R042 Hemoptysis: Secondary | ICD-10-CM | POA: Diagnosis not present

## 2012-06-23 DIAGNOSIS — I10 Essential (primary) hypertension: Secondary | ICD-10-CM | POA: Diagnosis present

## 2012-06-23 DIAGNOSIS — R7309 Other abnormal glucose: Secondary | ICD-10-CM | POA: Diagnosis present

## 2012-06-23 DIAGNOSIS — A0472 Enterocolitis due to Clostridium difficile, not specified as recurrent: Secondary | ICD-10-CM

## 2012-06-23 DIAGNOSIS — R319 Hematuria, unspecified: Secondary | ICD-10-CM

## 2012-06-23 DIAGNOSIS — D649 Anemia, unspecified: Secondary | ICD-10-CM | POA: Diagnosis present

## 2012-06-23 DIAGNOSIS — J90 Pleural effusion, not elsewhere classified: Secondary | ICD-10-CM

## 2012-06-23 DIAGNOSIS — R195 Other fecal abnormalities: Secondary | ICD-10-CM

## 2012-06-23 DIAGNOSIS — M109 Gout, unspecified: Secondary | ICD-10-CM | POA: Diagnosis present

## 2012-06-23 DIAGNOSIS — G9340 Encephalopathy, unspecified: Secondary | ICD-10-CM | POA: Diagnosis present

## 2012-06-23 DIAGNOSIS — I472 Ventricular tachycardia: Secondary | ICD-10-CM

## 2012-06-23 DIAGNOSIS — J9600 Acute respiratory failure, unspecified whether with hypoxia or hypercapnia: Secondary | ICD-10-CM | POA: Diagnosis present

## 2012-06-23 DIAGNOSIS — I2699 Other pulmonary embolism without acute cor pulmonale: Principal | ICD-10-CM | POA: Diagnosis present

## 2012-06-23 DIAGNOSIS — R570 Cardiogenic shock: Secondary | ICD-10-CM | POA: Diagnosis present

## 2012-06-23 DIAGNOSIS — E875 Hyperkalemia: Secondary | ICD-10-CM | POA: Diagnosis present

## 2012-06-23 DIAGNOSIS — I469 Cardiac arrest, cause unspecified: Secondary | ICD-10-CM

## 2012-06-23 DIAGNOSIS — E669 Obesity, unspecified: Secondary | ICD-10-CM | POA: Diagnosis present

## 2012-06-23 DIAGNOSIS — Z79899 Other long term (current) drug therapy: Secondary | ICD-10-CM

## 2012-06-23 DIAGNOSIS — E876 Hypokalemia: Secondary | ICD-10-CM | POA: Diagnosis not present

## 2012-06-23 DIAGNOSIS — G373 Acute transverse myelitis in demyelinating disease of central nervous system: Secondary | ICD-10-CM

## 2012-06-23 DIAGNOSIS — G8220 Paraplegia, unspecified: Secondary | ICD-10-CM

## 2012-06-23 DIAGNOSIS — R0902 Hypoxemia: Secondary | ICD-10-CM | POA: Diagnosis present

## 2012-06-23 DIAGNOSIS — I369 Nonrheumatic tricuspid valve disorder, unspecified: Secondary | ICD-10-CM

## 2012-06-23 DIAGNOSIS — R579 Shock, unspecified: Secondary | ICD-10-CM

## 2012-06-23 DIAGNOSIS — G8929 Other chronic pain: Secondary | ICD-10-CM

## 2012-06-23 HISTORY — DX: Cardiac arrest, cause unspecified: I46.9

## 2012-06-23 LAB — POCT I-STAT 3, ART BLOOD GAS (G3+)
Acid-base deficit: 9 mmol/L — ABNORMAL HIGH (ref 0.0–2.0)
Bicarbonate: 27.5 mEq/L — ABNORMAL HIGH (ref 20.0–24.0)
Bicarbonate: 18.4 mEq/L — ABNORMAL LOW (ref 20.0–24.0)
O2 Saturation: 96 %
Patient temperature: 95.7
Patient temperature: 96.8
TCO2: 30 mmol/L (ref 0–100)
TCO2: 20 mmol/L (ref 0–100)
pCO2 arterial: 53.8 mmHg — ABNORMAL HIGH (ref 35.0–45.0)
pH, Arterial: 7.211 — ABNORMAL LOW (ref 7.350–7.450)
pH, Arterial: 7.195 — CL (ref 7.350–7.450)

## 2012-06-23 LAB — LACTIC ACID, PLASMA: Lactic Acid, Venous: 13 mmol/L — ABNORMAL HIGH (ref 0.5–2.2)

## 2012-06-23 LAB — URINALYSIS, ROUTINE W REFLEX MICROSCOPIC
Bilirubin Urine: NEGATIVE
Ketones, ur: NEGATIVE mg/dL
Nitrite: NEGATIVE
Protein, ur: 100 mg/dL — AB
Urobilinogen, UA: 1 mg/dL (ref 0.0–1.0)

## 2012-06-23 LAB — CBC
HCT: 39.9 % (ref 39.0–52.0)
HCT: 34 % — ABNORMAL LOW (ref 39.0–52.0)
Hemoglobin: 12.9 g/dL — ABNORMAL LOW (ref 13.0–17.0)
Hemoglobin: 11.6 g/dL — ABNORMAL LOW (ref 13.0–17.0)
MCHC: 34.1 g/dL (ref 30.0–36.0)
RDW: 13 % (ref 11.5–15.5)
RDW: 12.9 % (ref 11.5–15.5)
WBC: 18.3 10*3/uL — ABNORMAL HIGH (ref 4.0–10.5)
WBC: 20.7 10*3/uL — ABNORMAL HIGH (ref 4.0–10.5)

## 2012-06-23 LAB — PROTIME-INR
INR: 1.77 — ABNORMAL HIGH (ref 0.00–1.49)
Prothrombin Time: 20 seconds — ABNORMAL HIGH (ref 11.6–15.2)

## 2012-06-23 LAB — COMPREHENSIVE METABOLIC PANEL
Albumin: 3.2 g/dL — ABNORMAL LOW (ref 3.5–5.2)
BUN: 14 mg/dL (ref 6–23)
Calcium: 9.1 mg/dL (ref 8.4–10.5)
Creatinine, Ser: 1.28 mg/dL (ref 0.50–1.35)
GFR calc Af Amer: 72 mL/min — ABNORMAL LOW (ref >90)
Glucose, Bld: 337 mg/dL — ABNORMAL HIGH (ref 70–99)
Total Protein: 6.2 g/dL (ref 6.0–8.3)

## 2012-06-23 LAB — GLUCOSE, CAPILLARY
Glucose-Capillary: 194 mg/dL — ABNORMAL HIGH (ref 70–99)
Glucose-Capillary: 158 mg/dL — ABNORMAL HIGH (ref 70–99)

## 2012-06-23 LAB — MRSA PCR SCREENING: MRSA by PCR: NEGATIVE

## 2012-06-23 LAB — URINE MICROSCOPIC-ADD ON

## 2012-06-23 MED ORDER — NOREPINEPHRINE BITARTRATE 1 MG/ML IJ SOLN
2.0000 ug/min | INTRAMUSCULAR | Status: DC
  Administered 2012-06-23: 10 ug/min via INTRAVENOUS
  Filled 2012-06-23: qty 4

## 2012-06-23 MED ORDER — HYDROCORTISONE SOD SUCCINATE 100 MG IJ SOLR
50.0000 mg | Freq: Four times a day (QID) | INTRAMUSCULAR | Status: DC
  Administered 2012-06-23 – 2012-06-25 (×7): 50 mg via INTRAVENOUS
  Filled 2012-06-23 (×11): qty 1

## 2012-06-23 MED ORDER — ASPIRIN 300 MG RE SUPP: 300.0000 mg | RECTAL | Status: AC

## 2012-06-23 MED ORDER — ETOMIDATE 2 MG/ML IV SOLN
20.0000 mg | Freq: Once | INTRAVENOUS | Status: AC
  Administered 2012-06-23: 20 mg via INTRAVENOUS

## 2012-06-23 MED ORDER — BIOTENE DRY MOUTH MT LIQD
15.0000 mL | Freq: Four times a day (QID) | OROMUCOSAL | Status: DC
  Administered 2012-06-23 – 2012-06-25 (×8): 15 mL via OROMUCOSAL

## 2012-06-23 MED ORDER — PANTOPRAZOLE SODIUM 40 MG IV SOLR
40.0000 mg | Freq: Every day | INTRAVENOUS | Status: DC
  Administered 2012-06-23 – 2012-06-24 (×2): 40 mg via INTRAVENOUS
  Filled 2012-06-23 (×4): qty 40

## 2012-06-23 MED ORDER — NOREPINEPHRINE BITARTRATE 1 MG/ML IJ SOLN
INTRAMUSCULAR | Status: AC
  Filled 2012-06-23: qty 4

## 2012-06-23 MED ORDER — NOREPINEPHRINE BITARTRATE 1 MG/ML IJ SOLN
2.0000 ug/min | INTRAVENOUS | Status: AC
  Administered 2012-06-23: 20 ug/min via INTRAVENOUS
  Filled 2012-06-23: qty 16

## 2012-06-23 MED ORDER — SODIUM CHLORIDE 0.9 % IV SOLN
250.0000 mL | Freq: Once | INTRAVENOUS | Status: AC
  Administered 2012-06-23: 15:00:00 via INTRAVENOUS

## 2012-06-23 MED ORDER — SODIUM CHLORIDE 0.9 % IV SOLN: 250.0000 mL | INTRAVENOUS | Status: DC | PRN

## 2012-06-23 MED ORDER — MIDAZOLAM BOLUS VIA INFUSION
1.0000 mg | INTRAVENOUS | Status: DC | PRN
  Filled 2012-06-23: qty 2

## 2012-06-23 MED ORDER — FENTANYL CITRATE 0.05 MG/ML IJ SOLN
INTRAMUSCULAR | Status: AC
  Filled 2012-06-23: qty 2

## 2012-06-23 MED ORDER — ROCURONIUM BROMIDE 50 MG/5ML IV SOLN
1.0000 mg/kg | Freq: Once | INTRAVENOUS | Status: AC
  Administered 2012-06-23: 50 mg via INTRAVENOUS

## 2012-06-23 MED ORDER — FENTANYL BOLUS VIA INFUSION
25.0000 ug | Freq: Four times a day (QID) | INTRAVENOUS | Status: DC | PRN
  Filled 2012-06-23: qty 100

## 2012-06-23 MED ORDER — FENTANYL CITRATE 0.05 MG/ML IJ SOLN
100.0000 ug | Freq: Once | INTRAMUSCULAR | Status: AC
  Administered 2012-06-23: 100 ug via INTRAVENOUS

## 2012-06-23 MED ORDER — NOREPINEPHRINE BITARTRATE 1 MG/ML IJ SOLN
2.0000 ug/min | INTRAVENOUS | Status: DC
  Administered 2012-06-23: 20 ug/min via INTRAVENOUS
  Administered 2012-06-24: 70 ug/min via INTRAVENOUS
  Administered 2012-06-24: 35 ug/min via INTRAVENOUS
  Filled 2012-06-23 (×4): qty 16

## 2012-06-23 MED ORDER — SODIUM CHLORIDE 0.9 % IV SOLN
25.0000 ug/h | INTRAVENOUS | Status: DC
  Administered 2012-06-23: 200 ug/h via INTRAVENOUS
  Administered 2012-06-24: 150 ug/h via INTRAVENOUS
  Administered 2012-06-24: 200 ug/h via INTRAVENOUS
  Filled 2012-06-23 (×3): qty 50

## 2012-06-23 MED ORDER — TENECTEPLASE 50 MG IV KIT
50.0000 mg | PACK | Freq: Once | INTRAVENOUS | Status: AC
  Administered 2012-06-23: 50 mg via INTRAVENOUS
  Filled 2012-06-23: qty 10

## 2012-06-23 MED ORDER — PROPOFOL 10 MG/ML IV EMUL
5.0000 ug/kg/min | Freq: Once | INTRAVENOUS | Status: AC
  Administered 2012-06-23: 10 ug/kg/min via INTRAVENOUS

## 2012-06-23 MED ORDER — EPINEPHRINE HCL 1 MG/ML IJ SOLN: 0.5000 ug/min | INTRAVENOUS | Status: DC

## 2012-06-23 MED ORDER — HEPARIN (PORCINE) IN NACL 100-0.45 UNIT/ML-% IJ SOLN
1900.0000 [IU]/h | INTRAMUSCULAR | Status: DC
  Administered 2012-06-23: 1000 [IU]/h via INTRAVENOUS
  Administered 2012-06-24: 1800 [IU]/h via INTRAVENOUS
  Administered 2012-06-25: 1850 [IU]/h via INTRAVENOUS
  Administered 2012-06-26: 1900 [IU]/h via INTRAVENOUS
  Filled 2012-06-23 (×6): qty 250

## 2012-06-23 MED ORDER — DEXTROSE 5 % IV SOLN
INTRAVENOUS | Status: AC
  Filled 2012-06-23: qty 250

## 2012-06-23 MED ORDER — CHLORHEXIDINE GLUCONATE 0.12 % MT SOLN
15.0000 mL | Freq: Two times a day (BID) | OROMUCOSAL | Status: DC
  Administered 2012-06-23 – 2012-06-25 (×4): 15 mL via OROMUCOSAL
  Filled 2012-06-23 (×4): qty 15

## 2012-06-23 MED ORDER — SODIUM CHLORIDE 0.9 % IV BOLUS (SEPSIS): 1000.0000 mL | Freq: Once | INTRAVENOUS | Status: DC

## 2012-06-23 MED ORDER — SODIUM CHLORIDE 0.9 % IV BOLUS (SEPSIS)
1000.0000 mL | Freq: Once | INTRAVENOUS | Status: AC
  Administered 2012-06-23: 1000 mL via INTRAVENOUS

## 2012-06-23 MED ORDER — ASPIRIN 81 MG PO CHEW
324.0000 mg | CHEWABLE_TABLET | ORAL | Status: AC
  Administered 2012-06-23: 324 mg via ORAL
  Filled 2012-06-23: qty 4

## 2012-06-23 MED ORDER — SUCCINYLCHOLINE CHLORIDE 20 MG/ML IJ SOLN
150.0000 mg | Freq: Once | INTRAMUSCULAR | Status: AC
  Administered 2012-06-23: 150 mg via INTRAVENOUS

## 2012-06-23 MED ORDER — SODIUM BICARBONATE 8.4 % IV SOLN
INTRAVENOUS | Status: AC
  Administered 2012-06-23: 14:00:00
  Filled 2012-06-23: qty 50

## 2012-06-23 MED ORDER — SODIUM CHLORIDE 0.9 % IV SOLN
3.0000 g | Freq: Four times a day (QID) | INTRAVENOUS | Status: AC
  Administered 2012-06-23 – 2012-06-30 (×25): 3 g via INTRAVENOUS
  Filled 2012-06-23 (×29): qty 3

## 2012-06-23 MED ORDER — PROPOFOL 10 MG/ML IV EMUL
INTRAVENOUS | Status: AC
  Administered 2012-06-23: 10 ug/kg/min via INTRAVENOUS
  Filled 2012-06-23: qty 100

## 2012-06-23 MED ORDER — INSULIN ASPART 100 UNIT/ML ~~LOC~~ SOLN
0.0000 [IU] | SUBCUTANEOUS | Status: DC
  Administered 2012-06-23: 3 [IU] via SUBCUTANEOUS
  Administered 2012-06-24 (×5): 2 [IU] via SUBCUTANEOUS
  Administered 2012-06-25: 1 [IU] via SUBCUTANEOUS

## 2012-06-23 MED ORDER — ROCURONIUM BROMIDE 50 MG/5ML IV SOLN
50.0000 mg | Freq: Once | INTRAVENOUS | Status: AC
  Administered 2012-06-23: 50 mg via INTRAVENOUS

## 2012-06-23 MED ORDER — SODIUM CHLORIDE 0.9 % IV SOLN
1.0000 mg/h | INTRAVENOUS | Status: DC
  Administered 2012-06-23: 2 mg/h via INTRAVENOUS
  Administered 2012-06-24: 4 mg/h via INTRAVENOUS
  Filled 2012-06-23 (×4): qty 10

## 2012-06-23 MED ORDER — SODIUM BICARBONATE 8.4 % IV SOLN
INTRAVENOUS | Status: AC
  Administered 2012-06-23: 13:00:00
  Filled 2012-06-23: qty 50

## 2012-06-23 MED ORDER — EPINEPHRINE HCL 0.1 MG/ML IJ SOLN
INTRAMUSCULAR | Status: AC
  Administered 2012-06-23: 13:00:00
  Filled 2012-06-23: qty 10

## 2012-06-23 MED ORDER — VASOPRESSIN 20 UNIT/ML IJ SOLN
0.0300 [IU]/min | INTRAVENOUS | Status: DC
  Administered 2012-06-23: 0.03 [IU]/min via INTRAVENOUS
  Filled 2012-06-23: qty 2.5

## 2012-06-23 MED ORDER — ALTEPLASE (PULMONARY EMBOLISM) INFUSION
100.0000 mg | INTRAVENOUS | Status: DC
  Filled 2012-06-23: qty 100

## 2012-06-23 MED ORDER — SODIUM CHLORIDE 0.9 % IV SOLN
INTRAVENOUS | Status: DC
  Administered 2012-06-23: 16:00:00 via INTRAVENOUS
  Administered 2012-06-24: 150 mL/h via INTRAVENOUS

## 2012-06-23 NOTE — Progress Notes (Signed)
Tracheal Aspirate obtained /sent to Lab

## 2012-06-23 NOTE — H&P (Signed)
PULMONARY  / CRITICAL CARE MEDICINE  Name: Matthew Juarez MRN: [id number] DOB: [birth year]    ADMISSION DATE:  06/23/2012  REFERRING MD :  EDP - Dr. Jeraldine Loots  CHIEF COMPLAINT:  Cardiac Arrest  BRIEF PATIENT DESCRIPTION: 52 y/o M with PMH of HTN, Gout, with recent admit 1/21-2/11 to CIR for Cervicothoracic transverse myelitis (positive neuromyelitis optica antibody), R distal femur fx, R DVT.  Presented to [facility] ER 3/26 after PEA arrest with concern for PE.  PCCM called for ICU admit.    SIGNIFICANT EVENTS / STUDIES:  1/21 to 2/11 - Admit to CIR for weakness, R distal femur fx, R DVT, Cervicothoracic transverse myelitis, T8 with Brown-Sequard clinical  presentation with positive neuromyelitis optica antibody. ............................................................................................................................... 3/26 - Admit to [facility] with pre-syncopal episode, subsequent PEA arrest with CPR for 17 minutes  LINES / TUBES: 3/26 OETT>>> 3/26 R IJ TLC>>> 3/26 R Fem Aline>>>   CULTURES: 3/26 BCx2>>> 3/26 UA>>> 3/26 UC>>> 3/26 Sputum>>>  ANTIBIOTICS:   HISTORY OF PRESENT ILLNESS:  52 y/o M with PMH of HTN, Gout, & chronic back pain with recent admit 1/18 to [facility] with a 5 day history of progressive LE weakness and fall with injury to R ankle.  MRI of the spine showed intrinsic intramedullary cord lesion with prolonged T2 signal and variable abnormal enhancement. Finding consistent with idiopathic acute transverse myelitis. X-rays of right ankle showed distal fibular fracture.  Was then admitted 1/21-2/11 to [facility] for Cervicothoracic transverse myelitis (positive neuromyelitis optica antibody), R distal femur fx, R DVT.  Follow up LE doppler in 05/2012 was limited due to cast but did not show DVT in femoral vein. Presented to [facility] ER 3/26 after via EMS after onset of dizziness & near syncope after a warm bath.  On EMS arrival, pt was awake and talking, was pale  with thready radial pulse then lost pulses in the truck with PEA on monitor. Required CPR from 1102-1110 and again [id number].  Given 1 epi PTA. Regained pulses intermittently enroute, + pulses on arrival to ER, HR 120 on monitor. Work up in ER noted hypothermia, profound hypotension, hypoxia with sats in 70's.  Concern for PE,  PCCM called for ICU admit.    PAST MEDICAL HISTORY :  Past Medical History  Diagnosis Date  . Chronic back pain   . Hypertension   . Gout   . Headache   . Arthritis    History reviewed. No pertinent past surgical history. Prior to Admission medications   Medication Sig Start Date End Date Taking? Authorizing Provider  allopurinol (ZYLOPRIM) 300 MG tablet Take 1 tablet (300 mg total) by mouth daily. 05/11/12   Mcarthur Rossetti Angiulli, PA-C  amLODipine-olmesartan (AZOR) 5-20 MG per tablet Take 1 tablet by mouth daily. 05/11/12   Mcarthur Rossetti Angiulli, PA-C  bisacodyl (DULCOLAX) 10 MG suppository Place 1 suppository (10 mg total) rectally daily at 6 (six) AM. 05/11/12   Mcarthur Rossetti Angiulli, PA-C  Cholecalciferol (VITAMIN D PO) Take 1 tablet by mouth daily.    Historical Provider, MD  Coenzyme Q10 (CO Q-10) 200 MG CAPS Take 1 capsule by mouth daily. 05/11/12   Mcarthur Rossetti Angiulli, PA-C  enoxaparin (LOVENOX) 40 MG/0.4ML injection Inject 0.4 mLs (40 mg total) into the skin daily. 05/11/12   Mcarthur Rossetti Angiulli, PA-C  gabapentin (NEURONTIN) 300 MG capsule Take 1 capsule (300 mg total) by mouth 3 (three) times daily. 05/11/12   Mcarthur Rossetti Angiulli, PA-C  HYDROcodone-acetaminophen (NORCO/VICODIN) 5-325 MG per  tablet Take 1-2 tablets by mouth every 4 (four) hours as needed. 05/11/12   Mcarthur Rossetti Angiulli, PA-C  senna-docusate (SENOKOT-S) 8.6-50 MG per tablet Take 2 tablets by mouth at bedtime. 05/11/12   Mcarthur Rossetti Angiulli, PA-C   No Known Allergies  FAMILY HISTORY:  Family History  Problem Relation Age of Onset  . Lung cancer Maternal Uncle   . Colon cancer Maternal Aunt    SOCIAL HISTORY:   reports that he has never smoked. He does not have any smokeless tobacco history on file. He reports that  drinks alcohol. He reports that he does not use illicit drugs.  REVIEW OF SYSTEMS:  Unable to complete as pt is altered on vent.   SUBJECTIVE: RN reports hypotension, mild combativeness on admit, moving upper ext's Intubated on propofol on our arrival with BP 70s, intermittent agitation  VITAL SIGNS: Temp:  [93.7 F (34.3 C)] 93.7 F (34.3 C) (03/26 1145) Pulse Rate:  [120-122] 122 (03/26 1146) Resp:  [14-17] 14 (03/26 1146) BP: (91)/(70) 91/70 mmHg (03/26 1146) SpO2:  [90 %] 90 % (03/26 1145) FiO2 (%):  [100 %] 100 % (03/26 1141) Weight:  [290 lb (131.543 kg)] 290 lb (131.543 kg) (03/26 1143)  HEMODYNAMICS:    VENTILATOR SETTINGS: Vent Mode:  [-] PRVC FiO2 (%):  [100 %] 100 % Set Rate:  [14 bmp] 14 bmp Vt Set:  [640 mL-700 mL] 700 mL PEEP:  [5 cmH20] 5 cmH20 Plateau Pressure:  [18 cmH20] 18 cmH20  INTAKE / OUTPUT: Intake/Output     03/25 0701 - 03/26 0700 03/26 0701 - 03/27 0700   I.V. (mL/kg)  1000 (7.6)   Total Intake(mL/kg)  1000 (7.6)   Net   +1000          PHYSICAL EXAMINATION: General:  Chronically ill on vent Neuro:  Pupils initially dilated , then 3mm sluggish RTL, moving upper ext's, fixed gaze HEENT:  OETT, mm pink/moist, no jvd Cardiovascular:  s1s2 rrr, no m/r/g Lungs:  resp's even/non-labored, lungs bilaterally clear, no bronchospasm Abdomen:  Obese/soft, bsx4 hypoactive Musculoskeletal:  No acute deformities, ? Foot drop bilaterally Skin:  Cool, pale, dry, no edema, bruising noted to R thigh  LABS:  Recent Labs Lab 06/23/12 1154 06/23/12 1320  HGB 12.9*  --   WBC 18.3*  --   PLT 134*  --   NA 133*  --   K 5.6*  --   CL 93*  --   CO2 16*  --   GLUCOSE 337*  --   BUN 14  --   CREATININE 1.28  --   CALCIUM 9.1  --   AST 218*  --   ALT 200*  --   ALKPHOS 129*  --   BILITOT 1.2  --   PROT 6.2  --   ALBUMIN 3.2*  --   LATICACIDVEN  13.0*  --   PHART  --  7.211*  PCO2ART  --  68.5*  PO2ART  --  103.0*   No results found for this basename: GLUCAP,  in the last 168 hours  CXR: 3/26 - ETT in good position, mild vascular congestion, no focal infiltrate  ASSESSMENT / PLAN:  PULMONARY A: Acute Respiratory Failure / Acidosis Hypoxia Rule out PE - concern with pre-syncopal episode, hypoxia, hx of DVT  P:   -full vent support, -f/u abg now and in am -f/u cxr in am -full dose heparin, consider TPA -CTA Chest once stabilized, duplex BLE  -adjust vent settings  CARDIOVASCULAR  A:  Cardiac Arrest - initial rhythm PEA with approx 17 minutes CPR with ROSC.   Hypotension - in setting of above, concern for PE  P:  -epi / levophed gtt for MAP >65, epi IV 1 given once a-line obtained for low BP -EKG -tele monitor / 2900 admit -stress steroids  RENAL A:   Metabolic/ Lactic Acidosis + resp acidosis - in setting of arrest Hyperkalemia - mild, in setting of acidosis  P:   -volume resuscitation, adequate MAP -Increase RR to 24 to correct resp component  GASTROINTESTINAL A:   Elevated LFT's / Alk Phos - likely in setting of hypotension Gout   P:   -assess LFT's, Alk phos in am -SUP proph with protonix -OGT -hold home allopurinol   HEMATOLOGIC A:   Anemia Thrombocytopenia  Hx of DVT - recent dvt in 03/2012, on single dose 40 mg lovenox at home Concern for PE - see above  P:  -monitor for bleeding  -follow h/h -See Pulm Section -full dose heparin  INFECTIOUS A:   No clear source infection   P:   -pan culture, low threshold to add Abx for UTI/ aspiration - unasyn  ENDOCRINE A:   Hyperglycemia   P:   -SSI protocol  NEUROLOGIC A:   Acute Encephalopathy - in setting of arrest.  Moving uppers with good strength. Hx of Transverse Myelitis - followed by Shirlean Schlein, MD at [facility] [phone].   P:   -supportive care -will not initiate hypothermia given co-morbidities & high  clinical prob of PE that may require anticoagulation/ thrombolysis   Updated family in detail   Canary Brim, NP-C [facility] Pgr: [phone] or [phone]    I have personally obtained a history, examined the patient, evaluated laboratory and imaging results, formulated the assessment and plan and placed orders.  CRITICAL CARE: The patient is critically ill with multiple organ systems failure and requires high complexity decision making for assessment and support, frequent evaluation and titration of therapies, application of advanced monitoring technologies and extensive interpretation of multiple databases. Critical Care Time devoted to patient care services described in this note is  60 minutes (12-15- 1.15).   Oretha Milch  Pulmonary and Critical Care Medicine [facility] Pager: [phone]  06/23/2012, 1:37 PM

## 2012-06-23 NOTE — Procedures (Signed)
Arterial Catheter Insertion Procedure Note MINAS BONSER [id number] Oct 15, 1960  Procedure: Insertion of Arterial Catheter  Indications: Blood pressure monitoring and Frequent blood sampling  Procedure Details Consent: Risks of procedure as well as the alternatives and risks of each were explained to the (patient/caregiver).  Consent for procedure obtained. Time Out: Verified patient identification, verified procedure, site/side was marked, verified correct patient position, special equipment/implants available, medications/allergies/relevent history reviewed, required imaging and test results available.  Performed  Maximum sterile technique was used including antiseptics, cap, gloves, gown, hand hygiene, mask and sheet. Skin prep: Chlorhexidine; local anesthetic administered 20 gauge catheter was inserted into right femoral artery using the Seldinger technique.  Evaluation Blood flow good; BP tracing good. Complications: No apparent complications.   Brett Canales Minor ACNP [facility] PCCM Pager [phone] till 3 pm If no answer page [phone]  Ultrasound used for site verification, live visualisation of needle entry & guidewire prior to dilation  I was present for & supervised the entire procedure   Kareen Hitsman V.   06/23/2012, 1:29 PM

## 2012-06-23 NOTE — Progress Notes (Signed)
ANTICOAGULATION CONSULT NOTE - Initial Consult  Pharmacy Consult for Heparin Indication: r/o pulmonary embolus and history of recent DVT  No Known Allergies  Patient Measurements: Height: 6\' 1"  (185.4 cm) Weight: 290 lb (131.543 kg) IBW/kg (Calculated) : 79.9 Heparin Dosing Weight: 109 kg  Vital Signs: Temp: 93.7 F (34.3 C) (03/26 1145) BP: 91/70 mmHg (03/26 1146) Pulse Rate: 122 (03/26 1146)  Labs:  Recent Labs  06/23/12 1154  HGB 12.9*  HCT 39.9  PLT 134*  CREATININE 1.28    Estimated Creatinine Clearance: 94.9 ml/min (by C-G formula based on Cr of 1.28).   Medical History: Past Medical History  Diagnosis Date  . Chronic back pain   . Hypertension   . Gout   . Headache   . Arthritis     Medications:  Infusions:  . sodium chloride    . sodium chloride    . dextrose    . fentaNYL infusion INTRAVENOUS    . midazolam (VERSED) infusion     And  . midazolam    . norepinephrine (LEVOPHED) Adult infusion    . [COMPLETED] propofol 15 mcg/kg/min (06/23/12 1209)  . [COMPLETED] sodium chloride 1,000 mL (06/23/12 1205)    Assessment: 52 year old male with a recent admission for cervicothoracic transverse myelitis and treated with Lovenox for a DVT during that admission (1/21-2/11).  Per Dopplers his DVT had resolved and he was discharged on Lovenox 40mg  SQ q24h.  He presents today with dizziness and near syncope that was followed by PEA arrest.  To begin anticoagulation with Heparin for r/o PE.  I do not know when his last dose of Lovenox was given but I do not anticipate it would contribute significantly to any anticoagulation at a dose of 0.3 mg/kg/day.  Note he is currently receiving a dose of TNKase for lysis.  Goal of Therapy:  Heparin level 0.3-0.7 units/ml Monitor platelets by anticoagulation protocol: Yes   Plan:  Check PTT, PT/INR, and CBC 30 minutes after giving TNKase. Will start Heparin when PTT is <80 seconds.  Estella Husk, Pharm.D.,  BCPS Clinical Pharmacist Phone: [phone] or [phone] Pager: [phone] 06/23/2012, 1:18 PM

## 2012-06-23 NOTE — Progress Notes (Signed)
  Echocardiogram 2D Echocardiogram has been performed.  Matthew Juarez 06/23/2012, 1:26 PM

## 2012-06-23 NOTE — Progress Notes (Signed)
ANTIBIOTIC CONSULT NOTE - INITIAL  Pharmacy Consult for unasyn Indication: R/o UTI/Asp PNA  No Known Allergies  Patient Measurements: Height: 6\' 1"  (185.4 cm) Weight: 290 lb (131.543 kg) IBW/kg (Calculated) : 79.9  Vital Signs: Temp: 95.9 F (35.5 C) (03/26 1415) BP: 126/76 mmHg (03/26 1415) Pulse Rate: 131 (03/26 1415) Intake/Output from previous day:   Intake/Output from this shift: Total I/O In: 2000 [I.V.:2000] Out: -   Labs:  Recent Labs  06/23/12 1154  WBC 18.3*  HGB 12.9*  PLT 134*  CREATININE 1.28   Estimated Creatinine Clearance: 94.9 ml/min (by C-G formula based on Cr of 1.28). No results found for this basename: VANCOTROUGH, VANCOPEAK, VANCORANDOM, GENTTROUGH, GENTPEAK, GENTRANDOM, TOBRATROUGH, TOBRAPEAK, TOBRARND, AMIKACINPEAK, AMIKACINTROU, AMIKACIN,  in the last 72 hours   Microbiology: No results found for this or any previous visit (from the past 720 hour(s)).  Medical History: Past Medical History  Diagnosis Date  . Chronic back pain   . Hypertension   . Gout   . Headache   . Arthritis     Medications:  Scheduled:  . aspirin  324 mg Oral NOW   Or  . aspirin  300 mg Rectal NOW  . [COMPLETED] EPINEPHrine      . [COMPLETED] etomidate  20 mg Intravenous Once  . fentaNYL      . fentaNYL      . [COMPLETED] fentaNYL  100 mcg Intravenous Once  . hydrocortisone sod succinate (SOLU-CORTEF) injection  50 mg Intravenous Q6H  . norepinephrine      . pantoprazole (PROTONIX) IV  40 mg Intravenous QHS  . [COMPLETED] rocuronium  1 mg/kg Intravenous Once  . [COMPLETED] rocuronium  50 mg Intravenous Once  . [COMPLETED] sodium bicarbonate      . [COMPLETED] sodium bicarbonate      . [COMPLETED] succinylcholine  150 mg Intravenous Once  . [COMPLETED] tenecteplase  50 mg Intravenous Once   Assessment: 52 yo with suspected PE who had prolong CPR. Empiric unasyn to r/o possible aspiration.   Plan:   Unasyn 3g IV q6  Ulyses Southward  [place] 06/23/2012,2:34 PM

## 2012-06-23 NOTE — Progress Notes (Signed)
eLink Physician-Brief Progress Note Patient Name: Matthew Juarez DOB: 08/15/1960 MRN: [id number]  Date of Service  06/23/2012   HPI/Events of Note   Refractory shock  eICU Interventions   Vasopressin ordered NS 1000 x 1    Intervention Category Major Interventions: Shock - evaluation and management  [name] 06/23/2012, 9:45 PM

## 2012-06-23 NOTE — Progress Notes (Signed)
Further drop in BP requiring epi IV - epi drip ordered, maxed out on levophed Pt was intermittently severely agitated after propofol turned off for  low BP. Fent bolus given, finally had to be paralysed with rocuronium to prevent self extubation. Echo reviewed at bedside - RV appears enlarged with high RVSP estimated. Discussed with wife & thrombolytic orders given. Explained to wife that evidence for PE here is circumstantial but given life threatening current state , will proceed with thrombolysis. Risks of bleeding including intracranial bleed were discussed. He is too unstable to obtain definitive study or obtain head CT.  Additional CC time x 30 mins. Dr Molli Knock to take over at this time.  Sargent Mankey V.

## 2012-06-23 NOTE — ED Notes (Signed)
Pt hypotensive,NS boluses given, CCM placed central and art lines at approx 1300. Family at bedside 1345. Called report to 2900 at 1410, blood drawn from central line 1415. Bedside echo done approx 1315.

## 2012-06-23 NOTE — Care Management Note (Addendum)
    Page 1 of 2   07/06/2012     4:23:30 PM   CARE MANAGEMENT NOTE 07/06/2012  Patient:  Matthew Juarez, Matthew Juarez   Account Number:  [id number]  Date Initiated:  06/23/2012  Documentation initiated by:  Junius Creamer  Subjective/Objective Assessment:   adm w resp failure, pl effusion 4/2  admit- lives with spouse.     Action/Plan:   lives w wife, pcp dr Selena Batten [place]  pt eval- rec hhpt on 4/4   Anticipated DC Date:  07/06/2012   Anticipated DC Plan:  HOME W HOME HEALTH SERVICES  In-house referral  Clinical Social Worker      DC Associate Professor  CM consult      [facility] Choice  HOME HEALTH  Resumption Of Svcs/PTA Provider   Choice offered to / List presented to:  C-1 Patient        HH arranged  HH-1 RN  HH-2 PT  HH-3 OT  HH-4 NURSE'S AIDE      HH agency  [facility]   Status of service:  Completed, signed off Medicare Important Message given?   (If response is "NO", the following Medicare IM given date fields will be blank) Date Medicare IM given:   Date Additional Medicare IM given:    Discharge Disposition:  HOME W HOME HEALTH SERVICES  Per UR Regulation:  Reviewed for med. necessity/level of care/duration of stay  If discussed at Long Length of Stay Meetings, dates discussed:   06/29/2012  07/01/2012    Comments:  07/06/12 12:09 Letha Cape RN, BSN [phone] patient for discharge today, hh services set up with [facility], and informed patient that vancomycin solution is ready at [facility] pharmacy across the street, the amount is $40. Patient's wife will go to pick up Vancomycin .  [facility] notified that pt will be discharged today.  Patient will be dc after his last lovenox injection today, and has his vancomycin solution awaiting him in the refrigerator.  07/05/12 15:14 Letha Cape RN, BSN [phone] patient is for dc tomorrow, will need oral vancomycin, family wants script faxed to [facility] [phone]. I called to see if they received fax and they have.  Also  awaiting to see if insurance will need prior authorization and what the co pay would be.  07/02/12 14:16 Letha Cape RN, BSN [phone] patient is on coumadin inr is not at goal yet.  Plan is for dc on Monday home with [facility], PT, OT and aide.  Referral made to [facility], Pam notified.  Soc will begin 24-48 hrs post discharge.  NCM will continue to follow for dc needs.  07/01/12 16:25 Letha Cape RN, BSN [phone] patient has a pl effusion, had thorancetesis today, per physical therapy since patient is not a CIR candidate they are rec snf.  CSW referral.  3/26 1556 debbie dowell rn,bsn

## 2012-06-23 NOTE — ED Notes (Signed)
Levophed titrated down to , started versed drip at 2mg /hr

## 2012-06-23 NOTE — Progress Notes (Signed)
VASCULAR LAB PRELIMINARY  PRELIMINARY  PRELIMINARY  PRELIMINARY  Bilateral lower extremity venous duplex  completed.    Preliminary report:  Right:  DVT noted in the popliteal vein and PTV and peroneal veins to mid calf.  No evidence of superficial thrombosis.  No Baker's cyst.Left:  No evidence of DVT, superficial thrombosis, or Baker's cyst.  Matthew Juarez, RVT 06/23/2012, 5:26 PM

## 2012-06-23 NOTE — ED Notes (Signed)
Pt transported on vent to 2900.

## 2012-06-23 NOTE — Progress Notes (Signed)
Call to CCM Elink to report pts hypotension, nurse reported giving bolus, and increasing levophed.

## 2012-06-23 NOTE — Progress Notes (Signed)
Responded to trauma page CPR In progress. Per pt. wife Pt.was taking a shower and became dizzy and short of breathe. I escoredt family to consultation room and provided prayer,encouragement, emotional and spiritual support to pt.family.  Pt. is now intubated and going to 2904. Pt. Wife, mother, sister and other family rotating at bedside. I accompanied Doctor to family waiting area for family update.  Promoted information sharing between staff and family and remained with family until pt.  bed assigment.  Will follow as needed and pass on to Unit Chaplain for f/u support.    06/23/12 1300  Clinical Encounter Type  Visited With Patient;Family;Health care provider  Visit Type Spiritual support;Critical Care;Trauma  Referral From Nurse  Spiritual Encounters  Spiritual Needs Prayer;Emotional  Stress Factors  Family Stress Factors Family relationships;Health changes

## 2012-06-23 NOTE — ED Notes (Signed)
Pt arrived by [facility], onset of dizziness and near sycncope after taking a warm bath. On ems arrival, pt was awake and talking, was pale and no radial pulse then lost pulses, PEA on monitor. Given 1 epi pta. Regained pulses intermittently enroute, +pulses on arrival to ed, HR 120 on monitor.

## 2012-06-23 NOTE — Progress Notes (Addendum)
ANTICOAGULATION CONSULT NOTE - Initial Consult  Pharmacy Consult for Heparin Indication: pulmonary embolus  No Known Allergies  Patient Measurements: Height: 6\' 1"  (185.4 cm) Weight: 290 lb (131.543 kg) IBW/kg (Calculated) : 79.9 Heparin Dosing Weight: 109.4 kg  Vital Signs: Temp: 95.7 F (35.4 C) (03/26 1430) BP: 121/80 mmHg (03/26 1430) Pulse Rate: 132 (03/26 1430)  Labs:  Recent Labs  06/23/12 1154 06/23/12 1415  HGB 12.9* 11.6*  HCT 39.9 34.0*  PLT 134* 108*  APTT  --  58*  LABPROT  --  20.0*  INR  --  1.77*  CREATININE 1.28  --     Estimated Creatinine Clearance: 94.9 ml/min (by C-G formula based on Cr of 1.28).   Medical History: Past Medical History  Diagnosis Date  . Chronic back pain   . Hypertension   . Gout   . Headache   . Arthritis     Medications:  Infusions:  . sodium chloride    . epinephrine    . fentaNYL infusion INTRAVENOUS    . midazolam (VERSED) infusion 2 mg/hr (06/23/12 1445)  . norepinephrine (LEVOPHED) Adult infusion    . [DISCONTINUED] norepinephrine (LEVOPHED) Adult infusion 25 mcg/min (06/23/12 1422)    Assessment: 52 y/o male with recent admit for cervicothoracic traverse myelitis, R femur fracture, and R DVT. He presents to the ED today after PEA arrest with concern for PE. TNKase 50 mg IV was given at 13:43. Pharmacy consulted to begin heparin with no bolus when the aPTT <80 sec. The aPTT is currently 58 sec so will begin heparin. No bleeding noted, H/H and platelets are low on admission.  Goal of Therapy:  Heparin level 0.3-0.5 units/ml for the first 24 hours, then 0.3-0.7 units/ml Monitor platelets by anticoagulation protocol: Yes   Plan:  -Begin heparin drip at 1000 units/hr with no bolus (max initial rate s/p TNKase) -Heparin level 6 hours after started -Daily heparin level and CBC while on heparin -Monitor for signs/symptoms of bleeding  [facility], [place].D., BCPS Clinical Pharmacist Pager:  [phone] 06/23/2012 3:20 PM

## 2012-06-23 NOTE — ED Provider Notes (Signed)
History     CSN: [id number]  Arrival date & time 06/23/12  1131   First MD Initiated Contact with Patient 06/23/12 1148      Chief Complaint  Patient presents with  . Cardiac Arrest    (Consider location/radiation/quality/duration/timing/severity/associated sxs/prior treatment) HPI The patient presents in extremis DES.  Per report the patient called 911 after feeling particularly weak or lightheaded. Per EMS on their arrival the patient was awake, interactive, but diaphoretic.  Soon after initiating transfer the patient lost pulses, and PEA was present on the monitor.  After CPR the patient regained pulses.  This cycle repeated once, and prior to arrival the patient regained pulses.  On arrival the patient cannot provide any details of the history of present illness secondary to his condition. Per EMS the patient had PEA each time he lost pulses, and upon return pulses there was an irregular rhythm.   Past Medical History  Diagnosis Date  . Chronic back pain   . Hypertension   . Gout   . Headache   . Arthritis     History reviewed. No pertinent past surgical history.  Family History  Problem Relation Age of Onset  . Lung cancer Maternal Uncle   . Colon cancer Maternal Aunt     History  Substance Use Topics  . Smoking status: Never Smoker   . Smokeless tobacco: Not on file  . Alcohol Use: Yes      Review of Systems  Unable to perform ROS: Mental status change    Allergies  Review of patient's allergies indicates no known allergies.  Home Medications   Current Outpatient Rx  Name  Route  Sig  Dispense  Refill  . allopurinol (ZYLOPRIM) 300 MG tablet   Oral   Take 1 tablet (300 mg total) by mouth daily.   30 tablet   1   . amLODipine-olmesartan (AZOR) 5-20 MG per tablet   Oral   Take 1 tablet by mouth daily.         . Cholecalciferol (VITAMIN D) 2000 UNITS CAPS   Oral   Take 2,000 Units by mouth daily.         . Coenzyme Q10 (CO Q-10) 200 MG  CAPS   Oral   Take 1 capsule by mouth daily.   30 each   1   . esomeprazole (NEXIUM) 20 MG capsule   Oral   Take 20 mg by mouth daily before breakfast.         . gabapentin (NEURONTIN) 300 MG capsule   Oral   Take 600 mg by mouth 3 (three) times daily.         Marland Kitchen HYDROcodone-acetaminophen (NORCO/VICODIN) 5-325 MG per tablet   Oral   Take 1-2 tablets by mouth every 4 (four) hours as needed for pain.           BP 118/73  Pulse 130  Temp(Src) 97.3 F (36.3 C)  Resp 25  Ht 6\' 1"  (1.854 m)  Wt 290 lb (131.543 kg)  BMI 38.27 kg/m2  SpO2 100%  Physical Exam  Nursing note and vitals reviewed. Constitutional:  Large male in extremis  HENT:  Head: Normocephalic and atraumatic.  Eyes:  Pupils are midpoint, not reactive, no corneal blink response, no tracking  Neck: No tracheal deviation present.  Left EJ in place  Cardiovascular: Regular rhythm and normal pulses.  Tachycardia present.   Pulmonary/Chest:  Patient has spontaneous respirations, approximately 12 minutes on arrival.  There is a nasal  trumpet in place. Breath sounds are coarse in the pharynx  Abdominal:  Large abdomen  Musculoskeletal:  No gross deformity  Neurological:  Patient's last arrival.  Does not respond to painful stimuli  Skin: Skin is warm.    ED Course  Procedures (including critical care time)  Labs Reviewed  COMPREHENSIVE METABOLIC PANEL - Abnormal; Notable for the following:    Sodium 133 (*)    Potassium 5.6 (*)    Chloride 93 (*)    CO2 16 (*)    Glucose, Bld 337 (*)    Albumin 3.2 (*)    AST 218 (*)    ALT 200 (*)    Alkaline Phosphatase 129 (*)    GFR calc non Af Amer 62 (*)    GFR calc Af Amer 72 (*)    All other components within normal limits  LACTIC ACID, PLASMA - Abnormal; Notable for the following:    Lactic Acid, Venous 13.0 (*)    All other components within normal limits  CBC - Abnormal; Notable for the following:    WBC 18.3 (*)    Hemoglobin 12.9 (*)     Platelets 134 (*)    All other components within normal limits  POCT I-STAT 3, BLOOD GAS (G3+) - Abnormal; Notable for the following:    pH, Arterial 7.211 (*)    pCO2 arterial 68.5 (*)    pO2, Arterial 103.0 (*)    Bicarbonate 27.5 (*)    All other components within normal limits  CULTURE, BLOOD (ROUTINE X 2)  CULTURE, BLOOD (ROUTINE X 2)  URINE CULTURE  CULTURE, RESPIRATORY (NON-EXPECTORATED)  BLOOD GAS, ARTERIAL  URINALYSIS, ROUTINE W REFLEX MICROSCOPIC  BLOOD GAS, ARTERIAL  LACTIC ACID, PLASMA  APTT  PROTIME-INR  CBC  TYPE AND SCREEN  ABO/RH   Dg Chest Portable 1 View  06/23/2012  *RADIOLOGY REPORT*  Clinical Data: Central line placement.  PORTABLE CHEST - 1 VIEW  Comparison: 06/23/2012.  Findings: The endotracheal tube is in satisfactory position.  Right IJ central line tip projects over the SVC.  A fairly radiopaque defibrillator pad overlies the lower left hemithorax.  The lungs are low in volume with probable mild persistent edema, right greater than left.  IMPRESSION: Persistent pulmonary edema.   Original Report Authenticated By: Leanna Battles, M.D.    Dg Chest Port 1 View  06/23/2012  *RADIOLOGY REPORT*  Clinical Data: Chest pain.  CPR.  The endotracheal tube placement.  PORTABLE CHEST - 1 VIEW  Comparison: Chest x-ray 04/28/2012.  Findings: An endotracheal tube is in place with tip 5.8 cm above the carina. Previously noted right internal jugular PermCath has been removed. Transcutaneous defibrillator pads seen projecting over the lower left hemithorax.  Lung volumes are low. There is cephalization of the pulmonary vasculature and slight indistinctness of the interstitial markings suggestive of mild pulmonary edema. More focal airspace opacity in the region of the right lower lobe.  Mild cardiomegaly. The patient is rotated to the left on today's exam, resulting in distortion of the mediastinal contours and reduced diagnostic sensitivity and specificity for mediastinal  pathology.  Atherosclerosis in the thoracic aorta.  IMPRESSION: 1.  Findings, as above, concerning for mild congestive heart failure. 2.  More focal opacity in the region of the right lower lobe may suggest sequelae of aspiration. 3.  Support apparatus, as above.   Original Report Authenticated By: Trudie Reed, M.D.      No diagnosis found.  On EMS arrival we discussed the patient's presentation,  and I reviewed his EKG from the ambulance.  This was 80 irregular, wide-complex tachycardia without discernible atrial activity  INTUBATION Performed by: Gerhard Munch  Required items: required blood products, implants, devices, and special equipment available Patient identity confirmed: provided demographic data and hospital-assigned identification number Time out: Immediately prior to procedure a "time out" was called to verify the correct patient, procedure, equipment, support staff and site/side marked as required.  Indications: airway protection  Intubation method: Glidescope Laryngoscopy   Preoxygenation: BVM - patient arrived hypoxic at 80's - 70's.  Prior to Intubation he was 89%  Sedatives: 20Etomidate Paralytic: 150Succinylcholine  Tube Size: 7.5 cuffed  Post-procedure assessment: chest rise and ETCO2 monitor Breath sounds: equal and absent over the epigastrium Tube secured with: ETT holder Chest x-ray interpreted by radiologist and me.  Chest x-ray findings: endotracheal tube in appropriate position  Patient tolerated the procedure well with no immediate complications.  After initiation of mechanical ventilation the patient had improved oxygenation with saturations in the mid-90s   Yesterday the patient, given concern for critical illness I spoke with their critical care team, as well as her cardiologist.  We reviewed the patient's ECG.  There is a wide differential, including PE, infection, aspiration, other mechanisms of cardiac arrest.  Update: Patient now fighting  the vent.   Date: 06/23/2012  Rate: 125  Rhythm: narrow complex tach  QRS Axis: normal  Intervals: indet  ST/T Wave abnormalities: nonspecific ST/T changes  Conduction Disutrbances:nonspecific intraventricular conduction delay  Narrative Interpretation:   Old EKG Reviewed: changes noted  More regular than EMS ECG  Update: Vital signs declining.  Fluids running, pressors ordered.  Update: Critical care is at the bedside, assuming care of the patient.   Date: 06/23/2012 #2  Rate: 122  Rhythm: sinus tachycardia  QRS Axis: normal  Intervals: normal  ST/T Wave abnormalities: nonspecific ST/T changes  Conduction Disutrbances:right bundle branch block  Narrative Interpretation:   Old EKG Reviewed: changes noted  Discernable p -waves - abnormal   MDM  This patient presents in extremis after initially being found weak, but soon thereafter collapsing and losing his pulses.  On my initial exam the patient had spontaneous respiration and cardiac activity, but is concern for area compromise and potential deterioration to, given the patient's recent CPR.  After initial intubation, chart review the patient demonstrates that he has transverse myelitis as well as hypertension, but no apparent cardiac history.  After initial valuation the patient was intubated, received IV fluid resuscitation.  Following resuscitation the patient improved, though on and off to critical care the patient's blood pressure was decreasing.  Given this concern pressors were started, and the patient was admitted to the critical care team.   CRITICAL CARE Performed by: Gerhard Munch   Total critical care time: 35  Critical care time was exclusive of separately billable procedures and treating other patients.  Critical care was necessary to treat or prevent imminent or life-threatening deterioration.  Critical care was time spent personally by me on the following activities: development of treatment plan with  patient and/or surrogate as well as nursing, discussions with consultants, evaluation of patient's response to treatment, examination of patient, obtaining history from patient or surrogate, ordering and performing treatments and interventions, ordering and review of laboratory studies, ordering and review of radiographic studies, pulse oximetry and re-evaluation of patient's condition.         Gerhard Munch, MD 06/23/12 [phone]

## 2012-06-23 NOTE — Procedures (Signed)
Central Venous Catheter Insertion Procedure Note Matthew Juarez [id number] 02/09/1961  Procedure: Insertion of Central Venous Catheter Indications: Assessment of intravascular volume, Drug and/or fluid administration and Frequent blood sampling  Procedure Details Consent: Risks of procedure as well as the alternatives and risks of each were explained to the (patient/caregiver).  Consent for procedure obtained. Time Out: Verified patient identification, verified procedure, site/side was marked, verified correct patient position, special equipment/implants available, medications/allergies/relevent history reviewed, required imaging and test results available.  Performed  Maximum sterile technique was used including antiseptics, cap, gloves, gown, hand hygiene, mask and sheet. Skin prep: Chlorhexidine; local anesthetic administered A antimicrobial bonded/coated triple lumen catheter was placed in the right internal jugular vein using the Seldinger technique. Ultrasound guidance used.yes Catheter placed to 17 cm. Blood aspirated via all 3 ports and then flushed x 3. Line sutured x 2 and dressing applied.  Evaluation Blood flow good Complications: No apparent complications Patient did tolerate procedure well. Chest X-ray ordered to verify placement.  CXR: pending.  Brett Canales Minor ACNP Adolph Pollack PCCM Pager [phone] till 3 pm If no answer page [phone]  Ultrasound used for site verification, live visualisation of needle entry & guidewire prior to dilation CXR shows CVL in good position & ready to use I was present for & supervised the entire procedure  Cyril Mourning MD 06/23/2012, 1:27 PM

## 2012-06-23 NOTE — Progress Notes (Signed)
Called Elink to report pt hypotension.

## 2012-06-24 ENCOUNTER — Inpatient Hospital Stay (HOSPITAL_COMMUNITY): Payer: BC Managed Care – PPO

## 2012-06-24 DIAGNOSIS — R579 Shock, unspecified: Secondary | ICD-10-CM

## 2012-06-24 LAB — BASIC METABOLIC PANEL
CO2: 15 mEq/L — ABNORMAL LOW (ref 19–32)
CO2: 16 mEq/L — ABNORMAL LOW (ref 19–32)
CO2: 19 mEq/L (ref 19–32)
Calcium: 7.3 mg/dL — ABNORMAL LOW (ref 8.4–10.5)
Calcium: 7.4 mg/dL — ABNORMAL LOW (ref 8.4–10.5)
Calcium: 7.7 mg/dL — ABNORMAL LOW (ref 8.4–10.5)
Chloride: 109 mEq/L (ref 96–112)
Creatinine, Ser: 1.97 mg/dL — ABNORMAL HIGH (ref 0.50–1.35)
GFR calc non Af Amer: 37 mL/min — ABNORMAL LOW (ref >90)
Glucose, Bld: 178 mg/dL — ABNORMAL HIGH (ref 70–99)
Glucose, Bld: 149 mg/dL — ABNORMAL HIGH (ref 70–99)
Potassium: 5.9 mEq/L — ABNORMAL HIGH (ref 3.5–5.1)
Potassium: 5.1 mEq/L (ref 3.5–5.1)
Sodium: 137 mEq/L (ref 135–145)
Sodium: 138 mEq/L (ref 135–145)

## 2012-06-24 LAB — GLUCOSE, CAPILLARY
Glucose-Capillary: 134 mg/dL — ABNORMAL HIGH (ref 70–99)
Glucose-Capillary: 145 mg/dL — ABNORMAL HIGH (ref 70–99)
Glucose-Capillary: 121 mg/dL — ABNORMAL HIGH (ref 70–99)

## 2012-06-24 LAB — BLOOD GAS, ARTERIAL
Acid-base deficit: 10.6 mmol/L — ABNORMAL HIGH (ref 0.0–2.0)
Bicarbonate: 14.8 mEq/L — ABNORMAL LOW (ref 20.0–24.0)
Drawn by: 312761
Drawn by: 31996
FIO2: 0.5 %
MECHVT: 700 mL
O2 Saturation: 98.6 %
Patient temperature: 98.6
RATE: 30 resp/min
RATE: 30 resp/min
pCO2 arterial: 26.7 mmHg — ABNORMAL LOW (ref 35.0–45.0)
pH, Arterial: 7.447 (ref 7.350–7.450)
pO2, Arterial: 150 mmHg — ABNORMAL HIGH (ref 80.0–100.0)

## 2012-06-24 LAB — HEPATIC FUNCTION PANEL
ALT: 204 U/L — ABNORMAL HIGH (ref 0–53)
Albumin: 2.8 g/dL — ABNORMAL LOW (ref 3.5–5.2)
Alkaline Phosphatase: 109 U/L (ref 39–117)
Total Bilirubin: 1.6 mg/dL — ABNORMAL HIGH (ref 0.3–1.2)
Total Protein: 5.4 g/dL — ABNORMAL LOW (ref 6.0–8.3)

## 2012-06-24 LAB — POCT I-STAT 3, ART BLOOD GAS (G3+)
Bicarbonate: 12 mEq/L — ABNORMAL LOW (ref 20.0–24.0)
TCO2: 13 mmol/L (ref 0–100)
pCO2 arterial: 30.3 mmHg — ABNORMAL LOW (ref 35.0–45.0)
pH, Arterial: 7.206 — ABNORMAL LOW (ref 7.350–7.450)
pO2, Arterial: 159 mmHg — ABNORMAL HIGH (ref 80.0–100.0)

## 2012-06-24 LAB — URINE CULTURE

## 2012-06-24 LAB — CBC
Hemoglobin: 13.2 g/dL (ref 13.0–17.0)
MCH: 28.6 pg (ref 26.0–34.0)
Platelets: 180 10*3/uL (ref 150–400)
RBC: 4.62 MIL/uL (ref 4.22–5.81)
WBC: 14.8 10*3/uL — ABNORMAL HIGH (ref 4.0–10.5)

## 2012-06-24 LAB — LACTIC ACID, PLASMA
Lactic Acid, Venous: 1.4 mmol/L (ref 0.5–2.2)
Lactic Acid, Venous: 1.2 mmol/L (ref 0.5–2.2)

## 2012-06-24 LAB — POCT ACTIVATED CLOTTING TIME: Activated Clotting Time: 149 seconds

## 2012-06-24 MED ORDER — SODIUM BICARBONATE 8.4 % IV SOLN
INTRAVENOUS | Status: DC
  Administered 2012-06-24 (×2): via INTRAVENOUS
  Filled 2012-06-24 (×6): qty 150

## 2012-06-24 MED ORDER — SODIUM CHLORIDE 0.9 % IV BOLUS (SEPSIS)
500.0000 mL | Freq: Once | INTRAVENOUS | Status: AC
  Administered 2012-06-24: 500 mL via INTRAVENOUS

## 2012-06-24 MED ORDER — VANCOMYCIN HCL 10 G IV SOLR
1500.0000 mg | Freq: Two times a day (BID) | INTRAVENOUS | Status: DC
  Administered 2012-06-25 – 2012-06-26 (×4): 1500 mg via INTRAVENOUS
  Filled 2012-06-24 (×6): qty 1500

## 2012-06-24 MED ORDER — SODIUM POLYSTYRENE SULFONATE 15 GM/60ML PO SUSP
30.0000 g | Freq: Once | ORAL | Status: AC
  Administered 2012-06-24: 30 g
  Filled 2012-06-24 (×2): qty 60

## 2012-06-24 MED ORDER — SODIUM CHLORIDE 0.9 % IV BOLUS (SEPSIS)
1000.0000 mL | Freq: Once | INTRAVENOUS | Status: AC
  Administered 2012-06-24: 1000 mL via INTRAVENOUS

## 2012-06-24 MED ORDER — SODIUM CHLORIDE 0.45 % IV SOLN
INTRAVENOUS | Status: DC
  Administered 2012-06-24: 75 mL/h via INTRAVENOUS

## 2012-06-24 MED ORDER — VANCOMYCIN HCL 10 G IV SOLR
2000.0000 mg | Freq: Once | INTRAVENOUS | Status: AC
  Administered 2012-06-24: 2000 mg via INTRAVENOUS
  Filled 2012-06-24: qty 2000

## 2012-06-24 NOTE — Progress Notes (Signed)
Called dr at [facility] about pts hypotension, new orders obtained

## 2012-06-24 NOTE — H&P (Signed)
PULMONARY  / CRITICAL CARE MEDICINE  Name: Matthew Juarez MRN: [id number] DOB: [birth year]    ADMISSION DATE:  06/23/2012 CONSULTATION DATE: 06/23/2012  REFERRING MD :  EDP  CHIEF COMPLAINT:  Cardiac arrest  BRIEF PATIENT DESCRIPTION: 52 y/o recently admitted with cervicothoracic transverse myelitis, R distal femur fracture and R DVT admitted after suffering PEA arrest  Secondary to suspected PE.  SIGNIFICANT EVENTS / STUDIES:  3/26  Admitted after PEA arrest, PE suspected 3/26  Venous Doppler >>> POSITIVE >>> Findings consistent with deep vein thrombosis involving the right popliteal vein, right posterial tibial vein, and right peroneal vein. 3/26  2D echo >>> POSITIVE >>> EF 70%, severe RV dilation / disfunction  LINES / TUBES: OETT 3/26 >>> OGT 3/26 >>> R IJ TLC 3/26  >>> R fem A line 3/26 >>>  CULTURES: 3/26 Blood >>> 3/26 Urine >>> 3/26 Respiratory >>>  ANTIBIOTICS:  SUBJECTIVE:  Decreased vasopressor requirements overnight  VITAL SIGNS: Temp:  [93.7 F (34.3 C)-100.2 F (37.9 C)] 99.9 F (37.7 C) (03/27 0800) Pulse Rate:  [62-134] 67 (03/27 0900) Resp:  [0-30] 30 (03/27 0755) BP: (47-135)/(15-81) 116/65 mmHg (03/27 0900) SpO2:  [90 %-100 %] 100 % (03/27 0900) Arterial Line BP: (67-141)/(41-71) 119/61 mmHg (03/27 0900) FiO2 (%):  [50 %-100 %] 50 % (03/27 0800) Weight:  [131.543 kg (290 lb)-140.1 kg (308 lb 13.8 oz)] 140.1 kg (308 lb 13.8 oz) (03/27 0415)  HEMODYNAMICS: CVP:  [6 mmHg-12 mmHg] 8 mmHg  VENTILATOR SETTINGS: Vent Mode:  [-] PRVC FiO2 (%):  [50 %-100 %] 50 % Set Rate:  [14 bmp-30 bmp] 30 bmp Vt Set:  [640 mL-700 mL] 700 mL PEEP:  [5 cmH20-10 cmH20] 5 cmH20 Plateau Pressure:  [18 cmH20-21 cmH20] 18 cmH20  INTAKE / OUTPUT: Intake/Output     03/26 0701 - 03/27 0700 03/27 0701 - 03/28 0700   I.V. (mL/kg) 9211.4 (65.7) 413.9 (3)   IV Piggyback 2300    Total Intake(mL/kg) 11511.4 (82.2) 413.9 (3)   Urine (mL/kg/hr) 970 110 (0.2)   Total Output 970  110   Net +10541.4 +303.9          PHYSICAL EXAMINATION: General:  Synchronous, no distress Neuro:  Sedated, non focal HEENT:  PERRL, OETT / OGT Cardiovascular:  RRR, no murmurs Lungs:  Bilateral diminished air entry Abdomen:  Obese, soft, bowel sounds present Musculoskeletal:  Trace edema Skin:  intact  LABS:  Recent Labs Lab 06/23/12 1154  06/23/12 1400 06/23/12 1415  06/23/12 1736 06/24/12 0144 06/24/12 0355 06/24/12 0515  HGB 12.9*  --   --  11.6*  --   --   --   --  13.2  WBC 18.3*  --   --  20.7*  --   --   --   --  14.8*  PLT 134*  --   --  108*  --   --   --   --  180  NA 133*  --   --   --   --   --   --   --  137  K 5.6*  --   --   --   --   --   --   --  5.9*  CL 93*  --   --   --   --   --   --   --  109  CO2 16*  --   --   --   --   --   --   --  15*  GLUCOSE 337*  --   --   --   --   --   --   --  178*  BUN 14  --   --   --   --   --   --   --  24*  CREATININE 1.28  --   --   --   --   --   --   --  2.13*  CALCIUM 9.1  --   --   --   --   --   --   --  7.3*  MG  --   --   --   --   --   --   --   --  1.6  PHOS  --   --   --   --   --   --   --   --  3.9  AST 218*  --   --   --   --   --   --   --  190*  ALT 200*  --   --   --   --   --   --   --  204*  ALKPHOS 129*  --   --   --   --   --   --   --  109  BILITOT 1.2  --   --   --   --   --   --   --  1.6*  PROT 6.2  --   --   --   --   --   --   --  5.4*  ALBUMIN 3.2*  --   --   --   --   --   --   --  2.8*  APTT  --   --   --  58*  --   --   --   --   --   INR  --   --   --  1.77*  --   --   --   --   --   LATICACIDVEN 13.0*  --  4.6*  --   --   --   --   --   --   PHART  --   < >  --   --   < > 7.255* 7.206* 7.285*  --   PCO2ART  --   < >  --   --   < > 41.1 30.3* 32.0*  --   PO2ART  --   < >  --   --   < > 190.0* 159.0* 150.0*  --   < > = values in this interval not displayed.  Recent Labs Lab 06/23/12 1537 06/23/12 1624 06/23/12 2058 06/24/12 0115 06/24/12 0420  GLUCAP 194* 158* 153* 135*  134*   CXR: 3/27 >>> Stable hardware, bilateral airspace disease / effusions  ASSESSMENT / PLAN:  PULMONARY A:  Acute respiratory failure.  Suspected aspiration pneumonia. P:   Full vent support, Trend ABG / CXR  CARDIOVASCULAR A: PEA cardiac arrest in setting of suspected PE. Shock, cardiogenic, decreasing vasopressor requirements. P:  Goal MAP > 65 Levophed gtt, titrate down Vasopressin gtt Anticoagulation as below CTA contraindicated - AKI Daily SBT  RENAL A:  AKI. Hyperkalemia.  P:   Given kayexalate D5 / bicarbonate gtt @ 100 Trend BMP  GASTROINTESTINAL A:  Congestive hepatopathy in setting of shock. P:   Trend LFTs Protonix for DVT Px Start TF  HEMATOLOGIC A:  Anemia, thrombocytopenia - resolved. P:  Trend CBC Heparin gtt per Pharmacy  INFECTIOUS A:  Suspected aspiration pneumonia. P:   Abx / Cx as above  ENDOCRINE A:  Hyperglycemia.  Presumed adrenal insufficiency. P:   SSI Stress dose steroids  NEUROLOGIC A:  Acute encephalopathy.  Hx of transverse myelitis. P:   Fentanyl / Versed gtt Goal RASS 0 to -1 Daily WUA  TODAY'S SUMMARY: 52 y/o recently admitted with cervicothoracic transverse myelitis, R distal femur fracture and R DVT admitted after suffering PEA arrest  Secondary to suspected PE. Venous Doppler suggestive of R DVT. 2D echo reports severe RV dysfunction / dilation. CTA deferred - AKI.  Imperical Heparin. Decreased vasopressor requirements since admission.  I have personally obtained a history, examined the patient, evaluated laboratory and imaging results, formulated the assessment and plan and placed orders.  CRITICAL CARE: The patient is critically ill with multiple organ systems failure and requires high complexity decision making for assessment and support, frequent evaluation and titration of therapies, application of advanced monitoring technologies and extensive interpretation of multiple databases. Critical Care Time devoted  to patient care services described in this note is 45 minutes.  Lonia Farber, MD Pulmonary and Critical Care Medicine [facility] Pager: [phone]  06/24/2012, 10:17 AM

## 2012-06-24 NOTE — Progress Notes (Signed)
ANTICOAGULATION CONSULT NOTE - Follow Up Consult  Pharmacy Consult for Heparin Indication: h/o DVT, presumed PE  No Known Allergies  Patient Measurements: Height: 6\' 3"  (190.5 cm) Weight: 308 lb 13.8 oz (140.1 kg) IBW/kg (Calculated) : 84.5 Heparin Dosing Weight: 115kg  Vital Signs: Temp: 99.9 F (37.7 C) (03/27 0800) Temp src: Core (Comment) (03/27 0800) BP: 133/73 mmHg (03/27 0800) Pulse Rate: 73 (03/27 0800)  Labs:  Recent Labs  06/23/12 1154 06/23/12 1415 06/23/12 2200 06/24/12 0515 06/24/12 0800  HGB 12.9* 11.6*  --  13.2  --   HCT 39.9 34.0*  --  39.0  --   PLT 134* 108*  --  180  --   APTT  --  58*  --   --   --   LABPROT  --  20.0*  --   --   --   INR  --  1.77*  --   --   --   HEPARINUNFRC  --   --  <0.10*  --  0.41  CREATININE 1.28  --   --  2.13*  --     Estimated Creatinine Clearance: 60.5 ml/min (by C-G formula based on Cr of 2.13).   Medications:  Scheduled:  . [COMPLETED] sodium chloride  250 mL Intravenous Once  . ampicillin-sulbactam (UNASYN) IV  3 g Intravenous Q6H  . antiseptic oral rinse  15 mL Mouth Rinse QID  . [COMPLETED] aspirin  324 mg Oral NOW   Or  . [COMPLETED] aspirin  300 mg Rectal NOW  . chlorhexidine  15 mL Mouth Rinse BID  . [EXPIRED] dextrose      . [COMPLETED] EPINEPHrine      . [COMPLETED] etomidate  20 mg Intravenous Once  . [EXPIRED] fentaNYL      . [EXPIRED] fentaNYL      . [COMPLETED] fentaNYL  100 mcg Intravenous Once  . hydrocortisone sod succinate (SOLU-CORTEF) injection  50 mg Intravenous Q6H  . insulin aspart  0-15 Units Subcutaneous Q4H  . [EXPIRED] norepinephrine      . [COMPLETED] norepinephrine (LEVOPHED) Adult infusion  2-50 mcg/min Intravenous STAT  . pantoprazole (PROTONIX) IV  40 mg Intravenous QHS  . [COMPLETED] propofol  5-70 mcg/kg/min Intravenous Once  . [COMPLETED] rocuronium  1 mg/kg Intravenous Once  . [COMPLETED] rocuronium  50 mg Intravenous Once  . [COMPLETED] sodium bicarbonate      .  [COMPLETED] sodium bicarbonate      . [COMPLETED] sodium chloride  1,000 mL Intravenous Once  . sodium chloride  1,000 mL Intravenous Once  . [COMPLETED] sodium chloride  1,000 mL Intravenous Once  . [COMPLETED] sodium chloride  1,000 mL Intravenous Once  . [COMPLETED] sodium polystyrene  30 g Per Tube Once  . [COMPLETED] succinylcholine  150 mg Intravenous Once  . [COMPLETED] tenecteplase  50 mg Intravenous Once  . [DISCONTINUED] alteplase  100 mg Intravenous STAT   Infusions:  . sodium chloride 75 mL/hr (06/24/12 0640)  . epinephrine    . fentaNYL infusion INTRAVENOUS 200 mcg/hr (06/24/12 0532)  . heparin 1,700 Units/hr (06/24/12 0200)  . midazolam (VERSED) infusion 4 mg/hr (06/23/12 2045)  . norepinephrine (LEVOPHED) Adult infusion 35 mcg/min (06/24/12 0903)  . vasopressin (PITRESSIN) infusion - *FOR SHOCK* 0.03 Units/min (06/23/12 2242)  . [DISCONTINUED] sodium chloride 150 mL/hr (06/24/12 0434)  . [DISCONTINUED] norepinephrine (LEVOPHED) Adult infusion 25 mcg/min (06/23/12 1422)    Assessment: 52 y/o male patient admitted with PEA arrest d/t presumed PE as evidenced by h/o DVT. Received  TNKase 50mg  on admit and subsequently started on heparin gtt. Xa level now therapeutic but on lower side of goal. Will increase gtt slightly. Noted scr increase, may accumulate drug.  Goal of Therapy:  Heparin level 0.3-0.7 units/ml Monitor platelets by anticoagulation protocol: Yes   Plan:  Increase heparin gtt to 1800 units/hr and check in 6 hours.  Verlene Mayer, PharmD, BCPS Pager [phone] 06/24/2012,9:36 AM

## 2012-06-24 NOTE — Progress Notes (Signed)
eLink Physician-Brief Progress Note Patient Name: Matthew Juarez DOB: May 10, 1960 MRN: [id number]  Date of Service  06/24/2012   HPI/Events of Note   K 5.9  eICU Interventions  Increase rate vent to correct ph, add kayxl;ate x 1, add bicarb, dc nacl , causing hyperchlroemic acidosis, repeat bmet 11 for day doc eval   Intervention Category Major Interventions: Electrolyte abnormality - evaluation and management  Nelda Bucks. 06/24/2012, 6:26 AM

## 2012-06-24 NOTE — Progress Notes (Signed)
ANTICOAGULATION CONSULT NOTE  Pharmacy Consult for Heparin Indication: pulmonary embolus  No Known Allergies  Patient Measurements: Height: 6\' 3"  (190.5 cm) Weight: 302 lb 7.5 oz (137.2 kg) IBW/kg (Calculated) : 84.5 Heparin Dosing Weight: 109.4 kg  Vital Signs: Temp: 99.7 F (37.6 C) (03/26 2100) Temp src: Core (Comment) (03/26 1800) BP: 66/44 mmHg (03/27 0100) Pulse Rate: 86 (03/27 0100)  Labs:  Recent Labs  06/23/12 1154 06/23/12 1415 06/23/12 2200  HGB 12.9* 11.6*  --   HCT 39.9 34.0*  --   PLT 134* 108*  --   APTT  --  58*  --   LABPROT  --  20.0*  --   INR  --  1.77*  --   HEPARINUNFRC  --   --  <0.10*  CREATININE 1.28  --   --     Estimated Creatinine Clearance: 99.7 ml/min (by C-G formula based on Cr of 1.28).  Assessment: 52 y/o male with R DVT and probable PE s/p TNKase for heparin.   Goal of Therapy:  Heparin level 0.3-0.5 units/ml for the first 24 hours, then 0.3-0.7 units/ml Monitor platelets by anticoagulation protocol: Yes   Plan:  Increase Heparin 1700 units/hr Check heparin level in 6 hours.  Geannie Risen, PharmD, BCPS 06/24/2012 1:54 AM

## 2012-06-24 NOTE — Progress Notes (Signed)
ANTICOAGULATION CONSULT NOTE - Follow Up Consult  Pharmacy Consult for Heparin Indication: h/o DVT, presumed PE  No Known Allergies  Patient Measurements: Height: 6\' 3"  (190.5 cm) Weight: 308 lb 13.8 oz (140.1 kg) IBW/kg (Calculated) : 84.5 Heparin Dosing Weight: 116 kg  Vital Signs: Temp: 99.1 F (37.3 C) (03/27 1200) Temp src: Core (Comment) (03/27 1200) BP: 93/65 mmHg (03/27 1800) Pulse Rate: 80 (03/27 1845)  Labs:  Recent Labs  06/23/12 1154 06/23/12 1415 06/23/12 2200 06/24/12 0515 06/24/12 0800 06/24/12 1100 06/24/12 1600 06/24/12 1721  HGB 12.9* 11.6*  --  13.2  --   --   --   --   HCT 39.9 34.0*  --  39.0  --   --   --   --   PLT 134* 108*  --  180  --   --   --   --   APTT  --  58*  --   --   --   --   --   --   LABPROT  --  20.0*  --   --   --   --   --   --   INR  --  1.77*  --   --   --   --   --   --   HEPARINUNFRC  --   --  <0.10*  --  0.41  --   --  0.36  CREATININE 1.28  --   --  2.13*  --  2.08* 1.97*  --     Estimated Creatinine Clearance: 65.4 ml/min (by C-G formula based on Cr of 1.97).   Medications:  Infusions:  . fentaNYL infusion INTRAVENOUS 200 mcg/hr (06/24/12 0532)  . heparin 1,800 Units/hr (06/24/12 1052)  . midazolam (VERSED) infusion 4 mg/hr (06/24/12 1315)  . norepinephrine (LEVOPHED) Adult infusion Stopped (06/24/12 1500)  .  sodium bicarbonate  infusion 1000 mL 100 mL/hr at 06/24/12 1048  . vasopressin (PITRESSIN) infusion - *FOR SHOCK* Stopped (06/24/12 1500)  . [DISCONTINUED] sodium chloride 75 mL/hr (06/24/12 0640)  . [DISCONTINUED] sodium chloride 150 mL/hr (06/24/12 0434)  . [DISCONTINUED] epinephrine      Assessment: 52 y/o male admitted with PEA arrest d/t presumed PE as evidenced by h/o DVT. Received TNKase 50mg  on admit and subsequently started on heparin gtt. Heparin level is therapeutic on the lower end of normal. SCr appears to be trending down now. No bleeding noted, CBC stable.  Goal of Therapy:  Heparin  level 0.3-0.7 units/ml Monitor platelets by anticoagulation protocol: Yes   Plan:  -Increase heparin drip to 1850 units/hr -Daily heparin level and CBC while on heparin -Monitor for signs/symptoms of bleeding  [facility], [address].D., BCPS Clinical Pharmacist Pager: [phone] 06/24/2012 6:51 PM

## 2012-06-24 NOTE — Plan of Care (Signed)
Problem: Phase I Progression Outcomes Goal: Hemodynamically stable Outcome: Not Met (add Reason) Requiring vasopressing, titration of norepinephrine, and intermittent fluid boluses to maintain MAP > 65.

## 2012-06-24 NOTE — Progress Notes (Signed)
ANTIBIOTIC CONSULT NOTE - INITIAL  Pharmacy Consult:  Vancomycin Indication:  GPC bacteremia  No Known Allergies  Patient Measurements: Height: 6\' 3"  (190.5 cm) Weight: 308 lb 13.8 oz (140.1 kg) IBW/kg (Calculated) : 84.5  Vital Signs: Temp: 99.1 F (37.3 C) (03/27 1200) Temp src: Core (Comment) (03/27 1200) BP: 94/61 mmHg (03/27 1950) Pulse Rate: 77 (03/27 1950) Intake/Output from previous day: 03/26 0701 - 03/27 0700 In: 11511.4 [I.V.:9211.4; IV Piggyback:2300] Out: 970 [Urine:970]  Labs:  Recent Labs  06/23/12 1154 06/23/12 1415 06/24/12 0515 06/24/12 1100 06/24/12 1600  WBC 18.3* 20.7* 14.8*  --   --   HGB 12.9* 11.6* 13.2  --   --   PLT 134* 108* 180  --   --   CREATININE 1.28  --  2.13* 2.08* 1.97*   Estimated Creatinine Clearance: 65.4 ml/min (by C-G formula based on Cr of 1.97). No results found for this basename: VANCOTROUGH, VANCOPEAK, VANCORANDOM, GENTTROUGH, GENTPEAK, GENTRANDOM, TOBRATROUGH, TOBRAPEAK, TOBRARND, AMIKACINPEAK, AMIKACINTROU, AMIKACIN,  in the last 72 hours   Microbiology: Recent Results (from the past 720 hour(s))  CULTURE, BLOOD (ROUTINE X 2)     Status: None   Collection Time    06/23/12  1:15 PM      Result Value Range Status   Specimen Description BLOOD A-LINE DRAW   Final   Special Requests BOTTLES DRAWN AEROBIC AND ANAEROBIC 10CC   Final   Culture  Setup Time 06/23/2012 22:57   Final   Culture     Final   Value: GRAM POSITIVE COCCI IN CLUSTERS     Note: Gram Stain Report Called to,Read Back By and Verified With: MAE ANINON ON 06/24/2012 AT 9:37P BY WILEJ   Report Status PENDING   Incomplete  CULTURE, BLOOD (ROUTINE X 2)     Status: None   Collection Time    06/23/12  2:15 PM      Result Value Range Status   Specimen Description BLOOD CENTRAL LINE   Final   Special Requests BOTTLES DRAWN AEROBIC AND ANAEROBIC 10CC   Final   Culture  Setup Time 06/23/2012 22:57   Final   Culture     Final   Value:        BLOOD CULTURE  RECEIVED NO GROWTH TO DATE CULTURE WILL BE HELD FOR 5 DAYS BEFORE ISSUING A FINAL NEGATIVE REPORT   Report Status PENDING   Incomplete  URINE CULTURE     Status: None   Collection Time    06/23/12  3:14 PM      Result Value Range Status   Specimen Description URINE, CATHETERIZED   Final   Special Requests NONE   Final   Culture  Setup Time 06/23/2012 16:15   Final   Colony Count NO GROWTH   Final   Culture NO GROWTH   Final   Report Status 06/24/2012 FINAL   Final  MRSA PCR SCREENING     Status: None   Collection Time    06/23/12  3:15 PM      Result Value Range Status   MRSA by PCR NEGATIVE  NEGATIVE Final   Comment:            The GeneXpert MRSA Assay (FDA     approved for NASAL specimens     only), is one component of a     comprehensive MRSA colonization     surveillance program. It is not     intended to diagnose MRSA  infection nor to guide or     monitor treatment for     MRSA infections.  CULTURE, RESPIRATORY (NON-EXPECTORATED)     Status: None   Collection Time    06/23/12  4:15 PM      Result Value Range Status   Specimen Description TRACHEAL ASPIRATE   Final   Special Requests NONE   Final   Gram Stain     Final   Value: FEW WBC PRESENT, PREDOMINANTLY PMN     RARE SQUAMOUS EPITHELIAL CELLS PRESENT     RARE GRAM POSITIVE COCCI     IN PAIRS   Culture Culture reincubated for better growth   Final   Report Status PENDING   Incomplete    Medical History: Past Medical History  Diagnosis Date  . Chronic back pain   . Hypertension   . Gout   . Headache   . Arthritis         Assessment: Matthew Juarez known to Pharmacy from heparin dosing.  He was previously started on Unasyn for r/o aspiration PNA.  Patient to start vancomycin for GPC bacteremia (1 of 2).  His renal function is improving.   Goal of Therapy:  Vancomycin trough level 15-20 mcg/ml   Plan:  - Vanc 2gm IV x 1, then 1500mg  IV Q12H - Continue Unasyn as ordered - Monitor renal fxn, C/S, vanc  trough at steady-state if indicated     Kennedi Lizardo D. Laney Potash, PharmD, BCPS Pager:  [phone] 06/24/2012, 9:47 PM

## 2012-06-24 NOTE — Progress Notes (Signed)
INITIAL NUTRITION ASSESSMENT  DOCUMENTATION CODES Per approved criteria  -Obesity Unspecified   INTERVENTION: 1. Once pt is medically stable, recommend initiation of EN. Osmolite 1.2 initiated at 20 ml/hr and advanced by 10 ml q 4 hr to a goal rate of 45 ml/hr. 60 ml Pro-stat QID. This EN regimen would provide 2096 kcal (69% estimated kcal needs), 180 gm protein (100% estimated needs), and 885 ml free water daily.   2. RD will continue to follow    NUTRITION DIAGNOSIS: Inadequate oral intake related to inability to eat as evidenced by NPO diet status.   Goal: Enteral nutrition to provide 60-70% of estimated calorie needs (22-25 kcals/kg ideal body weight) and 100% of estimated protein needs, based on ASPEN guidelines for permissive underfeeding in critically ill obese individuals.   Monitor:  Vent status, ability to initiate EN, weight trends, labs, I/O's  Reason for Assessment: VRDF  52 y.o. male  Admitting Dx: PEA arrest   ASSESSMENT: Pt was recently at [facility] for Cervicothoracic transverse myelitis (positive neuromyelitis optica antibody), R distal femur fx, R DVT.  D/c'd on 2/11.  Pt readmitted from home after near syncope then PEA arrest, concern for PE. On admission pt was intubated and required increasing doses of pressor support. Currently requiring vaso, norepi, and fluid boluses to maintain MAP>65.   Per notes from CIR stay pt was eating well. Weight is up 22 lbs from last admission. Once pt is more stable, recommend initiation of EN.   Height: Ht Readings from Last 1 Encounters:  06/23/12 6\' 3"  (1.905 m)    Weight: Wt Readings from Last 1 Encounters:  06/24/12 308 lb 13.8 oz (140.1 kg)    Ideal Body Weight: 196 lbs   % Ideal Body Weight: 157%  Wt Readings from Last 10 Encounters:  06/24/12 308 lb 13.8 oz (140.1 kg)  05/11/12 286 lb 2.5 oz (129.8 kg)  04/17/12 294 lb 8.6 oz (133.6 kg)  12/05/11 308 lb (139.708 kg)    Usual Body Weight: 308  % Usual Body  Weight: 100%  BMI:  Body mass index is 38.61 kg/(m^2). Obesity class 2  Patient is currently intubated on ventilator support.  MV: 21.1  Temp:Temp (24hrs), Avg:98 F (36.7 C), Min:95.7 F (35.4 C), Max:100.2 F (37.9 C)  Estimated Nutritional Needs: Kcal: 3000 Underfeeding goals: 1800-2100 Protein: >/= 178 gm  Fluid: 1.8 L   Skin: intack  Diet Order: NPO  EDUCATION NEEDS: -No education needs identified at this time   Intake/Output Summary (Last 24 hours) at 06/24/12 1155 Last data filed at 06/24/12 0900  Gross per 24 hour  Intake 11925.33 ml  Output   1080 ml  Net 10845.33 ml    Last BM: PTA    Labs:   Recent Labs Lab 06/23/12 1154 06/24/12 0515  NA 133* 137  K 5.6* 5.9*  CL 93* 109  CO2 16* 15*  BUN 14 24*  CREATININE 1.28 2.13*  CALCIUM 9.1 7.3*  MG  --  1.6  PHOS  --  3.9  GLUCOSE 337* 178*    CBG (last 3)   Recent Labs  06/24/12 0420 06/24/12 0821 06/24/12 1114  GLUCAP 134* 145* 147*    Scheduled Meds: . ampicillin-sulbactam (UNASYN) IV  3 g Intravenous Q6H  . antiseptic oral rinse  15 mL Mouth Rinse QID  . chlorhexidine  15 mL Mouth Rinse BID  . hydrocortisone sod succinate (SOLU-CORTEF) injection  50 mg Intravenous Q6H  . insulin aspart  0-15 Units Subcutaneous Q4H  .  pantoprazole (PROTONIX) IV  40 mg Intravenous QHS  . sodium chloride  1,000 mL Intravenous Once    Continuous Infusions: . epinephrine    . fentaNYL infusion INTRAVENOUS 200 mcg/hr (06/24/12 0532)  . heparin 1,800 Units/hr (06/24/12 1052)  . midazolam (VERSED) infusion 4 mg/hr (06/23/12 2045)  . norepinephrine (LEVOPHED) Adult infusion 20 mcg/min (06/24/12 1100)  .  sodium bicarbonate  infusion 1000 mL 100 mL/hr at 06/24/12 1048  . vasopressin (PITRESSIN) infusion - *FOR SHOCK* 0.03 Units/min (06/23/12 2242)    Past Medical History  Diagnosis Date  . Chronic back pain   . Hypertension   . Gout   . Headache   . Arthritis     History reviewed. No pertinent  past surgical history.  Clarene Duke RD, LDN Pager [phone] After Hours pager [phone]

## 2012-06-24 NOTE — Progress Notes (Signed)
CCM notified of hypotension/CVP 10.  NS 500 ml fluid bolus ordered.

## 2012-06-24 NOTE — Progress Notes (Signed)
eLink Physician-Brief Progress Note Patient Name: Matthew Juarez DOB: [birth year] MRN: [id number]  Date of Service  06/24/2012   HPI/Events of Note   Continued shock, cvp 12 in setting likely massive PE  eICU Interventions  Will be preload dep, bolus further, increase levo, abg   Intervention Category Major Interventions: Hypotension - evaluation and management  Eleesha Purkey J. 06/24/2012, 1:10 AM

## 2012-06-24 NOTE — Progress Notes (Signed)
eLink Physician-Brief Progress Note Patient Name: BRONC BROSSEAU DOB: 1960/08/27 MRN: [id number]  Date of Service  06/24/2012   HPI/Events of Note   RN reported blood Cx: GPC in clusters  eICU Interventions   Vancomycin per Pharmacy ordered   Intervention Category Intermediate Interventions: Infection - evaluation and management  Emalene Welte 06/24/2012, 9:40 PM

## 2012-06-25 ENCOUNTER — Inpatient Hospital Stay (HOSPITAL_COMMUNITY): Payer: BC Managed Care – PPO

## 2012-06-25 LAB — GLUCOSE, CAPILLARY
Glucose-Capillary: 126 mg/dL — ABNORMAL HIGH (ref 70–99)
Glucose-Capillary: 111 mg/dL — ABNORMAL HIGH (ref 70–99)

## 2012-06-25 LAB — CBC
MCH: 28.9 pg (ref 26.0–34.0)
MCV: 81.9 fL (ref 78.0–100.0)
Platelets: 120 10*3/uL — ABNORMAL LOW (ref 150–400)
RDW: 13.4 % (ref 11.5–15.5)
WBC: 8 10*3/uL (ref 4.0–10.5)

## 2012-06-25 LAB — BASIC METABOLIC PANEL
BUN: 22 mg/dL (ref 6–23)
CO2: 24 mEq/L (ref 19–32)
Calcium: 8.3 mg/dL — ABNORMAL LOW (ref 8.4–10.5)
Creatinine, Ser: 1.6 mg/dL — ABNORMAL HIGH (ref 0.50–1.35)
GFR calc Af Amer: 55 mL/min — ABNORMAL LOW (ref >90)

## 2012-06-25 LAB — HEPARIN LEVEL (UNFRACTIONATED)
Heparin Unfractionated: 0.26 IU/mL — ABNORMAL LOW (ref 0.30–0.70)
Heparin Unfractionated: 0.34 IU/mL (ref 0.30–0.70)

## 2012-06-25 MED ORDER — SODIUM CHLORIDE 0.9 % IV SOLN
INTRAVENOUS | Status: DC
  Administered 2012-06-25: 20 mL/h via INTRAVENOUS
  Administered 2012-07-01: 16:00:00 via INTRAVENOUS
  Administered 2012-07-02 – 2012-07-04 (×3): 500 mL via INTRAVENOUS

## 2012-06-25 MED ORDER — PROMOTE PO LIQD
1000.0000 mL | ORAL | Status: DC
  Filled 2012-06-25 (×2): qty 1000

## 2012-06-25 MED ORDER — OXYCODONE-ACETAMINOPHEN 5-325 MG PO TABS
1.0000 | ORAL_TABLET | Freq: Four times a day (QID) | ORAL | Status: DC | PRN
  Administered 2012-06-25: 2 via ORAL
  Administered 2012-06-26: 1 via ORAL
  Administered 2012-06-27: 2 via ORAL
  Administered 2012-06-27: 1 via ORAL
  Administered 2012-06-28 – 2012-07-06 (×11): 2 via ORAL
  Filled 2012-06-25: qty 2
  Filled 2012-06-25: qty 1
  Filled 2012-06-25 (×11): qty 2
  Filled 2012-06-25: qty 1
  Filled 2012-06-25: qty 2

## 2012-06-25 MED ORDER — PRO-STAT SUGAR FREE PO LIQD
60.0000 mL | Freq: Four times a day (QID) | ORAL | Status: DC
  Filled 2012-06-25 (×4): qty 60

## 2012-06-25 NOTE — Progress Notes (Signed)
ANTICOAGULATION CONSULT NOTE - Follow Up Consult  Pharmacy Consult for heparin Indication: h/o DVT with presumed PE  Labs:  Recent Labs  06/23/12 1415  06/24/12 0515  06/24/12 1100 06/24/12 1600 06/24/12 1721 06/25/12 0432 06/25/12 1100  HGB 11.6*  --  13.2  --   --   --   --  9.9*  --   HCT 34.0*  --  39.0  --   --   --   --  28.0*  --   PLT 108*  --  180  --   --   --   --  120*  --   APTT 58*  --   --   --   --   --   --   --   --   LABPROT 20.0*  --   --   --   --   --   --   --   --   INR 1.77*  --   --   --   --   --   --   --   --   HEPARINUNFRC  --   < >  --   < >  --   --  0.36 0.26* 0.71*  CREATININE  --   --  2.13*  --  2.08* 1.97*  --   --   --   < > = values in this interval not displayed.   Assessment: 52 y/o male patient receiving heparin gtt for presumed PE and h/o DVT. Xa level slightly above goal,will decrease rate and check 6 hour level to confirm.  Goal of Therapy:  Heparin level 0.3-0.7 units/ml   Plan:  Decrease rate to 1900 units/hr and f/u heparin level in 6 hours.  Verlene Mayer, PharmD, BCPS Pager [phone] 06/25/2012,11:44 AM

## 2012-06-25 NOTE — Progress Notes (Signed)
NUTRITION FOLLOW UP  Intervention:   1. Initiate Promote @ 20 ml/hr via OG and increase by 10 ml every 4 hours to goal rate of 40 ml/hr. 60 ml Prostat QID.  At goal rate, tube feeding regimen will provide 1760 kcal, 180 grams of protein, and 805 ml of H2O.   2. RD will continue to follow     Nutrition Dx:   Inadequate oral intake related to inability to eat as evidenced by NPO diet status. ongoing  Goal:   Enteral nutrition to provide 60-70% of estimated calorie needs (22-25 kcals/kg ideal body weight) and 100% of estimated protein needs, based on ASPEN guidelines for permissive underfeeding in critically ill obese individuals. Unmet   Monitor:   Vet status, TF rate/tolerance, weight trends, labs, I/O's  Assessment:   Pt continues on ventilator support. RD consulted for initiation and management of EN. Pt with OG tube in place. RN reports pt is awake and may extubate today. RD will place TF orders, RN to d/c if pt extubates.   Height: Ht Readings from Last 1 Encounters:  06/23/12 6\' 3"  (1.905 m)    Weight Status:   Wt Readings from Last 1 Encounters:  06/24/12 308 lb 13.8 oz (140.1 kg)   Patient is currently intubated on ventilator support.  MV: 8.2 Temp:Temp (24hrs), Avg:98.7 F (37.1 C), Min:98.2 F (36.8 C), Max:99.1 F (37.3 C)   Re-estimated needs:  Kcal: 2508 Underfeeding goal: 1610-9604 Protein: >/= 178 gm  Fluid: 1.5 L   Skin: intact   Diet Order: NPO   Intake/Output Summary (Last 24 hours) at 06/25/12 1036 Last data filed at 06/25/12 1000  Gross per 24 hour  Intake 5423.01 ml  Output   1725 ml  Net 3698.01 ml  + 14 L this admission  Last BM: PTA    Labs:   Recent Labs Lab 06/24/12 0515 06/24/12 1100 06/24/12 1600  NA 137 138 137  K 5.9* 5.1 4.5  CL 109 109 109  CO2 15* 16* 19  BUN 24* 25* 25*  CREATININE 2.13* 2.08* 1.97*  CALCIUM 7.3* 7.4* 7.7*  MG 1.6  --   --   PHOS 3.9  --   --   GLUCOSE 178* 149* 123*    CBG (last 3)    Recent Labs  06/24/12 2330 06/25/12 0424 06/25/12 0754  GLUCAP 110* 126* 111*    Scheduled Meds: . ampicillin-sulbactam (UNASYN) IV  3 g Intravenous Q6H  . antiseptic oral rinse  15 mL Mouth Rinse QID  . chlorhexidine  15 mL Mouth Rinse BID  . hydrocortisone sod succinate (SOLU-CORTEF) injection  50 mg Intravenous Q6H  . insulin aspart  0-15 Units Subcutaneous Q4H  . pantoprazole (PROTONIX) IV  40 mg Intravenous QHS  . sodium chloride  1,000 mL Intravenous Once  . vancomycin  1,500 mg Intravenous Q12H    Continuous Infusions: . fentaNYL infusion INTRAVENOUS 100 mcg/hr (06/25/12 0100)  . heparin 2,000 Units/hr (06/25/12 0510)  . midazolam (VERSED) infusion 1 mg/hr (06/25/12 0100)  . norepinephrine (LEVOPHED) Adult infusion Stopped (06/25/12 0250)  .  sodium bicarbonate  infusion 1000 mL 100 mL/hr at 06/24/12 2145  . vasopressin (PITRESSIN) infusion - *FOR SHOCK* Stopped (06/24/12 1500)    Clarene Duke RD, LDN Pager [phone] After Hours pager [phone]

## 2012-06-25 NOTE — Progress Notes (Signed)
PT Cancellation Note  Patient Details Name: Matthew Juarez MRN: [id number] DOB: Apr 14, 1960   Cancelled Treatment:    Reason Eval/Treat Not Completed: Medical issues which prohibited therapy; pt still with femoral line and unable to get OOB.  Will see tomorrow after d/c femoral line.   Antone Summons,CYNDI 06/25/2012, 2:41 PM

## 2012-06-25 NOTE — Progress Notes (Signed)
PULMONARY  / CRITICAL CARE MEDICINE  Name: Matthew Juarez MRN: [id number] DOB: 06-Apr-1960    ADMISSION DATE:  06/23/2012 CONSULTATION DATE: 06/23/2012  REFERRING MD :  EDP  CHIEF COMPLAINT:  Cardiac arrest  BRIEF PATIENT DESCRIPTION: 52 y/o recently admitted with cervicothoracic transverse myelitis, R distal femur fracture and R DVT admitted after suffering PEA arrest  Secondary to suspected PE.  SIGNIFICANT EVENTS / STUDIES:  3/26  Admitted after PEA arrest, PE suspected 3/26  Venous Doppler >>> POSITIVE >>> Findings consistent with deep vein thrombosis involving the right popliteal vein, right posterial tibial vein, and right peroneal vein. 3/26  2D echo >>> POSITIVE >>> EF 70%, severe RV dilation / disfunction  LINES / TUBES: OETT 3/26 >>> OGT 3/26 >>> R IJ TLC 3/26  >>> R fem A line 3/26 >>>  CULTURES: 3/26 Blood >>> GPC clusters >>  3/26 Urine >>> negative 3/26 Respiratory >>> staph aureus >>  3/26 MRSA screen >> negative  ANTIBIOTICS: unasyn 3/26 >>  vanco 3/27 >>   SUBJECTIVE:    VITAL SIGNS: Temp:  [98.2 F (36.8 C)-99.1 F (37.3 C)] 98.2 F (36.8 C) (03/28 0743) Pulse Rate:  [0-110] 99 (03/28 1000) Resp:  [12-30] 12 (03/28 0743) BP: (90-154)/(52-86) 149/84 mmHg (03/28 0743) SpO2:  [99 %-100 %] 100 % (03/28 1000) Arterial Line BP: (85-185)/(48-102) 164/88 mmHg (03/28 1000) FiO2 (%):  [40 %-50 %] 40 % (03/28 0743)  HEMODYNAMICS: CVP:  [7 mmHg-12 mmHg] 7 mmHg  VENTILATOR SETTINGS: Vent Mode:  [-] PSV;CPAP FiO2 (%):  [40 %-50 %] 40 % Set Rate:  [30 bmp] 30 bmp Vt Set:  [700 mL] 700 mL PEEP:  [5 cmH20] 5 cmH20 Pressure Support:  [5 cmH20] 5 cmH20 Plateau Pressure:  [19 cmH20-21 cmH20] 21 cmH20  INTAKE / OUTPUT: Intake/Output     03/27 0701 - 03/28 0700 03/28 0701 - 03/29 0700   I.V. (mL/kg) 3614.7 (25.8) 415 (3)   IV Piggyback 1700 200   Total Intake(mL/kg) 5314.7 (37.9) 615 (4.4)   Urine (mL/kg/hr) 1485 (0.4) 350 (0.7)   Total Output 1485 350   Net +3829.7 +265          PHYSICAL EXAMINATION: General:  Synchronous, no distress Neuro:  Sedated, non focal HEENT:  PERRL, OETT / OGT Cardiovascular:  RRR, no murmurs Lungs:  Bilateral diminished air entry Abdomen:  Obese, soft, bowel sounds present Musculoskeletal:  Trace edema Skin:  intact  LABS:  Recent Labs Lab 06/23/12 1154  06/23/12 1400 06/23/12 1415  06/24/12 0144 06/24/12 0355 06/24/12 0515 06/24/12 1100 06/24/12 1600 06/24/12 1714 06/24/12 1748 06/25/12 0432  HGB 12.9*  --   --  11.6*  --   --   --  13.2  --   --   --   --  9.9*  WBC 18.3*  --   --  20.7*  --   --   --  14.8*  --   --   --   --  8.0  PLT 134*  --   --  108*  --   --   --  180  --   --   --   --  120*  NA 133*  --   --   --   --   --   --  137 138 137  --   --   --   K 5.6*  --   --   --   --   --   --  5.9* 5.1 4.5  --   --   --   CL 93*  --   --   --   --   --   --  109 109 109  --   --   --   CO2 16*  --   --   --   --   --   --  15* 16* 19  --   --   --   GLUCOSE 337*  --   --   --   --   --   --  178* 149* 123*  --   --   --   BUN 14  --   --   --   --   --   --  24* 25* 25*  --   --   --   CREATININE 1.28  --   --   --   --   --   --  2.13* 2.08* 1.97*  --   --   --   CALCIUM 9.1  --   --   --   --   --   --  7.3* 7.4* 7.7*  --   --   --   MG  --   --   --   --   --   --   --  1.6  --   --   --   --   --   PHOS  --   --   --   --   --   --   --  3.9  --   --   --   --   --   AST 218*  --   --   --   --   --   --  190*  --   --   --   --   --   ALT 200*  --   --   --   --   --   --  204*  --   --   --   --   --   ALKPHOS 129*  --   --   --   --   --   --  109  --   --   --   --   --   BILITOT 1.2  --   --   --   --   --   --  1.6*  --   --   --   --   --   PROT 6.2  --   --   --   --   --   --  5.4*  --   --   --   --   --   ALBUMIN 3.2*  --   --   --   --   --   --  2.8*  --   --   --   --   --   APTT  --   --   --  58*  --   --   --   --   --   --   --   --   --   INR  --   --   --   1.77*  --   --   --   --   --   --   --   --   --   LATICACIDVEN 13.0*  --  4.6*  --   --   --   --   --  1.4  --  1.2  --   --   PHART  --   < >  --   --   < > 7.206* 7.285*  --   --   --   --  7.447  --   PCO2ART  --   < >  --   --   < > 30.3* 32.0*  --   --   --   --  26.7*  --   PO2ART  --   < >  --   --   < > 159.0* 150.0*  --   --   --   --  125.0*  --   < > = values in this interval not displayed.  Recent Labs Lab 06/24/12 1658 06/24/12 2006 06/24/12 2330 06/25/12 0424 06/25/12 0754  GLUCAP 121* 108* 110* 126* 111*   CXR: 3/28 >>> Stable hardware, bilateral airspace disease / effusions  ASSESSMENT / PLAN:  PULMONARY A:  Acute respiratory failure.  Possible aspiration pneumonia. P:   Full vent support >> goal extubation 3/28 Follow CXR  CARDIOVASCULAR A: PEA cardiac arrest in setting of suspected PE. Shock, cardiogenic, decreasing vasopressor requirements. P:  Pressors off Anticoagulation as below CTA contraindicated at this time - AKI; consider V/Q at some point, although I believe he is committed to anticoagulation for 6-12 mo anyway  RENAL A:  AKI. Hyperkalemia.  P:   Given kayexalate 3/26 D5 / bicarbonate gtt @ 100 >> stop 3/28 after assess ing BMP  GASTROINTESTINAL A:  Congestive hepatopathy in setting of shock. P:   Trend LFTs Protonix for DVT Px  HEMATOLOGIC A:  Anemia, thrombocytopenia - resolved. P:  Trend CBC Heparin gtt per Pharmacy  INFECTIOUS A:  Suspected aspiration pneumonia. P:   Abx / Cx as above  ENDOCRINE A:  Hyperglycemia.  Presumed adrenal insufficiency. P:   SSI Stress dose steroids >> d/c on 3/28  NEUROLOGIC A:  Acute encephalopathy.  Hx of transverse myelitis. P:   Fentanyl / Versed gtt Goal RASS 0 to -1 Daily WUA  TODAY'S SUMMARY: 52 y/o recently admitted with cervicothoracic transverse myelitis, R distal femur fracture and R DVT admitted after suffering PEA arrest  Secondary to suspected PE. Venous Doppler suggestive  of R DVT. 2D echo reports severe RV dysfunction / dilation. CTA deferred - AKI.  Emperical Heparin.  I have personally obtained a history, examined the patient, evaluated laboratory and imaging results, formulated the assessment and plan and placed orders.  CRITICAL CARE: The patient is critically ill with multiple organ systems failure and requires high complexity decision making for assessment and support, frequent evaluation and titration of therapies, application of advanced monitoring technologies and extensive interpretation of multiple databases. Critical Care Time devoted to patient care services described in this note is 45 minutes.  Levy Pupa, MD, PhD 06/25/2012, 11:02 AM [facility] [phone] or if no answer [phone]

## 2012-06-25 NOTE — Progress Notes (Signed)
ANTICOAGULATION CONSULT NOTE - Follow Up Consult  Pharmacy Consult for heparin Indication: h/o DVT with presumed PE  Labs:  Recent Labs  06/23/12 1415  06/24/12 0515  06/24/12 1100 06/24/12 1600  06/25/12 0432 06/25/12 1100 06/25/12 1900  HGB 11.6*  --  13.2  --   --   --   --  9.9*  --   --   HCT 34.0*  --  39.0  --   --   --   --  28.0*  --   --   PLT 108*  --  180  --   --   --   --  120*  --   --   APTT 58*  --   --   --   --   --   --   --   --   --   LABPROT 20.0*  --   --   --   --   --   --   --   --   --   INR 1.77*  --   --   --   --   --   --   --   --   --   HEPARINUNFRC  --   < >  --   < >  --   --   < > 0.26* 0.71* 0.34  CREATININE  --   --  2.13*  --  2.08* 1.97*  --   --  1.60*  --   < > = values in this interval not displayed.   Assessment: 52 y/o male patient receiving heparin gtt for presumed PE and h/o DVT.  The heparin level is now at goal (HL= 0.34) after a decrease to 1900 units/hr  Goal of Therapy:  Heparin level 0.3-0.7 units/ml   Plan:   -No heparin changes needed -Heparin level and CBC in am  Harland German, Pharm D 06/25/2012 8:27 PM

## 2012-06-25 NOTE — Procedures (Signed)
Extubation Procedure Note  Patient Details:   Name: Matthew Juarez DOB: 02-25-1961 MRN: [id number]   Airway Documentation:     Evaluation  O2 sats: stable throughout Complications: No apparent complications Patient did tolerate procedure well. Bilateral Breath Sounds: Diminished Suctioning: Airway Yes   Patient extubated and placed on 3LNC. Patient had bilateral breathsounds and no stridor was present. HR-99 99% 151/84. RT will continue to monitor Adolm Joseph 06/25/2012, 11:20 AM

## 2012-06-25 NOTE — Progress Notes (Signed)
ANTICOAGULATION CONSULT NOTE - Follow Up Consult  Pharmacy Consult for heparin Indication: h/o DVT with presumed PE  Labs:  Recent Labs  06/23/12 1415  06/24/12 0515 06/24/12 0800 06/24/12 1100 06/24/12 1600 06/24/12 1721 06/25/12 0432  HGB 11.6*  --  13.2  --   --   --   --  9.9*  HCT 34.0*  --  39.0  --   --   --   --  28.0*  PLT 108*  --  180  --   --   --   --  120*  APTT 58*  --   --   --   --   --   --   --   LABPROT 20.0*  --   --   --   --   --   --   --   INR 1.77*  --   --   --   --   --   --   --   HEPARINUNFRC  --   < >  --  0.41  --   --  0.36 0.26*  CREATININE  --   --  2.13*  --  2.08* 1.97*  --   --   < > = values in this interval not displayed.   Assessment: 51yo male now subtherapeutic on heparin after two levels at goal; noted that Hgb has dropped to 9.9 (was 11.6 2d ago), per RN no overt signs of bleeding, monitoring.  Goal of Therapy:  Heparin level 0.3-0.7 units/ml   Plan:  Will increase heparin gtt by 1 unit/kg/hr to 2000 units/hr and check level in 6hr.  [name], PharmD, BCPS  06/25/2012,5:11 AM

## 2012-06-26 DIAGNOSIS — M7989 Other specified soft tissue disorders: Secondary | ICD-10-CM

## 2012-06-26 DIAGNOSIS — G8220 Paraplegia, unspecified: Secondary | ICD-10-CM

## 2012-06-26 LAB — CULTURE, RESPIRATORY W GRAM STAIN

## 2012-06-26 LAB — COMPREHENSIVE METABOLIC PANEL
BUN: 19 mg/dL (ref 6–23)
Calcium: 8 mg/dL — ABNORMAL LOW (ref 8.4–10.5)
GFR calc Af Amer: 62 mL/min — ABNORMAL LOW (ref >90)
Glucose, Bld: 95 mg/dL (ref 70–99)
Total Protein: 4.5 g/dL — ABNORMAL LOW (ref 6.0–8.3)

## 2012-06-26 LAB — CBC
HCT: 19.4 % — ABNORMAL LOW (ref 39.0–52.0)
HCT: 21.3 % — ABNORMAL LOW (ref 39.0–52.0)
Hemoglobin: 6.6 g/dL — CL (ref 13.0–17.0)
Hemoglobin: 7.3 g/dL — ABNORMAL LOW (ref 13.0–17.0)
RDW: 13.8 % (ref 11.5–15.5)
WBC: 5.6 10*3/uL (ref 4.0–10.5)
WBC: 6.6 10*3/uL (ref 4.0–10.5)

## 2012-06-26 LAB — CULTURE, BLOOD (ROUTINE X 2)

## 2012-06-26 LAB — PREPARE RBC (CROSSMATCH)

## 2012-06-26 MED ORDER — SODIUM CHLORIDE 0.9 % IV SOLN
INTRAVENOUS | Status: DC
  Administered 2012-06-26: 16:00:00 via INTRAVENOUS

## 2012-06-26 MED ORDER — ACETAMINOPHEN 325 MG PO TABS
650.0000 mg | ORAL_TABLET | ORAL | Status: DC | PRN
  Administered 2012-06-27: 650 mg via ORAL
  Filled 2012-06-26: qty 2

## 2012-06-26 MED ORDER — POTASSIUM CHLORIDE 10 MEQ/50ML IV SOLN
10.0000 meq | INTRAVENOUS | Status: AC
  Administered 2012-06-26 (×4): 10 meq via INTRAVENOUS
  Filled 2012-06-26: qty 200

## 2012-06-26 NOTE — Progress Notes (Signed)
eLink Physician-Brief Progress Note Patient Name: JANDEL PATRIARCA DOB: 12/22/1960 MRN: [id number]  Date of Service  06/26/2012   HPI/Events of Note  Patient on heparin for DVT and s/p PEA arrest felt secondary to PE.  Now with Hgb drop from 9.9 to 6.6.  Remains HD stable.   eICU Interventions  Plan: Transfuse 1 unit of pRBC Monitor post-transfusion CBC Consider d/c of heparin but may need filter given history   Intervention Category Intermediate Interventions: Bleeding - evaluation and treatment with blood products  Kesha Hurrell 06/26/2012, 5:01 AM

## 2012-06-26 NOTE — Plan of Care (Signed)
Problem: Phase II Progression Outcomes Goal: Time pt extubated/weaned off vent Outcome: Completed/Met Date Met:  06/26/12 Extubated at 06/25/12 at 11 am

## 2012-06-26 NOTE — Progress Notes (Signed)
ANTICOAGULATION CONSULT NOTE - Follow Up Consult  Pharmacy Consult for heparin Indication: h/o DVT with presumed PE  Labs:  Recent Labs  06/23/12 1415  06/24/12 0515  06/24/12 1100 06/24/12 1600  06/25/12 0432 06/25/12 1100 06/25/12 1900 06/26/12 0420  HGB 11.6*  --  13.2  --   --   --   --  9.9*  --   --  6.6*  HCT 34.0*  --  39.0  --   --   --   --  28.0*  --   --  19.4*  PLT 108*  --  180  --   --   --   --  120*  --   --  PENDING  APTT 58*  --   --   --   --   --   --   --   --   --   --   LABPROT 20.0*  --   --   --   --   --   --   --   --   --   --   INR 1.77*  --   --   --   --   --   --   --   --   --   --   HEPARINUNFRC  --   < >  --   < >  --   --   < > 0.26* 0.71* 0.34 0.22*  CREATININE  --   --  2.13*  --  2.08* 1.97*  --   --  1.60*  --   --   < > = values in this interval not displayed.   Assessment/Plan:  52yo male now subtherapeutic on heparin after one level at low end of goal; Hgb down to 6.6 with red "Kool-Aid" urine, about to transfuse.  Per RN there have been no interruptions to gtt.  Will continue gtt without change for now and recheck level and f/u with rounding team.  [name], PharmD, BCPS  06/26/2012,5:26 AM

## 2012-06-26 NOTE — Progress Notes (Signed)
PULMONARY  / CRITICAL CARE MEDICINE  Name: BANDON SHERWIN MRN: [id number] DOB: 1961/03/31    ADMISSION DATE:  06/23/2012 CONSULTATION DATE: 06/23/2012  REFERRING MD :  EDP  CHIEF COMPLAINT:  Cardiac arrest  BRIEF PATIENT DESCRIPTION: 52 y/o recently admitted with cervicothoracic transverse myelitis, R distal femur fracture and R DVT admitted after suffering PEA arrest  Secondary to suspected PE.  SIGNIFICANT EVENTS / STUDIES:  3/26  Admitted after PEA arrest, PE suspected 3/26  Venous Doppler >>> POSITIVE >>> Findings consistent with deep vein thrombosis involving the right popliteal vein, right posterial tibial vein, and right peroneal vein. 3/26  2D echo >>> POSITIVE >>> EF 70%, severe RV dilation / disfunction  LINES / TUBES: OETT 3/26 >>>3-29 OGT 3/26 >>>3-28 R IJ TLC 3/26  >>> R fem A line 3/26 >>>3-28  CULTURES: 3/26 Blood 1/2 >>> GPC clusters >>  3/26 Urine >>> negative 3/26 Respiratory >>> staph aureus >>  3/26 MRSA screen >> negative  ANTIBIOTICS: unasyn 3/26 >>  vanco 3/27 >>   SUBJECTIVE:    VITAL SIGNS: Temp:  [97.5 F (36.4 C)-99.7 F (37.6 C)] 99.7 F (37.6 C) (03/29 0700) Pulse Rate:  [78-109] 80 (03/29 0700) Resp:  [15-24] 23 (03/29 0700) BP: (108-142)/(56-66) 142/60 mmHg (03/29 0700) SpO2:  [93 %-100 %] 98 % (03/29 0600) Arterial Line BP: (139-164)/(69-88) 146/70 mmHg (03/28 2000)  HEMODYNAMICS:    VENTILATOR SETTINGS:    INTAKE / OUTPUT: Intake/Output     03/28 0701 - 03/29 0700 03/29 0701 - 03/30 0700   I.V. (mL/kg) 1336 (9.5)    Blood 15    IV Piggyback 1450    Total Intake(mL/kg) 2801 (20)    Urine (mL/kg/hr) 2950 (0.9) 250 (1.1)   Total Output 2950 250   Net -149 -250          PHYSICAL EXAMINATION: General:  Looks great Neuro:  Awake and alert, preexisting le weakness  HEENT:  PERRL,  Cardiovascular:  RRR, no murmurs Lungs:  Bilateral diminished air entry Abdomen:  Obese, soft, bowel sounds present Musculoskeletal:  Trace  edema, lower ext muscle wasting Skin:  intact  LABS:  Recent Labs Lab 06/23/12 1154  06/23/12 1400 06/23/12 1415  06/24/12 0144 06/24/12 0355 06/24/12 0515 06/24/12 1100 06/24/12 1600 06/24/12 1714 06/24/12 1748 06/25/12 0432 06/25/12 1100 06/26/12 0420  HGB 12.9*  --   --  11.6*  --   --   --  13.2  --   --   --   --  9.9*  --  6.6*  WBC 18.3*  --   --  20.7*  --   --   --  14.8*  --   --   --   --  8.0  --  5.6  PLT 134*  --   --  108*  --   --   --  180  --   --   --   --  120*  --  96*  NA 133*  --   --   --   --   --   --  137 138 137  --   --   --  141 143  K 5.6*  --   --   --   --   --   --  5.9* 5.1 4.5  --   --   --  3.7 3.1*  CL 93*  --   --   --   --   --   --  109 109 109  --   --   --  107 109  CO2 16*  --   --   --   --   --   --  15* 16* 19  --   --   --  24 27  GLUCOSE 337*  --   --   --   --   --   --  178* 149* 123*  --   --   --  122* 95  BUN 14  --   --   --   --   --   --  24* 25* 25*  --   --   --  22 19  CREATININE 1.28  --   --   --   --   --   --  2.13* 2.08* 1.97*  --   --   --  1.60* 1.46*  CALCIUM 9.1  --   --   --   --   --   --  7.3* 7.4* 7.7*  --   --   --  8.3* 8.0*  MG  --   --   --   --   --   --   --  1.6  --   --   --   --   --   --   --   PHOS  --   --   --   --   --   --   --  3.9  --   --   --   --   --   --   --   AST 218*  --   --   --   --   --   --  190*  --   --   --   --   --   --  50*  ALT 200*  --   --   --   --   --   --  204*  --   --   --   --   --   --  84*  ALKPHOS 129*  --   --   --   --   --   --  109  --   --   --   --   --   --  70  BILITOT 1.2  --   --   --   --   --   --  1.6*  --   --   --   --   --   --  1.3*  PROT 6.2  --   --   --   --   --   --  5.4*  --   --   --   --   --   --  4.5*  ALBUMIN 3.2*  --   --   --   --   --   --  2.8*  --   --   --   --   --   --  2.3*  APTT  --   --   --  58*  --   --   --   --   --   --   --   --   --   --   --   INR  --   --   --  1.77*  --   --   --   --   --   --   --   --   --    --   --  LATICACIDVEN 13.0*  --  4.6*  --   --   --   --   --  1.4  --  1.2  --   --   --   --   PHART  --   < >  --   --   < > 7.206* 7.285*  --   --   --   --  7.447  --   --   --   PCO2ART  --   < >  --   --   < > 30.3* 32.0*  --   --   --   --  26.7*  --   --   --   PO2ART  --   < >  --   --   < > 159.0* 150.0*  --   --   --   --  125.0*  --   --   --   < > = values in this interval not displayed.  Recent Labs Lab 06/24/12 2330 06/25/12 0424 06/25/12 0754 06/25/12 1312 06/25/12 1631  GLUCAP 110* 126* 111* 105* 107*   .imaging: Dg Chest Port 1 View  06/25/2012  *RADIOLOGY REPORT*  Clinical Data: Fluid overload.  PORTABLE CHEST - 1 VIEW  Comparison: June 24, 2012.  Findings: Endotracheal tube is in grossly good position with tip about 6 cm above the carina.  Right internal jugular catheter line is unchanged in position.  No pneumothorax or pleural effusion is noted minimal opacity is noted in both lung bases suggesting subsegmental atelectasis  which is slightly improved.  IMPRESSION: Slightly improved bilateral basilar subsegmental atelectasis compared to prior exam. No other significant abnormality seen.   Original Report Authenticated By: Lupita Raider.,  M.D.      ASSESSMENT / PLAN:  PULMONARY A:  Acute respiratory failure.  Possible aspiration pneumonia. P:   -3-28 extubated and  CARDIOVASCULAR A: PEA cardiac arrest in setting of suspected PE. Shock, cardiogenic, decreasing vasopressor requirements. P:  Pressors off Anticoagulation as below CTA contraindicated at this time - AKI; consider V/Q at some point, although I believe he is committed to anticoagulation for 6-12 mo anyway 3-29 hold heparin x 24 hours due to hematuria and 2 gm drop in hgb requiring transfusion 3-29. Restart 3-30 RENAL Lab Results  Component Value Date   CREATININE 1.46* 06/26/2012   CREATININE 1.60* 06/25/2012   CREATININE 1.97* 06/24/2012    A:  AKI. Hyperkalemia.  P:   Given kayexalate  3/26 D5 / bicarbonate gtt @ 100 >> stopped 3/28  GASTROINTESTINAL A:  Congestive hepatopathy in setting of shock. P:   Trend LFTs Protonix for DVT Px  HEMATOLOGIC  Recent Labs  06/25/12 0432 06/26/12 0420  HGB 9.9* 6.6*    A:  Anemia, thrombocytopenia -2 gm drop hemoglobin with hematuria P:  Trend CBC Heparin gtt per Pharmacy/3-29 hold drip x 24 hours Transfuse 2 units prbc 3/29  INFECTIOUS A:  Suspected aspiration pneumonia. P:   Abx / Cx as above  ENDOCRINE CBG (last 3)   Recent Labs  06/25/12 0754 06/25/12 1312 06/25/12 1631  GLUCAP 111* 105* 107*     A:  Hyperglycemia.  Presumed adrenal insufficiency. P:   SSI Stress dose steroids >> d/c on 3/28  NEUROLOGIC A:  Acute encephalopathy.  Hx of transverse myelitis. Resolved 3-29 Wide awake. Residual lower ext weakness from preexisting transverse myelitis.  P:   -monitor  TODAY'S SUMMARY: 52 y/o recently admitted with cervicothoracic transverse myelitis, R  distal femur fracture and R DVT admitted after suffering PEA arrest  Secondary to suspected PE. Venous Doppler suggestive of R DVT. 2D echo reports severe RV dysfunction / dilation. CTA deferred - AKI.  Emperical Heparin. 3-29 looks great but 2 gm drop in hgb on hep + hematuria. Stop hep x 24 hours 3-29   [facility] ACNP [facility] P[facility] Pager [phone] till 3 pm If no answer page [phone] 06/26/2012, 8:58 AM  Will hold in the ICU overnight given bleeding and likely move out in AM to SDU.  Patient seen and examined, agree with above note.  I dictated the care and orders written for this patient under my direction.  Alyson Reedy, MD [phone]

## 2012-06-26 NOTE — Progress Notes (Signed)
eLink Physician-Brief Progress Note Patient Name: Matthew Juarez DOB: March 14, 1961 MRN: [id number]  Date of Service  06/26/2012   HPI/Events of Note  Hypokalemia  eICU Interventions  Potassium replaced   Intervention Category Minor Interventions: Electrolytes abnormality - evaluation and management  DETERDING,ELIZABETH 06/26/2012, 5:40 AM

## 2012-06-26 NOTE — Progress Notes (Signed)
Dr. Molli Knock notified of pt now coughing and expectorating thick bloody sputum.  Will continue to monitor pt closely.

## 2012-06-26 NOTE — Progress Notes (Signed)
Right:  No evidence of DVT or superficial thrombosis.

## 2012-06-26 NOTE — Progress Notes (Signed)
Dr. deterding notified of pt. Hgb. Waiting for orders for transfusion.Matthew Juarez

## 2012-06-26 NOTE — Progress Notes (Signed)
BC 1/2 pos for staph coag negative (staph coag negative bacteremia); currently on unasyn and vanc; suspect contaminant;   Repeat BC today; no changes in antibiotics.

## 2012-06-26 NOTE — Evaluation (Signed)
Physical Therapy Evaluation Patient Details Name: Matthew Juarez MRN: [id number] DOB: 01/07/1961 Today's Date: 06/26/2012 Time: 4098-1191 PT Time Calculation (min): 24 min  PT Assessment / Plan / Recommendation Clinical Impression  Patient is a 52 yo male admitted with near syncopal event and subsequent cardiac arrest (possibly due to large PE) with CPR x2 and intubated.  Patient extubated 06/25/12.  Patient had recently been discharged from Inpatient Rehab for transverse myelitis, Rt. fibular fx and Rt. LE DVT.  Was at mod I level with transfers at home pta with HHPT.  Patient now presents with weakness RUE/RLE, decreased sensation left, deconditioning, all impacting functional mobility - requiring +2 assist currently.  Will benefit from acute PT to maximize independence prior to discharge.  Recommend Inpatient Rehab consult for comprehensive therapies to maximize functional independence prior to discharge home with wife.    PT Assessment  Patient needs continued PT services    Follow Up Recommendations  CIR    Does the patient have the potential to tolerate intense rehabilitation      Barriers to Discharge        Equipment Recommendations  None recommended by PT    Recommendations for Other Services Rehab consult   Frequency Min 3X/week    Precautions / Restrictions Precautions Precautions: Fall Required Braces or Orthoses: Other Brace/Splint Other Brace/Splint: Rt. AFO (at home - wife to bring in) Restrictions Weight Bearing Restrictions: No Other Position/Activity Restrictions: No orders for any weight bearing restrictions   Pertinent Vitals/Pain       Mobility  Bed Mobility Bed Mobility: Rolling Right;Right Sidelying to Sit;Sitting - Scoot to [place] of Bed;Sit to Supine Rolling Right: 1: +2 Total assist;With rail Rolling Right: Patient Percentage: 30% Right Sidelying to Sit: 1: +2 Total assist;With rails Right Sidelying to Sit: Patient Percentage: 30% Sitting - Scoot  to Edge of Bed: 1: +2 Total assist;With rail Sitting - Scoot to [place] of Bed: Patient Percentage: 30% Sit to Supine: 1: +2 Total assist;HOB flat Sit to Supine: Patient Percentage: 20% Details for Bed Mobility Assistance: Verbal cues for sequencing and technique.  Patient required +2 assist for all bed mobility.  Patient sat at EOB x 8 minutes, working on upright posture and balance with min assist and bil UE support. Transfers Transfers: Not assessed    Exercises     PT Diagnosis: Difficulty walking;Generalized weakness;Hemiplegia dominant side  PT Problem List: Decreased strength;Decreased activity tolerance;Decreased balance;Decreased mobility;Decreased coordination;Decreased knowledge of use of DME;Cardiopulmonary status limiting activity;Impaired sensation PT Treatment Interventions: DME instruction;Functional mobility training;Therapeutic activities;Balance training;Patient/family education;Wheelchair mobility training   PT Goals Acute Rehab PT Goals PT Goal Formulation: With patient/family Time For Goal Achievement: 07/10/12 Potential to Achieve Goals: Good Pt will Roll Supine to Right Side: with min assist;with rail PT Goal: Rolling Supine to Right Side - Progress: Goal set today Pt will Roll Supine to Left Side: with min assist;with rail PT Goal: Rolling Supine to Left Side - Progress: Goal set today Pt will go Supine/Side to Sit: with min assist;with HOB 0 degrees PT Goal: Supine/Side to Sit - Progress: Goal set today Pt will Sit at Edge of Bed: with supervision;6-10 min;with unilateral upper extremity support PT Goal: Sit at [place] Of Bed - Progress: Goal set today Pt will go Sit to Supine/Side: with mod assist;with HOB 0 degrees PT Goal: Sit to Supine/Side - Progress: Goal set today Pt will Transfer Bed to Chair/Chair to Bed: with min assist PT Transfer Goal: Bed to Chair/Chair to Bed - Progress:  Goal set today  Visit Information  Last PT Received On: 06/26/12 Assistance  Needed: +2    Subjective Data  Subjective: "I was able to get in my wheelchair on my own, and was starting to work on taking steps with the [facility]) therapist." Patient Stated Goal: To be able to return home.   Prior Functioning  Home Living Lives With: Spouse Available Help at Discharge: Family;Available 24 hours/day Type of Home: House Home Access: Ramped entrance Home Layout: One level Bathroom Shower/Tub: Engineer, manufacturing systems: Standard Home Adaptive Equipment: Bedside commode/3-in-1;Tub transfer bench;Walker - standard;Wheelchair - manual Prior Function Level of Independence: Needs assistance Needs Assistance: Gait Gait Assistance: Working with HHPT on taking steps with std walker and Rt. AFO. Able to Take Stairs?: No Driving: No Communication Communication: No difficulties    Cognition  Cognition Overall Cognitive Status: Appears within functional limits for tasks assessed/performed Arousal/Alertness: Awake/alert Orientation Level: Appears intact for tasks assessed Behavior During Session: [facility] for tasks performed    Extremity/Trunk Assessment Right Upper Extremity Assessment RUE ROM/Strength/Tone: Deficits RUE ROM/Strength/Tone Deficits: Strength grossly 3-/5; Edema noted throughout RUE Left Upper Extremity Assessment LUE ROM/Strength/Tone: WFL for tasks assessed Right Lower Extremity Assessment RLE ROM/Strength/Tone: Deficits RLE ROM/Strength/Tone Deficits: Strength grossly 2/5 RLE Sensation: WFL - Light Touch Left Lower Extremity Assessment LLE ROM/Strength/Tone: WFL for tasks assessed LLE Sensation: Deficits LLE Sensation Deficits: Decreased to light touch   Balance Balance Balance Assessed: Yes Static Sitting Balance Static Sitting - Balance Support: Bilateral upper extremity supported;Feet supported Static Sitting - Level of Assistance: 4: Min assist Static Sitting - Comment/# of Minutes: Patient sat EOB x 8 minutes.  Posterior lean.    End  of Session PT - End of Session Equipment Utilized During Treatment: Oxygen Activity Tolerance: Patient limited by fatigue Patient left: in bed;with call bell/phone within reach;with family/visitor present Nurse Communication: Mobility status;Need for lift equipment  GP     Vena Austria 06/26/2012, 1:20 PM Durenda Hurt. Renaldo Fiddler, [facility] Acute Rehab Services Pager [phone]

## 2012-06-27 ENCOUNTER — Inpatient Hospital Stay (HOSPITAL_COMMUNITY): Payer: BC Managed Care – PPO

## 2012-06-27 LAB — CBC
HCT: 22 % — ABNORMAL LOW (ref 39.0–52.0)
Hemoglobin: 6.8 g/dL — CL (ref 13.0–17.0)
Hemoglobin: 7.8 g/dL — ABNORMAL LOW (ref 13.0–17.0)
MCH: 29.1 pg (ref 26.0–34.0)
MCV: 85 fL (ref 78.0–100.0)
MCV: 82.4 fL (ref 78.0–100.0)
RBC: 2.34 MIL/uL — ABNORMAL LOW (ref 4.22–5.81)
RBC: 2.67 MIL/uL — ABNORMAL LOW (ref 4.22–5.81)
RDW: 13.7 % (ref 11.5–15.5)
WBC: 7.7 10*3/uL (ref 4.0–10.5)

## 2012-06-27 LAB — BASIC METABOLIC PANEL
CO2: 27 mEq/L (ref 19–32)
Calcium: 8.1 mg/dL — ABNORMAL LOW (ref 8.4–10.5)
Chloride: 107 mEq/L (ref 96–112)
Glucose, Bld: 102 mg/dL — ABNORMAL HIGH (ref 70–99)
Sodium: 141 mEq/L (ref 135–145)

## 2012-06-27 LAB — TYPE AND SCREEN
ABO/RH(D): B POS
Antibody Screen: NEGATIVE
Unit division: 0

## 2012-06-27 LAB — PREPARE RBC (CROSSMATCH)

## 2012-06-27 LAB — PROTIME-INR
INR: 1.13 (ref 0.00–1.49)
Prothrombin Time: 14.3 seconds (ref 11.6–15.2)

## 2012-06-27 MED ORDER — POTASSIUM CHLORIDE 10 MEQ/50ML IV SOLN
10.0000 meq | INTRAVENOUS | Status: AC
  Administered 2012-06-27 (×6): 10 meq via INTRAVENOUS
  Filled 2012-06-27: qty 300

## 2012-06-27 MED ORDER — POLYETHYLENE GLYCOL 3350 17 G PO PACK
17.0000 g | PACK | Freq: Every day | ORAL | Status: DC
  Administered 2012-06-27 – 2012-07-05 (×6): 17 g via ORAL
  Filled 2012-06-27 (×9): qty 1

## 2012-06-27 MED ORDER — BISACODYL 10 MG RE SUPP: 10.0000 mg | Freq: Every day | RECTAL | Status: DC | PRN

## 2012-06-27 NOTE — Progress Notes (Signed)
ANTIBIOTIC CONSULT NOTE - INITIAL  Pharmacy Consult:  Unasyn Indication:  Aspiration Pneumonia  No Known Allergies  Patient Measurements: Height: 6\' 3"  (190.5 cm) Weight: 322 lb 5 oz (146.2 kg) IBW/kg (Calculated) : 84.5  Vital Signs: Temp: 99.5 F (37.5 C) (03/30 1300) Temp src: Core (Comment) (03/30 0715) BP: 162/71 mmHg (03/30 1300) Pulse Rate: 85 (03/30 1300) Intake/Output from previous day: 03/29 0701 - 03/30 0700 In: 2530.8 [P.O.:481; I.V.:449.8; IV Piggyback:1600] Out: 4720 [Urine:4720]  Labs:  Recent Labs  06/25/12 1100 06/26/12 0420 06/26/12 0900 06/27/12 0453 06/27/12 1110  WBC  --  5.6 6.6 6.5 7.7  HGB  --  6.6* 7.3* 6.8* 7.8*  PLT  --  96* 103* 108* 122*  CREATININE 1.60* 1.46*  --  1.34  --    Estimated Creatinine Clearance: 98.5 ml/min (by C-G formula based on Cr of 1.34). No results found for this basename: VANCOTROUGH, VANCOPEAK, VANCORANDOM, GENTTROUGH, GENTPEAK, GENTRANDOM, TOBRATROUGH, TOBRAPEAK, TOBRARND, AMIKACINPEAK, AMIKACINTROU, AMIKACIN,  in the last 72 hours   Microbiology: Recent Results (from the past 720 hour(s))  CULTURE, BLOOD (ROUTINE X 2)     Status: None   Collection Time    06/23/12  1:15 PM      Result Value Range Status   Specimen Description BLOOD A-LINE DRAW   Final   Special Requests BOTTLES DRAWN AEROBIC AND ANAEROBIC 10CC   Final   Culture  Setup Time 06/23/2012 22:57   Final   Culture     Final   Value: STAPHYLOCOCCUS SPECIES (COAGULASE NEGATIVE)     Note: THE SIGNIFICANCE OF ISOLATING THIS ORGANISM FROM A SINGLE SET OF BLOOD CULTURES WHEN MULTIPLE SETS ARE DRAWN IS UNCERTAIN. PLEASE NOTIFY THE MICROBIOLOGY DEPARTMENT WITHIN ONE WEEK IF SPECIATION AND SENSITIVITIES ARE REQUIRED.     Note: Gram Stain Report Called to,Read Back By and Verified With: MAE ANINON ON 06/24/2012 AT 9:37P BY WILEJ   Report Status 06/26/2012 FINAL   Final  CULTURE, BLOOD (ROUTINE X 2)     Status: None   Collection Time    06/23/12  2:15 PM       Result Value Range Status   Specimen Description BLOOD CENTRAL LINE   Final   Special Requests BOTTLES DRAWN AEROBIC AND ANAEROBIC 10CC   Final   Culture  Setup Time 06/23/2012 22:57   Final   Culture     Final   Value:        BLOOD CULTURE RECEIVED NO GROWTH TO DATE CULTURE WILL BE HELD FOR 5 DAYS BEFORE ISSUING A FINAL NEGATIVE REPORT   Report Status PENDING   Incomplete  URINE CULTURE     Status: None   Collection Time    06/23/12  3:14 PM      Result Value Range Status   Specimen Description URINE, CATHETERIZED   Final   Special Requests NONE   Final   Culture  Setup Time 06/23/2012 16:15   Final   Colony Count NO GROWTH   Final   Culture NO GROWTH   Final   Report Status 06/24/2012 FINAL   Final  MRSA PCR SCREENING     Status: None   Collection Time    06/23/12  3:15 PM      Result Value Range Status   MRSA by PCR NEGATIVE  NEGATIVE Final   Comment:            The GeneXpert MRSA Assay (FDA     approved for NASAL specimens  only), is one component of a     comprehensive MRSA colonization     surveillance program. It is not     intended to diagnose MRSA     infection nor to guide or     monitor treatment for     MRSA infections.  CULTURE, RESPIRATORY (NON-EXPECTORATED)     Status: None   Collection Time    06/23/12  4:15 PM      Result Value Range Status   Specimen Description TRACHEAL ASPIRATE   Final   Special Requests NONE   Final   Gram Stain     Final   Value: FEW WBC PRESENT, PREDOMINANTLY PMN     RARE SQUAMOUS EPITHELIAL CELLS PRESENT     RARE GRAM POSITIVE COCCI     IN PAIRS   Culture     Final   Value: MODERATE STAPHYLOCOCCUS AUREUS     Note: RIFAMPIN AND GENTAMICIN SHOULD NOT BE USED AS SINGLE DRUGS FOR TREATMENT OF STAPH INFECTIONS.   Report Status 06/26/2012 FINAL   Final   Organism ID, Bacteria STAPHYLOCOCCUS AUREUS   Final    Medical History: Past Medical History  Diagnosis Date  . Chronic back pain   . Hypertension   . Gout   . Headache    . Arthritis     Assessment: 43 YOM who presented in cardiac arrest recieving Unasyn for r/o aspiration PNA. Was receiving vancomycin empirally for GPC in blood that has now speciated out at coag negative staph presumed to be contaminate. Resp culture is MSSA which Unasyn should cover that and any anaerobes from aspiration. Could consider changing to more staph specific coverage like cefazolin based on Cx result. Renal function improving.  Plan:  - Continue Unasyn 3g q6h - Monitor renal fxn

## 2012-06-27 NOTE — Progress Notes (Signed)
eLink Physician-Brief Progress Note Patient Name: Matthew Juarez DOB: January 13, 1961 MRN: [id number]  Date of Service  06/27/2012   HPI/Events of Note  Hgb down  from 7.3 to 6.6.  Had been on heparin yesterday.  This was d/ced due to hgb drop and hematuria.   eICU Interventions  Plan: Transfuse 1 unit pRBC Post-transfusion CBC   Intervention Category Intermediate Interventions: Bleeding - evaluation and treatment with blood products  DETERDING,ELIZABETH 06/27/2012, 5:26 AM

## 2012-06-27 NOTE — Progress Notes (Signed)
CRITICAL VALUE ALERT  Critical value received:  Hgb 6.8  Date of notification:  06-27-12  Time of notification:  0520  Critical value read back:yes  Nurse who received alert:  Madalyn Rob   MD notified (1st page): Dr. Darrick Penna  Time of first page:  0521  MD notified (2nd page):  Time of second page:  Responding MD:  Dr. Darrick Penna  Time MD responded:  [phone]

## 2012-06-27 NOTE — Progress Notes (Signed)
PULMONARY  / CRITICAL CARE MEDICINE  Name: Matthew Juarez MRN: [id number] DOB: 03-Mar-1961    ADMISSION DATE:  06/23/2012 CONSULTATION DATE: 06/23/2012  REFERRING MD :  EDP  CHIEF COMPLAINT:  Cardiac arrest  BRIEF PATIENT DESCRIPTION: 52 y/o recently admitted with cervicothoracic transverse myelitis, R distal femur fracture and R DVT admitted after suffering PEA arrest  Secondary to suspected PE.  SIGNIFICANT EVENTS / STUDIES:  3/26  Admitted after PEA arrest, PE suspected 3/26  Venous Doppler >>> POSITIVE >>> Findings consistent with deep vein thrombosis involving the right popliteal vein, right posterial tibial vein, and right peroneal vein. 3/26  2D echo >>> POSITIVE >>> EF 70%, severe RV dilation / disfunction  LINES / TUBES: OETT 3/26 >>>3-29 OGT 3/26 >>>3-28 R IJ TLC 3/26  >>> R fem A line 3/26 >>>3-28  CULTURES: 3/26 Blood 1/2 >>> GPC clusters >>  3/26 Urine >>> negative 3/26 Respiratory >>> staph aureus >> pan ss 3/26 MRSA screen >> negative  ANTIBIOTICS: unasyn 3/26 >>  vanco 3/27 >> 3-30  SUBJECTIVE:    VITAL SIGNS: Temp:  [98.6 F (37 C)-101.1 F (38.4 C)] 98.6 F (37 C) (03/30 0715) Pulse Rate:  [63-100] 63 (03/30 0715) Resp:  [17-30] 18 (03/30 0715) BP: (122-168)/(61-85) 122/75 mmHg (03/30 0715) SpO2:  [98 %-100 %] 100 % (03/30 0700) Weight:  [146.2 kg (322 lb 5 oz)] 146.2 kg (322 lb 5 oz) (03/30 0600)  HEMODYNAMICS:    VENTILATOR SETTINGS:    INTAKE / OUTPUT: Intake/Output     03/29 0701 - 03/30 0700 03/30 0701 - 03/31 0700   P.O. 481    I.V. (mL/kg) 449.8 (3.1)    Blood  12.5   IV Piggyback 1600    Total Intake(mL/kg) 2530.8 (17.3) 12.5 (0.1)   Urine (mL/kg/hr) 4720 (1.3)    Total Output 4720     Net -2189.2 +12.5          PHYSICAL EXAMINATION: General:  Looks great Neuro:  Awake and alert, preexisting le weakness  HEENT:  PERRL,  Cardiovascular:  RRR, no murmurs Lungs:  Bilateral diminished air entry Abdomen:  Obese, soft, bowel  sounds present Foley: hematuria resolved Musculoskeletal:  Trace edema, lower ext muscle wasting Skin:  intact  LABS:  Recent Labs Lab 06/23/12 1154  06/23/12 1400 06/23/12 1415  06/24/12 0144 06/24/12 0355 06/24/12 0515 06/24/12 1100  06/24/12 1714 06/24/12 1748  06/25/12 1100 06/26/12 0420 06/26/12 0900 06/27/12 0453  HGB 12.9*  --   --  11.6*  --   --   --  13.2  --   --   --   --   < >  --  6.6* 7.3* 6.8*  WBC 18.3*  --   --  20.7*  --   --   --  14.8*  --   --   --   --   < >  --  5.6 6.6 6.5  PLT 134*  --   --  108*  --   --   --  180  --   --   --   --   < >  --  96* 103* 108*  NA 133*  --   --   --   --   --   --  137 138  < >  --   --   --  141 143  --  141  K 5.6*  --   --   --   --   --   --  5.9* 5.1  < >  --   --   --  3.7 3.1*  --  3.0*  CL 93*  --   --   --   --   --   --  109 109  < >  --   --   --  107 109  --  107  CO2 16*  --   --   --   --   --   --  15* 16*  < >  --   --   --  24 27  --  27  GLUCOSE 337*  --   --   --   --   --   --  178* 149*  < >  --   --   --  122* 95  --  102*  BUN 14  --   --   --   --   --   --  24* 25*  < >  --   --   --  22 19  --  14  CREATININE 1.28  --   --   --   --   --   --  2.13* 2.08*  < >  --   --   --  1.60* 1.46*  --  1.34  CALCIUM 9.1  --   --   --   --   --   --  7.3* 7.4*  < >  --   --   --  8.3* 8.0*  --  8.1*  MG  --   --   --   --   --   --   --  1.6  --   --   --   --   --   --   --   --   --   PHOS  --   --   --   --   --   --   --  3.9  --   --   --   --   --   --   --   --   --   AST 218*  --   --   --   --   --   --  190*  --   --   --   --   --   --  50*  --   --   ALT 200*  --   --   --   --   --   --  204*  --   --   --   --   --   --  84*  --   --   ALKPHOS 129*  --   --   --   --   --   --  109  --   --   --   --   --   --  70  --   --   BILITOT 1.2  --   --   --   --   --   --  1.6*  --   --   --   --   --   --  1.3*  --   --   PROT 6.2  --   --   --   --   --   --  5.4*  --   --   --   --   --   --  4.5*   --   --  ALBUMIN 3.2*  --   --   --   --   --   --  2.8*  --   --   --   --   --   --  2.3*  --   --   APTT  --   --   --  58*  --   --   --   --   --   --   --   --   --   --   --   --  23*  INR  --   --   --  1.77*  --   --   --   --   --   --   --   --   --   --   --   --  1.13  LATICACIDVEN 13.0*  --  4.6*  --   --   --   --   --  1.4  --  1.2  --   --   --   --   --   --   PHART  --   < >  --   --   < > 7.206* 7.285*  --   --   --   --  7.447  --   --   --   --   --   PCO2ART  --   < >  --   --   < > 30.3* 32.0*  --   --   --   --  26.7*  --   --   --   --   --   PO2ART  --   < >  --   --   < > 159.0* 150.0*  --   --   --   --  125.0*  --   --   --   --   --   < > = values in this interval not displayed.  Recent Labs Lab 06/24/12 2330 06/25/12 0424 06/25/12 0754 06/25/12 1312 06/25/12 1631  GLUCAP 110* 126* 111* 105* 107*   .imaging: Dg Chest Port 1 View  06/27/2012  *RADIOLOGY REPORT*  Clinical Data: Fever, atelectasis, extubated  PORTABLE CHEST - 1 VIEW  Comparison: 06/25/2012  Findings: Interval removal of the endotracheal tube and NG tube. Right IJ central line tip remains in the mid SVC level.  Moderate posterior layering right effusion noted with increased right lung perihilar and lower lobe atelectasis / consolidation.  Minimal left base atelectasis persist.  Trachea is midline.  IMPRESSION: New moderate posterior layering right effusion with right lung atelectasis / consolidation.   Original Report Authenticated By: Judie Petit. Shick, M.D.      ASSESSMENT / PLAN:  PULMONARY A:  Acute respiratory failure.  Possible aspiration pneumonia. P:   -3-28 extubated and off O2  CARDIOVASCULAR A: PEA cardiac arrest in setting of suspected PE. Shock, cardiogenic, decreasing vasopressor requirements. P:  Pressors off Anticoagulation as below CTA contraindicated at this time - AKI; consider V/Q at some point, although I believe he is committed to anticoagulation for 6-12 mo anyway 3-29  hold heparin x 24 hours due to hematuria and 2 gm drop in hgb requiring transfusion 3-29. Continue to hold 3-30 and restart 3-31 RENAL Lab Results  Component Value Date   CREATININE 1.34 06/27/2012   CREATININE 1.46* 06/26/2012   CREATININE 1.60* 06/25/2012    A:  AKI. Hyperkalemia.  P:   Given kayexalate 3/26 D5 /  bicarbonate gtt @ 100 >> stopped 3/28  GASTROINTESTINAL liver function tests  A:  Congestive hepatopathy in setting of shock. P:   Trend LFTs Protonix for DVT Px  HEMATOLOGIC  Recent Labs  06/26/12 0900 06/27/12 0453  HGB 7.3* 6.8*    A:  Anemia, thrombocytopenia -2 gm drop hemoglobin with hematuria P:  Trend CBC Heparin gtt per Pharmacy/3-29 hold drip  Till 3-31 Transfuse 2 units prbc 3/29 Transfuse 1 unit PRBC 3-30(hematuria resolved )  INFECTIOUS A:  Suspected aspiration pneumonia. P:   Abx / Cx as above  ENDOCRINE CBG (last 3)   Recent Labs  06/25/12 0754 06/25/12 1312 06/25/12 1631  GLUCAP 111* 105* 107*     A:  Hyperglycemia.  Presumed adrenal insufficiency. P:   SSI Stress dose steroids >> d/c on 3/28  NEUROLOGIC A:  Acute encephalopathy.  Hx of transverse myelitis. Resolved 3-29 Wide awake. Residual lower ext weakness from preexisting transverse myelitis.  P:   -monitor  TODAY'S SUMMARY: 52 y/o recently admitted with cervicothoracic transverse myelitis, R distal femur fracture and R DVT admitted after suffering PEA arrest  Secondary to suspected PE. Venous Doppler suggestive of R DVT. 2D echo reports severe RV dysfunction / dilation. CTA deferred - AKI.  Emperical Heparin. 3-29 looks great but 2 gm drop in hgb on hep + hematuria. Stop hep x 24 hours 3-29 3-30 1 unit  prbc hep remains off. Hematuria resolved. 3-30 tx to SDU.  Brett Canales Minor ACNP [facility] PCCM Pager [phone] till 3 pm If no answer page [phone] 06/27/2012, 8:17 AM  Transfer to SDU.  Will keep on PCCM's service.  If hematuria and hemoptysis are improved by AM then  would restart heparin drip and coumadin after that.  Patient seen and examined, agree with above note.  I dictated the care and orders written for this patient under my direction.  Alyson Reedy, MD [phone]

## 2012-06-27 NOTE — Progress Notes (Signed)
eLink Physician-Brief Progress Note Patient Name: Matthew Juarez DOB: Mar 05, 1961 MRN: [id number]  Date of Service  06/27/2012   HPI/Events of Note  Hypokalemia   eICU Interventions  Potassium replaced   Intervention Category Minor Interventions: Electrolytes abnormality - evaluation and management  DETERDING,ELIZABETH 06/27/2012, 6:00 AM

## 2012-06-28 ENCOUNTER — Encounter (HOSPITAL_COMMUNITY): Payer: Self-pay

## 2012-06-28 DIAGNOSIS — R319 Hematuria, unspecified: Secondary | ICD-10-CM | POA: Diagnosis not present

## 2012-06-28 DIAGNOSIS — G0489 Other myelitis: Secondary | ICD-10-CM

## 2012-06-28 LAB — CBC
HCT: 21.5 % — ABNORMAL LOW (ref 39.0–52.0)
HCT: 23.4 % — ABNORMAL LOW (ref 39.0–52.0)
Hemoglobin: 7.5 g/dL — ABNORMAL LOW (ref 13.0–17.0)
Hemoglobin: 8.2 g/dL — ABNORMAL LOW (ref 13.0–17.0)
MCH: 29.1 pg (ref 26.0–34.0)
MCHC: 34.9 g/dL (ref 30.0–36.0)
RBC: 2.58 MIL/uL — ABNORMAL LOW (ref 4.22–5.81)
RBC: 2.81 MIL/uL — ABNORMAL LOW (ref 4.22–5.81)

## 2012-06-28 LAB — BASIC METABOLIC PANEL
BUN: 14 mg/dL (ref 6–23)
CO2: 27 mEq/L (ref 19–32)
Calcium: 8.5 mg/dL (ref 8.4–10.5)
Glucose, Bld: 103 mg/dL — ABNORMAL HIGH (ref 70–99)
Potassium: 3.3 mEq/L — ABNORMAL LOW (ref 3.5–5.1)
Sodium: 140 mEq/L (ref 135–145)

## 2012-06-28 LAB — TYPE AND SCREEN
ABO/RH(D): B POS
Antibody Screen: NEGATIVE
Unit division: 0

## 2012-06-28 LAB — APTT: aPTT: 29 seconds (ref 24–37)

## 2012-06-28 LAB — PROTIME-INR: INR: 1.09 (ref 0.00–1.49)

## 2012-06-28 LAB — HEPARIN LEVEL (UNFRACTIONATED): Heparin Unfractionated: <0.1 IU/mL — ABNORMAL LOW (ref 0.30–0.70)

## 2012-06-28 MED ORDER — POTASSIUM CHLORIDE 10 MEQ/50ML IV SOLN
10.0000 meq | INTRAVENOUS | Status: AC
  Administered 2012-06-28 (×6): 10 meq via INTRAVENOUS
  Filled 2012-06-28: qty 50
  Filled 2012-06-28: qty 250

## 2012-06-28 MED ORDER — HEPARIN (PORCINE) IN NACL 100-0.45 UNIT/ML-% IJ SOLN
2800.0000 [IU]/h | INTRAMUSCULAR | Status: DC
  Administered 2012-06-28: 1900 [IU]/h via INTRAVENOUS
  Administered 2012-06-29: 2000 [IU]/h via INTRAVENOUS
  Administered 2012-06-30: 2400 [IU]/h via INTRAVENOUS
  Administered 2012-06-30: 2800 [IU]/h via INTRAVENOUS
  Administered 2012-06-30: 2300 [IU]/h via INTRAVENOUS
  Filled 2012-06-28 (×9): qty 250

## 2012-06-28 NOTE — Progress Notes (Signed)
eLink Physician-Brief Progress Note Patient Name: Matthew Juarez DOB: January 19, 1961 MRN: [id number]  Date of Service  06/28/2012   HPI/Events of Note  hypokalemia   eICU Interventions  Potassium replaced   Intervention Category Minor Interventions: Electrolytes abnormality - evaluation and management  Devarious Pavek 06/28/2012, 6:13 AM

## 2012-06-28 NOTE — Progress Notes (Signed)
PT Cancellation Note  Patient Details Name: Matthew Juarez MRN: [id number] DOB: 04-27-1960   Cancelled Treatment:    Reason Eval/Treat Not Completed: Medical issues which prohibited therapy. Noted heparin has been held and is being restarted today for RLE DVT. Will hold PT until tomorrow per therapy guidelines.   [name] 06/28/2012, 9:11 AM Pager: [phone]

## 2012-06-28 NOTE — Progress Notes (Signed)
ANTICOAGULATION CONSULT NOTE - Initial Consult  Pharmacy Consult for heparin Indication: Recent DVT and presumed PE  No Known Allergies  Patient Measurements: Height: 6\' 3"  (190.5 cm) Weight: 331 lb 2.1 oz (150.2 kg) IBW/kg (Calculated) : 84.5 Heparin Dosing Weight: 119kg  Vital Signs: Temp: 98.6 F (37 C) (03/31 1130) Temp src: Oral (03/31 1130) BP: 166/85 mmHg (03/31 1130) Pulse Rate: 72 (03/31 1000)  Labs:  Recent Labs  06/25/12 1900 06/26/12 0420  06/27/12 0453 06/27/12 1110 06/28/12 0500  HGB  --  6.6*  < > 6.8* 7.8* 7.5*  HCT  --  19.4*  < > 19.9* 22.0* 21.5*  PLT  --  96*  < > 108* 122* 139*  APTT  --   --   --  23*  --  29  LABPROT  --   --   --  14.3  --  14.0  INR  --   --   --  1.13  --  1.09  HEPARINUNFRC 0.34 0.22*  --   --   --   --   CREATININE  --  1.46*  --  1.34  --  1.25  < > = values in this interval not displayed.  Estimated Creatinine Clearance: 107.1 ml/min (by C-G formula based on Cr of 1.25).   Medical History: Past Medical History  Diagnosis Date  . Chronic back pain   . Hypertension   . Gout   . Headache   . Arthritis     Medications:  Infusions:  . sodium chloride Stopped (06/26/12 1500)  . sodium chloride 10 mL/hr at 06/27/12 1500  . heparin      Assessment: Matthew Juarez admitted after PEA arrest likely caused by PE. Recent DVT. He had been started on IV heparin but this has been on hold d/t hematuria and drop in hemoglobin. Restarting today. H/H 7.5/21.5, plts 139, no overt bleeding noted.   Goal of Therapy:  Heparin level 0.3-0.7 units/ml Monitor platelets by anticoagulation protocol: Yes   Plan:  1. Restart heparin gtt at 1900 units/hr 2. Check an 8 hour heparin level 3. Daily heparin level and CBC 4. Monitor closely for signs/symptoms of bleeding 5. F/u initiation of oral anticoag  Bruce Mayers, Drake Leach 06/28/2012,1:08 PM

## 2012-06-28 NOTE — Progress Notes (Signed)
ANTICOAGULATION CONSULT NOTE - Follow Up Consult  Pharmacy Consult for heparin Indication: Recent DVT and presumed PE  Labs:  Recent Labs  06/26/12 0420  06/27/12 0453 06/27/12 1110 06/28/12 0500 06/28/12 1810 06/28/12 2205  HGB 6.6*  < > 6.8* 7.8* 7.5* 8.2*  --   HCT 19.4*  < > 19.9* 22.0* 21.5* 23.4*  --   PLT 96*  < > 108* 122* 139* 178  --   APTT  --   --  23*  --  29  --   --   LABPROT  --   --  14.3  --  14.0  --   --   INR  --   --  1.13  --  1.09  --   --   HEPARINUNFRC 0.22*  --   --   --   --   --  <0.10*  CREATININE 1.46*  --  1.34  --  1.25  --   --   < > = values in this interval not displayed.   Assessment: 51yo male undetectable on heparin after resumed at rate that was previously therapeutic; per RN gtt running without issue and no further signs of bleeding.  Goal of Therapy:  Heparin level 0.3-0.7 units/ml   Plan:  Will increase heparin gtt cautiously to 2000 units/hr given previously therapeutic levels and low Hgb and f/u with am labs.  [name], PharmD, BCPS  06/28/2012,11:04 PM

## 2012-06-28 NOTE — Progress Notes (Signed)
PULMONARY  / CRITICAL CARE MEDICINE  Name: Matthew Juarez MRN: [id number] DOB: 13-May-1960    ADMISSION DATE:  06/23/2012 CONSULTATION DATE: 06/23/2012  REFERRING MD :  EDP  CHIEF COMPLAINT:  Cardiac arrest  BRIEF PATIENT DESCRIPTION: 52 y/o recently admitted with cervicothoracic transverse myelitis, R distal femur fracture and R DVT admitted after suffering PEA arrest  Secondary to suspected PE.  SIGNIFICANT EVENTS / STUDIES:  3/26  Admitted after PEA arrest, PE suspected 3/26  Venous Doppler >>> POSITIVE >>> Findings consistent with deep vein thrombosis involving the right popliteal vein, right posterial tibial vein, and right peroneal vein. 3/26  2D echo >>> POSITIVE >>> EF 70%, severe RV dilation / disfunction  LINES / TUBES: OETT 3/26 >>>3-29 OGT 3/26 >>>3-28 R IJ TLC 3/26  >>> R fem A line 3/26 >>>3-28  CULTURES: 3/26 Blood 1/2 >>> GPC clusters >>  3/26 Urine >>> negative 3/26 Respiratory >>> staph aureus >> pan ss 3/26 MRSA screen >> negative  ANTIBIOTICS: unasyn 3/26 >>  vanco 3/27 >> 3/30  SUBJECTIVE:    VITAL SIGNS: Temp:  [98.4 F (36.9 C)-100.8 F (38.2 C)] 98.6 F (37 C) (03/31 1130) Pulse Rate:  [72-87] 72 (03/31 1000) Resp:  [12-30] 21 (03/31 1130) BP: (138-171)/(54-85) 166/85 mmHg (03/31 1130) SpO2:  [92 %-100 %] 92 % (03/31 1130) Weight:  [150.2 kg (331 lb 2.1 oz)] 150.2 kg (331 lb 2.1 oz) (03/31 0446)  HEMODYNAMICS:    VENTILATOR SETTINGS:    INTAKE / OUTPUT: Intake/Output     03/30 0701 - 03/31 0700 03/31 0701 - 04/01 0700   P.O. 240 240   I.V. (mL/kg) 460 (3.1) 30 (0.2)   Blood 262.5    IV Piggyback 450 200   Total Intake(mL/kg) 1412.5 (9.4) 470 (3.1)   Urine (mL/kg/hr) 2825 (0.8) 475 (0.5)   Total Output 2825 475   Net -1412.5 -5          PHYSICAL EXAMINATION: General:  Looks great Neuro:  Awake and alert, preexisting le weakness  HEENT:  PERRL,  Cardiovascular:  RRR, no murmurs Lungs:  Bilateral diminished air entry Abdomen:   Obese, soft, bowel sounds present Foley: hematuria resolved Musculoskeletal:  Trace edema, lower ext muscle wasting Skin:  intact  LABS:  Recent Labs Lab 06/23/12 1154  06/23/12 1400 06/23/12 1415  06/24/12 0144 06/24/12 0355 06/24/12 0515 06/24/12 1100  06/24/12 1714 06/24/12 1748  06/26/12 0420  06/27/12 0453 06/27/12 1110 06/28/12 0500  HGB 12.9*  --   --  11.6*  --   --   --  13.2  --   --   --   --   < > 6.6*  < > 6.8* 7.8* 7.5*  WBC 18.3*  --   --  20.7*  --   --   --  14.8*  --   --   --   --   < > 5.6  < > 6.5 7.7 8.1  PLT 134*  --   --  108*  --   --   --  180  --   --   --   --   < > 96*  < > 108* 122* 139*  NA 133*  --   --   --   --   --   --  137 138  < >  --   --   < > 143  --  141  --  140  K 5.6*  --   --   --   --   --   --  5.9* 5.1  < >  --   --   < > 3.1*  --  3.0*  --  3.3*  CL 93*  --   --   --   --   --   --  109 109  < >  --   --   < > 109  --  107  --  105  CO2 16*  --   --   --   --   --   --  15* 16*  < >  --   --   < > 27  --  27  --  27  GLUCOSE 337*  --   --   --   --   --   --  178* 149*  < >  --   --   < > 95  --  102*  --  103*  BUN 14  --   --   --   --   --   --  24* 25*  < >  --   --   < > 19  --  14  --  14  CREATININE 1.28  --   --   --   --   --   --  2.13* 2.08*  < >  --   --   < > 1.46*  --  1.34  --  1.25  CALCIUM 9.1  --   --   --   --   --   --  7.3* 7.4*  < >  --   --   < > 8.0*  --  8.1*  --  8.5  MG  --   --   --   --   --   --   --  1.6  --   --   --   --   --   --   --   --   --   --   PHOS  --   --   --   --   --   --   --  3.9  --   --   --   --   --   --   --   --   --   --   AST 218*  --   --   --   --   --   --  190*  --   --   --   --   --  50*  --   --   --   --   ALT 200*  --   --   --   --   --   --  204*  --   --   --   --   --  84*  --   --   --   --   ALKPHOS 129*  --   --   --   --   --   --  109  --   --   --   --   --  70  --   --   --   --   BILITOT 1.2  --   --   --   --   --   --  1.6*  --   --   --   --   --   1.3*  --   --   --   --   PROT 6.2  --   --   --   --   --   --  5.4*  --   --   --   --   --  4.5*  --   --   --   --   ALBUMIN 3.2*  --   --   --   --   --   --  2.8*  --   --   --   --   --  2.3*  --   --   --   --   APTT  --   --   --  58*  --   --   --   --   --   --   --   --   --   --   --  23*  --  29  INR  --   --   --  1.77*  --   --   --   --   --   --   --   --   --   --   --  1.13  --  1.09  LATICACIDVEN 13.0*  --  4.6*  --   --   --   --   --  1.4  --  1.2  --   --   --   --   --   --   --   PHART  --   < >  --   --   < > 7.206* 7.285*  --   --   --   --  7.447  --   --   --   --   --   --   PCO2ART  --   < >  --   --   < > 30.3* 32.0*  --   --   --   --  26.7*  --   --   --   --   --   --   PO2ART  --   < >  --   --   < > 159.0* 150.0*  --   --   --   --  125.0*  --   --   --   --   --   --   < > = values in this interval not displayed.  Recent Labs Lab 06/24/12 2330 06/25/12 0424 06/25/12 0754 06/25/12 1312 06/25/12 1631  GLUCAP 110* 126* 111* 105* 107*   .imaging: Dg Chest Port 1 View  06/27/2012  *RADIOLOGY REPORT*  Clinical Data: Fever, atelectasis, extubated  PORTABLE CHEST - 1 VIEW  Comparison: 06/25/2012  Findings: Interval removal of the endotracheal tube and NG tube. Right IJ central line tip remains in the mid SVC level.  Moderate posterior layering right effusion noted with increased right lung perihilar and lower lobe atelectasis / consolidation.  Minimal left base atelectasis persist.  Trachea is midline.  IMPRESSION: New moderate posterior layering right effusion with right lung atelectasis / consolidation.   Original Report Authenticated By: Judie Petit. Shick, M.D.      ASSESSMENT / PLAN:  PULMONARY A:  Acute respiratory failure.  Possible aspiration pneumonia. P:   -3-28 extubated and off O2  CARDIOVASCULAR A: PEA cardiac arrest in setting of suspected PE. Shock, cardiogenic, improved P:  CTA contraindicated at this time - AKI; consider V/Q at some point,  although I believe he is committed to anticoagulation for 6-12 mo anyway 3-29 hold heparin x 24 hours due to hematuria and 2 gm drop in hgb requiring transfusion 3-29. Continue to hold 3-30 and  restart 3-31 RENAL Lab Results  Component Value Date   CREATININE 1.25 06/28/2012   CREATININE 1.34 06/27/2012   CREATININE 1.46* 06/26/2012    A:  AKI. Hypokalemia P:   Replace K Follow BMP  GASTROINTESTINAL liver function tests  A:  Congestive hepatopathy in setting of shock. P:   Trend LFTs Protonix for DVT Px  HEMATOLOGIC  Recent Labs  06/27/12 1110 06/28/12 0500  HGB 7.8* 7.5*    A:  Anemia, thrombocytopenia -2 gm drop hemoglobin with hematuria P:  Trend CBC Heparin gtt per Pharmacy/3-29 hold drip  Till 3-31  INFECTIOUS A:  Suspected aspiration pneumonia. P:   Abx / Cx as above, plan 7 day course  ENDOCRINE CBG (last 3)   Recent Labs  06/25/12 1312 06/25/12 1631  GLUCAP 105* 107*    A:  Hyperglycemia.  Presumed adrenal insufficiency. P:   SSI Stress dose steroids >> d/c'd on 3/28  NEUROLOGIC A:  Acute encephalopathy.  Hx of transverse myelitis. Resolved 3-29 Wide awake. Residual lower ext weakness from preexisting transverse myelitis.  P:   -monitor  TODAY'S SUMMARY: 52 y/o recently admitted with cervicothoracic transverse myelitis, R distal femur fracture and R DVT admitted after suffering PEA arrest  Secondary to suspected PE. Venous Doppler suggestive of R DVT. 2D echo reports severe RV dysfunction / dilation. CTA deferred - AKI.  Emperical Heparin > restart 3/31, follow CBC and for more hematuria   Patient seen and examined, agree with above note.  I dictated the care and orders written for this patient under my direction.  Levy Pupa, MD, PhD 06/28/2012, 1:00 PM [facility] [phone] or if no answer [phone]

## 2012-06-28 NOTE — Progress Notes (Signed)
NUTRITION FOLLOW UP  Intervention:   No nutrition interventions warranted at this time  Nutrition Dx:   Inadequate oral intake now related to decreased appetite as evidenced by 25% meal completion  Goal:   PO intake to meet >/=90% estimated nutrition needs.   Monitor:   Po intake, weight trends, labs  Assessment:   Pt was extubated on Friday, diet was advanced to Heart Healthy. Pt states his appetite has not yet returned to normal, but improving. Denies any poor appetite or weight loss PTA. Some weight loss is appropriate for this pt.   Height: Ht Readings from Last 1 Encounters:  06/23/12 6\' 3"  (1.905 m)    Weight Status:   Wt Readings from Last 1 Encounters:  06/28/12 331 lb 2.1 oz (150.2 kg)    Re-estimated needs:  Kcal: 2400-2600 Protein: 100-110 gm  Fluid: 2.4-2.6 L   Skin: intact   Diet Order: Cardiac   Intake/Output Summary (Last 24 hours) at 06/28/12 1354 Last data filed at 06/28/12 1000  Gross per 24 hour  Intake   1500 ml  Output   2275 ml  Net   -775 ml    Last BM: PTA    Labs:   Recent Labs Lab 06/24/12 0515  06/26/12 0420 06/27/12 0453 06/28/12 0500  NA 137  < > 143 141 140  K 5.9*  < > 3.1* 3.0* 3.3*  CL 109  < > 109 107 105  CO2 15*  < > 27 27 27   BUN 24*  < > 19 14 14   CREATININE 2.13*  < > 1.46* 1.34 1.25  CALCIUM 7.3*  < > 8.0* 8.1* 8.5  MG 1.6  --   --   --   --   PHOS 3.9  --   --   --   --   GLUCOSE 178*  < > 95 102* 103*  < > = values in this interval not displayed.  CBG (last 3)   Recent Labs  06/25/12 1631  GLUCAP 107*    Scheduled Meds: . ampicillin-sulbactam (UNASYN) IV  3 g Intravenous Q6H  . polyethylene glycol  17 g Oral Daily  . potassium chloride  10 mEq Intravenous Q1 Hr x 6  . sodium chloride  1,000 mL Intravenous Once    Continuous Infusions: . sodium chloride Stopped (06/26/12 1500)  . sodium chloride 10 mL/hr at 06/27/12 1500  . heparin 1,900 Units/hr (06/28/12 1328)    Clarene Duke RD,  LDN Pager [phone] After Hours pager [phone]

## 2012-06-29 ENCOUNTER — Inpatient Hospital Stay (HOSPITAL_COMMUNITY): Payer: BC Managed Care – PPO

## 2012-06-29 LAB — BASIC METABOLIC PANEL
CO2: 26 mEq/L (ref 19–32)
Chloride: 107 mEq/L (ref 96–112)
Glucose, Bld: 94 mg/dL (ref 70–99)
Sodium: 142 mEq/L (ref 135–145)

## 2012-06-29 LAB — CULTURE, BLOOD (ROUTINE X 2): Culture: NO GROWTH

## 2012-06-29 LAB — CBC
HCT: 22.9 % — ABNORMAL LOW (ref 39.0–52.0)
Hemoglobin: 7.7 g/dL — ABNORMAL LOW (ref 13.0–17.0)
MCV: 84.5 fL (ref 78.0–100.0)
RBC: 2.71 MIL/uL — ABNORMAL LOW (ref 4.22–5.81)
WBC: 9.1 10*3/uL (ref 4.0–10.5)

## 2012-06-29 MED ORDER — AMLODIPINE BESYLATE 5 MG PO TABS
5.0000 mg | ORAL_TABLET | Freq: Every day | ORAL | Status: DC
  Administered 2012-06-29 – 2012-07-06 (×7): 5 mg via ORAL
  Filled 2012-06-29 (×9): qty 1

## 2012-06-29 MED ORDER — GABAPENTIN 300 MG PO CAPS
600.0000 mg | ORAL_CAPSULE | Freq: Three times a day (TID) | ORAL | Status: DC
  Administered 2012-06-29 – 2012-07-06 (×21): 600 mg via ORAL
  Filled 2012-06-29 (×22): qty 2

## 2012-06-29 MED ORDER — AMLODIPINE-OLMESARTAN 5-20 MG PO TABS: 1.0000 | ORAL_TABLET | Freq: Every day | ORAL | Status: DC

## 2012-06-29 MED ORDER — IRBESARTAN 150 MG PO TABS
150.0000 mg | ORAL_TABLET | Freq: Every day | ORAL | Status: DC
  Administered 2012-06-29 – 2012-07-06 (×8): 150 mg via ORAL
  Filled 2012-06-29 (×9): qty 1

## 2012-06-29 NOTE — Progress Notes (Addendum)
PULMONARY  / CRITICAL CARE MEDICINE  Name: Matthew Juarez MRN: [id number] DOB: December 12, 1960    ADMISSION DATE:  06/23/2012 CONSULTATION DATE: 06/23/2012  REFERRING MD :  EDP  CHIEF COMPLAINT:  Cardiac arrest  BRIEF PATIENT DESCRIPTION: 52 y/o recently admitted with cervicothoracic transverse myelitis, R distal femur fracture and R DVT admitted after suffering PEA arrest  Secondary to suspected PE.  SIGNIFICANT EVENTS / STUDIES:  3/26  Admitted after PEA arrest, PE suspected 3/26  Venous Doppler >>> POSITIVE >>> Findings consistent with deep vein thrombosis involving the right popliteal vein, right posterial tibial vein, and right peroneal vein. 3/26  2D echo >>> POSITIVE >>> EF 70%, severe RV dilation / disfunction  LINES / TUBES: OETT 3/26 >>>3-29 OGT 3/26 >>>3-28 R IJ TLC 3/26  >>> R fem A line 3/26 >>>3-28  CULTURES: 3/26 Blood 1/2 >>> coag negative staph 3/26 Urine >>> negative 3/26 Respiratory >>> MSSA 3/26 MRSA screen >> negative  ANTIBIOTICS: unasyn 3/26 >>  vanco 3/27 >> 3/30  SUBJECTIVE:   Feeling better No further hematuria  VITAL SIGNS: Temp:  [98.7 F (37.1 C)-99.7 F (37.6 C)] 99 F (37.2 C) (04/01 1145) Pulse Rate:  [67-88] 67 (04/01 1200) Resp:  [8-26] 20 (04/01 1200) BP: (138-173)/(66-89) 161/89 mmHg (04/01 1200) SpO2:  [95 %-100 %] 97 % (04/01 1200)  HEMODYNAMICS:    VENTILATOR SETTINGS:    INTAKE / OUTPUT: Intake/Output     03/31 0701 - 04/01 0700 04/01 0701 - 04/02 0700   P.O. 240 300   I.V. (mL/kg) 1628.8 (10.8) 210 (1.4)   Blood     IV Piggyback 200 100   Total Intake(mL/kg) 2068.8 (13.8) 610 (4.1)   Urine (mL/kg/hr) 3550 (1) 800 (1)   Stool 1 (0)    Total Output 3551 800   Net -1482.2 -190          PHYSICAL EXAMINATION: General:  Looks great Neuro:  Awake and alert, preexisting le weakness  HEENT:  PERRL,  Cardiovascular:  RRR, no murmurs Lungs:  Bilateral diminished air entry Abdomen:  Obese, soft, bowel sounds  present Foley: hematuria resolved Musculoskeletal:  Trace edema, lower ext muscle wasting Skin:  intact  LABS:  Recent Labs Lab 06/23/12 1154  06/23/12 1400 06/23/12 1415  06/24/12 0144 06/24/12 0355 06/24/12 0515 06/24/12 1100  06/24/12 1714 06/24/12 1748  06/26/12 0420  06/27/12 0453  06/28/12 0500 06/28/12 1810 06/29/12 0520  HGB 12.9*  --   --  11.6*  --   --   --  13.2  --   --   --   --   < > 6.6*  < > 6.8*  < > 7.5* 8.2* 7.7*  WBC 18.3*  --   --  20.7*  --   --   --  14.8*  --   --   --   --   < > 5.6  < > 6.5  < > 8.1 9.8 9.1  PLT 134*  --   --  108*  --   --   --  180  --   --   --   --   < > 96*  < > 108*  < > 139* 178 188  NA 133*  --   --   --   --   --   --  137 138  < >  --   --   < > 143  --  141  --  140  --  142  K 5.6*  --   --   --   --   --   --  5.9* 5.1  < >  --   --   < > 3.1*  --  3.0*  --  3.3*  --  3.7  CL 93*  --   --   --   --   --   --  109 109  < >  --   --   < > 109  --  107  --  105  --  107  CO2 16*  --   --   --   --   --   --  15* 16*  < >  --   --   < > 27  --  27  --  27  --  26  GLUCOSE 337*  --   --   --   --   --   --  178* 149*  < >  --   --   < > 95  --  102*  --  103*  --  94  BUN 14  --   --   --   --   --   --  24* 25*  < >  --   --   < > 19  --  14  --  14  --  14  CREATININE 1.28  --   --   --   --   --   --  2.13* 2.08*  < >  --   --   < > 1.46*  --  1.34  --  1.25  --  1.25  CALCIUM 9.1  --   --   --   --   --   --  7.3* 7.4*  < >  --   --   < > 8.0*  --  8.1*  --  8.5  --  8.6  MG  --   --   --   --   --   --   --  1.6  --   --   --   --   --   --   --   --   --   --   --   --   PHOS  --   --   --   --   --   --   --  3.9  --   --   --   --   --   --   --   --   --   --   --   --   AST 218*  --   --   --   --   --   --  190*  --   --   --   --   --  50*  --   --   --   --   --   --   ALT 200*  --   --   --   --   --   --  204*  --   --   --   --   --  84*  --   --   --   --   --   --   ALKPHOS 129*  --   --   --   --   --   --   109  --   --   --   --   --  70  --   --   --   --   --   --   BILITOT 1.2  --   --   --   --   --   --  1.6*  --   --   --   --   --  1.3*  --   --   --   --   --   --   PROT 6.2  --   --   --   --   --   --  5.4*  --   --   --   --   --  4.5*  --   --   --   --   --   --   ALBUMIN 3.2*  --   --   --   --   --   --  2.8*  --   --   --   --   --  2.3*  --   --   --   --   --   --   APTT  --   --   --  58*  --   --   --   --   --   --   --   --   --   --   --  23*  --  29  --   --   INR  --   --   --  1.77*  --   --   --   --   --   --   --   --   --   --   --  1.13  --  1.09  --   --   LATICACIDVEN 13.0*  --  4.6*  --   --   --   --   --  1.4  --  1.2  --   --   --   --   --   --   --   --   --   PHART  --   < >  --   --   < > 7.206* 7.285*  --   --   --   --  7.447  --   --   --   --   --   --   --   --   PCO2ART  --   < >  --   --   < > 30.3* 32.0*  --   --   --   --  26.7*  --   --   --   --   --   --   --   --   PO2ART  --   < >  --   --   < > 159.0* 150.0*  --   --   --   --  125.0*  --   --   --   --   --   --   --   --   < > = values in this interval not displayed.  Recent Labs Lab 06/24/12 2330 06/25/12 0424 06/25/12 0754 06/25/12 1312 06/25/12 1631  GLUCAP 110* 126* 111* 105* 107*   .imaging: No results found.   ASSESSMENT / PLAN:  PULMONARY A:  Acute respiratory failure.  Possible aspiration pneumonia > MSSA  P:   -pulm toilet -complete abx as below  CARDIOVASCULAR A: PEA cardiac arrest in setting of suspected PE.  Shock, cardiogenic,  improved P:  CTA contraindicated at this time - AKI; consider V/Q at some point, although I believe he is committed to anticoagulation for 6-12 mo anyway 3-29 held heparin x 24 hours due to hematuria and 2 gm drop in hgb requiring transfusion 3/29.  - heparin restarted 3/31, Hgb 8.2 >> 7.7 am 4/1; will continue heparin and follow hematuria, CBC - restart home amlodipine + ARB 4/1  RENAL Lab Results  Component Value Date    CREATININE 1.25 06/29/2012   CREATININE 1.25 06/28/2012   CREATININE 1.34 06/27/2012    A:  AKI.  Hypokalemia, resolved P:   Replace K Follow BMP  GASTROINTESTINAL liver function tests  A:  Congestive hepatopathy in setting of shock. P:   Trend LFTs Protonix for DVT Px  HEMATOLOGIC  Recent Labs  06/28/12 1810 06/29/12 0520  HGB 8.2* 7.7*    A:  Anemia, thrombocytopenia -2 gm drop hemoglobin with hematuria P:  Trend CBC Heparin gtt restarted 3/31, tolerating  INFECTIOUS A:  Suspected aspiration pneumonia. P:   Abx / Cx as above, plan 7 day course >> d/c after dose 4/1  ENDOCRINE CBG (last 3)  No results found for this basename: GLUCAP,  in the last 72 hours  A:  Hyperglycemia.  Presumed adrenal insufficiency. P:   D/c SSI Stress dose steroids >> d/c'd on 3/28  NEUROLOGIC A:  Acute encephalopathy.  Hx of transverse myelitis. Resolved 3-29 Wide awake. Residual lower ext weakness from preexisting transverse myelitis.  P:   -monitor - restart neurontin   GLOBAL - hx r foot fx, he is concerned that he re-injured the foot. Will recheck plain films.   TODAY'S SUMMARY: 53 y/o recently admitted with cervicothoracic transverse myelitis, R distal femur fracture and R DVT admitted after suffering PEA arrest  Secondary to suspected PE. Venous Doppler suggestive of R DVT. 2D echo reports severe RV dysfunction / dilation. CTA deferred - AKI.  Emperical Heparin > restarted 3/31, follow CBC and for more hematuria. Transfer to floor 4/1   Patient seen and examined, agree with above note.  I dictated the care and orders written for this patient under my direction.  Levy Pupa, MD, PhD 06/29/2012, 12:09 PM [facility] [phone] or if no answer [phone]

## 2012-06-29 NOTE — Progress Notes (Signed)
ANTICOAGULATION CONSULT NOTE - Initial Consult  Pharmacy Consult for heparin Indication: Recent DVT and presumed PE  No Known Allergies  Patient Measurements: Height: 6\' 3"  (190.5 cm) Weight: 331 lb 2.1 oz (150.2 kg) IBW/kg (Calculated) : 84.5 Heparin Dosing Weight: 119kg  Vital Signs: Temp: 99 F (37.2 C) (04/01 1145) Temp src: Oral (04/01 1145) BP: 161/89 mmHg (04/01 1200) Pulse Rate: 67 (04/01 1200)  Labs:  Recent Labs  06/27/12 0453  06/28/12 0500 06/28/12 1810 06/28/12 2205 06/29/12 0520 06/29/12 1340  HGB 6.8*  < > 7.5* 8.2*  --  7.7*  --   HCT 19.9*  < > 21.5* 23.4*  --  22.9*  --   PLT 108*  < > 139* 178  --  188  --   APTT 23*  --  29  --   --   --   --   LABPROT 14.3  --  14.0  --   --   --   --   INR 1.13  --  1.09  --   --   --   --   HEPARINUNFRC  --   --   --   --  <0.10* <0.10* 0.21*  CREATININE 1.34  --  1.25  --   --  1.25  --   < > = values in this interval not displayed.  Estimated Creatinine Clearance: 107.1 ml/min (by C-G formula based on Cr of 1.25).   Medical History: Past Medical History  Diagnosis Date  . Chronic back pain   . Hypertension   . Gout   . Headache   . Arthritis     Medications:  Infusions:  . sodium chloride Stopped (06/26/12 1500)  . sodium chloride 10 mL/hr at 06/27/12 1500  . heparin 2,200 Units/hr (06/29/12 1610)    Assessment: 51 yom admitted after PEA arrest likely caused by PE. Recent DVT. He had been started on IV heparin but this has been on hold d/t hematuria and drop in hemoglobin. Restarted 3/31 Heparin drip 2200 uts/hr HL 0.21 which is less that goal. H/H 7.7/22, plts 188, no overt bleeding noted.     Goal of Therapy:  Heparin level 0.3-0.7 units/ml Monitor platelets by anticoagulation protocol: Yes   Plan:  Increase Heparin drip 2300 uts/hr Daily CBC and heparin level  Leota Sauers Pharm.D. CPP, BCPS Clinical Pharmacist [phone] 06/29/2012 2:29 PM

## 2012-06-29 NOTE — Progress Notes (Signed)
ANTICOAGULATION CONSULT NOTE - Follow Up Consult  Pharmacy Consult for heparin Indication: Recent DVT and presumed PE  Labs:  Recent Labs  06/27/12 0453  06/28/12 0500 06/28/12 1810 06/28/12 2205 06/29/12 0520  HGB 6.8*  < > 7.5* 8.2*  --  7.7*  HCT 19.9*  < > 21.5* 23.4*  --  22.9*  PLT 108*  < > 139* 178  --  188  APTT 23*  --  29  --   --   --   LABPROT 14.3  --  14.0  --   --   --   INR 1.13  --  1.09  --   --   --   HEPARINUNFRC  --   --   --   --  <0.10* <0.10*  CREATININE 1.34  --  1.25  --   --   --   < > = values in this interval not displayed.   Assessment: 51yo male remains undetectable on heparin despite rate increase.  Goal of Therapy:  Heparin level 0.3-0.7 units/ml   Plan:  Will increase heparin gtt by 1-2 units/kg/hr to 2200 units/hr given previously therapeutic levels and low Hgb and check level in 6hr.  Matthew Juarez, PharmD, BCPS  06/29/2012,6:36 AM

## 2012-06-29 NOTE — Progress Notes (Signed)
transferred to 5509 by bed, stable. Belongings with family, report given to RN.

## 2012-06-29 NOTE — Progress Notes (Signed)
NURSING PROGRESS NOTE  Matthew Juarez [id number] Transfer Data: 06/29/2012 6:44 PM Attending Provider: Leslye Peer, MD WUJ:[id number],WGNFAOZH R., MD Code Status: full  Matthew Juarez is a 52 y.o. male patient transferred from 2900 -No acute distress noted.  -No complaints of shortness of breath.  -No complaints of chest pain.   Blood pressure 140/79, pulse 75, temperature 99.5 F (37.5 C), temperature source Oral, resp. rate 15, height 6\' 3"  (1.905 m), weight 150.2 kg (331 lb 2.1 oz), SpO2 99.00%.   IV Fluids:  IV in place, occlusive dsg intact without redness, IV cath internal jugular right, condition patent and no redness normal saline.   Allergies:  Review of patient's allergies indicates no known allergies.  Past Medical History:   has a past medical history of Chronic back pain; Hypertension; Gout; Headache; and Arthritis.  Past Surgical History:   has no past surgical history on file.  Social History:   reports that he has never smoked. He does not have any smokeless tobacco history on file. He reports that  drinks alcohol. He reports that he does not use illicit drugs.  Skin: warm dry and intact  Patient/Family orientated to room. Will continue to evaluate and treat per MD orders.

## 2012-06-29 NOTE — Progress Notes (Signed)
PT Cancellation Note  Patient Details Name: Matthew Juarez MRN: [id number] DOB: 1961-03-31   Cancelled Treatment:    Reason Eval/Treat Not Completed: Patient leaving the floor for x-ray of his right foot. Will hold PT for today and f/u tomorrow. Recommend an OT consult.   [facility] HELEN 06/29/2012, 2:45 PM Pager: [phone]

## 2012-06-30 LAB — BASIC METABOLIC PANEL
BUN: 15 mg/dL (ref 6–23)
CO2: 27 mEq/L (ref 19–32)
Chloride: 106 mEq/L (ref 96–112)
Creatinine, Ser: 1.29 mg/dL (ref 0.50–1.35)

## 2012-06-30 LAB — CBC
HCT: 23.1 % — ABNORMAL LOW (ref 39.0–52.0)
MCV: 84.9 fL (ref 78.0–100.0)
RBC: 2.72 MIL/uL — ABNORMAL LOW (ref 4.22–5.81)
RDW: 14.5 % (ref 11.5–15.5)
WBC: 8.8 10*3/uL (ref 4.0–10.5)

## 2012-06-30 MED ORDER — WARFARIN - PHARMACIST DOSING INPATIENT
Freq: Every day | Status: DC
  Administered 2012-07-01 – 2012-07-04 (×3)

## 2012-06-30 MED ORDER — WARFARIN SODIUM 10 MG PO TABS
10.0000 mg | ORAL_TABLET | Freq: Once | ORAL | Status: AC
  Administered 2012-06-30: 10 mg via ORAL
  Filled 2012-06-30: qty 1

## 2012-06-30 MED ORDER — ENOXAPARIN SODIUM 150 MG/ML ~~LOC~~ SOLN
1.0000 mg/kg | Freq: Two times a day (BID) | SUBCUTANEOUS | Status: DC
  Administered 2012-06-30 – 2012-07-05 (×10): 135 mg via SUBCUTANEOUS
  Filled 2012-06-30 (×12): qty 1

## 2012-06-30 NOTE — Progress Notes (Signed)
PULMONARY  / CRITICAL CARE MEDICINE  Name: Matthew Juarez MRN: [id number] DOB: Mar 03, 1961    ADMISSION DATE:  06/23/2012 CONSULTATION DATE: 06/23/2012  REFERRING MD :  EDP  CHIEF COMPLAINT:  Cardiac arrest  BRIEF PATIENT DESCRIPTION: 52 y/o recently admitted with cervicothoracic transverse myelitis, R distal femur fracture and R DVT admitted after suffering PEA arrest  Secondary to suspected PE.  SIGNIFICANT EVENTS / STUDIES:  3/26  Admitted after PEA arrest, PE suspected 3/26  Venous Doppler >>> POSITIVE >>> Findings consistent with deep vein thrombosis involving the right popliteal vein, right posterial tibial vein, and right peroneal vein. 3/26  2D echo >>> POSITIVE >>> EF 70%, severe RV dilation / disfunction  LINES / TUBES: OETT 3/26 >>>3-29 OGT 3/26 >>>3-28 R IJ TLC 3/26  >>> R fem A line 3/26 >>>3-28  CULTURES: 3/26 Blood 1/2 >>> coag negative staph 3/26 Urine >>> negative 3/26 Respiratory >>> MSSA 3/26 MRSA screen >> negative  ANTIBIOTICS: unasyn 3/26 >>  vanco 3/27 >> 3/30  SUBJECTIVE:   He is feeling better, breathing well. No CP. No new complaints.   VITAL SIGNS: Temp:  [98.1 F (36.7 C)-99.5 F (37.5 C)] 98.1 F (36.7 C) (04/02 0617) Pulse Rate:  [70-84] 70 (04/02 0617) Resp:  [15-20] 20 (04/02 0617) BP: (137-160)/(66-80) 137/80 mmHg (04/02 0617) SpO2:  [97 %-100 %] 97 % (04/02 0617) Weight:  [136.261 kg (300 lb 6.4 oz)] 136.261 kg (300 lb 6.4 oz) (04/02 0617)  HEMODYNAMICS:    VENTILATOR SETTINGS:    INTAKE / OUTPUT: Intake/Output     04/01 0701 - 04/02 0700 04/02 0701 - 04/03 0700   P.O. 500    I.V. (mL/kg) 686 (5)    IV Piggyback 200    Total Intake(mL/kg) 1386 (10.2)    Urine (mL/kg/hr) 2950 (0.9) 1400 (1.6)   Stool     Total Output 2950 1400   Net -1564 -1400          PHYSICAL EXAMINATION: General:  Well appearing male in a NAD Neuro:  Awake and alert, preexisting le weakness. Follows commands and responds appropriately.  HEENT:   PERRL,  Cardiovascular:  RRR, no murmurs Lungs:  Clear bilaterally.   Abdomen:  Obese, soft, bowel sounds present Foley: hematuria resolved Musculoskeletal:  Trace edema, lower ext muscle wasting Skin:  intact  LABS:  Recent Labs Lab 06/28/12 0500 06/29/12 0520 06/30/12 0505  NA 140 142 141  K 3.3* 3.7 3.7  CL 105 107 106  CO2 27 26 27   BUN 14 14 15   CREATININE 1.25 1.25 1.29  GLUCOSE 103* 94 93    Recent Labs Lab 06/28/12 1810 06/29/12 0520 06/30/12 0505  HGB 8.2* 7.7* 7.9*  HCT 23.4* 22.9* 23.1*  WBC 9.8 9.1 8.8  PLT 178 188 203   Lab Results  Component Value Date   INR 1.09 06/28/2012   INR 1.13 06/27/2012   INR 1.77* 06/23/2012      Recent Labs Lab 06/24/12 2330 06/25/12 0424 06/25/12 0754 06/25/12 1312 06/25/12 1631  GLUCAP 110* 126* 111* 105* 107*   .imaging: Dg Foot 2 Views Right  06/29/2012  *RADIOLOGY REPORT*  Clinical Data: Pain at right lateral malleolus, history of foot fracture January 2014, fell again 1 week ago  RIGHT FOOT - 2 VIEW  Comparison: None  Findings: Mild diffuse osseous demineralization. Minimal hallux valgus. Joint spaces preserved. Small plantar calcaneal spur. Soft tissue swelling at dorsum of right foot. No acute fracture, dislocation, or bone destruction.  IMPRESSION:  No acute osseous abnormalities.   Original Report Authenticated By: Ulyses Southward, M.D.     Resolved issues Acute resp failure in setting of PE PEA cardiac arrest in setting of suspected PE.  Shock, cardiogenic, improved Hypokalemia (resolved) Relative adrenal insuff. (now off steroids)  ASSESSMENT / PLAN:  PE/DVT: presume propagated by immobility.  Anemia, thrombocytopenia -2 gm drop hemoglobin with hematuria -Hematuria resolved 3/31 - 4/2 Hgb 7.9 P:  -Trend CBC -Change heparin to lovenox  -consider V/Q at some point, although I believe he is committed to anticoagulation for 6-12 -Start warfarin  H/o HTN P:  See mar  AKI. -->resolving  P:   Follow  BMP   Congestive hepatopathy in setting of shock. P:   Trend LFTs Protonix for DVT Px  Suspected aspiration pneumonia. P:   Abx / Cx as above, plan 7 day course >> d/c 4/3  Acute encephalopathy.  Hx of transverse myelitis. Resolved 3-29 Wide awake. Residual lower ext weakness from preexisting transverse myelitis.  P:   -monitor - restart neurontin   Deconditioning  P: PT/OT consult, possible CIR vs SNF   TODAY'S SUMMARY: 52 y/o recently admitted with cervicothoracic transverse myelitis, R distal femur fracture and R DVT admitted after suffering PEA arrest  Secondary to suspected PE. Venous Doppler suggestive of R DVT. 2D echo reports severe RV dysfunction / dilation. CTA deferred - AKI.  Emperical Heparin > restarted 3/31, follow CBC and for more hematuria. Transfer to floor 4/1  Patient seen and examined, agree with above note.  I dictated the care and orders written for this patient under my direction.  Alyson Reedy, MD [phone]

## 2012-06-30 NOTE — Progress Notes (Signed)
Physical Therapy Treatment Patient Details Name: Matthew Juarez MRN: [id number] DOB: 10-05-1960 Today's Date: 06/30/2012 Time: 4098-1191 PT Time Calculation (min): 49 min  PT Assessment / Plan / Recommendation Comments on Treatment Session  Pt progressing to standing at bedside with RW and +2 assist - Pt stood 2 min and almost 5 min with RW adn assist to maintain balance    Follow Up Recommendations  CIR     Does the patient have the potential to tolerate intense rehabilitation     Barriers to Discharge        Equipment Recommendations  None recommended by PT    Recommendations for Other Services Rehab consult  Frequency Min 3X/week   Plan Discharge plan remains appropriate    Precautions / Restrictions Precautions Precautions: Fall Required Braces or Orthoses: Other Brace/Splint Other Brace/Splint: ankle brace on R Restrictions Weight Bearing Restrictions: No RLE Weight Bearing: Weight bearing as tolerated Other Position/Activity Restrictions: No orders for any weight bearing restrictions   Pertinent Vitals/Pain Pt reports R ankle discomfort - assisted with donning of brace    Mobility  Bed Mobility Bed Mobility: Rolling Left;Supine to Sit;Sit to Supine Rolling Left: 1: +2 Total assist;With rail Rolling Left: Patient Percentage: 70% Left Sidelying to Sit: 1: +2 Total assist;With rails Left Sidelying to Sit: Patient Percentage: 60% Supine to Sit: 1: +2 Total assist Supine to Sit: Patient Percentage: 60% Sitting - Scoot to Edge of Bed: 3: Mod assist Sit to Supine: 1: +2 Total assist;HOB flat Sit to Supine: Patient Percentage: 50% Details for Bed Mobility Assistance: Verbal cues for sequencing and technique.  Patient required +2 assist for all bed mobility.  Patient sat at EOB x 8 minutes, working on upright posture and balance with min assist and bil UE support. Transfers Transfers: Sit to Stand;Stand to Sit Sit to Stand: 1: +2 Total assist;With upper extremity  assist;From bed;From elevated surface Sit to Stand: Patient Percentage: 60% Stand to Sit: 1: +2 Total assist;To elevated surface;To bed;With upper extremity assist Stand to Sit: Patient Percentage: 60% Details for Transfer Assistance: cues for use of UEs to self assist;  assist to block R knee and bring wt over LEs Ambulation/Gait Ambulation/Gait Assistance: Not tested (comment) Assistive device: Rolling walker    Exercises     PT Diagnosis:    PT Problem List:   PT Treatment Interventions:     PT Goals Acute Rehab PT Goals PT Goal Formulation: With patient/family Time For Goal Achievement: 07/10/12 Potential to Achieve Goals: Good Pt will Roll Supine to Right Side: with min assist;with rail PT Goal: Rolling Supine to Right Side - Progress: Progressing toward goal Pt will Roll Supine to Left Side: with min assist;with rail PT Goal: Rolling Supine to Left Side - Progress: Progressing toward goal Pt will go Supine/Side to Sit: with min assist;with HOB 0 degrees PT Goal: Supine/Side to Sit - Progress: Progressing toward goal Pt will Sit at Edge of Bed: with supervision;6-10 min;with unilateral upper extremity support PT Goal: Sit at [place] Of Bed - Progress: Progressing toward goal Pt will go Sit to Supine/Side: with mod assist;with HOB 0 degrees PT Goal: Sit to Supine/Side - Progress: Progressing toward goal Pt will go Sit to Stand: with +2 total assist (pt 75% from elevated surface) PT Goal: Sit to Stand - Progress: Goal set today Pt will go Stand to Sit: with +2 total assist (pt 75% to elevated surface) PT Goal: Stand to Sit - Progress: Goal set today Pt will Transfer Bed  to Chair/Chair to Bed: with min assist  Visit Information  Last PT Received On: 06/30/12 Assistance Needed: +2    Subjective Data  Subjective: It feels so good to stand up Patient Stated Goal: To be able to return home.   Cognition  Cognition Overall Cognitive Status: Appears within functional limits for  tasks assessed/performed Arousal/Alertness: Awake/alert Orientation Level: Appears intact for tasks assessed Behavior During Session: [facility] for tasks performed    Balance  Static Sitting Balance Static Sitting - Balance Support: Bilateral upper extremity supported;Feet supported Static Sitting - Level of Assistance: 5: Stand by assistance Static Sitting - Comment/# of Minutes: 8 Static Standing Balance Static Standing - Balance Support: Bilateral upper extremity supported Static Standing - Level of Assistance: 1: +2 Total assist (65%) Static Standing - Comment/# of Minutes: 2 min and 5 min with assist to shift wt fwd over LEs and to block R knee  End of Session PT - End of Session Equipment Utilized During Treatment: Gait belt;Oxygen Activity Tolerance: Patient tolerated treatment well;Patient limited by fatigue Patient left: in bed;with call bell/phone within reach;with family/visitor present Nurse Communication: Mobility status;Need for lift equipment   GP     Whitleigh Garramone 06/30/2012, 4:56 PM

## 2012-06-30 NOTE — Progress Notes (Signed)
ANTICOAGULATION CONSULT NOTE - Follow Up Consult  Pharmacy Consult for heparin Indication: Recent DVT and presumed PE  No Known Allergies  Patient Measurements: Height: 6\' 3"  (190.5 cm) Weight: 300 lb 6.4 oz (136.261 kg) IBW/kg (Calculated) : 84.5 Heparin Dosing Weight: 119kg  Vital Signs: Temp: 98.1 F (36.7 C) (04/02 0617) Temp src: Oral (04/02 0617) BP: 137/80 mmHg (04/02 0617) Pulse Rate: 70 (04/02 0617)  Labs:  Recent Labs  06/28/12 0500 06/28/12 1810  06/29/12 0520 06/29/12 1340 06/30/12 0505  HGB 7.5* 8.2*  --  7.7*  --  7.9*  HCT 21.5* 23.4*  --  22.9*  --  23.1*  PLT 139* 178  --  188  --  203  APTT 29  --   --   --   --   --   LABPROT 14.0  --   --   --   --   --   INR 1.09  --   --   --   --   --   HEPARINUNFRC  --   --   < > <0.10* 0.21* 0.26*  CREATININE 1.25  --   --  1.25  --   --   < > = values in this interval not displayed.  Estimated Creatinine Clearance: 101.7 ml/min (by C-G formula based on Cr of 1.25).   Medical History: Past Medical History  Diagnosis Date  . Chronic back pain   . Hypertension   . Gout   . Headache   . Arthritis     Medications:  Infusions:  . sodium chloride Stopped (06/26/12 1500)  . heparin 2,300 Units/hr (06/30/12 0141)  . [DISCONTINUED] sodium chloride 10 mL/hr at 06/27/12 1500    Assessment: 51 yom admitted after PEA arrest likely caused by PE. Recent DVT. He had been started on IV heparin but this has been on hold d/t hematuria and drop in hemoglobin. Heparin infusion restarted 3/31.   Heparin level (0.26) is below-goal on 2300 units/hr. No problem with line / infusion and no bleeding per RN.   Goal of Therapy:  Heparin level 0.3-0.7 units/ml Monitor platelets by anticoagulation protocol: Yes   Plan:  1. Increase IV heparin to 2400 units/hr.  2. Heparin level in 6 hours.   Lorre Munroe, PharmD 06/30/2012 6:28 AM

## 2012-06-30 NOTE — Discharge Summary (Signed)
Physician Discharge Summary     Patient ID: Matthew Juarez MRN: [id number] DOB/AGE: 07-Jun-1960 52 y.o.  Admit date: 06/23/2012 Discharge date: 07/06/2012  Admission Diagnoses: PEA arrest  Pulmonary emboli   Discharge Diagnoses:   Closed right ankle fracture Transverse myelitis Acute respiratory failure Cardiac arrest Shock Hematuria Pulmonary Embolism PEA Arrest  Right Pleural Effusion  Significant Hospital tests/ studies/ interventions and procedures   SIGNIFICANT EVENTS / STUDIES:  3/26 - Admitted after PEA arrest, PE suspected  3/26 - Venous Doppler >>> POSITIVE >>> Findings consistent with deep vein thrombosis involving the right popliteal vein, right posterial tibial vein, and right peroneal vein.  3/26 - 2D echo >>> POSITIVE >>> EF 70%, severe RV dilation / disfunction 4/03 - R Thoracentesis, 1500 bloody fluid removed, c/w exudative effusion 4/05 - Loose stools, C-Diff positive PCR, flagyl started 4/07 - changed to oral vanco with coumadin / flagyl interaction   LINES / TUBES:  OETT 3/26 >>>3-29  OGT 3/26 >>>3-28  R IJ TLC 3/26 >>>  R fem A line 3/26 >>>3-28   CULTURES:  3/26 Blood 1/2 >>> coag negative staph  3/26 Urine >>> negative  3/26 Respiratory >>> MSSA  3/26 MRSA screen >> negative   ANTIBIOTICS:  unasyn 3/26 >>  vanco 3/27 >> 3/30 Flagyl 4/5>>>4/7 (c-diff, changed to vanco in setting of coumadin) Vanco PO 4/7>>>  Hospital course   52 y/o recently admitted with cervicothoracic transverse myelitis, R distal femur fracture and R DVT admitted 3/26 after suffering PEA arrest w/ 17 minutes CPR, Secondary to suspected PE. He was intubated and placed on the mechanical vent. Admitting echo showed: severe RV dysfunction / dilation. Received TNKase 50mg  on admit and subsequently started on heparin gtt. Post arrest was in cardiogenic shock from right heart failure. Was supported w/ vasopressor support in addition to volume resuscitation. Follow up eval  demonstrated:  Venous Doppler suggestive of R DVT. 2D echo reports severe RV dysfunction / dilation. CTA was deferred due to AKI.   His course was complicated by MSSA pneumonia which was likely an aspiration event. He was treated with a full course of antibiotics. He had dramatic cardio-pulmonary response to TNK and heparin. His shock rapidly resolved and we were able to wean him off the vent and extubate on 3/29. He did have some excess bleeding and  heparin was held for 24 hrs due to hematuria and and 2 gm drop in hgb. Restarted on 3/31 as hematuria resolved and hgb stabilized. He further was noted to have a right pleural effusion and underwent thoracentesis on 4/3 with 1500 ml bloody fluid removed / exudative effusion.  CXR further cleared to minimal atelectasis on 4/7.  4/5 he was noted to have loose stools and was noted to have C-Diff positive PCR. Treated with flagyl and changed to PO vancomycin 4/7 for completion of treatment.       Resolved issues   Acute resp failure in setting of PE  PEA cardiac arrest in setting of suspected PE.  Shock, cardiogenic, improved  Hypokalemia (resolved)  Relative adrenal insuff. (now off steroids)   Discharge Instructions by Diagnosis  PE/DVT: presume propagated by immobility / hx DVT RLE.  Anemia, thrombocytopenia    P:  -Has completed 7d overlap of LMWH and coumadin  -likely committed to anticoagulation for 6-12 if not longer as he had DVT prior to presentation -warfarin  -Next INR to be drawn 4/10 at home per [facility] RN and results faxed / called to Dr. Renae Gloss for  adjustment -Goal INR 2-3, wil go home on 12.5 mg daily for 5d/week and 10mg  two times a week (as tentative d/c plan)  H/o HTN   P:  -Continue home medications -f/u with Dr. Renae Gloss for BP review  AKI  P:  Follow up BMP   Congestive hepatopathy in setting of shock.   P:  -no further follow up at this time.  Resolved.   Suspected aspiration pneumonia.   P:  -completed abx as  above  Acute encephalopathy - resolved.  Hx of transverse myelitis.  Deconditioning   P:  -continue home neurontin -Home PT / OT / RN / Aide   Clostridium Difficile - PCR positive on 4/5 with loose stools.    P: -continue oral vancomycin until 4/19 -yogurt encouraged  -no anti-diarrheals (discussed with patient & wife) -follow up with PCP  Discharge Exam: BP 115/67  Pulse 62  Temp(Src) 98.2 F (36.8 C) (Oral)  Resp 16  Ht 6\' 3"  (1.905 m)  Wt 121.1 kg (266 lb 15.6 oz)  BMI 33.37 kg/m2  SpO2 96% Room air  PHYSICAL EXAMINATION:  General: Well appearing male in a NAD  Neuro: Awake and alert, preexisting le weakness. Follows commands and responds appropriately.  HEENT: PERRL,  Cardiovascular: RRR, no murmurs  Lungs: Clear bilaterally.  Abdomen: Obese, soft, bowel sounds present  Foley: hematuria resolved  Musculoskeletal: Trace edema, lower ext muscle wasting  Skin: intact  Labs at discharge Lab Results  Component Value Date   CREATININE 1.30 07/03/2012   BUN 18 07/03/2012   NA 137 07/03/2012   K 3.8 07/03/2012   CL 101 07/03/2012   CO2 28 07/03/2012   Lab Results  Component Value Date   WBC 7.7 07/04/2012   HGB 8.7* 07/04/2012   HCT 26.0* 07/04/2012   MCV 85.2 07/04/2012   PLT 266 07/04/2012   Lab Results  Component Value Date   ALT 84* 06/26/2012   AST 50* 06/26/2012   ALKPHOS 70 06/26/2012   BILITOT 1.3* 06/26/2012   Lab Results  Component Value Date   INR 2.00* 07/06/2012   INR 1.75* 07/05/2012   INR 1.55* 07/04/2012    Current radiology studies No results found.  Disposition:  01-Home or Self Care      Discharge Orders   Future Appointments Provider Department Dept Phone   07/23/2012 3:15 PM Leslye Peer, MD [facility] [phone]   Future Orders Complete By Expires     Call MD for:  severe uncontrolled pain  As directed     Call MD for:  temperature >100.4  As directed     Diet - low sodium heart healthy  As directed     Increase activity slowly   As directed         Medication List    TAKE these medications       allopurinol 300 MG tablet  Commonly known as:  ZYLOPRIM  Take 1 tablet (300 mg total) by mouth daily.     AZOR 5-20 MG per tablet  Generic drug:  amLODipine-olmesartan  Take 1 tablet by mouth daily.     Co Q-10 200 MG Caps  Take 1 capsule by mouth daily.     esomeprazole 20 MG capsule  Commonly known as:  NEXIUM  Take 20 mg by mouth daily before breakfast.     gabapentin 300 MG capsule  Commonly known as:  NEURONTIN  Take 600 mg by mouth 3 (three) times daily.  HYDROcodone-acetaminophen 5-325 MG per tablet  Commonly known as:  NORCO/VICODIN  Take 1-2 tablets by mouth every 4 (four) hours as needed for pain.     NONFORMULARY OR COMPOUNDED ITEM  2 each by Other route daily.     vancomycin 50 mg/mL oral solution  Commonly known as:  VANCOCIN  Take 2.5 mLs (125 mg total) by mouth every 6 (six) hours.     Vitamin D 2000 UNITS Caps  Take 2,000 Units by mouth daily.     warfarin 5 MG tablet  Commonly known as:  COUMADIN  Take 12.5mg  (2.5 tabs) every Monday-fridays, take 10mg  sat and Sunday (2 tabs)--.adjsut as directed       Follow-up Information   Follow up with Leslye Peer., MD On 07/23/2012. (Appt at 3:15 PM)    Contact information:   [address] [place] [address] [phone]       Follow up with Alva Garnet., MD On 07/09/2012. (Appt at 8:30.  )    Contact information:   [address] [place] [address] [phone]       Follow up with INR .   Contact information:   Home Health RN to fax results to Dr. Renae Gloss for adjustments.       Follow up with [facility] . [facility] RN, Physical Therapy, Occupational Therapy, and aide)    Contact information:   [phone]      Discharged Condition: good  Physician Statement:   The Patient was personally examined, the discharge assessment and plan has been personally reviewed and I agree with  ACNP Babcock's assessment and plan. > 30 minutes of time have been dedicated to discharge assessment, planning and discharge instructions.   SignedAnders Simmonds 07/06/2012, 9:39 AM   Coralyn Helling, MD [facility] Pulmonary/Critical Care 07/06/2012, 12:21 PM Pager:  [phone] After 3pm call: [phone]

## 2012-06-30 NOTE — Progress Notes (Signed)
CIR consult pending. Pt will need an OT evaluation in order to be considered for CIR by pt's insurance co. For questions call [phone]

## 2012-06-30 NOTE — Consult Note (Signed)
Physical Medicine and Rehabilitation Consult Reason for Consult: Conditioning status post cardiac arrest Referring Physician: Critical care   HPI: Matthew Juarez is a 52 y.o. right-handed male well known to rehabilitation services from admission January 2014 for cervicothoracic transverse myelitis(positive neuromyelitis optic antibody) as well as right distal fibula fracture. Patient received inpatient therapy from 04/20/2012 to 05/11/2012 and was discharged to home and was at a modified independent level transfers receiving home health therapies. Presented 06/24/2012 with cardiac arrest and received CPR for 17 minutes. Venous Doppler studies lower extremities consistent with DVT right popliteal vein, right posterior tibial vein, and right perineal vein. Patient did require intubation until 06/25/2012. Echocardiogram with ejection fraction of 70% no wall motion abnormalities. Placed on intravenous heparin for DVT suspect pulmonary emboli. Followup x-rays right foot for history of right distal fibula fracture was negative for acute fracture dislocation or bone destruction. Patient with residual lower tremor the weakness from pre-existing transverse myelitis. Recent chest x-ray with moderate posterior layering right effusion consolidation placed on Unasyn for question aspiration pneumonia. Physical therapy evaluation completed 06/26/2012 with recommendations of physical medicine rehabilitation consult to consider inpatient rehabilitation services.   Review of Systems  Musculoskeletal: Positive for back pain.  Neurological: Positive for tingling, weakness and headaches.       Syncope  All other systems reviewed and are negative.   Past Medical History  Diagnosis Date  . Chronic back pain   . Hypertension   . Gout   . Headache   . Arthritis    History reviewed. No pertinent past surgical history. Family History  Problem Relation Age of Onset  . Lung cancer Maternal Uncle   . Colon cancer  Maternal Aunt    Social History:  reports that he has never smoked. He does not have any smokeless tobacco history on file. He reports that  drinks alcohol. He reports that he does not use illicit drugs. Allergies: No Known Allergies Medications Prior to Admission  Medication Sig Dispense Refill  . allopurinol (ZYLOPRIM) 300 MG tablet Take 1 tablet (300 mg total) by mouth daily.  30 tablet  1  . amLODipine-olmesartan (AZOR) 5-20 MG per tablet Take 1 tablet by mouth daily.      . Cholecalciferol (VITAMIN D) 2000 UNITS CAPS Take 2,000 Units by mouth daily.      . Coenzyme Q10 (CO Q-10) 200 MG CAPS Take 1 capsule by mouth daily.  30 each  1  . esomeprazole (NEXIUM) 20 MG capsule Take 20 mg by mouth daily before breakfast.      . gabapentin (NEURONTIN) 300 MG capsule Take 600 mg by mouth 3 (three) times daily.      Marland Kitchen HYDROcodone-acetaminophen (NORCO/VICODIN) 5-325 MG per tablet Take 1-2 tablets by mouth every 4 (four) hours as needed for pain.        Home: Home Living Lives With: Spouse Available Help at Discharge: Family;Available 24 hours/day Type of Home: House Home Access: Ramped entrance Home Layout: One level Bathroom Shower/Tub: Engineer, manufacturing systems: Standard Home Adaptive Equipment: Bedside commode/3-in-1;Tub transfer bench;Walker - standard;Wheelchair - manual  Functional History: Prior Function Able to Take Stairs?: No Driving: No Comments: has to have "ankle strap" and his tennis shoe on to walk or stand, per pt. Functional Status:  Mobility: Bed Mobility Bed Mobility: Rolling Right;Right Sidelying to Sit;Sitting - Scoot to Edge of Bed;Sit to Supine Rolling Right: 1: +2 Total assist;With rail Rolling Right: Patient Percentage: 30% Right Sidelying to Sit: 1: +2 Total assist;With rails  Right Sidelying to Sit: Patient Percentage: 30% Sitting - Scoot to Edge of Bed: 1: +2 Total assist;With rail Sitting - Scoot to [place] of Bed: Patient Percentage: 30% Sit to  Supine: 1: +2 Total assist;HOB flat Sit to Supine: Patient Percentage: 20% Transfers Transfers: Not assessed      ADL:    Cognition: Cognition Arousal/Alertness: Awake/alert Orientation Level: Oriented X4 Cognition Overall Cognitive Status: Appears within functional limits for tasks assessed/performed Arousal/Alertness: Awake/alert Orientation Level: Appears intact for tasks assessed Behavior During Session: [facility] for tasks performed  Blood pressure 109/54, pulse 95, temperature 102.3 F (39.1 C), temperature source Oral, resp. rate 18, height 6\' 3"  (1.905 m), weight 136.261 kg (300 lb 6.4 oz), SpO2 94.00%. Physical Exam  Constitutional: He is oriented to person, place, and time.  52 year old obese male  HENT:  Head: Normocephalic.  Eyes: EOM are normal.  Neck: Neck supple. No thyromegaly present.  Cardiovascular: Normal rate and regular rhythm.   Pulmonary/Chest: Breath sounds normal. No respiratory distress.  Abdominal: Soft. Bowel sounds are normal. He exhibits no distension.  Musculoskeletal: He exhibits edema (bilateral LE 2+).  Neurological: He is alert and oriented to person, place, and time.  Follows full commands. Lower extremity weakness persistent but improved knee control and ankle DF and PF.  Sensory function 1 to 1+ in either leg  Skin: Skin is warm and dry.  Psychiatric: He has a normal mood and affect.    Results for orders placed during the hospital encounter of 06/23/12 (from the past 24 hour(s))  HEPARIN LEVEL (UNFRACTIONATED)     Status: Abnormal   Collection Time    06/30/12  5:05 AM      Result Value Range   Heparin Unfractionated 0.26 (*) 0.30 - 0.70 IU/mL  CBC     Status: Abnormal   Collection Time    06/30/12  5:05 AM      Result Value Range   WBC 8.8  4.0 - 10.5 K/uL   RBC 2.72 (*) 4.22 - 5.81 MIL/uL   Hemoglobin 7.9 (*) 13.0 - 17.0 g/dL   HCT 14.7 (*) 82.9 - 56.2 %   MCV 84.9  78.0 - 100.0 fL   MCH 29.0  26.0 - 34.0 pg   MCHC 34.2  30.0  - 36.0 g/dL   RDW 13.0  86.5 - 78.4 %   Platelets 203  150 - 400 K/uL  BASIC METABOLIC PANEL     Status: Abnormal   Collection Time    06/30/12  5:05 AM      Result Value Range   Sodium 141  135 - 145 mEq/L   Potassium 3.7  3.5 - 5.1 mEq/L   Chloride 106  96 - 112 mEq/L   CO2 27  19 - 32 mEq/L   Glucose, Bld 93  70 - 99 mg/dL   BUN 15  6 - 23 mg/dL   Creatinine, Ser 6.96  0.50 - 1.35 mg/dL   Calcium 8.7  8.4 - 29.5 mg/dL   GFR calc non Af Amer 62 (*) >90 mL/min   GFR calc Af Amer 72 (*) >90 mL/min  HEPARIN LEVEL (UNFRACTIONATED)     Status: Abnormal   Collection Time    06/30/12 11:30 AM      Result Value Range   Heparin Unfractionated 0.19 (*) 0.30 - 0.70 IU/mL   Dg Foot 2 Views Right  06/29/2012  *RADIOLOGY REPORT*  Clinical Data: Pain at right lateral malleolus, history of foot fracture January  2014, fell again 1 week ago  RIGHT FOOT - 2 VIEW  Comparison: None  Findings: Mild diffuse osseous demineralization. Minimal hallux valgus. Joint spaces preserved. Small plantar calcaneal spur. Soft tissue swelling at dorsum of right foot. No acute fracture, dislocation, or bone destruction.  IMPRESSION: No acute osseous abnormalities.   Original Report Authenticated By: Ulyses Southward, M.D.     Assessment/Plan: Diagnosis: hx of transverse myelitis, recent PE 1. Does the need for close, 24 hr/day medical supervision in concert with the patient's rehab needs make it unreasonable for this patient to be served in a less intensive setting? No 2. Co-Morbidities requiring supervision/potential complications: see above 3. Due to bladder management, bowel management, safety and skin/wound care, does the patient require 24 hr/day rehab nursing? No 4. Does the patient require coordinated care of a physician, rehab nurse, PT,OT to address physical and functional deficits in the context of the above medical diagnosis(es)? No Addressing deficits in the following areas: balance, endurance, strength,  transferring and bathing 5. Can the patient actively participate in an intensive therapy program of at least 3 hrs of therapy per day at least 5 days per week? Potentially 6. The potential for patient to make measurable gains while on inpatient rehab is fair 7. Anticipated functional outcomes upon discharge from inpatient rehab are n/a with PT, n/a with OT, n/a with SLP. 8. Estimated rehab length of stay to reach the above functional goals is: n/a 9. Does the patient have adequate social supports to accommodate these discharge functional goals? Potentially 10. Anticipated D/C setting: Home 11. Anticipated post D/C treatments: HH therapy 12. Overall Rehab/Functional Prognosis: good  RECOMMENDATIONS: This patient's condition is appropriate for continued rehabilitative care in the following setting: [facility] Patient has agreed to participate in recommended program. Yes Note that insurance prior authorization may be required for reimbursement for recommended care.  Comment: Pt was with [place] previously on inpatient rehab due to his transverse myelitis. He failed to follow up with me in the office last month. Admitted for PE. He actually has improved neurologically from when I last examined him. Can go home with [facility] therapies once medically ready for dc.  Ranelle Oyster, MD, [place] [facility]     06/30/2012

## 2012-06-30 NOTE — Consult Note (Addendum)
ANTICOAGULATION CONSULT NOTE - Follow Up Consult  Pharmacy Consult for Change UFH to Lovenox ;  Coumadin Indication: pulmonary embolus and DVT  Lovenox Dosing Weight: 136 kg  Labs:  Recent Labs  06/28/12 0500 06/28/12 1810  06/29/12 0520 06/29/12 1340 06/30/12 0505 06/30/12 1130  HGB 7.5* 8.2*  --  7.7*  --  7.9*  --   HCT 21.5* 23.4*  --  22.9*  --  23.1*  --   PLT 139* 178  --  188  --  203  --   APTT 29  --   --   --   --   --   --   LABPROT 14.0  --   --   --   --   --   --   INR 1.09  --   --   --   --   --   --   HEPARINUNFRC  --   --   < > <0.10* 0.21* 0.26* 0.19*  CREATININE 1.25  --   --  1.25  --  1.29  --    Lab Results  Component Value Date   INR 1.09 06/28/2012   INR 1.13 06/27/2012   INR 1.77* 06/23/2012   Medications:  Scheduled:  . amLODipine  5 mg Oral Daily  . [EXPIRED] ampicillin-sulbactam (UNASYN) IV  3 g Intravenous Q6H  . gabapentin  600 mg Oral TID  . irbesartan  150 mg Oral Daily  . polyethylene glycol  17 g Oral Daily  . sodium chloride  1,000 mL Intravenous Once  . [DISCONTINUED] amLODipine-olmesartan  1 tablet Oral Daily   Infusions:  . sodium chloride Stopped (06/26/12 1500)  . [DISCONTINUED] sodium chloride 10 mL/hr at 06/27/12 1500  . [DISCONTINUED] heparin 2,800 Units/hr (06/30/12 1408)   Assessment:  51 yom admitted after PEA arrest presumably caused by PE.  Dopplers + for DVT.  Patient has been on IV Heparin infusion for VTE treatment.  Heparin to be changed from UFH to Lovenox.  Coumadin is to added beginning today.  Previous INR reported as 1.77 [unknown reason for elevation].  Current INR pending.  H/H/P have remained stable on UFH.   Goal of Therapy:  Target INR 2-3   4 hour LMWH level 0.6 - 1.2 units/ml   Plan:  Follow up INR, then dose Coumadin as indicated.  Stop UFH, then 1 hour later begin Lovenox 135 mg sq q 12 hours. Daily INR's, CBC.   Daveyon Kitchings, Deetta Perla.D 06/30/2012, 3:41 PM   = = =  06/30/2012 ,  4:28 PM   Coumadin dosing per Pharmacy.  Patient has many risk factors for clotting.  Total Coumadin predictor score = 11. INR today 1.08.  Hgb 7.9, Hct 23.1.  PLAN:  Coumadin 10 mg today. Daily INR's, CBC.   Osmar Howton, Elisha Headland,  Pharm.D. .

## 2012-07-01 ENCOUNTER — Inpatient Hospital Stay (HOSPITAL_COMMUNITY): Payer: BC Managed Care – PPO

## 2012-07-01 DIAGNOSIS — G8929 Other chronic pain: Secondary | ICD-10-CM

## 2012-07-01 DIAGNOSIS — G373 Acute transverse myelitis in demyelinating disease of central nervous system: Secondary | ICD-10-CM

## 2012-07-01 DIAGNOSIS — I2699 Other pulmonary embolism without acute cor pulmonale: Secondary | ICD-10-CM

## 2012-07-01 DIAGNOSIS — R5381 Other malaise: Secondary | ICD-10-CM

## 2012-07-01 DIAGNOSIS — J90 Pleural effusion, not elsewhere classified: Secondary | ICD-10-CM

## 2012-07-01 DIAGNOSIS — M549 Dorsalgia, unspecified: Secondary | ICD-10-CM

## 2012-07-01 LAB — GLUCOSE, SEROUS FLUID: Glucose, Fluid: 85 mg/dL

## 2012-07-01 LAB — BODY FLUID CELL COUNT WITH DIFFERENTIAL
Lymphs, Fluid: 28 %
Total Nucleated Cell Count, Fluid: 275 cu mm (ref 0–1000)

## 2012-07-01 LAB — PROTEIN, BODY FLUID: Total protein, fluid: 2.7 g/dL

## 2012-07-01 LAB — PROTIME-INR
INR: 1.14 (ref 0.00–1.49)
Prothrombin Time: 14.4 seconds (ref 11.6–15.2)

## 2012-07-01 LAB — CBC
Hemoglobin: 7.7 g/dL — ABNORMAL LOW (ref 13.0–17.0)
RBC: 2.67 MIL/uL — ABNORMAL LOW (ref 4.22–5.81)
WBC: 7.4 10*3/uL (ref 4.0–10.5)

## 2012-07-01 MED ORDER — FUROSEMIDE 10 MG/ML IJ SOLN
40.0000 mg | Freq: Two times a day (BID) | INTRAMUSCULAR | Status: DC
  Administered 2012-07-01 – 2012-07-03 (×5): 40 mg via INTRAVENOUS
  Filled 2012-07-01 (×7): qty 4

## 2012-07-01 MED ORDER — POTASSIUM CHLORIDE CRYS ER 20 MEQ PO TBCR
30.0000 meq | EXTENDED_RELEASE_TABLET | Freq: Two times a day (BID) | ORAL | Status: DC
  Administered 2012-07-01 – 2012-07-05 (×9): 30 meq via ORAL
  Filled 2012-07-01 (×10): qty 1

## 2012-07-01 MED ORDER — WARFARIN VIDEO
1.0000 | Freq: Once | Status: AC
  Administered 2012-07-02: 1

## 2012-07-01 MED ORDER — COUMADIN BOOK
1.0000 | Freq: Once | Status: AC
  Administered 2012-07-01: 1
  Filled 2012-07-01: qty 1

## 2012-07-01 MED ORDER — WARFARIN SODIUM 10 MG PO TABS
10.0000 mg | ORAL_TABLET | Freq: Once | ORAL | Status: AC
  Administered 2012-07-01: 10 mg via ORAL
  Filled 2012-07-01: qty 1

## 2012-07-01 MED ORDER — SODIUM CHLORIDE 0.9 % IJ SOLN
10.0000 mL | INTRAMUSCULAR | Status: DC | PRN
  Administered 2012-07-01: 20 mL
  Administered 2012-07-01 – 2012-07-02 (×3): 10 mL
  Administered 2012-07-03: 20 mL
  Administered 2012-07-04 – 2012-07-05 (×2): 10 mL
  Administered 2012-07-05 – 2012-07-06 (×2): 20 mL

## 2012-07-01 NOTE — Progress Notes (Signed)
ANTICOAGULATION CONSULT NOTE - Follow Up Consult  Pharmacy Consult for Coumadin with Lovenox bridging  Indication: pulmonary embolus and DVT  Lovenox Dosing Weight: 139 kg  Labs:  Recent Labs  06/29/12 0520 06/29/12 1340 06/30/12 0505 06/30/12 1130 07/01/12 0520  HGB 7.7*  --  7.9*  --  7.7*  HCT 22.9*  --  23.1*  --  22.8*  PLT 188  --  203  --  229  LABPROT  --   --   --  13.9 14.4  INR  --   --   --  1.08 1.14  HEPARINUNFRC <0.10* 0.21* 0.26* 0.19*  --   CREATININE 1.25  --  1.29  --   --    Lab Results  Component Value Date   INR 1.14 07/01/2012   INR 1.08 06/30/2012   INR 1.09 06/28/2012   Medications:  Scheduled:  . amLODipine  5 mg Oral Daily  . enoxaparin (LOVENOX) injection  1 mg/kg Subcutaneous Q12H  . furosemide  40 mg Intravenous BID  . gabapentin  600 mg Oral TID  . irbesartan  150 mg Oral Daily  . polyethylene glycol  17 g Oral Daily  . potassium chloride  30 mEq Oral Q12H  . sodium chloride  1,000 mL Intravenous Once  . [COMPLETED] warfarin  10 mg Oral ONCE-1800  . Warfarin - Pharmacist Dosing Inpatient   Does not apply q1800    Assessment:  56 yom admitted after PEA arrest presumably caused by PE. Dopplers + for DVT.  Patient started on chronic anticoagulation with Coumadin and Lovenox bridging.  INR 1.14.  H/H/P stable with No bleeding complications noted   Goal of Therapy:  Target INR 2-3    Plan:  Continue full dose Lovenox bridging 135 mg sq q 12 hours.  Will plan Lovenox minimum of 5 days and INR therapeutic [>= 2] for minimum of 24 hours prior to discontinuing.  Repeat Coumadin 10 mg po today.  Daily INR's, CBC.    Seyed Heffley, Deetta Perla.D 07/01/2012, 12:40 PM

## 2012-07-01 NOTE — Progress Notes (Signed)
PT Note to update DC plans  CIR rejected pt for admission.  Per recent PT progress note pt requiring +2 total assist for all mobility.  Since pt not accepted for CIR recommend ST-SNF.  [employer] PT [phone]

## 2012-07-01 NOTE — Procedures (Signed)
Thoracentesis Procedure Note  Pre-operative Diagnosis: Right pleural effusion  Post-operative Diagnosis: same  Indications: Right pleural effusion  Procedure Details  Consent: Informed consent was obtained. Risks of the procedure were discussed including: infection, bleeding, pain, pneumothorax.  Under sterile conditions the patient was positioned. Betadine solution and sterile drapes were utilized.  1% plain lidocaine was used to anesthetize the 6 rib space. Fluid was obtained without any difficulties and minimal blood loss.  A dressing was applied to the wound and wound care instructions were provided.   Findings 1500 ml of bloody pleural fluid was obtained. A sample was sent to Pathology for cytogenetics, flow, and cell counts, as well as for infection analysis.  Complications:  None; patient tolerated the procedure well.          Condition: stable  Plan A follow up chest x-ray was ordered. Will send fluid for glucose, protein, LDH, cell count with differential, hematocrit, gram stain and culture, and cytology.  Performed by Anders Simmonds, ACNP.  Attending Attestation: I was present for the entire procedure.   Coralyn Helling, MD [facility] Pulmonary/Critical Care 07/01/2012, 3:59 PM Pager:  [phone] After 3pm call: [phone]

## 2012-07-01 NOTE — Progress Notes (Signed)
PULMONARY  / CRITICAL CARE MEDICINE  Name: Matthew Juarez MRN: [id number] DOB: 1960-11-15    ADMISSION DATE:  06/23/2012 CONSULTATION DATE: 06/23/2012  REFERRING MD :  EDP  CHIEF COMPLAINT:  Cardiac arrest  BRIEF PATIENT DESCRIPTION: 52 y/o recently admitted with cervicothoracic transverse myelitis, R distal femur fracture and R DVT admitted after suffering PEA arrest  Secondary to suspected PE.  SIGNIFICANT EVENTS / STUDIES:  3/26  Admitted after PEA arrest, PE suspected 3/26  Venous Doppler >>> POSITIVE >>> Findings consistent with deep vein thrombosis involving the right popliteal vein, right posterial tibial vein, and right peroneal vein. 3/26  2D echo >>> POSITIVE >>> EF 70%, severe RV dilation / disfunction  LINES / TUBES: OETT 3/26 >>>3-29 OGT 3/26 >>>3-28 R IJ TLC 3/26  >>> R fem A line 3/26 >>>3-28  CULTURES: 3/26 Blood 1/2 >>> coag negative staph 3/26 Urine >>> negative 3/26 Respiratory >>> MSSA 3/26 MRSA screen >> negative  ANTIBIOTICS: unasyn 3/26 >>  vanco 3/27 >> 3/30  SUBJECTIVE:   He is feeling better, breathing well. No CP. No new complaints.   VITAL SIGNS: Temp:  [97.3 F (36.3 C)-102.3 F (39.1 C)] 98.2 F (36.8 C) (04/03 0414) Pulse Rate:  [67-95] 67 (04/03 0414) Resp:  [15-18] 16 (04/03 0414) BP: (109-142)/(54-73) 116/73 mmHg (04/03 0414) SpO2:  [94 %-99 %] 97 % (04/03 0414) Weight:  [139.4 kg (307 lb 5.1 oz)] 139.4 kg (307 lb 5.1 oz) (04/03 0414) Room air      INTAKE / OUTPUT: Intake/Output     04/02 0701 - 04/03 0700 04/03 0701 - 04/04 0700   P.O.     I.V. (mL/kg)  20 (0.1)   IV Piggyback     Total Intake(mL/kg)  20 (0.1)   Urine (mL/kg/hr) 3875 (1.2)    Total Output 3875     Net -3875 +20          PHYSICAL EXAMINATION: General:  Well appearing male in a NAD Neuro:  Awake and alert, preexisting le weakness. Follows commands and responds appropriately.  HEENT:  PERRL,  Cardiovascular:  RRR, no murmurs Lungs:  Clear bilaterally.   Decreased R>L Abdomen:  Obese, soft, bowel sounds present Foley: hematuria resolved Musculoskeletal:  Trace edema, lower ext muscle wasting Skin:  intact  LABS:  Recent Labs Lab 06/28/12 0500 06/29/12 0520 06/30/12 0505  NA 140 142 141  K 3.3* 3.7 3.7  CL 105 107 106  CO2 27 26 27   BUN 14 14 15   CREATININE 1.25 1.25 1.29  GLUCOSE 103* 94 93    Recent Labs Lab 06/29/12 0520 06/30/12 0505 07/01/12 0520  HGB 7.7* 7.9* 7.7*  HCT 22.9* 23.1* 22.8*  WBC 9.1 8.8 7.4  PLT 188 203 229   Lab Results  Component Value Date   INR 1.14 07/01/2012   INR 1.08 06/30/2012   INR 1.09 06/28/2012    Recent Labs Lab 06/24/12 2330 06/25/12 0424 06/25/12 0754 06/25/12 1312 06/25/12 1631  GLUCAP 110* 126* 111* 105* 107*  imaging: R>L effusion/atx, aeration a little worse on left.  Resolved issues Acute resp failure in setting of PE PEA cardiac arrest in setting of suspected PE.  Shock, cardiogenic, improved Hypokalemia (resolved) Relative adrenal insuff. (now off steroids) Suspected aspiration pneumonia: Abx / Cx as above, plan 7 day course >> d/c 4/3 ASSESSMENT / PLAN:  PE/DVT: presume propagated by immobility.  Anemia, thrombocytopenia -2 gm drop hemoglobin with hematuria -Hematuria resolved 3/31 Some of the anemia may be hemoconcentration  P:  -Trend CBC -Changed heparin to lovenox 4/2 - committed to anticoagulation for 6-12 - cont warfarin   R>L effusions/atx P: Escalated lasix  Bedside US eval effusion (especially on right) shows large R effusion..will go ahead w/ diagnostic therapeutic thoracentesis later today (4/3)  H/o HTN P:  See mar  AKI. -->resolving  P:   Follow BMP   Congestive hepatopathy in setting of shock. P:   Trend LFTs Protonix for DVT Px  Acute encephalopathy.  Hx of transverse myelitis. Resolved 3-29 Wide awake. Residual lower ext weakness from preexisting transverse myelitis.  P:   -monitor - restart neurontin   Deconditioning   P: PT/OT consult, possible CIR  TODAY'S SUMMARY: 52 y/o recently admitted with cervicothoracic transverse myelitis, R distal femur fracture and R DVT admitted after suffering PEA arrest  Secondary to suspected PE. Venous Doppler suggestive of R DVT. 2D echo reports severe RV dysfunction / dilation. CTA deferred - AKI.  Emperical Heparin > restarted 3/31, follow CBC and for more hematuria. Transfer to floor 4/1  Patient seen and examined, agree with above note.  I dictated the care and orders written for this patient under my direction.  Simonne Martinet, NP  Reviewed above, examined pt, and agree with assessment/plan.  He has moderate effusion on Rt.  Will proceed with thoracentesis and send for analysis.  Coralyn Helling, MD [facility] Pulmonary/Critical Care 07/01/2012, 2:14 PM Pager:  [phone] After 3pm call: [phone]

## 2012-07-02 ENCOUNTER — Inpatient Hospital Stay (HOSPITAL_COMMUNITY): Payer: BC Managed Care – PPO

## 2012-07-02 LAB — CHOLESTEROL, TOTAL: Cholesterol: 161 mg/dL (ref 0–200)

## 2012-07-02 LAB — CBC
Platelets: 235 10*3/uL (ref 150–400)
RBC: 2.73 MIL/uL — ABNORMAL LOW (ref 4.22–5.81)
WBC: 7.4 10*3/uL (ref 4.0–10.5)

## 2012-07-02 LAB — BASIC METABOLIC PANEL
CO2: 29 mEq/L (ref 19–32)
Calcium: 8.7 mg/dL (ref 8.4–10.5)
GFR calc non Af Amer: 63 mL/min — ABNORMAL LOW (ref >90)
Sodium: 139 mEq/L (ref 135–145)

## 2012-07-02 LAB — LACTATE DEHYDROGENASE: LDH: 331 U/L — ABNORMAL HIGH (ref 94–250)

## 2012-07-02 LAB — CHOLESTEROL, BODY FLUID: Cholesterol, Fluid: 72 mg/dL

## 2012-07-02 LAB — PROTIME-INR: INR: 1.16 (ref 0.00–1.49)

## 2012-07-02 MED ORDER — WARFARIN SODIUM 7.5 MG PO TABS
15.0000 mg | ORAL_TABLET | Freq: Once | ORAL | Status: AC
  Administered 2012-07-02: 15 mg via ORAL
  Filled 2012-07-02: qty 2

## 2012-07-02 MED ORDER — NONFORMULARY OR COMPOUNDED ITEM: 2.0000 | Freq: Every day | Status: DC

## 2012-07-02 NOTE — Progress Notes (Signed)
PULMONARY  / CRITICAL CARE MEDICINE  Name: Matthew Juarez MRN: [id number] DOB: 1960-06-04    ADMISSION DATE:  06/23/2012 CONSULTATION DATE: 06/23/2012  REFERRING MD :  EDP  CHIEF COMPLAINT:  Cardiac arrest  BRIEF PATIENT DESCRIPTION: 52 y/o recently admitted with cervicothoracic transverse myelitis, R distal femur fracture and R DVT admitted after suffering PEA arrest  Secondary to suspected PE.  SIGNIFICANT EVENTS / STUDIES:  3/26 - Admitted after PEA arrest, PE suspected 3/26 - Venous Doppler >>> POSITIVE >>> Findings consistent with deep vein thrombosis involving the right popliteal vein, right posterial tibial vein, and right peroneal vein. 3/26 - 2D echo >>> POSITIVE >>> EF 70%, severe RV dilation / disfunction 4/03 - Thoracentesis with 1500 ml bloody fluid removed, exudative effusion 4/04 - CXR with atx, hgb stable  LINES / TUBES: OETT 3/26 >>>3-29 OGT 3/26 >>>3-28 R IJ TLC 3/26  >>> R fem A line 3/26 >>>3-28  CULTURES: 3/26 Blood 1/2 >>> coag negative staph 3/26 Urine >>> negative 3/26 Respiratory >>> MSSA 3/26 MRSA screen >> negative  ANTIBIOTICS: unasyn 3/26 >>4/2 vanco 3/27 >> 3/30  SUBJECTIVE:  Pt reports no acute events. Stood up with PT.  Denies hemoptysis, shortness of breath, chest pain.    VITAL SIGNS: Temp:  [97.7 F (36.5 C)-99.6 F (37.6 C)] 97.7 F (36.5 C) (04/04 0444) Pulse Rate:  [71-82] 71 (04/04 0444) Resp:  [16-17] 16 (04/04 0444) BP: (93-126)/(52-64) 115/52 mmHg (04/04 0444) SpO2:  [98 %] 98 % (04/04 0444) Weight:  [293 lb 3.4 oz (133 kg)] 293 lb 3.4 oz (133 kg) (04/04 0444) 2L O2    INTAKE / OUTPUT: Intake/Output     04/03 0701 - 04/04 0700 04/04 0701 - 04/05 0700   P.O. 1070 240   I.V. (mL/kg) 73.7 (0.6)    Total Intake(mL/kg) 1143.7 (8.6) 240 (1.8)   Urine (mL/kg/hr) 7450 (2.3) 2500 (5.2)   Total Output 7450 2500   Net -6306.3 -2260          PHYSICAL EXAMINATION: General:  Well appearing male in a NAD Neuro:  Awake and  alert, preexisting LE weakness. Follows commands and responds appropriately.  HEENT:  PERRL,  Cardiovascular:  RRR, no murmurs Lungs:  Clear bilaterally.  Decreased R>L Abdomen:  Obese, soft, bowel sounds present GU: hematuria resolved Musculoskeletal:  Trace edema, lower ext muscle wasting Skin:  intact  LABS:  Recent Labs Lab 06/29/12 0520 06/30/12 0505 07/02/12 0500  NA 142 141 139  K 3.7 3.7 3.7  CL 107 106 103  CO2 26 27 29   BUN 14 15 15   CREATININE 1.25 1.29 1.27  GLUCOSE 94 93 86    Recent Labs Lab 06/30/12 0505 07/01/12 0520 07/02/12 0500  HGB 7.9* 7.7* 8.1*  HCT 23.1* 22.8* 23.5*  WBC 8.8 7.4 7.4  PLT 203 229 235   Lab Results  Component Value Date   INR 1.16 07/02/2012   INR 1.14 07/01/2012   INR 1.08 06/30/2012    Recent Labs Lab 06/25/12 1312 06/25/12 1631  GLUCAP 105* 107*   Imaging: 4/4 R>L effusion/atx, effusion smaller but ? Increase from 4/3 vs atx    Resolved issues Acute resp failure in setting of PE PEA cardiac arrest in setting of suspected PE.  Shock, cardiogenic, improved Hypokalemia (resolved) Relative adrenal insuff. (now off steroids) Suspected aspiration pneumonia: Abx / Cx as above, plan 7 day course >> d/c 4/3   ASSESSMENT / PLAN:  PE/DVT: presume propagated by immobility.  HX OF RLE  DVT prior to admit.  Anemia, thrombocytopenia -2 gm drop hemoglobin with hematuria.  Hematuria resolved 3/31.  Some of the anemia may be hemoconcentration    P:  -Trend CBC -Changed heparin to lovenox 4/2 -committed to anticoagulation for 6-12 months -->?life given 2nd episode of clotting (RLE DVT prior to admit) -cont warfarin   R>L effusions/atx - s/p thoracentesis 4/3 with 1500 ml bloody fluid removed  P: -continue increased dose lasix, consider tx to oral 4/5  -OT working with edema in LE -faxed script for LE compression stockings to [facility] 4/4 -assess for O2 needs prior to d/c  H/o HTN  P:  -Continue current  medications  AKI - resolving.  Hematuria - resolved.   P:   -Follow BMP  -Discontinue Foley cath  Congestive hepatopathy in setting of shock.  P:   Trend LFTs Protonix for DVT Px  Acute encephalopathy.  Hx of transverse myelitis. Resolved 3-29 Wide awake. Residual lower ext weakness from preexisting transverse myelitis.   P:   -monitor -continue neurontin   Deconditioning   P: -PT/OT consult, declined for CIR.  Search for SNF for Rehab.   -arrange for home PT, OT, RN, aide.  RN will need to draw home INR and send results to Dr. Renae Gloss. -hold d/c of central line for lab draws (difficult stick)  TODAY'S SUMMARY: 52 y/o recently admitted with cervicothoracic transverse myelitis, R distal femur fracture and R DVT admitted after suffering PEA arrest  Secondary to suspected PE. Venous Doppler suggestive of R DVT. 2D echo reports severe RV dysfunction / dilation. CTA deferred - AKI.  Emperic Heparin > restarted 3/31, follow CBC and for more hematuria. Transfer to floor 4/1.  4/3 R effusion tap with 1500 ml bloody fluid removed.  Declined by CIR.  Family & PT feels he can go home when medically ready as he has learned 'rehab' techniques with previous transverse myelitis.       Canary Brim, NP-C [facility] Pgr: [phone] or [phone]  Patient seen and examined, agree with above note.  I dictated the care and orders written for this patient under my direction.  Alyson Reedy, MD [phone]

## 2012-07-02 NOTE — Progress Notes (Signed)
Diarrhea;   Denies abdominal pain; frequent loose stools, every hour, yellow, no hematochezia or melena; no nausea and vomiting.   Medications reviewed and currently not on stool softeners;   Plan: obtain C. Diff; if negative, consider imodium.

## 2012-07-02 NOTE — Progress Notes (Signed)
eLink Physician-Brief Progress Note Patient Name: Matthew Juarez DOB: [birth year] MRN: [id number]  Date of Service  07/02/2012   HPI/Events of Note   Thoracentesis reveals exudate of unclear etiology with normal glucose. Post thoracentesis chest x-ray reviewed and patient does not have pneumothorax   eICU Interventions   bedside pulmonary attending who further evaluation    Intervention Category Intermediate Interventions: Diagnostic test evaluation  Travell Desaulniers 07/02/2012, 12:15 AM

## 2012-07-02 NOTE — Progress Notes (Signed)
Physical Therapy Treatment Patient Details Name: Matthew Juarez MRN: [id number] DOB: [birth year] Today's Date: 07/02/2012 Time: 1405-1430 PT Time Calculation (min): 25 min  PT Assessment / Plan / Recommendation Comments on Treatment Session  Pt was able to do sliding board transfer with min assist. He and wife feel they will be able to manage at home. Wife got compression hose from outside hospital, but they are too small to be place on pt's legs.  He would benefit from lower compression TED hose to start and possible referral for lymphedema treatment from home or eventually as and outpatient if edema persisits    Follow Up Recommendations  Home health PT     Does the patient have the potential to tolerate intense rehabilitation     Barriers to Discharge        Equipment Recommendations  None recommended by PT    Recommendations for Other Services    Frequency Min 3X/week   Plan Discharge plan remains appropriate;Frequency remains appropriate    Precautions / Restrictions Precautions Precautions: Fall Required Braces or Orthoses: Other Brace/Splint Other Brace/Splint: ankle brace on R Restrictions Weight Bearing Restrictions: No RLE Weight Bearing: Weight bearing as tolerated Other Position/Activity Restrictions: No orders for any weight bearing restrictions   Pertinent Vitals/Pain No c/o pain    Mobility  Bed Mobility Bed Mobility: Supine to Sit;Sit to Supine Rolling Right: 5: Supervision;With rail Right Sidelying to Sit: 4: Min assist;HOB flat;With rails Supine to Sit: 4: Min assist (to move legs to edge of bed) Sitting - Scoot to Edge of Bed: 4: Min assist Sit to Supine: 4: Min assist Sit to Supine: Patient Percentage: 50% Details for Bed Mobility Assistance: pt needed assist to lift legs up onto bed Transfers Transfers: Lateral/Scoot Transfers Sit to Stand: 1: +2 Total assist;With upper extremity assist;From bed;From elevated surface Sit to Stand: Patient Percentage:  60% Stand to Sit: 1: +2 Total assist;To elevated surface;To bed;With upper extremity assist Stand to Sit: Patient Percentage: 60% Lateral/Scoot Transfers: With slide board;3: Mod assist Transfer via Lift Equipment: Huntley Dec Lift (Pt would benefit from use of sara if he was to get into chai) Details for Transfer Assistance: Pt able to direct use of slide board with technique he learned on CIR.  Some assist needed to help guide scoot (we used the pad he was sitting on to slide on board covered in pillow case to decrease friction.  Pt transferred out of bed toward left side and back into bed toward right side Ambulation/Gait Ambulation/Gait Assistance: Not tested (comment)    Exercises     PT Diagnosis:    PT Problem List:   PT Treatment Interventions:     PT Goals Acute Rehab PT Goals PT Goal: Sit at Edge Of Bed - Progress: Progressing toward goal PT Goal: Sit to Stand - Progress: Progressing toward goal PT Goal: Stand to Sit - Progress: Progressing toward goal PT Transfer Goal: Bed to Chair/Chair to Bed - Progress: Met  Visit Information  Last PT Received On: 07/02/12 Assistance Needed: +2    Subjective Data  Subjective: 'It feels good to sit up" Patient Stated Goal: to do sliding board transfers to  get home   Cognition  Cognition Overall Cognitive Status: Appears within functional limits for tasks assessed/performed Arousal/Alertness: Awake/alert Orientation Level: Oriented X4 / Intact Behavior During Session: [facility] for tasks performed    Balance  Static Sitting Balance Static Sitting - Balance Support: No upper extremity supported;Left upper extremity supported Static Standing Balance Static  Standing - Balance Support: Right upper extremity supported Static Standing - Level of Assistance: 7: Independent Static Standing - Comment/# of Minutes: no balance loss with transfer  End of Session PT - End of Session Equipment Utilized During Treatment: Gait belt Activity Tolerance:  Patient tolerated treatment well Patient left: in bed;with family/visitor present Nurse Communication: Mobility status   GP    Rosey Bath K. [place], [place] [phone] 07/02/2012, 2:47 PM

## 2012-07-02 NOTE — Progress Notes (Signed)
Physical Therapy Treatment Patient Details Name: Matthew Juarez MRN: [id number] DOB: 1960-12-07 Today's Date: 07/02/2012 Time: 9562-1308 PT Time Calculation (min): 19 min  PT Assessment / Plan / Recommendation Comments on Treatment Session  Pt is progressing; Pt has increased edema in RLE today; wife is pursuing compression hose per MD suggestion. Discussed d/c plan with patient and OT.  Pt was at sliding board transfer level when d/c'd from rehab last month and pt and wife feel they would be able to be at that level again.  If so, pt may be able to return to home with HHPT/OT as prior to admission.Will bring sliding board and attempt transfer this pm     Follow Up Recommendations  Home health PT     Does the patient have the potential to tolerate intense rehabilitation     Barriers to Discharge        Equipment Recommendations  None recommended by PT    Recommendations for Other Services    Frequency     Plan Discharge plan needs to be updated;Frequency remains appropriate    Precautions / Restrictions Precautions Precautions: Fall Required Braces or Orthoses: Other Brace/Splint Other Brace/Splint: ankle brace on R Restrictions Weight Bearing Restrictions: No RLE Weight Bearing: Weight bearing as tolerated Other Position/Activity Restrictions: No orders for any weight bearing restrictions   Pertinent Vitals/Pain No c/o pain    Mobility  Bed Mobility Bed Mobility: Rolling Right;Right Sidelying to Sit;Sitting - Scoot to Edge of Bed Rolling Right: 5: Supervision;With rail Right Sidelying to Sit: 4: Min assist;HOB flat;With rails Sitting - Scoot to Edge of Bed: 3: Mod assist Sit to Supine: 1: +2 Total assist Sit to Supine: Patient Percentage: 50% Details for Bed Mobility Assistance: pt needed assist to lift legs up onto bed Transfers Transfers: Sit to Stand;Stand to Sit Sit to Stand: 1: +2 Total assist;With upper extremity assist;From bed;From elevated surface Sit to Stand:  Patient Percentage: 60% Stand to Sit: 1: +2 Total assist;To elevated surface;To bed;With upper extremity assist Stand to Sit: Patient Percentage: 60% Transfer via Lift Equipment: Huntley Dec Lift (Pt would benefit from use of sara if he was to get into chai) Details for Transfer Assistance: cues for use of UEs to self assist;  assist to block R knee and bring wt over LEs.  Pt with verbal cues to activate glutes and bring chest up high.  Repeated sit to stand x2 with better performance on second attempt    Exercises     PT Diagnosis:    PT Problem List:   PT Treatment Interventions:     PT Goals Acute Rehab PT Goals PT Goal: Sit at Edge Of Bed - Progress: Progressing toward goal PT Goal: Sit to Stand - Progress: Progressing toward goal PT Goal: Stand to Sit - Progress: Progressing toward goal  Visit Information  Last PT Received On: 07/02/12 Assistance Needed: +2    Subjective Data  Subjective: pt and wife feel they will be able to manage at home Patient Stated Goal: to get  HHPT then go to outpt PT   Cognition  Cognition Overall Cognitive Status: Appears within functional limits for tasks assessed/performed Arousal/Alertness: Awake/alert Orientation Level: Oriented X4 / Intact Behavior During Session: [facility] for tasks performed    Balance     End of Session PT - End of Session Equipment Utilized During Treatment: Gait belt Activity Tolerance: Patient tolerated treatment well Patient left: in bed;with family/visitor present Nurse Communication: Mobility status   GP  Bayard Hugger. [place], [place] [phone] 07/02/2012, 11:28 AM

## 2012-07-02 NOTE — Evaluation (Signed)
Occupational Therapy Evaluation Patient Details Name: Matthew Juarez MRN: [id number] DOB: 09-27-1960 Today's Date: 07/02/2012 Time: 4098-1191 OT Time Calculation (min): 47 min  OT Assessment / Plan / Recommendation Clinical Impression  Pt is a 52 yo male admitted for cardiac arrest from possible PE after being home from rehab unit with transverse myelitis.  Pt has the deficits listed below and would benefit from cont OT at this time to reach a level he can d/c back home with his wife.    OT Assessment  Patient needs continued OT Services    Follow Up Recommendations  Home health OT;Supervision/Assistance - 24 hour;Other (comment) (if rehab wont take, rec HHOT using slide board for transfers)    Barriers to Discharge None wife available 24/7.  Equipment Recommendations  None recommended by OT    Recommendations for Other Services Rehab consult (pt at rehab prior to this admit, good candidate to return.)  Frequency  Min 3X/week    Precautions / Restrictions Precautions Precautions: Fall Required Braces or Orthoses: Other Brace/Splint Other Brace/Splint: ankle brace on R Restrictions Weight Bearing Restrictions: No RLE Weight Bearing: Weight bearing as tolerated Other Position/Activity Restrictions: No orders for any weight bearing restrictions   Pertinent Vitals/Pain Pt with 4/10 pain in ankle.    ADL  Eating/Feeding: Performed;Independent Where Assessed - Eating/Feeding: Edge of bed Grooming: Simulated;Set up Where Assessed - Grooming: Unsupported sitting Upper Body Bathing: Simulated;Set up Where Assessed - Upper Body Bathing: Unsupported sitting Lower Body Bathing: Simulated;Moderate assistance Where Assessed - Lower Body Bathing: Supported sit to stand Upper Body Dressing: Simulated;Set up Where Assessed - Upper Body Dressing: Unsupported sitting Lower Body Dressing: Performed;Moderate assistance Where Assessed - Lower Body Dressing: Supported sit to stand Equipment  Used: Agricultural consultant Transfers/Ambulation Related to ADLs: pt able to transfer sit to stand x2 with total assist x2 (pt 60%) for LE adls.   ADL Comments: pt with most difficulty doing lE adls b/c pt needs 2 people assist to be able to stand.  Pt excellent at techniques to cross legs to access feet for LE dressing.  Will address slide board transfers to the commode.    OT Diagnosis: Generalized weakness  OT Problem List: Decreased strength;Decreased range of motion;Impaired balance (sitting and/or standing);Decreased knowledge of use of DME or AE;Pain;Increased edema OT Treatment Interventions: Self-care/ADL training;DME and/or AE instruction;Therapeutic activities   OT Goals Acute Rehab OT Goals OT Goal Formulation: With patient/family Time For Goal Achievement: 07/16/12 Potential to Achieve Goals: Good ADL Goals Pt Will Perform Lower Body Bathing: with min assist;Sit to stand from chair;Standing at sink;Sitting at sink (min assist only for sit to stand part of dressing) ADL Goal: Lower Body Bathing - Progress: Goal set today Pt Will Perform Lower Body Dressing: with min assist;Sit to stand from chair ADL Goal: Lower Body Dressing - Progress: Goal set today Pt Will Transfer to Toilet: with supervision;with transfer board;Drop arm 3-in-1 ADL Goal: Toilet Transfer - Progress: Goal set today Pt Will Perform Tub/Shower Transfer: Tub transfer;Transfer tub bench;with transfer board ADL Goal: Tub/Shower Transfer - Progress: Goal set today  Visit Information  Last OT Received On: 07/02/12 Assistance Needed: +2 PT/OT Co-Evaluation/Treatment: Yes    Subjective Data  Subjective: "My ankle is much more swollen." Patient Stated Goal: to get home   Prior Functioning     Home Living Lives With: Spouse Available Help at Discharge: Family;Available 24 hours/day Type of Home: House Home Access: Ramped entrance Home Layout: One level Bathroom Shower/Tub: Engineer, manufacturing systems:  Standard Bathroom Accessibility: Yes How Accessible: Accessible via wheelchair Home Adaptive Equipment: Bedside commode/3-in-1;Tub transfer bench;Walker - standard;Wheelchair - manual Prior Function Level of Independence: Needs assistance Needs Assistance: Bathing;Toileting;Gait;Meal Prep;Light Housekeeping Bath: Minimal Toileting: Minimal Meal Prep: Moderate Light Housekeeping: Moderate Gait Assistance: Working with HHPT on taking steps with std walker and Rt. AFO. Able to Take Stairs?: No Driving: No Communication Communication: No difficulties Dominant Hand: Right         Vision/Perception Vision - History Baseline Vision: No visual deficits Patient Visual Report: No change from baseline Vision - Assessment Vision Assessment: Vision not tested   Cognition  Cognition Overall Cognitive Status: Appears within functional limits for tasks assessed/performed Arousal/Alertness: Awake/alert Orientation Level: Oriented X4 / Intact Behavior During Session: [facility] for tasks performed    Extremity/Trunk Assessment Right Upper Extremity Assessment RUE ROM/Strength/Tone: WFL for tasks assessed RUE Sensation: WFL - Light Touch RUE Coordination: WFL - gross/fine motor Left Upper Extremity Assessment LUE ROM/Strength/Tone: WFL for tasks assessed LUE Sensation: WFL - Light Touch LUE Coordination: WFL - gross/fine motor Trunk Assessment Trunk Assessment: Normal     Mobility Bed Mobility Bed Mobility: Rolling Right;Right Sidelying to Sit;Sitting - Scoot to Edge of Bed Rolling Right: 5: Supervision;With rail Right Sidelying to Sit: 4: Min assist;HOB flat;With rails Sitting - Scoot to Edge of Bed: 5: Supervision Sit to Supine: 3: Mod assist;HOB flat;With rail;Other (comment) (assist to get legs on bed.) Details for Bed Mobility Assistance: Cues to hook L leg behind right to assist R onto bed.  Very difficult as L leg is still weak as well.   Transfers Transfers: Sit to Stand;Stand  to Sit Sit to Stand: 1: +2 Total assist;With upper extremity assist;From bed;From elevated surface Sit to Stand: Patient Percentage: 60% Stand to Sit: 1: +2 Total assist;To elevated surface;To bed;With upper extremity assist Stand to Sit: Patient Percentage: 60% Transfer via Lift Equipment: Huntley Dec Lift (Pt would benefit from use of sara if he was to get into chai) Details for Transfer Assistance: cues for use of UEs to self assist;  assist to block R knee and bring wt over LEs     Exercise     Balance     End of Session OT - End of Session Equipment Utilized During Treatment: Gait belt Activity Tolerance: Patient tolerated treatment well Patient left: in bed;with call bell/phone within reach;with family/visitor present Nurse Communication: Mobility status  GO     Hope Budds 07/02/2012, 11:24 AM [phone]

## 2012-07-02 NOTE — Progress Notes (Signed)
ANTICOAGULATION CONSULT NOTE - Initial Consult  Pharmacy Consult for Coumadin and Lovenox Indication: Presumed PE and (+)R-DVT  No Known Allergies  Patient Measurements: Height: 6\' 3"  (190.5 cm) Weight: 293 lb 3.4 oz (133 kg) IBW/kg (Calculated) : 84.5 Heparin Dosing Weight:   Vital Signs: Temp: 97.7 F (36.5 C) (04/04 0444) Temp src: Oral (04/04 0444) BP: 115/52 mmHg (04/04 0444) Pulse Rate: 71 (04/04 0444)  Labs:  Recent Labs  06/29/12 1340  06/30/12 0505 06/30/12 1130 07/01/12 0520 07/02/12 0500  HGB  --   < > 7.9*  --  7.7* 8.1*  HCT  --   --  23.1*  --  22.8* 23.5*  PLT  --   --  203  --  229 235  LABPROT  --   --   --  13.9 14.4 14.6  INR  --   --   --  1.08 1.14 1.16  HEPARINUNFRC 0.21*  --  0.26* 0.19*  --   --   CREATININE  --   --  1.29  --   --  1.27  < > = values in this interval not displayed.  Estimated Creatinine Clearance: 98.9 ml/min (by C-G formula based on Cr of 1.27).   Medical History: Past Medical History  Diagnosis Date  . Chronic back pain   . Hypertension   . Gout   . Headache   . Arthritis     Medications:  Scheduled:  . amLODipine  5 mg Oral Daily  . [COMPLETED] coumadin book  1 each Does not apply Once  . enoxaparin (LOVENOX) injection  1 mg/kg Subcutaneous Q12H  . furosemide  40 mg Intravenous BID  . gabapentin  600 mg Oral TID  . irbesartan  150 mg Oral Daily  . polyethylene glycol  17 g Oral Daily  . potassium chloride  30 mEq Oral Q12H  . sodium chloride  1,000 mL Intravenous Once  . [COMPLETED] warfarin  10 mg Oral ONCE-1800  . warfarin  1 each Does not apply Once  . Warfarin - Pharmacist Dosing Inpatient   Does not apply q1800    Assessment: 51yo male with (+)DVT and probable PE on day #3/5 minimum overlap of Lovenox and Coumadin.  INR remains low this AM at 1.16.  No bleeding problems noted.  Goal of Therapy:  INR 2-3 Monitor platelets by anticoagulation protocol: Yes   Plan:  1.  Coumadin 15mg  today 2.   F/U INR in AM  Marisue Humble, PharmD Clinical Pharmacist [facility]

## 2012-07-02 NOTE — Clinical Social Work Psychosocial (Signed)
Clinical Social Work Department BRIEF PSYCHOSOCIAL ASSESSMENT 07/02/2012  Patient:  Matthew Juarez, Matthew Juarez     Account Number:  [id number]     Admit date:  06/23/2012  Clinical Social Worker:  Delmer Islam  Date/Time:  07/02/2012 06:24 AM  Referred by:  Care Management  Date Referred:  07/01/2012 Referred for  Psychosocial assessment   Other Referral:   Interview type:  Family Other interview type:   CSW talked with patient and wife Crystal together    PSYCHOSOCIAL DATA Living Status:  WIFE Admitted from facility:   Level of care:   Primary support name:  Catering manager Primary support relationship to patient:  SPOUSE Degree of support available:   Strong support    CURRENT CONCERNS Current Concerns  Other - See comment   Other Concerns:   Disability    SOCIAL WORK ASSESSMENT / PLAN CSW introduced self and asked how I could be of assistance. Patient and wife had questions about applying for SSA disability and if application could be started in the hospital. CSW explained that application would need to be made at [facility] and more conversation was had about disability benefits. CSW encouraged wife and/or patient to go on SSA website to gather more information about disability and applying.    Also talked with patient about home vs SNF however the physical therapist came in while talking with patient and CSW advised that patient making good progress and will be able to d/c home.   Assessment/plan status:  No Further Intervention Required Other assessment/ plan:   Information/referral to community resources:   Information regarding SSA disability    PATIENT'S/FAMILY'S RESPONSE TO PLAN OF CARE: Patient and wife very appreciative of CSW talking with them and inforomation shared about disability. CSW signing off as no other CSW services requested at this time. Please reconsult if needed.

## 2012-07-03 ENCOUNTER — Inpatient Hospital Stay (HOSPITAL_COMMUNITY): Payer: BC Managed Care – PPO

## 2012-07-03 DIAGNOSIS — R195 Other fecal abnormalities: Secondary | ICD-10-CM

## 2012-07-03 LAB — CLOSTRIDIUM DIFFICILE BY PCR: Toxigenic C. Difficile by PCR: POSITIVE — AB

## 2012-07-03 LAB — CBC
Hemoglobin: 8.5 g/dL — ABNORMAL LOW (ref 13.0–17.0)
MCH: 28.7 pg (ref 26.0–34.0)
MCHC: 33.2 g/dL (ref 30.0–36.0)
RDW: 14.4 % (ref 11.5–15.5)

## 2012-07-03 LAB — CULTURE, BLOOD (ROUTINE X 2): Culture: NO GROWTH

## 2012-07-03 LAB — BASIC METABOLIC PANEL
BUN: 18 mg/dL (ref 6–23)
Creatinine, Ser: 1.3 mg/dL (ref 0.50–1.35)
GFR calc non Af Amer: 61 mL/min — ABNORMAL LOW (ref >90)
Glucose, Bld: 90 mg/dL (ref 70–99)
Potassium: 3.8 mEq/L (ref 3.5–5.1)

## 2012-07-03 MED ORDER — FUROSEMIDE 40 MG PO TABS
40.0000 mg | ORAL_TABLET | Freq: Every day | ORAL | Status: DC
  Administered 2012-07-03 – 2012-07-06 (×4): 40 mg via ORAL
  Filled 2012-07-03 (×4): qty 1

## 2012-07-03 MED ORDER — WARFARIN SODIUM 10 MG PO TABS
10.0000 mg | ORAL_TABLET | Freq: Once | ORAL | Status: AC
  Administered 2012-07-03: 10 mg via ORAL
  Filled 2012-07-03: qty 1

## 2012-07-03 MED ORDER — METRONIDAZOLE 500 MG PO TABS
500.0000 mg | ORAL_TABLET | Freq: Three times a day (TID) | ORAL | Status: DC
  Administered 2012-07-03 – 2012-07-05 (×5): 500 mg via ORAL
  Filled 2012-07-03 (×9): qty 1

## 2012-07-03 NOTE — Progress Notes (Signed)
ANTICOAGULATION CONSULT NOTE - Follow Up Consult  Pharmacy Consult for Lovenox and Coumadin Indication: pulmonary embolus, (+)R-DVT  No Known Allergies  Patient Measurements: Height: 6\' 3"  (190.5 cm) Weight: 278 lb 9.6 oz (126.372 kg) IBW/kg (Calculated) : 84.5 Heparin Dosing Weight:   Vital Signs: Temp: 98.7 F (37.1 C) (04/05 0448) Temp src: Oral (04/05 0448) BP: 101/72 mmHg (04/05 1053) Pulse Rate: 69 (04/05 0448)  Labs:  Recent Labs  07/01/12 0520 07/02/12 0500 07/03/12 0505  HGB 7.7* 8.1* 8.5*  HCT 22.8* 23.5* 25.6*  PLT 229 235 274  LABPROT 14.4 14.6 16.8*  INR 1.14 1.16 1.40  CREATININE  --  1.27 1.30    Estimated Creatinine Clearance: 94.2 ml/min (by C-G formula based on Cr of 1.3).   Medications:  Scheduled:  . amLODipine  5 mg Oral Daily  . enoxaparin (LOVENOX) injection  1 mg/kg Subcutaneous Q12H  . furosemide  40 mg Oral Daily  . gabapentin  600 mg Oral TID  . irbesartan  150 mg Oral Daily  . polyethylene glycol  17 g Oral Daily  . potassium chloride  30 mEq Oral Q12H  . sodium chloride  1,000 mL Intravenous Once  . [COMPLETED] warfarin  15 mg Oral ONCE-1800  . [COMPLETED] warfarin  1 each Does not apply Once  . Warfarin - Pharmacist Dosing Inpatient   Does not apply q1800  . [DISCONTINUED] furosemide  40 mg Intravenous BID    Assessment: 52yo male with (+)DVT and presumed PE, on day #4/5 minimum overlap of Lovenox with Coumadin.  INR with good jump to 1.4 today, Hg with upward trend and pltc wnl.  No bleeding problems.  Noted weight change with significant diuresis on Lasix over last few days, at 126kg this AM (133kg yesterday).  Goal of Therapy:  INR 2-3 Monitor platelets by anticoagulation protocol: Yes   Plan:  1.  Coumadin 10mg  today 2.  Continue Lovenox at current dosing 3.  F/U weight tomorrow- adjust Lovenox dosing if needed  Marisue Humble, PharmD Clinical Pharmacist [facility]

## 2012-07-03 NOTE — Progress Notes (Signed)
07/03/12 Patient had positive results from C-diff PCR lab called results to nurse.

## 2012-07-03 NOTE — Progress Notes (Signed)
C. Diff positive   Flagyl initiated per protocol. Monitor for worsening leukocytosis or abdominal distention/signs of severe C. Diff.

## 2012-07-03 NOTE — Progress Notes (Signed)
PULMONARY  / CRITICAL CARE MEDICINE  Name: Matthew Juarez MRN: [id number] DOB: [birth year]    ADMISSION DATE:  06/23/2012 CONSULTATION DATE: 06/23/2012  REFERRING MD :  EDP  CHIEF COMPLAINT:  Cardiac arrest  BRIEF PATIENT DESCRIPTION: 52 y/o recently admitted with cervicothoracic transverse myelitis, R distal femur fracture and R DVT admitted after suffering PEA arrest  Secondary to suspected PE.  SIGNIFICANT EVENTS / STUDIES:  3/26 - Admitted after PEA arrest, PE suspected 3/26 - Venous Doppler >>> POSITIVE >>> Findings consistent with deep vein thrombosis involving the right popliteal vein, right posterial tibial vein, and right peroneal vein. 3/26 - 2D echo >>> POSITIVE >>> EF 70%, severe RV dilation / disfunction 4/03 - Thoracentesis with 1500 ml bloody fluid removed, exudative effusion 4/04 - CXR with atx, hgb stable  LINES / TUBES: OETT 3/26 >>>3-29 OGT 3/26 >>>3-28 R IJ TLC 3/26  >>> R fem A line 3/26 >>>3-28  CULTURES: 3/26 Blood 1/2 >>> coag negative staph 3/26 Urine >>> negative 3/26 Respiratory >>> MSSA 3/26 MRSA screen >> negative  ANTIBIOTICS: unasyn 3/26 >>4/2 vanco 3/27 >> 3/30  SUBJECTIVE:  Patient says "excellent". Did bed to chair transfer with PT.  Denies hemoptysis, shortness of breath, chest pain. Discussed support hose. Had loose stool- C.diff assay pending.   VITAL SIGNS: Temp:  [98.7 F (37.1 C)-100.4 F (38 C)] 98.7 F (37.1 C) (04/05 0448) Pulse Rate:  [65-72] 69 (04/05 0448) Resp:  [18-19] 19 (04/04 2129) BP: (92-145)/(54-73) 98/54 mmHg (04/05 0452) SpO2:  [92 %-97 %] 92 % (04/05 0448) Weight:  [126.372 kg (278 lb 9.6 oz)] 126.372 kg (278 lb 9.6 oz) (04/05 0543) 2L O2    INTAKE / OUTPUT: Intake/Output     04/04 0701 - 04/05 0700 04/05 0701 - 04/06 0700   P.O. 240    I.V. (mL/kg)     Total Intake(mL/kg) 240 (1.9)    Urine (mL/kg/hr) 4850 (1.6)    Stool 1 (0)    Total Output 4851     Net -4611            PHYSICAL  EXAMINATION: General:  Well appearing male in NAD. Feeding himself. Neuro:  Awake and alert, preexisting LE weakness. Follows commands and responds appropriately.  HEENT:  PERRL,  Cardiovascular:  RRR, no murmurs Lungs:  Clear bilaterally.  Decreased R>L Abdomen:  Obese, soft, bowel sounds present GU: hematuria resolved Musculoskeletal:  1-2+ edema, lower ext muscle wasting. Bed in semi-sitting position- will aggravate edema with support hose now off. Skin:  intact  LABS:  Recent Labs Lab 06/30/12 0505 07/02/12 0500 07/03/12 0505  NA 141 139 137  K 3.7 3.7 3.8  CL 106 103 101  CO2 27 29 28   BUN 15 15 18   CREATININE 1.29 1.27 1.30  GLUCOSE 93 86 90    Recent Labs Lab 07/01/12 0520 07/02/12 0500 07/03/12 0505  HGB 7.7* 8.1* 8.5*  HCT 22.8* 23.5* 25.6*  WBC 7.4 7.4 7.5  PLT 229 235 274   Lab Results  Component Value Date   INR 1.40 07/03/2012   INR 1.16 07/02/2012   INR 1.14 07/01/2012   No results found for this basename: GLUCAP,  in the last 168 hours Imaging: 4/4 R>L effusion/atx, effusion smaller but ? Increase from 4/3 vs atx    Resolved issues Acute resp failure in setting of PE PEA cardiac arrest in setting of suspected PE.  Shock, cardiogenic, improved Hypokalemia (resolved) Relative adrenal insuff. (now off steroids) Suspected aspiration pneumonia:  Abx / Cx as above, plan 7 day course >> d/c 4/3   ASSESSMENT / PLAN:  PE/DVT: presume propagated by immobility.  HX OF RLE DVT prior to admit.  Anemia, thrombocytopenia -2 gm drop hemoglobin with hematuria.  Hematuria resolved 3/31.  Some of the anemia may be hemoconcentration   4/5- Hgb trending up. No overt blood loss. P:  -Trend CBC -Changed heparin to lovenox 4/2 -committed to anticoagulation for 6-12 months -->?life given 2nd episode of clotting (RLE DVT prior to admit) -cont warfarin  - encourage support hose  R>L effusions/atx - s/p thoracentesis 4/3 with 1500 ml bloody fluid removed 4/5- Culture  pending. No orgs on smear. P: -continue increased dose lasix, changed to oral 4/5  -OT working with edema in LE. 4/5- I asked nursing to keep support hose on whenever legs are dependent. -faxed script for LE compression stockings to [facility] 4/4 -assess for O2 needs prior to d/c  H/o HTN  P:  -Continue current medications  AKI - resolving.  Hematuria - resolved.   P:   -Follow BMP  -Discontinue Foley cath  Congestive hepatopathy in setting of shock.  P:   Trend LFTs Protonix for DVT Px  Acute encephalopathy.  Hx of transverse myelitis. Resolved 3-29 Wide awake. Residual lower ext weakness from preexisting transverse myelitis.   P:   -monitor -continue neurontin   Deconditioning   P: -PT/OT consult, declined for CIR.  Search for SNF for Rehab.   -arrange for home PT, OT, RN, aide.  RN will need to draw home INR and send results to Dr. Renae Gloss. -hold d/c of central line for lab draws (difficult stick)  Loose Stool -C.diff precautions and assay. Will medicate as needed.  TODAY'S SUMMARY: 52 y/o recently admitted with cervicothoracic transverse myelitis, R distal femur fracture and R DVT admitted after suffering PEA arrest  Secondary to suspected PE. Venous Doppler suggestive of R DVT. 2D echo reports severe RV dysfunction / dilation. CTA deferred - AKI.  Emperic Heparin > restarted 3/31, follow CBC and for more hematuria. Transfer to floor 4/1.  4/3 R effusion tap with 1500 ml bloody fluid removed.  Declined by CIR.  Family & PT feels he can go home when medically ready as he has learned 'rehab' techniques with previous transverse myelitis.   4/5- loose stools issue to be resolved over weekend. ? Home 4/7?    [name], MD PCCM [phone] M [phone] After 3:00PM [phone]

## 2012-07-04 ENCOUNTER — Inpatient Hospital Stay (HOSPITAL_COMMUNITY): Payer: BC Managed Care – PPO

## 2012-07-04 LAB — CBC
Hemoglobin: 8.7 g/dL — ABNORMAL LOW (ref 13.0–17.0)
MCH: 28.5 pg (ref 26.0–34.0)
MCV: 85.2 fL (ref 78.0–100.0)
RBC: 3.05 MIL/uL — ABNORMAL LOW (ref 4.22–5.81)

## 2012-07-04 LAB — BODY FLUID CULTURE: Culture: NO GROWTH

## 2012-07-04 MED ORDER — WARFARIN SODIUM 10 MG PO TABS
12.5000 mg | ORAL_TABLET | Freq: Once | ORAL | Status: AC
  Administered 2012-07-04: 12.5 mg via ORAL
  Filled 2012-07-04: qty 1

## 2012-07-04 NOTE — Progress Notes (Signed)
ANTICOAGULATION CONSULT NOTE - Follow Up Consult  Pharmacy Consult for Coumadin Indication: pulmonary embolus and DVT  No Known Allergies  Patient Measurements: Height: 6\' 3"  (190.5 cm) Weight: 281 lb 8.4 oz (127.7 kg) IBW/kg (Calculated) : 84.5 Heparin Dosing Weight:   Vital Signs: Temp: 98.6 F (37 C) (04/06 0500) Temp src: Oral (04/06 0500) BP: 113/69 mmHg (04/06 0500) Pulse Rate: 68 (04/06 0500)  Labs:  Recent Labs  07/02/12 0500 07/03/12 0505 07/04/12 0448  HGB 8.1* 8.5* 8.7*  HCT 23.5* 25.6* 26.0*  PLT 235 274 266  LABPROT 14.6 16.8* 18.1*  INR 1.16 1.40 1.55*  CREATININE 1.27 1.30  --     Estimated Creatinine Clearance: 94.6 ml/min (by C-G formula based on Cr of 1.3).   Medications:  Scheduled:  . amLODipine  5 mg Oral Daily  . enoxaparin (LOVENOX) injection  1 mg/kg Subcutaneous Q12H  . furosemide  40 mg Oral Daily  . gabapentin  600 mg Oral TID  . irbesartan  150 mg Oral Daily  . metroNIDAZOLE  500 mg Oral Q8H  . polyethylene glycol  17 g Oral Daily  . potassium chloride  30 mEq Oral Q12H  . sodium chloride  1,000 mL Intravenous Once  . [COMPLETED] warfarin  10 mg Oral ONCE-1800  . Warfarin - Pharmacist Dosing Inpatient   Does not apply q1800    Assessment: 51yo male with (+)RLE DVT and presumed PE on day #5/5 minimum overlap of Lovenox and Coumadin.  INR is increasing, however has not reached goal.  Lovenox should continue until INR is therapeutic x 24hr.  Hg and pltc are ~stable.  No bleeding problems noted.  Goal of Therapy:  INR 2-3 Monitor platelets by anticoagulation protocol: Yes   Plan:  1.  Coumadin 12.5mg  2.  Continue Lovenox until INR therapeutic x 24hr 3.  F/U AM labs  Marisue Humble, PharmD Clinical Pharmacist [facility]

## 2012-07-04 NOTE — Progress Notes (Signed)
PULMONARY  / CRITICAL CARE MEDICINE  Name: Matthew Juarez MRN: [id number] DOB: [birth year]    ADMISSION DATE:  06/23/2012 CONSULTATION DATE: 06/23/2012  REFERRING MD :  EDP  CHIEF COMPLAINT:  Cardiac arrest  BRIEF PATIENT DESCRIPTION: 52 y/o recently admitted with cervicothoracic transverse myelitis, R distal femur fracture and R DVT admitted after suffering PEA arrest  Secondary to suspected PE.  SIGNIFICANT EVENTS / STUDIES:  3/26 - Admitted after PEA arrest, PE suspected 3/26 - Venous Doppler >>> POSITIVE >>> Findings consistent with deep vein thrombosis involving the right popliteal vein, right posterial tibial vein, and right peroneal vein. 3/26 - 2D echo >>> POSITIVE >>> EF 70%, severe RV dilation / disfunction 4/03 - Thoracentesis with 1500 ml bloody fluid removed, exudative effusion 4/04 - CXR with atx, hgb stable  LINES / TUBES: OETT 3/26 >>>3-29 OGT 3/26 >>>3-28 R IJ TLC 3/26  >>> R fem A line 3/26 >>>3-28  CULTURES: 3/26 Blood 1/2 >>> coag negative staph 3/26 Urine >>> negative 3/26 Respiratory >>> MSSA 3/26 MRSA screen >> negative  ANTIBIOTICS: unasyn 3/26 >>4/2 vanco 3/27 >> 3/30  SUBJECTIVE:    Denies hemoptysis, shortness of breath, chest pain. Discussed support hose.  Had loose stool- C.diff assay positive. Flagyl started. Denies GI distress.   VITAL SIGNS: Temp:  [98.6 F (37 C)-99.6 F (37.6 C)] 98.6 F (37 C) (04/06 0500) Pulse Rate:  [66-72] 68 (04/06 0500) Resp:  [16-18] 18 (04/06 0500) BP: (97-113)/(52-72) 113/69 mmHg (04/06 0500) SpO2:  [96 %-97 %] 96 % (04/06 0500) Weight:  [127.7 kg (281 lb 8.4 oz)] 127.7 kg (281 lb 8.4 oz) (04/06 0500) 2L O2    INTAKE / OUTPUT: Intake/Output     04/05 0701 - 04/06 0700 04/06 0701 - 04/07 0700   P.O.     I.V. (mL/kg) 1181.7 (9.3) 35 (0.3)   Total Intake(mL/kg) 1181.7 (9.3) 35 (0.3)   Urine (mL/kg/hr) 800 (0.3)    Stool     Total Output 800     Net +381.7 +35          PHYSICAL  EXAMINATION: General:  Well appearing male in NAD. Feeding himself. Conversational on phone Neuro:  Awake and alert, preexisting LE weakness. Follows commands and responds appropriately.  HEENT:  PERRL, speech clear Cardiovascular:  RRR, no murmurs Lungs:  Clear bilaterally.  Decreased R>L Abdomen:  Obese, soft, bowel sounds present. Non-tender GU: hematuria resolved Musculoskeletal:  1-2+ edema, lower ext muscle wasting. Support hose on. Skin:  intact  LABS:  Recent Labs Lab 06/30/12 0505 07/02/12 0500 07/03/12 0505  NA 141 139 137  K 3.7 3.7 3.8  CL 106 103 101  CO2 27 29 28   BUN 15 15 18   CREATININE 1.29 1.27 1.30  GLUCOSE 93 86 90    Recent Labs Lab 07/02/12 0500 07/03/12 0505 07/04/12 0448  HGB 8.1* 8.5* 8.7*  HCT 23.5* 25.6* 26.0*  WBC 7.4 7.5 7.7  PLT 235 274 266   Lab Results  Component Value Date   INR 1.55* 07/04/2012   INR 1.40 07/03/2012   INR 1.16 07/02/2012   No results found for this basename: GLUCAP,  in the last 168 hours Imaging: 4/4 R>L effusion/atx, effusion smaller but ? Increase from 4/3 vs atx    Resolved issues Acute resp failure in setting of PE PEA cardiac arrest in setting of suspected PE.  Shock, cardiogenic, improved Hypokalemia (resolved) Relative adrenal insuff. (now off steroids) Suspected aspiration pneumonia: Abx / Cx as above, plan  7 day course >> d/c 4/3   ASSESSMENT / PLAN:  PE/DVT: presume propagated by immobility.  HX OF RLE DVT prior to admit.  Anemia, thrombocytopenia -2 gm drop hemoglobin with hematuria.  Hematuria resolved 3/31.  Some of the anemia may be hemoconcentration   4/5- Hgb trending up. No overt blood loss. P:  -Trend CBC -Changed heparin to lovenox 4/2.  Pharmacy following -committed to anticoagulation for 6-12 months -->?life given 2nd episode of clotting (RLE DVT prior to admit) -cont warfarin  - encourage support hose  R>L effusions/atx - s/p thoracentesis 4/3 with 1500 ml bloody fluid  removed 4/5- Culture pending. No orgs on smear. P: -continue increased dose lasix, changed to oral 4/5  -OT working with edema in LE. 4/5- I asked nursing to keep support hose on whenever legs are dependent. -faxed script for LE compression stockings to [facility] 4/4 -assess for O2 needs prior to d/c -CXR 4/6  H/o HTN  P:  -Continue current medications  AKI - resolving.  Hematuria - resolved.   P:   -Follow BMP  -Discontinue Foley cath  Congestive hepatopathy in setting of shock.  P:   Trend LFTs Protonix for DVT Px  Acute encephalopathy.  Hx of transverse myelitis. Resolved 3-29 Wide awake. Residual lower ext weakness from preexisting transverse myelitis.   P:   -monitor -continue neurontin   Deconditioning   P: -PT/OT consult, declined for CIR.  Search for SNF for Rehab.   -arrange for home PT, OT, RN, aide.  RN will need to draw home INR and send results to Dr. Renae Juarez. -hold d/c of central line for lab draws (difficult stick)  Loose Stool -C.diff positive. Flagyl started.  TODAY'S SUMMARY: 52 y/o recently admitted with cervicothoracic transverse myelitis, R distal femur fracture and R DVT admitted after suffering PEA arrest  Secondary to suspected PE. Venous Doppler suggestive of R DVT. 2D echo reports severe RV dysfunction / dilation. CTA deferred - AKI.  Emperic Heparin > restarted 3/31, follow CBC and for more hematuria. Transfer to floor 4/1.  4/3 R effusion tap with 1500 ml bloody fluid removed.  Declined by CIR.  Family & PT feels he can go home when medically ready as he has learned 'rehab' techniques with previous transverse myelitis.   C.diff- Flagyl. Updating CXR ? Home 4/7?    Matthew Budge, MD PCCM [phone] After 3:00PM [phone]

## 2012-07-05 DIAGNOSIS — A0472 Enterocolitis due to Clostridium difficile, not specified as recurrent: Secondary | ICD-10-CM | POA: Diagnosis not present

## 2012-07-05 MED ORDER — VANCOMYCIN 50 MG/ML ORAL SOLUTION: 125.0000 mg | Freq: Four times a day (QID) | ORAL | Status: AC

## 2012-07-05 MED ORDER — WARFARIN SODIUM 2.5 MG PO TABS
12.5000 mg | ORAL_TABLET | Freq: Once | ORAL | Status: AC
  Administered 2012-07-05: 12.5 mg via ORAL
  Filled 2012-07-05: qty 1

## 2012-07-05 MED ORDER — ENOXAPARIN SODIUM 150 MG/ML ~~LOC~~ SOLN
125.0000 mg | Freq: Two times a day (BID) | SUBCUTANEOUS | Status: DC
  Administered 2012-07-05 – 2012-07-06 (×3): 125 mg via SUBCUTANEOUS
  Filled 2012-07-05 (×4): qty 1

## 2012-07-05 MED ORDER — ENOXAPARIN SODIUM 150 MG/ML ~~LOC~~ SOLN
125.0000 mg/kg | Freq: Two times a day (BID) | SUBCUTANEOUS | Status: DC
  Filled 2012-07-05 (×2): qty 114

## 2012-07-05 MED ORDER — POTASSIUM CHLORIDE CRYS ER 20 MEQ PO TBCR
40.0000 meq | EXTENDED_RELEASE_TABLET | Freq: Every day | ORAL | Status: DC
  Administered 2012-07-06: 40 meq via ORAL
  Filled 2012-07-05: qty 2

## 2012-07-05 MED ORDER — VANCOMYCIN 50 MG/ML ORAL SOLUTION
125.0000 mg | Freq: Four times a day (QID) | ORAL | Status: DC
  Administered 2012-07-05 – 2012-07-06 (×6): 125 mg via ORAL
  Filled 2012-07-05 (×9): qty 2.5

## 2012-07-05 NOTE — Progress Notes (Signed)
ANTICOAGULATION CONSULT NOTE - Follow Up Consult  Pharmacy Consult for Coumadin Indication: pulmonary embolus and DVT  No Known Allergies  Patient Measurements: Height: 6\' 3"  (190.5 cm) Weight: 274 lb 0.5 oz (124.3 kg) IBW/kg (Calculated) : 84.5  Vital Signs: Temp: 98.5 F (36.9 C) (04/07 0654) BP: 93/56 mmHg (04/07 1030) Pulse Rate: 70 (04/07 1030)  Labs:  Recent Labs  07/03/12 0505 07/04/12 0448 07/05/12 0500  HGB 8.5* 8.7*  --   HCT 25.6* 26.0*  --   PLT 274 266  --   LABPROT 16.8* 18.1* 19.8*  INR 1.40 1.55* 1.75*  CREATININE 1.30  --   --     Estimated Creatinine Clearance: 93.3 ml/min (by C-G formula based on Cr of 1.3).   Medications:  Scheduled:  . amLODipine  5 mg Oral Daily  . enoxaparin (LOVENOX) injection  1 mg/kg Subcutaneous Q12H  . furosemide  40 mg Oral Daily  . gabapentin  600 mg Oral TID  . irbesartan  150 mg Oral Daily  . metroNIDAZOLE  500 mg Oral Q8H  . polyethylene glycol  17 g Oral Daily  . potassium chloride  30 mEq Oral Q12H  . sodium chloride  1,000 mL Intravenous Once  . [COMPLETED] warfarin  12.5 mg Oral ONCE-1800  . Warfarin - Pharmacist Dosing Inpatient   Does not apply q1800    Assessment: 52yo male with (+)RLE DVT and presumed PE on day #6 overlap of Lovenox and Coumadin.  INR is increasing, however has not reached goal.  Lovenox should continue until INR is therapeutic x 24hr.  Hg and pltc are ~stable.  No bleeding problems noted.  Goal of Therapy:  INR 2-3 Monitor platelets by anticoagulation protocol: Yes   Plan:  1.  Coumadin 12.5mg  2.  Decrease Lovenox to 125mg  sq Q 12hrs (weight adjustment), and continue until INR therapeutic x 24hr 3.  F/U AM labs  Bayard Hugger, PharmD, BCPS  Clinical Pharmacist  Pager: [phone]

## 2012-07-05 NOTE — Progress Notes (Signed)
PULMONARY  / CRITICAL CARE MEDICINE  Name: Matthew Juarez MRN: [id number] DOB: 06-26-1960    ADMISSION DATE:  06/23/2012 CONSULTATION DATE: 06/23/2012  REFERRING MD :  EDP  CHIEF COMPLAINT:  Cardiac arrest  BRIEF PATIENT DESCRIPTION: 52 y/o recently admitted with cervicothoracic transverse myelitis, R distal femur fracture and R DVT presented after suffering PEA arrest secondary to suspected PE.  SIGNIFICANT EVENTS / STUDIES:  3/26 - Admitted after PEA arrest, PE suspected 3/26 - Venous Doppler >>> POSITIVE >>> Findings consistent with deep vein thrombosis involving the right popliteal vein, right posterial tibial vein, and right peroneal vein. 3/26 - 2D echo >>> POSITIVE >>> EF 70%, severe RV dilation / disfunction 4/03 - Thoracentesis with 1500 ml bloody fluid removed, exudative effusion 4/04 - CXR with atx, hgb stable 4/05 - new dx c-diff 4/07 - hgb stable, change flagyl to oral vanco, mild soft BP  LINES / TUBES: OETT 3/26 >>>3-29 OGT 3/26 >>>3-28 R IJ TLC 3/26  >>>4/7 R fem A line 3/26 >>>3-28  CULTURES: 3/26 Blood 1/2 >>> coag negative staph 3/26 Urine >>> negative 3/26 Respiratory >>> MSSA 3/26 MRSA screen >> negative 4/4 C-Diff PCR>>>POSITIVE  ANTIBIOTICS: unasyn 3/26 >>4/2 vanco 3/27 >> 3/30 Flagyl 4/5 (C-diff pos)>>>   SUBJECTIVE:  Denies hemoptysis, shortness of breath, chest pain. Up to chair for first time, mild dizziness with transfer that quickly resolved.  Continues to have loose stool.   VITAL SIGNS: Temp:  [98.2 F (36.8 C)-98.9 F (37.2 C)] 98.5 F (36.9 C) (04/07 0654) Pulse Rate:  [65-78] 70 (04/07 1030) Resp:  [18] 18 (04/07 0654) BP: (93-112)/(56-67) 93/56 mmHg (04/07 1030) SpO2:  [95 %-98 %] 98 % (04/07 0654) Weight:  [274 lb 0.5 oz (124.3 kg)] 274 lb 0.5 oz (124.3 kg) (04/07 0654)    INTAKE / OUTPUT: Intake/Output     04/06 0701 - 04/07 0700 04/07 0701 - 04/08 0700   P.O. 840 280   I.V. (mL/kg) 240.3 (1.9)    Total Intake(mL/kg) 1080.3  (8.7) 280 (2.3)   Urine (mL/kg/hr) 4200 (1.4)    Total Output 4200     Net -3119.7 +280          PHYSICAL EXAMINATION: General:  Well appearing male in NAD. Conversational. Neuro:  Awake and alert, preexisting LE weakness. Follows commands and responds appropriately.  HEENT:  PERRL, speech clear Cardiovascular:  RRR, no murmurs Lungs:  Clear bilaterally, diminished lower Abdomen:  Obese, soft, bowel sounds present. Non-tender GU: hematuria resolved Musculoskeletal:  1-2+ edema, lower ext muscle wasting. Support hose on. Skin:  intact  LABS:  Recent Labs Lab 06/30/12 0505 07/02/12 0500 07/03/12 0505  NA 141 139 137  K 3.7 3.7 3.8  CL 106 103 101  CO2 27 29 28   BUN 15 15 18   CREATININE 1.29 1.27 1.30  GLUCOSE 93 86 90    Recent Labs Lab 07/02/12 0500 07/03/12 0505 07/04/12 0448  HGB 8.1* 8.5* 8.7*  HCT 23.5* 25.6* 26.0*  WBC 7.4 7.5 7.7  PLT 235 274 266   Lab Results  Component Value Date   INR 1.75* 07/05/2012   INR 1.55* 07/04/2012   INR 1.40 07/03/2012    Imaging: 4/6 CXR - no acute airspace disease   Resolved issues Acute resp failure in setting of PE PEA cardiac arrest in setting of suspected PE.  Shock, cardiogenic, improved Hypokalemia (resolved) Relative adrenal insuff. (now off steroids) Suspected aspiration pneumonia: Abx / Cx as above, plan 7 day course >> d/c  4/3   ASSESSMENT / PLAN:  PE/DVT: presume propagated by immobility.  HX OF RLE DVT prior to admit.  Anemia, thrombocytopenia -2 gm drop hemoglobin with hematuria.  Hematuria resolved 3/31.  Some of the anemia may be hemoconcentration  4/5- Hgb trending up. No overt blood loss.  P:  -Trend CBC -Changed heparin to lovenox 4/2.  Pharmacy following -committed to anticoagulation for 6-12 months -->?life given 2nd episode of clotting (RLE DVT prior to admit) -cont warfarin  -support hose  R>L effusions/atx - s/p thoracentesis 4/3 with 1500 ml bloody fluid removed.  4/5- Culture pending.  No orgs on smear.  CXR cleared 4/7.   P: -continue PO lasix / kcl 40 daily -OT working with edema in LE. 4/5- I asked nursing to keep support hose on whenever legs are dependent. -faxed script for LE compression stockings to [facility] 4/4 -no O2 needed for d/c   H/o HTN  P:  -Continue current medications  AKI - resolving.  Hematuria - resolved.   P:   -Follow BMP  -follow I/O's  Congestive hepatopathy in setting of shock.  Resolved.   P:   -d/c protonix  Acute encephalopathy.  Hx of transverse myelitis. Resolved 3-29 Wide awake. Residual lower ext weakness from preexisting transverse myelitis.   P:   -monitor -continue neurontin   Deconditioning   P: -PT/OT consult, declined for CIR.  Search for SNF for Rehab.   -arrange for home PT, OT, RN, aide.  RN will need to draw home INR and send results to Dr. Renae Gloss. -hold d/c of central line for lab draws (difficult stick)  C-Diff -C.diff positive 4/5. Started on flagyl, changed to PO vanco 4/7 due to coumadin interaction.   P: -change to oral vanco with flagyl / coumadin interaction -isolation precautions -case mgr asked to review vanco for pre-authorization   TODAY'S SUMMARY: 52 y/o recently admitted with cervicothoracic transverse myelitis, R distal femur fracture and R DVT admitted after suffering PEA arrest  Secondary to suspected PE. Venous Doppler suggestive of R DVT. 2D echo reports severe RV dysfunction / dilation. CTA deferred - AKI.  Emperic Heparin > restarted 3/31, follow CBC and for more hematuria. Transfer to floor 4/1.  4/3 R effusion tap with 1500 ml bloody fluid removed.  Declined by CIR.  Family & PT feels he can go home when medically ready as he has learned 'rehab' techniques with previous transverse myelitis.  4/5 C.diff- Flagyl. Plan for home 4/8 due to soft BP's and family discomfort with lovenox / coumadin levels, C-diff pos.     Canary Brim, NP-C [facility] Pgr:  [phone] or [phone]  Reviewed above, examined pt, and agree with assessment/plan.  Breathing better.  Diarrhea improved.  If INR > 2 then plan for d/c home 4/08.  Coralyn Helling, MD [facility] Pulmonary/Critical Care 07/05/2012, 2:16 PM Pager:  [phone] After 3pm call: [phone]

## 2012-07-05 NOTE — Progress Notes (Signed)
Occupational Therapy Treatment Patient Details Name: Matthew Juarez MRN: [id number] DOB: [birth year] Today's Date: 07/05/2012 Time: 1601-0932 OT Time Calculation (min): 45 min  OT Assessment / Plan / Recommendation Comments on Treatment Session Pt is progressing with LE ADLs.  Continues to need A with completeing adls.  Pt requested he have lower Cam boot instead of ankle brace(wife stated pt more comfortable with boot and doesn't really need it).  D/c is possible for today.     Follow Up Recommendations  Home health OT;Supervision/Assistance - 24 hour;Other (comment)       Equipment Recommendations  None recommended by OT    Recommendations for Other Services Rehab consult  Frequency Min 3X/week      Precautions / Restrictions Precautions Precautions: Fall Required Braces or Orthoses: Other Brace/Splint Other Brace/Splint: Cam walker boot on RLE (pt preferred this over the black ankle brace) Restrictions Weight Bearing Restrictions: No RLE Weight Bearing: Weight bearing as tolerated   Pertinent Vitals/Pain 10/10 for left knee pain repositioned and iced     ADL  Grooming: Performed;Wash/dry face;Set up Where Assessed - Grooming: Unsupported sitting Lower Body Dressing:  (pants) Where Assessed - Lower Body Dressing: Supported sit to stand Toilet Transfer: +2 Total assistance;Simulated Toilet Transfer: Patient Percentage: 50% Statistician Method: Ambulance person:  (from elevated be to recliner going to pt's left ) Toileting - Clothing Manipulation and Hygiene: +1 Total assistance (to pull up pants) Where Assessed - Toileting Clothing Manipulation and Hygiene:  (sit to stand at raised bed) Equipment Used: Rolling walker;Gait belt (Sara Plus) Transfers/Ambulation Related to ADLs: Pt able to squat pivot with min VCs on hand placement ADL Comments: Pt was able to donn clothing to above knees by crossing his legs in order to access clothing.  Once standing  therapist had to manipulate clothing rest of the way,       OT Goals ADL Goals ADL Goal: Lower Body Dressing - Progress: Progressing toward goals ADL Goal: Toilet Transfer - Progress: Progressing toward goals  Visit Information  Last OT Received On: 07/05/12 Assistance Needed: +2    Subjective Data  Subjective: I feell so much better in this chair      Cognition  Cognition Overall Cognitive Status: Appears within functional limits for tasks assessed/performed Arousal/Alertness: Awake/alert Orientation Level: Oriented X4 / Intact Behavior During Session: [facility] for tasks performed    Mobility  Bed Mobility Bed Mobility: Rolling Right;Right Sidelying to Sit;Sitting - Scoot to [place] of Bed Rolling Right: 5: Supervision Right Sidelying to Sit: With rails;HOB flat;4: Min guard Sitting - Scoot to [place] of Bed: 4: Min guard Details for Bed Mobility Assistance: Pt was able to complete bed mobility with few VCs Transfers Transfers: Sit to Stand;Stand to Sit Sit to Stand: 1: +2 Total assist;With upper extremity assist;From bed;From elevated surface Sit to Stand: Patient Percentage: 50% Stand to Sit: 1: +2 Total assist;With upper extremity assist;To chair/3-in-1 Stand to Sit: Patient Percentage: 50% Transfer via Lift Equipment: Hydrographic surveyor Details for Transfer Assistance: had to try twice to transfer to chair. Pt needed A with moving hips over to seat and placing hand on the farther arm rest. Also used the sara lift once pt in recliner to stand and place pillows under him due to his 6'5" frame. Needed VCs to stand straight once raised up by lift and keep knees centered       Balance Balance Balance Assessed: Yes Static Sitting Balance Static Sitting - Balance Support: No upper extremity  supported;Feet supported Static Sitting - Level of Assistance: 5: Stand by assistance Static Sitting - Comment/# of Minutes: 5 minutes Static Standing Balance Static Standing - Balance Support: Bilateral  upper extremity supported;During functional activity Static Standing - Level of Assistance: 3: Mod assist Static Standing - Comment/# of Minutes: 30 seconds   End of Session OT - End of Session Equipment Utilized During Treatment: Gait belt (lower leg boot(for comfort)) Activity Tolerance: Patient tolerated treatment well Patient left: in chair;with call bell/phone within reach;with family/visitor present       Dietrich Pates 07/05/2012, 1:09 PM

## 2012-07-05 NOTE — Progress Notes (Signed)
I have read and agree with this note. Ignacia Palma, [place] [phone] 07/05/2012

## 2012-07-05 NOTE — Progress Notes (Signed)
Physical Therapy Treatment Patient Details Name: Matthew Juarez MRN: [id number] DOB: [birth year] Today's Date: 07/05/2012 Time: 1610-1630 PT Time Calculation (min): 20 min  PT Assessment / Plan / Recommendation Comments on Treatment Session  Pt did well with transfer with [place] Dec Plus back to bed from a low chair.  He plans to use his transfer board and his wheelchair to transfer to home tomorrow. Pt agrees with plan to return home with HHPT.    Follow Up Recommendations  Home health PT     Does the patient have the potential to tolerate intense rehabilitation     Barriers to Discharge        Equipment Recommendations  None recommended by PT    Recommendations for Other Services    Frequency Min 3X/week   Plan Discharge plan remains appropriate;Frequency remains appropriate    Precautions / Restrictions Precautions Other Brace/Splint: Cam walker boot on RLE (pt preferred this over the black ankle brace)   Pertinent Vitals/Pain No c/o pain    Mobility  Bed Mobility Sit to Supine: 4: Min assist Transfers Transfer via Lift Equipment: Hydrographic surveyor Details for Transfer Assistance: pt sitting in low recliner chair without removeable armrests.  Pt shoe and CAM boot applied and pt assist to lean forward for Sara belt to be placed He assisted somewhat as lift brought him to standing.  He was able to weight bear through both legs and activate glutes to assist with standing Ambulation/Gait Ambulation/Gait Assistance: Not tested (comment)    Exercises     PT Diagnosis:    PT Problem List:   PT Treatment Interventions:     PT Goals Acute Rehab PT Goals PT Goal: Sit to Stand - Progress: Progressing toward goal PT Goal: Stand to Sit - Progress: Progressing toward goal PT Transfer Goal: Bed to Chair/Chair to Bed - Progress: Progressing toward goal  Visit Information  Last PT Received On: 07/05/12 Assistance Needed: +2    Subjective Data  Subjective: "Ok, I'll get back to bed" Patient  Stated Goal: to go home tomorrow   Cognition  Cognition Overall Cognitive Status: Appears within functional limits for tasks assessed/performed Arousal/Alertness: Awake/alert Orientation Level: Oriented X4 / Intact Behavior During Session: [facility] for tasks performed    Balance  Static Sitting Balance Static Sitting - Comment/# of Minutes: 5 minutes Static Standing Balance Static Standing - Level of Assistance: 3: Mod assist Static Standing - Comment/# of Minutes: 30 seconds  End of Session PT - End of Session Activity Tolerance: Patient tolerated treatment well Patient left: in bed;with family/visitor present Nurse Communication: Mobility status   GP    Bayard Hugger. [place], [place] [phone] 07/05/2012, 4:59 PM

## 2012-07-05 NOTE — Progress Notes (Signed)
NUTRITION FOLLOW UP  Intervention:   No nutrition interventions warranted at this time  Nutrition Dx:   Inadequate oral intake now related to decreased appetite as evidenced by 25% meal completion- Resolved   Goal:   PO intake to meet >/=90% estimated nutrition needs.  Met  Monitor:   Po intake, weight trends, labs  Assessment:   Pt planned for d/c tomorrow. Eating well, 100% most meals. States appetite is normal.  Height: Ht Readings from Last 1 Encounters:  06/23/12 6\' 3"  (1.905 m)    Weight Status:   Wt Readings from Last 1 Encounters:  07/05/12 274 lb 0.5 oz (124.3 kg)    Re-estimated needs:  Kcal: 2400-2600 Protein: 100-110 gm  Fluid: 2.4-2.6 L   Skin: intact   Diet Order: Cardiac   Intake/Output Summary (Last 24 hours) at 07/05/12 1457 Last data filed at 07/05/12 1235  Gross per 24 hour  Intake 725.33 ml  Output   3850 ml  Net -3124.67 ml    Last BM: 4/4    Labs:   Recent Labs Lab 06/30/12 0505 07/02/12 0500 07/03/12 0505  NA 141 139 137  K 3.7 3.7 3.8  CL 106 103 101  CO2 27 29 28   BUN 15 15 18   CREATININE 1.29 1.27 1.30  CALCIUM 8.7 8.7 8.9  GLUCOSE 93 86 90    CBG (last 3)  No results found for this basename: GLUCAP,  in the last 72 hours  Scheduled Meds: . amLODipine  5 mg Oral Daily  . enoxaparin (LOVENOX) injection  125 mg Subcutaneous Q12H  . furosemide  40 mg Oral Daily  . gabapentin  600 mg Oral TID  . irbesartan  150 mg Oral Daily  . [START ON 07/06/2012] potassium chloride  40 mEq Oral Daily  . vancomycin  125 mg Oral Q6H  . warfarin  12.5 mg Oral ONCE-1800  . Warfarin - Pharmacist Dosing Inpatient   Does not apply q1800       Clarene Duke RD, LDN Pager [phone] After Hours pager [phone]

## 2012-07-06 DIAGNOSIS — A0472 Enterocolitis due to Clostridium difficile, not specified as recurrent: Secondary | ICD-10-CM

## 2012-07-06 LAB — PROTIME-INR: INR: 2 — ABNORMAL HIGH (ref 0.00–1.49)

## 2012-07-06 MED ORDER — WARFARIN SODIUM 10 MG PO TABS
12.5000 mg | ORAL_TABLET | Freq: Once | ORAL | Status: AC
  Administered 2012-07-06: 12.5 mg via ORAL
  Filled 2012-07-06: qty 1

## 2012-07-06 MED ORDER — WARFARIN SODIUM 5 MG PO TABS: ORAL_TABLET | ORAL | Status: DC

## 2012-07-06 NOTE — Progress Notes (Signed)
ANTICOAGULATION CONSULT NOTE - Follow Up Consult  Pharmacy Consult for Coumadin Indication: pulmonary embolus and DVT  No Known Allergies  Patient Measurements: Height: 6\' 3"  (190.5 cm) Weight: 266 lb 15.6 oz (121.1 kg) IBW/kg (Calculated) : 84.5  Vital Signs: Temp: 98.2 F (36.8 C) (04/08 0518) Temp src: Oral (04/08 0518) BP: 95/60 mmHg (04/08 0518) Pulse Rate: 62 (04/08 0518)  Labs:  Recent Labs  07/04/12 0448 07/05/12 0500 07/06/12 0535  HGB 8.7*  --   --   HCT 26.0*  --   --   PLT 266  --   --   LABPROT 18.1* 19.8* 21.9*  INR 1.55* 1.75* 2.00*    Estimated Creatinine Clearance: 92.1 ml/min (by C-G formula based on Cr of 1.3).   Medications:  Scheduled:  . amLODipine  5 mg Oral Daily  . enoxaparin (LOVENOX) injection  125 mg Subcutaneous Q12H  . furosemide  40 mg Oral Daily  . gabapentin  600 mg Oral TID  . irbesartan  150 mg Oral Daily  . potassium chloride  40 mEq Oral Daily  . vancomycin  125 mg Oral Q6H  . [COMPLETED] warfarin  12.5 mg Oral ONCE-1800  . Warfarin - Pharmacist Dosing Inpatient   Does not apply q1800  . [DISCONTINUED] enoxaparin (LOVENOX) injection  1 mg/kg Subcutaneous Q12H  . [DISCONTINUED] enoxaparin (LOVENOX) injection  125 mg/kg Subcutaneous Q12H  . [DISCONTINUED] metroNIDAZOLE  500 mg Oral Q8H  . [DISCONTINUED] polyethylene glycol  17 g Oral Daily  . [DISCONTINUED] potassium chloride  30 mEq Oral Q12H  . [DISCONTINUED] sodium chloride  1,000 mL Intravenous Once    Assessment: 52yo male with (+)RLE DVT and presumed PE on day #7 overlap of Lovenox and Coumadin.  INR is increasing nicely to low-end goal.  Lovenox should continue for another 24hr.  No bleeding problems noted. Patient is likely d/c home today.   Goal of Therapy:  INR 2-3 Monitor platelets by anticoagulation protocol: Yes   Plan:  1. Coumadin 12.5mg  2. Continue Lovenox to 125mg  sq Q 12hrs for 24 hrs and d/c lovenox if INR > 2 tomorrow Am 3. If discharge today  recommend 12.5mg  daily five days a week and 10mg  daily two days a week. Check INR before this Thursday or Friday  Bayard Hugger, PharmD, BCPS  Clinical Pharmacist  Pager: [phone]

## 2012-07-06 NOTE — Progress Notes (Signed)
Matthew Juarez to be D/C'd Home per MD order.  Discussed with the patient and all questions fully answered.    Medication List    TAKE these medications       allopurinol 300 MG tablet  Commonly known as:  ZYLOPRIM  Take 1 tablet (300 mg total) by mouth daily.     AZOR 5-20 MG per tablet  Generic drug:  amLODipine-olmesartan  Take 1 tablet by mouth daily.     Co Q-10 200 MG Caps  Take 1 capsule by mouth daily.     esomeprazole 20 MG capsule  Commonly known as:  NEXIUM  Take 20 mg by mouth daily before breakfast.     gabapentin 300 MG capsule  Commonly known as:  NEURONTIN  Take 600 mg by mouth 3 (three) times daily.     HYDROcodone-acetaminophen 5-325 MG per tablet  Commonly known as:  NORCO/VICODIN  Take 1-2 tablets by mouth every 4 (four) hours as needed for pain.     NONFORMULARY OR COMPOUNDED ITEM  2 each by Other route daily.     vancomycin 50 mg/mL oral solution  Commonly known as:  VANCOCIN  Take 2.5 mLs (125 mg total) by mouth every 6 (six) hours.     Vitamin D 2000 UNITS Caps  Take 2,000 Units by mouth daily.     warfarin 5 MG tablet  Commonly known as:  COUMADIN  Take 12.5mg  (2.5 tabs) every Monday-fridays, take 10mg  sat and Sunday (2 tabs)--.adjsut as directed        VVS, Skin clean, dry and intact without evidence of skin break down, no evidence of skin tears noted. IV catheter discontinued intact. Site without signs and symptoms of complications. Dressing and pressure applied.  An After Visit Summary was printed and given to the patient. Patient escorted via WC, and D/C home via private auto with wife.  [name] D 07/06/2012 2:45 PM

## 2012-07-20 LAB — RHEUMATOID FACTORS, FLUID: Cortisol #1 (Base): 145

## 2012-07-23 ENCOUNTER — Encounter: Payer: Self-pay | Admitting: Emergency Medicine

## 2012-07-23 ENCOUNTER — Ambulatory Visit (INDEPENDENT_AMBULATORY_CARE_PROVIDER_SITE_OTHER)
Admission: RE | Admit: 2012-07-23 | Discharge: 2012-07-23 | Disposition: A | Discharge status: Home / Self Care | Payer: BC Managed Care – PPO | Source: Ambulatory Visit | Attending: Emergency Medicine | Admitting: Emergency Medicine

## 2012-07-23 ENCOUNTER — Ambulatory Visit (INDEPENDENT_AMBULATORY_CARE_PROVIDER_SITE_OTHER): Payer: BC Managed Care – PPO | Admitting: Emergency Medicine

## 2012-07-23 VITALS — BP 100/66 | HR 78 | Temp 98.7°F | Ht 77.0 in

## 2012-07-23 DIAGNOSIS — J90 Pleural effusion, not elsewhere classified: Secondary | ICD-10-CM

## 2012-07-23 DIAGNOSIS — I2699 Other pulmonary embolism without acute cor pulmonale: Secondary | ICD-10-CM

## 2012-07-23 DIAGNOSIS — Z86711 Personal history of pulmonary embolism: Secondary | ICD-10-CM | POA: Insufficient documentation

## 2012-07-23 HISTORY — DX: Other pulmonary embolism without acute cor pulmonale: I26.99

## 2012-07-23 NOTE — Assessment & Plan Note (Signed)
R effusion post PE and lytics, drained.  - recheck CXR to insure no re-accumulation; exam is reassuring

## 2012-07-23 NOTE — Assessment & Plan Note (Signed)
With associated R heart failure and cardiac arrest.  - you could make an argument to treat him with lifelong coumadin given the severity of his PE, although the literature supports 12 months.  - he will be treated (at least) 12 months, to be followed by Dr Renae Gloss.  - will need hypercoag w/u a month after the coumadin is stopped - restart coumadin if at any increased risk recurrence - either by labs or due to immobility, etc.  - will need repeat TTE to eval R heart fxn next year.

## 2012-07-23 NOTE — Progress Notes (Signed)
HPI:  52 y/o recently admitted with cervicothoracic transverse myelitis, R distal femur fracture and R DVT, then admitted again 06/23/12 after suffering PEA arrest w/ 17 minutes CPR, Secondary to suspected PE. He was intubated and placed on the mechanical vent. Admitting echo showed: severe RV dysfunction / dilation. Received TNKase 50mg  on admit and subsequently started on heparin gtt. Post arrest was in cardiogenic shock from right heart failure. Was supported w/ vasopressor support in addition to volume resuscitation. Follow up eval demonstrated: Venous Doppler suggestive of R DVT. 2D echo reports severe RV dysfunction / dilation. CT-PA was deferred due to AKI.   His hospital course was complicated by MSSA pneumonia which was likely an aspiration event. He was treated with a full course of antibiotics. He had dramatic cardio-pulmonary response to TNK and heparin. His shock rapidly resolved and we were able to wean him off the vent and extubate on 3/29. He did have some excess bleeding and heparin was held for 24 hrs due to hematuria and and 2 gm drop in hgb. Restarted on 3/31 as hematuria resolved and hgb stabilized. He further was noted to have a right pleural effusion and underwent thoracentesis on 4/3 with 1500 ml bloody fluid removed / exudative effusion. CXR further cleared to minimal atelectasis on 4/7. 4/5 he was noted to have loose stools and was noted to have C-Diff positive PCR. Treated with flagyl and changed to PO vancomycin 4/7 for completion of treatment.  He completed PO vanco on 4/19. His coumadin level is being followed by Dr Renae Gloss, and he is planned to be on rx for . He is able to walk with a walker, is doing PT. Denies dyspnea, cough, hemoptysis, syncope. His R LE is still swollen post-DVT.   Filed Vitals:   07/23/12 1517  BP: 100/66  Pulse: 78  Temp: 98.7 F (37.1 C)  TempSrc: Oral  Height: 6\' 5"  (1.956 m)  SpO2: 97%   Gen: Pleasant, overwt man in wheelchair, in no distress,   normal affect  ENT: No lesions,  mouth clear,  oropharynx clear, no postnasal drip  Neck: No JVD, no TMG, no carotid bruits  Lungs: No use of accessory muscles, clear without rales or rhonchi  Cardiovascular: RRR, heart sounds normal, no murmur or gallops, 1+ peripheral edema R LE, L LE normal, B TED hose in place.   Musculoskeletal: No deformities, no cyanosis or clubbing  Neuro: alert, non focal  Skin:  papular raised rash on R mid-back, no warmth or erythema  Pleural effusion R effusion post PE and lytics, drained.  - recheck CXR to insure no re-accumulation; exam is reassuring  Pulmonary embolism With associated R heart failure and cardiac arrest.  - you could make an argument to treat him with lifelong coumadin given the severity of his PE, although the literature supports 12 months.  - he will be treated (at least) 12 months, to be followed by Dr Renae Gloss.  - will need hypercoag w/u a month after the coumadin is stopped - restart coumadin if at any increased risk recurrence - either by labs or due to immobility, etc.  - will need repeat TTE to eval R heart fxn next year.

## 2012-07-23 NOTE — Progress Notes (Signed)
  Subjective:    Patient ID: Matthew Juarez, male    DOB: [birth year], 52 y.o.   MRN: [id number]  HPI    Review of Systems  Constitutional: Negative for fever and unexpected weight change.  HENT: Negative for ear pain, nosebleeds, congestion, sore throat, rhinorrhea, sneezing, trouble swallowing, dental problem, postnasal drip and sinus pressure.   Eyes: Negative for redness and itching.  Respiratory: Negative for cough, chest tightness, shortness of breath and wheezing.   Cardiovascular: Positive for leg swelling. Negative for palpitations.  Gastrointestinal: Negative for nausea and vomiting.  Genitourinary: Negative for dysuria.  Musculoskeletal: Negative for joint swelling.  Skin: Negative for rash.  Neurological: Negative for headaches.  Hematological: Does not bruise/bleed easily.  Psychiatric/Behavioral: Negative for dysphoric mood. The patient is not nervous/anxious.        Objective:   Physical Exam        Assessment & Plan:

## 2012-07-23 NOTE — Patient Instructions (Addendum)
CXR today You will need to be on coumadin for a year. Follow with Dr Renae Gloss to insure proper dosing. After a year you can stop the coumadin and then do blood work with Dr Renae Gloss to see if you are at increased risk for recurrent blood clots. If so, then she may decide to restart the coumadin and to continue indefinitely.  You will need a repeat echocardiogram next year.  You may try using OTC hydrocortisone cream to treat your skin rash on your back. Notify Dr Renae Gloss if not improving.  You may want to consider having a formal Home Health Assessment to see if there is any other equipment you might need to help with your recovery and rehab.  Call or follow with Dr Delton Coombes if you have any problems or questions.

## 2012-07-23 NOTE — Progress Notes (Signed)
Quick Note:  ATC patient, no answer [id number] ______ 

## 2012-07-26 NOTE — Progress Notes (Signed)
Quick Note:  Spoke with patient, informed him odf results as listed below. Patient verbalized understanding and nothing further needed at this time. ______

## 2012-09-10 ENCOUNTER — Ambulatory Visit: Payer: BC Managed Care – PPO | Admitting: *Deleted

## 2012-10-07 ENCOUNTER — Ambulatory Visit
Payer: BC Managed Care – PPO | Attending: Internal Medicine | Admitting: Rehabilitative and Restorative Service Providers"

## 2012-10-07 DIAGNOSIS — Z9181 History of falling: Secondary | ICD-10-CM | POA: Insufficient documentation

## 2012-10-07 DIAGNOSIS — R269 Unspecified abnormalities of gait and mobility: Secondary | ICD-10-CM | POA: Insufficient documentation

## 2012-10-07 DIAGNOSIS — IMO0001 Reserved for inherently not codable concepts without codable children: Secondary | ICD-10-CM | POA: Insufficient documentation

## 2012-10-07 DIAGNOSIS — M6281 Muscle weakness (generalized): Secondary | ICD-10-CM | POA: Insufficient documentation

## 2012-10-12 ENCOUNTER — Ambulatory Visit: Payer: BC Managed Care – PPO | Admitting: Physical Therapy

## 2012-10-14 ENCOUNTER — Ambulatory Visit: Payer: BC Managed Care – PPO | Admitting: Physical Therapy

## 2012-10-26 ENCOUNTER — Ambulatory Visit: Payer: BC Managed Care – PPO | Admitting: Physical Therapy

## 2012-10-28 ENCOUNTER — Ambulatory Visit: Payer: BC Managed Care – PPO | Admitting: Physical Therapy

## 2012-10-29 ENCOUNTER — Ambulatory Visit: Payer: BC Managed Care – PPO | Attending: Internal Medicine | Admitting: Physical Therapy

## 2012-10-29 DIAGNOSIS — IMO0001 Reserved for inherently not codable concepts without codable children: Secondary | ICD-10-CM | POA: Insufficient documentation

## 2012-10-29 DIAGNOSIS — Z9181 History of falling: Secondary | ICD-10-CM | POA: Insufficient documentation

## 2012-10-29 DIAGNOSIS — M6281 Muscle weakness (generalized): Secondary | ICD-10-CM | POA: Insufficient documentation

## 2012-10-29 DIAGNOSIS — R269 Unspecified abnormalities of gait and mobility: Secondary | ICD-10-CM | POA: Insufficient documentation

## 2012-11-02 ENCOUNTER — Ambulatory Visit: Payer: BC Managed Care – PPO | Admitting: Physical Therapy

## 2012-11-04 ENCOUNTER — Ambulatory Visit: Payer: BC Managed Care – PPO | Admitting: Rehabilitative and Restorative Service Providers"

## 2012-11-09 ENCOUNTER — Ambulatory Visit: Payer: BC Managed Care – PPO | Admitting: Physical Therapy

## 2012-11-11 ENCOUNTER — Ambulatory Visit: Payer: BC Managed Care – PPO | Admitting: Physical Therapy

## 2012-11-15 ENCOUNTER — Ambulatory Visit: Payer: BC Managed Care – PPO | Admitting: Physical Therapy

## 2012-11-18 ENCOUNTER — Ambulatory Visit: Payer: BC Managed Care – PPO | Admitting: Physical Therapy

## 2012-11-22 ENCOUNTER — Ambulatory Visit: Payer: BC Managed Care – PPO | Admitting: Physical Therapy

## 2012-11-24 ENCOUNTER — Ambulatory Visit: Payer: BC Managed Care – PPO | Admitting: Physical Therapy

## 2012-11-30 ENCOUNTER — Ambulatory Visit: Payer: BC Managed Care – PPO | Admitting: Rehabilitative and Restorative Service Providers"

## 2012-12-06 ENCOUNTER — Ambulatory Visit: Payer: BC Managed Care – PPO | Admitting: Physical Therapy

## 2012-12-08 ENCOUNTER — Ambulatory Visit: Payer: BC Managed Care – PPO | Admitting: Physical Therapy

## 2012-12-29 ENCOUNTER — Ambulatory Visit
Payer: BC Managed Care – PPO | Attending: Internal Medicine | Admitting: Rehabilitative and Restorative Service Providers"

## 2012-12-29 DIAGNOSIS — IMO0001 Reserved for inherently not codable concepts without codable children: Secondary | ICD-10-CM | POA: Insufficient documentation

## 2012-12-29 DIAGNOSIS — M6281 Muscle weakness (generalized): Secondary | ICD-10-CM | POA: Insufficient documentation

## 2012-12-29 DIAGNOSIS — Z9181 History of falling: Secondary | ICD-10-CM | POA: Insufficient documentation

## 2012-12-29 DIAGNOSIS — R269 Unspecified abnormalities of gait and mobility: Secondary | ICD-10-CM | POA: Insufficient documentation

## 2013-01-03 ENCOUNTER — Ambulatory Visit: Payer: BC Managed Care – PPO | Admitting: Rehabilitative and Restorative Service Providers"

## 2013-01-05 ENCOUNTER — Ambulatory Visit: Payer: BC Managed Care – PPO | Admitting: Physical Therapy

## 2013-01-11 ENCOUNTER — Ambulatory Visit: Payer: BC Managed Care – PPO | Admitting: Rehabilitative and Restorative Service Providers"

## 2013-01-14 ENCOUNTER — Ambulatory Visit: Payer: BC Managed Care – PPO

## 2013-01-17 ENCOUNTER — Ambulatory Visit: Payer: BC Managed Care – PPO | Admitting: Physical Therapy

## 2013-01-19 ENCOUNTER — Ambulatory Visit: Payer: BC Managed Care – PPO

## 2013-01-24 ENCOUNTER — Ambulatory Visit: Payer: BC Managed Care – PPO | Admitting: Physical Therapy

## 2013-01-28 ENCOUNTER — Ambulatory Visit: Payer: BC Managed Care – PPO | Admitting: Rehabilitative and Restorative Service Providers"

## 2013-02-03 ENCOUNTER — Ambulatory Visit: Payer: BC Managed Care – PPO | Admitting: Rehabilitative and Restorative Service Providers"

## 2013-02-03 DIAGNOSIS — IMO0001 Reserved for inherently not codable concepts without codable children: Secondary | ICD-10-CM | POA: Insufficient documentation

## 2013-02-03 DIAGNOSIS — Z9181 History of falling: Secondary | ICD-10-CM | POA: Insufficient documentation

## 2013-02-03 DIAGNOSIS — R269 Unspecified abnormalities of gait and mobility: Secondary | ICD-10-CM | POA: Insufficient documentation

## 2013-02-03 DIAGNOSIS — M6281 Muscle weakness (generalized): Secondary | ICD-10-CM | POA: Insufficient documentation

## 2013-02-04 ENCOUNTER — Ambulatory Visit: Payer: BC Managed Care – PPO | Admitting: Rehabilitative and Restorative Service Providers"

## 2013-02-09 ENCOUNTER — Ambulatory Visit: Payer: BC Managed Care – PPO | Attending: Internal Medicine | Admitting: Physical Therapy

## 2013-02-10 ENCOUNTER — Ambulatory Visit: Payer: BC Managed Care – PPO | Admitting: Podiatry

## 2013-02-11 ENCOUNTER — Ambulatory Visit: Payer: BC Managed Care – PPO | Admitting: Rehabilitative and Restorative Service Providers"

## 2013-02-16 ENCOUNTER — Ambulatory Visit: Payer: BC Managed Care – PPO | Admitting: Physical Therapy

## 2013-02-18 ENCOUNTER — Ambulatory Visit: Payer: BC Managed Care – PPO | Admitting: Physical Therapy

## 2013-02-21 ENCOUNTER — Ambulatory Visit: Payer: BC Managed Care – PPO | Admitting: Rehabilitative and Restorative Service Providers"

## 2013-02-23 ENCOUNTER — Ambulatory Visit: Payer: BC Managed Care – PPO | Admitting: Physical Therapy

## 2013-03-04 ENCOUNTER — Ambulatory Visit: Payer: BC Managed Care – PPO | Attending: Internal Medicine | Admitting: Physical Therapy

## 2013-03-04 DIAGNOSIS — R269 Unspecified abnormalities of gait and mobility: Secondary | ICD-10-CM | POA: Insufficient documentation

## 2013-03-04 DIAGNOSIS — Z9181 History of falling: Secondary | ICD-10-CM | POA: Insufficient documentation

## 2013-03-04 DIAGNOSIS — M6281 Muscle weakness (generalized): Secondary | ICD-10-CM | POA: Insufficient documentation

## 2013-03-04 DIAGNOSIS — IMO0001 Reserved for inherently not codable concepts without codable children: Secondary | ICD-10-CM | POA: Insufficient documentation

## 2013-03-07 ENCOUNTER — Ambulatory Visit: Payer: BC Managed Care – PPO | Admitting: Physical Therapy

## 2013-03-11 ENCOUNTER — Ambulatory Visit: Payer: BC Managed Care – PPO

## 2013-03-15 ENCOUNTER — Ambulatory Visit: Payer: BC Managed Care – PPO | Admitting: Rehabilitative and Restorative Service Providers"

## 2013-03-18 ENCOUNTER — Ambulatory Visit: Payer: BC Managed Care – PPO | Admitting: Physical Therapy

## 2013-03-21 ENCOUNTER — Ambulatory Visit: Payer: BC Managed Care – PPO | Admitting: Rehabilitative and Restorative Service Providers"

## 2013-03-22 ENCOUNTER — Ambulatory Visit: Payer: BC Managed Care – PPO | Admitting: Rehabilitative and Restorative Service Providers"

## 2013-03-29 ENCOUNTER — Ambulatory Visit: Payer: BC Managed Care – PPO | Admitting: Physical Therapy

## 2013-03-30 ENCOUNTER — Ambulatory Visit: Payer: BC Managed Care – PPO | Admitting: Rehabilitative and Restorative Service Providers"

## 2013-04-06 ENCOUNTER — Ambulatory Visit
Payer: BC Managed Care – PPO | Attending: Internal Medicine | Admitting: Rehabilitative and Restorative Service Providers"

## 2013-04-06 DIAGNOSIS — R269 Unspecified abnormalities of gait and mobility: Secondary | ICD-10-CM | POA: Insufficient documentation

## 2013-04-06 DIAGNOSIS — M6281 Muscle weakness (generalized): Secondary | ICD-10-CM | POA: Insufficient documentation

## 2013-04-06 DIAGNOSIS — IMO0001 Reserved for inherently not codable concepts without codable children: Secondary | ICD-10-CM | POA: Insufficient documentation

## 2013-04-06 DIAGNOSIS — Z9181 History of falling: Secondary | ICD-10-CM | POA: Insufficient documentation

## 2013-04-08 ENCOUNTER — Ambulatory Visit: Payer: BC Managed Care – PPO | Admitting: Physical Therapy

## 2013-04-12 ENCOUNTER — Ambulatory Visit: Payer: BC Managed Care – PPO | Admitting: Rehabilitative and Restorative Service Providers"

## 2013-04-15 ENCOUNTER — Ambulatory Visit: Payer: BC Managed Care – PPO | Admitting: Rehabilitative and Restorative Service Providers"

## 2013-04-19 ENCOUNTER — Ambulatory Visit: Payer: BC Managed Care – PPO | Admitting: Rehabilitative and Restorative Service Providers"

## 2013-04-22 ENCOUNTER — Ambulatory Visit: Payer: BC Managed Care – PPO | Admitting: Rehabilitative and Restorative Service Providers"

## 2013-04-26 ENCOUNTER — Ambulatory Visit: Payer: BC Managed Care – PPO | Admitting: Rehabilitative and Restorative Service Providers"

## 2013-04-28 ENCOUNTER — Ambulatory Visit: Payer: BC Managed Care – PPO

## 2013-04-29 ENCOUNTER — Ambulatory Visit: Payer: BC Managed Care – PPO | Admitting: Physical Therapy

## 2013-05-05 DIAGNOSIS — F1190 Opioid use, unspecified, uncomplicated: Secondary | ICD-10-CM | POA: Insufficient documentation

## 2013-05-10 ENCOUNTER — Ambulatory Visit: Payer: BC Managed Care – PPO | Attending: Internal Medicine

## 2013-05-10 DIAGNOSIS — R269 Unspecified abnormalities of gait and mobility: Secondary | ICD-10-CM | POA: Insufficient documentation

## 2013-05-10 DIAGNOSIS — IMO0001 Reserved for inherently not codable concepts without codable children: Secondary | ICD-10-CM | POA: Insufficient documentation

## 2013-05-10 DIAGNOSIS — Z9181 History of falling: Secondary | ICD-10-CM | POA: Insufficient documentation

## 2013-05-10 DIAGNOSIS — M6281 Muscle weakness (generalized): Secondary | ICD-10-CM | POA: Insufficient documentation

## 2013-09-08 ENCOUNTER — Ambulatory Visit: Payer: BC Managed Care – PPO | Admitting: Physical Therapy

## 2013-10-06 ENCOUNTER — Ambulatory Visit: Payer: BC Managed Care – PPO | Attending: Internal Medicine | Admitting: Physical Therapy

## 2013-10-06 DIAGNOSIS — IMO0001 Reserved for inherently not codable concepts without codable children: Secondary | ICD-10-CM | POA: Insufficient documentation

## 2013-10-06 DIAGNOSIS — M6281 Muscle weakness (generalized): Secondary | ICD-10-CM | POA: Insufficient documentation

## 2013-10-06 DIAGNOSIS — Z993 Dependence on wheelchair: Secondary | ICD-10-CM | POA: Insufficient documentation

## 2013-10-06 DIAGNOSIS — G360 Neuromyelitis optica [Devic]: Secondary | ICD-10-CM | POA: Diagnosis not present

## 2013-10-20 ENCOUNTER — Ambulatory Visit: Payer: BC Managed Care – PPO | Admitting: Physical Therapy

## 2013-10-20 DIAGNOSIS — IMO0001 Reserved for inherently not codable concepts without codable children: Secondary | ICD-10-CM | POA: Diagnosis not present

## 2013-11-17 ENCOUNTER — Encounter: Payer: Self-pay | Admitting: Podiatry

## 2013-11-17 ENCOUNTER — Ambulatory Visit (INDEPENDENT_AMBULATORY_CARE_PROVIDER_SITE_OTHER): Payer: BC Managed Care – PPO | Admitting: Podiatry

## 2013-11-17 VITALS — BP 144/85 | HR 77 | Resp 16

## 2013-11-17 DIAGNOSIS — L600 Ingrowing nail: Secondary | ICD-10-CM

## 2013-11-17 DIAGNOSIS — M2040 Other hammer toe(s) (acquired), unspecified foot: Secondary | ICD-10-CM

## 2013-11-17 NOTE — Patient Instructions (Signed)

## 2013-11-17 NOTE — Progress Notes (Signed)
Subjective:     Patient ID: Matthew Juarez, male   DOB: 12/18/1960, 53 y.o.   MRN: [id number]  Toe Pain    patient presents with a painful ingrown toenail of his right big toe and also complaining of hammertoe deformity right foot over left foot that makes shoe gear difficult. He is on xaralto as he had a pulmonary embolism last year and he has resolved completely from this after having had a DVT   Review of Systems  All other systems reviewed and are negative.      Objective:   Physical Exam  Nursing note and vitals reviewed. Constitutional: He is oriented to person, place, and time.  Cardiovascular: Intact distal pulses.   Musculoskeletal: Normal range of motion.  Neurological: He is oriented to person, place, and time.  Skin: Skin is warm and dry.   neurovascular status found to be intact muscle strength was diminished but he is in a wheelchair for most of the day due to back problems and is noted to have an incurvated nail right hallux lateral border with pain and elevated second third and fourth toes right over left with dorsal keratotic lesions that are painful when pressed.     Assessment:     Ingrown toenail deformity right hallux along with digital deformity of the lesser toes with rigid contracture    Plan:     H&P discussed were focusing on the ingrown toenail today. I did educate him on a hammertoes and he returns we will do x-rays today I explained risk of correction of the ingrown which she wants done and I infiltrated the right hallux 60 mg I can Marcaine mixture remove the lateral border exposed matrix and apply chemical phenol 3 applications 30 seconds followed by alcohol lavaged and sterile dressing. Gave instructions on soaks and reappoint 2 weeks

## 2013-12-01 ENCOUNTER — Ambulatory Visit: Payer: BC Managed Care – PPO | Admitting: Podiatry

## 2013-12-07 ENCOUNTER — Ambulatory Visit (INDEPENDENT_AMBULATORY_CARE_PROVIDER_SITE_OTHER): Payer: BC Managed Care – PPO | Admitting: Podiatry

## 2013-12-07 ENCOUNTER — Encounter: Payer: Self-pay | Admitting: Podiatry

## 2013-12-07 ENCOUNTER — Ambulatory Visit (INDEPENDENT_AMBULATORY_CARE_PROVIDER_SITE_OTHER): Payer: BC Managed Care – PPO

## 2013-12-07 VITALS — BP 144/85 | HR 77 | Resp 16

## 2013-12-07 DIAGNOSIS — M2040 Other hammer toe(s) (acquired), unspecified foot: Secondary | ICD-10-CM

## 2013-12-08 NOTE — Progress Notes (Signed)
Subjective:     Patient ID: Matthew Juarez, male   DOB: 1960/12/19, 53 y.o.   MRN: [id number]  HPI patient presents stating that his nail is doing very well and healing well and is concerned about his hammertoe deformities on both feet stating that at times when he wears his brace as they can be irritating   Review of Systems     Objective:   Physical Exam Neurovascular status unchanged with well-healing nail site right hallux lateral border and digital deformity digits 234 of both feet with rigid contracture noted and distal keratotic lesions of a mild nature    Assessment:     Structural deformity of his feet with patient having a difficult condition secondary to his history of blood clots and taking blood thinner    Plan:     H&P and x-rays reviewed. We discussed that this is difficult fracture and I would rather not do this and less significant symptoms were to develop and at this time we try cushions on the toes and we will see how this does and if it is not improved we'll need to consider other treatment options

## 2014-02-10 ENCOUNTER — Telehealth: Payer: Self-pay | Admitting: *Deleted

## 2014-02-10 NOTE — Telephone Encounter (Signed)
I was just calling, I had an ingrown toenail removed by Dr. Charlsie Merlesegal in August.  This is what November, old booger act like it don't want to heal.  I want to know what can I do for it to heal without coming in to see him.  Maybe you can tell me what to do.  I been soaking it and letting it air out.  If you would give me a call, I'd appreciate it.  Thank you.  I called and left him a message to call and schedule an appointment.  We will need to see him for an appointment.

## 2014-02-10 NOTE — Telephone Encounter (Signed)
"  I'm returning the nurse call.  This is about my toe.  If you give me a call back I'd appreciate it.  Thank you."

## 2014-02-16 ENCOUNTER — Encounter: Payer: Self-pay | Admitting: Podiatry

## 2014-02-16 ENCOUNTER — Ambulatory Visit (INDEPENDENT_AMBULATORY_CARE_PROVIDER_SITE_OTHER): Payer: BC Managed Care – PPO | Admitting: Podiatry

## 2014-02-16 VITALS — BP 172/91 | HR 65 | Resp 16

## 2014-02-16 DIAGNOSIS — L03031 Cellulitis of right toe: Secondary | ICD-10-CM

## 2014-02-16 NOTE — Progress Notes (Signed)
Subjective:     Patient ID: Matthew Juarez, male   DOB: 12/21/1960, 53 y.o.   MRN: [id number]  HPI patient presents stating I was concerned because I was getting some burning and irritation on the big toe right and I wanted to make sure it was healing okay   Review of Systems     Objective:   Physical Exam Neurovascular status unchanged with crusted tissue on the right hallux lateral border that's localized with minimal drainage and no edema erythema    Assessment:     Mild paronychia infection right big toe    Plan:     Debrided some tissue with no active drainage and instructed on stopping Betadine which may be causing irritation and switching to either Epsom salts or bacterial soap

## 2014-11-16 ENCOUNTER — Emergency Department (HOSPITAL_COMMUNITY): Payer: BC Managed Care – PPO

## 2014-11-16 ENCOUNTER — Emergency Department (HOSPITAL_COMMUNITY)
Admission: EM | Admit: 2014-11-16 | Discharge: 2014-11-16 | Disposition: A | Discharge status: Home / Self Care | Payer: BC Managed Care – PPO | Attending: Emergency Medicine | Admitting: Emergency Medicine

## 2014-11-16 ENCOUNTER — Encounter (HOSPITAL_COMMUNITY): Payer: Self-pay | Admitting: Cardiology

## 2014-11-16 DIAGNOSIS — Z87891 Personal history of nicotine dependence: Secondary | ICD-10-CM | POA: Diagnosis not present

## 2014-11-16 DIAGNOSIS — I10 Essential (primary) hypertension: Secondary | ICD-10-CM | POA: Insufficient documentation

## 2014-11-16 DIAGNOSIS — M109 Gout, unspecified: Secondary | ICD-10-CM | POA: Diagnosis not present

## 2014-11-16 DIAGNOSIS — R7989 Other specified abnormal findings of blood chemistry: Secondary | ICD-10-CM

## 2014-11-16 DIAGNOSIS — R509 Fever, unspecified: Secondary | ICD-10-CM | POA: Diagnosis present

## 2014-11-16 DIAGNOSIS — R911 Solitary pulmonary nodule: Secondary | ICD-10-CM | POA: Diagnosis not present

## 2014-11-16 DIAGNOSIS — Z79899 Other long term (current) drug therapy: Secondary | ICD-10-CM | POA: Insufficient documentation

## 2014-11-16 DIAGNOSIS — G8929 Other chronic pain: Secondary | ICD-10-CM | POA: Diagnosis not present

## 2014-11-16 LAB — URINALYSIS, ROUTINE W REFLEX MICROSCOPIC
Bilirubin Urine: NEGATIVE
GLUCOSE, UA: NEGATIVE mg/dL
Hgb urine dipstick: NEGATIVE
KETONES UR: NEGATIVE mg/dL
LEUKOCYTES UA: NEGATIVE
NITRITE: NEGATIVE
PH: 6 (ref 5.0–8.0)
Protein, ur: NEGATIVE mg/dL
SPECIFIC GRAVITY, URINE: 1.023 (ref 1.005–1.030)
Urobilinogen, UA: 4 mg/dL — ABNORMAL HIGH (ref 0.0–1.0)

## 2014-11-16 LAB — COMPREHENSIVE METABOLIC PANEL
ALT: 23 U/L (ref 17–63)
ANION GAP: 9 (ref 5–15)
AST: 23 U/L (ref 15–41)
Albumin: 3.9 g/dL (ref 3.5–5.0)
Alkaline Phosphatase: 68 U/L (ref 38–126)
BUN: 15 mg/dL (ref 6–20)
CALCIUM: 9.3 mg/dL (ref 8.9–10.3)
CHLORIDE: 104 mmol/L (ref 101–111)
CO2: 25 mmol/L (ref 22–32)
Creatinine, Ser: 1.8 mg/dL — ABNORMAL HIGH (ref 0.61–1.24)
GFR, EST AFRICAN AMERICAN: 47 mL/min — AB (ref >60)
GFR, EST NON AFRICAN AMERICAN: 40 mL/min — AB (ref >60)
Glucose, Bld: 112 mg/dL — ABNORMAL HIGH (ref 65–99)
Potassium: 4 mmol/L (ref 3.5–5.1)
SODIUM: 138 mmol/L (ref 135–145)
Total Bilirubin: 2.2 mg/dL — ABNORMAL HIGH (ref 0.3–1.2)
Total Protein: 6.2 g/dL — ABNORMAL LOW (ref 6.5–8.1)

## 2014-11-16 LAB — CBC
HCT: 42.9 % (ref 39.0–52.0)
HEMOGLOBIN: 14.3 g/dL (ref 13.0–17.0)
MCH: 28.9 pg (ref 26.0–34.0)
MCHC: 33.3 g/dL (ref 30.0–36.0)
MCV: 86.8 fL (ref 78.0–100.0)
PLATELETS: 160 10*3/uL (ref 150–400)
RBC: 4.94 MIL/uL (ref 4.22–5.81)
RDW: 13.1 % (ref 11.5–15.5)
WBC: 9.6 10*3/uL (ref 4.0–10.5)

## 2014-11-16 MED ORDER — SODIUM CHLORIDE 0.9 % IV BOLUS (SEPSIS)
1000.0000 mL | Freq: Once | INTRAVENOUS | Status: AC
  Administered 2014-11-16: 1000 mL via INTRAVENOUS

## 2014-11-16 MED ORDER — ACETAMINOPHEN 325 MG PO TABS
650.0000 mg | ORAL_TABLET | Freq: Once | ORAL | Status: AC
  Administered 2014-11-16: 650 mg via ORAL
  Filled 2014-11-16: qty 2

## 2014-11-16 NOTE — ED Notes (Signed)
Pt has some feeling in his legs and primarily uses a wheelchair.  Lab drawing cultures currently and patient aware of need for urine sample

## 2014-11-16 NOTE — ED Notes (Signed)
Pt reports that for the past couple of days that he has not had any appetite and had a fever this morning of 100.4. Called his PCP and told to come to the ED for evaluation.

## 2014-11-16 NOTE — ED Notes (Signed)
Pt temp 98.3, however states he took 500 mg tylenol at 8:30 this morning per PCP recommendation. Pt denies nausea/vomiting, diarrhea, cough, cold symptoms, etc. Does report fatigue. Recently diagnosed with neuromyelitis optica.

## 2014-11-16 NOTE — Discharge Instructions (Signed)
There is no obvious source of your fever. If you develop any new signs or symptoms call your doctor or return to the ER. Your Creatinine (measure of your kidney function) is elevated from baseline (is 1.8 instead of 0.9). Your doctor will need to recheck this in about 1 week. The chest xray shows a lung nodule (spot). You need a CT scan of your chest, have your primary care physician order this for you.

## 2014-11-16 NOTE — ED Notes (Signed)
Pt verbalizes understanding of discharge instructions. Pt is a/o x4. VSS on departure, 650 mg of tylenol given for temp. Pt is departing ED in personal wheelchair in with family. NAD.

## 2014-11-16 NOTE — ED Provider Notes (Signed)
CSN: [id number]     Arrival date & time 11/16/14  1054 History   First MD Initiated Contact with Patient 11/16/14 1127     Chief Complaint  Patient presents with  . Fever     (Consider location/radiation/quality/duration/timing/severity/associated sxs/prior Treatment) HPI  54 year old male presents with fever since last night. Patient's maximum temperature was 100.4 both last night and this morning. He has felt warm like he has a fever but denies any other symptoms. Denies headache, blurry vision, cough, chest pain, shortness of breath, sore throat, vomiting, diarrhea, abdominal pain, or urinary symptoms. He has a history of neuromyelitis optica and gets treatments of rituximab every few months. Last treatment was in May, next treatment is next month. He is followed at [facility] by Dr. Denice Bors. Called them and recommended to come to ED. Took tylenol prior to arrival.  Past Medical History  Diagnosis Date  . Chronic back pain   . Hypertension   . Gout   . Headache(784.0)   . Arthritis    History reviewed. No pertinent past surgical history. Family History  Problem Relation Age of Onset  . Lung cancer Maternal Uncle   . Colon cancer Maternal Aunt    Social History  Substance Use Topics  . Smoking status: Former Smoker -- 1 years    Types: Cigarettes    Quit date: 04/01/1983  . Smokeless tobacco: Never Used     Comment: only snmoke 2- cigsper day when he smoked  . Alcohol Use: Yes    Review of Systems  Constitutional: Positive for fever.  HENT: Negative for congestion and sore throat.   Eyes: Negative for visual disturbance.  Respiratory: Negative for cough and shortness of breath.   Gastrointestinal: Negative for vomiting, abdominal pain and diarrhea.  Genitourinary: Negative for dysuria.  Neurological: Negative for headaches.  All other systems reviewed and are negative.     Allergies  Review of patient's allergies indicates no known allergies.  Home  Medications   Prior to Admission medications   Medication Sig Start Date End Date Taking? Authorizing Provider  acetaminophen (TYLENOL) 500 MG tablet Take 500 mg by mouth every 6 (six) hours as needed for mild pain.   Yes Historical Provider, MD  allopurinol (ZYLOPRIM) 300 MG tablet Take 1 tablet (300 mg total) by mouth daily. 05/11/12  Yes Daniel J Angiulli, PA-C  baclofen (LIORESAL) 10 MG tablet Take 5 mg by mouth 3 (three) times daily.   Yes Historical Provider, MD  Cholecalciferol (VITAMIN D) 2000 UNITS CAPS Take 2,000 Units by mouth daily.   Yes Historical Provider, MD  Coenzyme Q10 (CO Q-10) 200 MG CAPS Take 1 capsule by mouth daily. 05/11/12  Yes Daniel J Angiulli, PA-C  esomeprazole (NEXIUM) 20 MG capsule Take 20 mg by mouth daily before breakfast.   Yes Historical Provider, MD  gabapentin (NEURONTIN) 800 MG tablet Take 1,200 mg by mouth 3 (three) times daily.   Yes Historical Provider, MD  rivaroxaban (XARELTO) 20 MG TABS tablet Take 20 mg by mouth daily with supper.   Yes Historical Provider, MD   BP 114/65 mmHg  Pulse 84  Temp(Src) 98.4 F (36.9 C) (Oral)  Resp 18  Ht  (1.956 m)  Wt 290 lb (131.543 kg)  BMI 34.38 kg/m2  SpO2 97% Physical Exam  Constitutional: He is oriented to person, place, and time. He appears well-developed and well-nourished.  HENT:  Head: Normocephalic and atraumatic.  Right Ear: External ear normal.  Left Ear: External ear normal.  Nose: Nose normal.  Eyes: Right eye exhibits no discharge. Left eye exhibits no discharge.  Neck: Neck supple.  Cardiovascular: Normal rate, regular rhythm, normal heart sounds and intact distal pulses.   No murmur heard. Pulmonary/Chest: Effort normal and breath sounds normal. He has no wheezes. He has no rales.  Abdominal: Soft. There is no tenderness.  Musculoskeletal: He exhibits no edema.  Neurological: He is alert and oriented to person, place, and time.  Skin: Skin is warm and dry.  Nursing note and vitals  reviewed.   ED Course  Procedures (including critical care time) Labs Review Labs Reviewed  COMPREHENSIVE METABOLIC PANEL - Abnormal; Notable for the following:    Glucose, Bld 112 (*)    Creatinine, Ser 1.80 (*)    Total Protein 6.2 (*)    Total Bilirubin 2.2 (*)    GFR calc non Af Amer 40 (*)    GFR calc Af Amer 47 (*)    All other components within normal limits  URINALYSIS, ROUTINE W REFLEX MICROSCOPIC (NOT AT [facility]) - Abnormal; Notable for the following:    Color, Urine AMBER (*)    Urobilinogen, UA 4.0 (*)    All other components within normal limits  CULTURE, BLOOD (ROUTINE X 2)  CULTURE, BLOOD (ROUTINE X 2)  URINE CULTURE  CBC    Imaging Review Dg Chest 2 View  11/16/2014   CLINICAL DATA:  Patient with fever.  EXAM: CHEST  2 VIEW  COMPARISON:  Chest radiograph 07/22/2012  FINDINGS: Stable cardiac and mediastinal contours. Within the right lower lung there is suggestion of a possible 1.8 cm irregular nodule. Basilar atelectasis and or scarring. No pleural effusion or pneumothorax. Mid thoracic spine degenerative changes.  IMPRESSION: Suggestion of a 1.8 cm irregular nodular opacity within the right lower lung. Recommend dedicated evaluation with chest CT.  No acute cardiopulmonary process.   Electronically Signed   By: Annia Belt M.D.   On: 11/16/2014 13:01   I have personally reviewed and evaluated these images and lab results as part of my medical decision-making.   EKG Interpretation None      MDM   Final diagnoses:  Fever in adult  Pulmonary nodule  Elevated serum creatinine    Patient's fever is of unclear etiology. Patient has no focal signs or symptoms besides feeling warm. No murmur to suggest endocarditis. Patient otherwise feels well and appears well. He is on rituximab but has not had a dose in several months. The only significant amount and is lab work is a new creatinine of 1.8. Patient does not appear acutely dehydrated. Most recent creatinine was 0.9  several months ago at [facility]. After discussion with neurology at [facility], they will follow him up closely as an outpatient. He will follow-up closely with his PCP for recheck of creatinine as well as follow-up of his pulmonary nodule. Given no symptoms that do not feel anti-biotics are warranted at this time and discussed strict return precautions with patient and family.    Pricilla Loveless, MD 11/16/14 [phone]

## 2014-11-17 LAB — URINE CULTURE: CULTURE: NO GROWTH

## 2014-11-21 LAB — CULTURE, BLOOD (ROUTINE X 2)
CULTURE: NO GROWTH
Culture: NO GROWTH

## 2014-12-22 ENCOUNTER — Encounter: Payer: Self-pay | Admitting: Pulmonary Disease

## 2014-12-22 ENCOUNTER — Ambulatory Visit (INDEPENDENT_AMBULATORY_CARE_PROVIDER_SITE_OTHER): Payer: BC Managed Care – PPO | Admitting: Pulmonary Disease

## 2014-12-22 VITALS — BP 130/76 | HR 60 | Ht 77.0 in | Wt 316.0 lb

## 2014-12-22 DIAGNOSIS — R911 Solitary pulmonary nodule: Secondary | ICD-10-CM

## 2014-12-22 NOTE — Patient Instructions (Signed)
Please follow up with your doctor about scheduling your CT scan. We will see you after the scan is completed.

## 2014-12-22 NOTE — Progress Notes (Signed)
Subjective:    Patient ID: Matthew Juarez, male    DOB: 1960-09-06, 54 y.o.   MRN: [id number]  PROBLEM LIST: Abnormal chest x-ray with right lower lobe mass. PEA arrest secondary to presumed PE 2014 Right lower extremity DVT 2014  HPI  Consult for evaluation of abnormal chest x-ray.  Matthew Juarez is a 54 year old with past medical history as below. He went to the emergency room on 11/06/14 for fevers of one day. He got a chest x-ray which is read as right lower lobe mass. He did not get any antibiotics. He subsequently saw his primary care and a CT of the chest was ordered as a follow-up. This has not been done yet.  He denies any chest pain, shortness of breath, cough, sputum production, hemoptysis. He does not have any malaise or fatigue. He had smoked 2-3 cigarettes a day for about 6 months in 1985 and quit. He drinks alcohol about once a day. 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 There are no relevant occupational or home exposures.  His past medical history is significant for PEA arrest, DVT in 2014. He was found to have right lower extremity DVT and RV failure. This treated with thrombolytics and anticoagulated. CT scan was not done due to elevated creatinine. He continues to be on indefinite anticoagulation with Xarelto. He is wheelchair bound and immobile secondary to transverse myelitis.  CXR (11/16/14) Suggestion of a 1.8 cm irregular nodular opacity within the right lower lung. Recommend dedicated evaluation with chest CT. No acute cardiopulmonary process.    Patient Active Problem List   Diagnosis Date Noted  . Pulmonary embolism 07/23/2012  . C. difficile colitis 07/05/2012  . C. difficile 07/03/2012  . Pleural effusion 07/01/2012  . Hematuria 06/28/2012  . Acute respiratory failure 06/23/2012  . Cardiac arrest 06/23/2012  . Shock 06/23/2012  . Transverse myelitis 04/21/2012  . Closed right ankle fracture 04/19/2012    . Lower paraplegia 04/17/2012  . Acute transverse myelitis 04/17/2012  . NSVT (nonsustained ventricular tachycardia) 04/17/2012  . Chronic back pain     Current outpatient prescriptions:  .  acetaminophen (TYLENOL) 500 MG tablet, Take 500 mg by mouth every 6 (six) hours as needed for mild pain., Disp: , Rfl:  .  allopurinol (ZYLOPRIM) 300 MG tablet, Take 1 tablet (300 mg total) by mouth daily., Disp: 30 tablet, Rfl: 1 .  baclofen (LIORESAL) 10 MG tablet, Take 5 mg by mouth 3 (three) times daily., Disp: , Rfl:  .  Cholecalciferol (VITAMIN D) 2000 UNITS CAPS, Take 2,000 Units by mouth daily., Disp: , Rfl:  .  Coenzyme Q10 (CO Q-10) 200 MG CAPS, Take 1 capsule by mouth daily., Disp: 30 each, Rfl: 1 .  esomeprazole (NEXIUM) 20 MG capsule, Take 20 mg by mouth daily before breakfast., Disp: , Rfl:  .  gabapentin (NEURONTIN) 800 MG tablet, Take 1,200 mg by mouth 3 (three) times daily., Disp: , Rfl:  .  Oxycodone HCl 10 MG TABS, Take 1 tablet by mouth daily as needed., Disp: , Rfl:  .  rivaroxaban (XARELTO) 20 MG TABS tablet, Take 20 mg by mouth daily with supper., Disp: , Rfl:     Review of Systems  Constitutional: Positive for fever and unexpected weight change.  HENT: Negative for congestion, dental problem, ear pain, nosebleeds, postnasal drip, rhinorrhea, sinus pressure, sneezing, sore throat and trouble swallowing.   Eyes: Negative for redness and itching.  Respiratory:  Negative for cough, chest tightness, shortness of breath and wheezing.   Cardiovascular: Negative for palpitations and leg swelling.  Gastrointestinal: Negative for nausea and vomiting.  Genitourinary: Negative for dysuria.  Musculoskeletal: Positive for joint swelling.  Skin: Negative for rash.  Neurological: Negative for headaches.  Hematological: Bruises/bleeds easily.  Psychiatric/Behavioral: Negative for dysphoric mood. The patient is not nervous/anxious.    Blood pressure 130/76, pulse 60, height  (1.956 m),  weight 316 lb (143.337 kg), SpO2 96 %.     Objective:   Physical Exam  Constitutional: He is oriented to person, place, and time. He appears well-developed and well-nourished.  HENT:  Mouth/Throat: Oropharynx is clear and moist.  Eyes: Pupils are equal, round, and reactive to light.  Neck: Normal range of motion. Neck supple.  Cardiovascular: Normal rate and normal heart sounds.   Pulmonary/Chest: Effort normal and breath sounds normal.  Abdominal: Soft. Bowel sounds are normal.  Musculoskeletal: Normal range of motion.  Neurological: He is alert and oriented to person, place, and time.  Skin: Skin is warm and dry.      Assessment & Plan:   #1 Abnormal chest x-ray. He will need a CT scan follow up. This has already been ordered by his primary care. He is awaiting insurance clearance to get this done. Follow up in our office after the scan.  #2 DVT/PE 2014. He continues to be an Xarelto. He likely need indefinite anticoagulation because of his immobility.  Matthew Greathouse MD [facility] Pager [phone] If no answer or after 3pm call: [phone] 12/22/2014, 4:20 PM

## 2014-12-28 ENCOUNTER — Telehealth: Payer: Self-pay | Admitting: Pulmonary Disease

## 2014-12-28 DIAGNOSIS — R911 Solitary pulmonary nodule: Secondary | ICD-10-CM

## 2014-12-28 NOTE — Telephone Encounter (Signed)
Called spoke with pt. He reports he prefers if we order his CT scan instead of his PCP. He reports he doesn't know what they are doing or why it's taking so long. Please advise Dr. Isaiah Serge thanks

## 2014-12-29 NOTE — Telephone Encounter (Signed)
Ok. Please arrange for CT chest without contrast.

## 2014-12-29 NOTE — Telephone Encounter (Signed)
I have placed order. Pt will be contacted by PCC's. Nothing further needed

## 2015-01-03 ENCOUNTER — Inpatient Hospital Stay: Admission: RE | Admit: 2015-01-03 | Payer: BC Managed Care – PPO | Source: Ambulatory Visit

## 2015-01-08 ENCOUNTER — Ambulatory Visit (INDEPENDENT_AMBULATORY_CARE_PROVIDER_SITE_OTHER)
Admission: RE | Admit: 2015-01-08 | Discharge: 2015-01-08 | Disposition: A | Discharge status: Home / Self Care | Payer: BC Managed Care – PPO | Source: Ambulatory Visit | Attending: Pulmonary Disease | Admitting: Pulmonary Disease

## 2015-01-08 DIAGNOSIS — R911 Solitary pulmonary nodule: Secondary | ICD-10-CM | POA: Diagnosis not present

## 2015-01-15 IMAGING — CR DG CHEST 2V
4 series · 4 of 4 positions shown · non-contrast
Comparison: None.

CLINICAL DATA: Numbness in legs.  Fall.

CHEST - 2 VIEW

[x chest ap (1 of 2)]
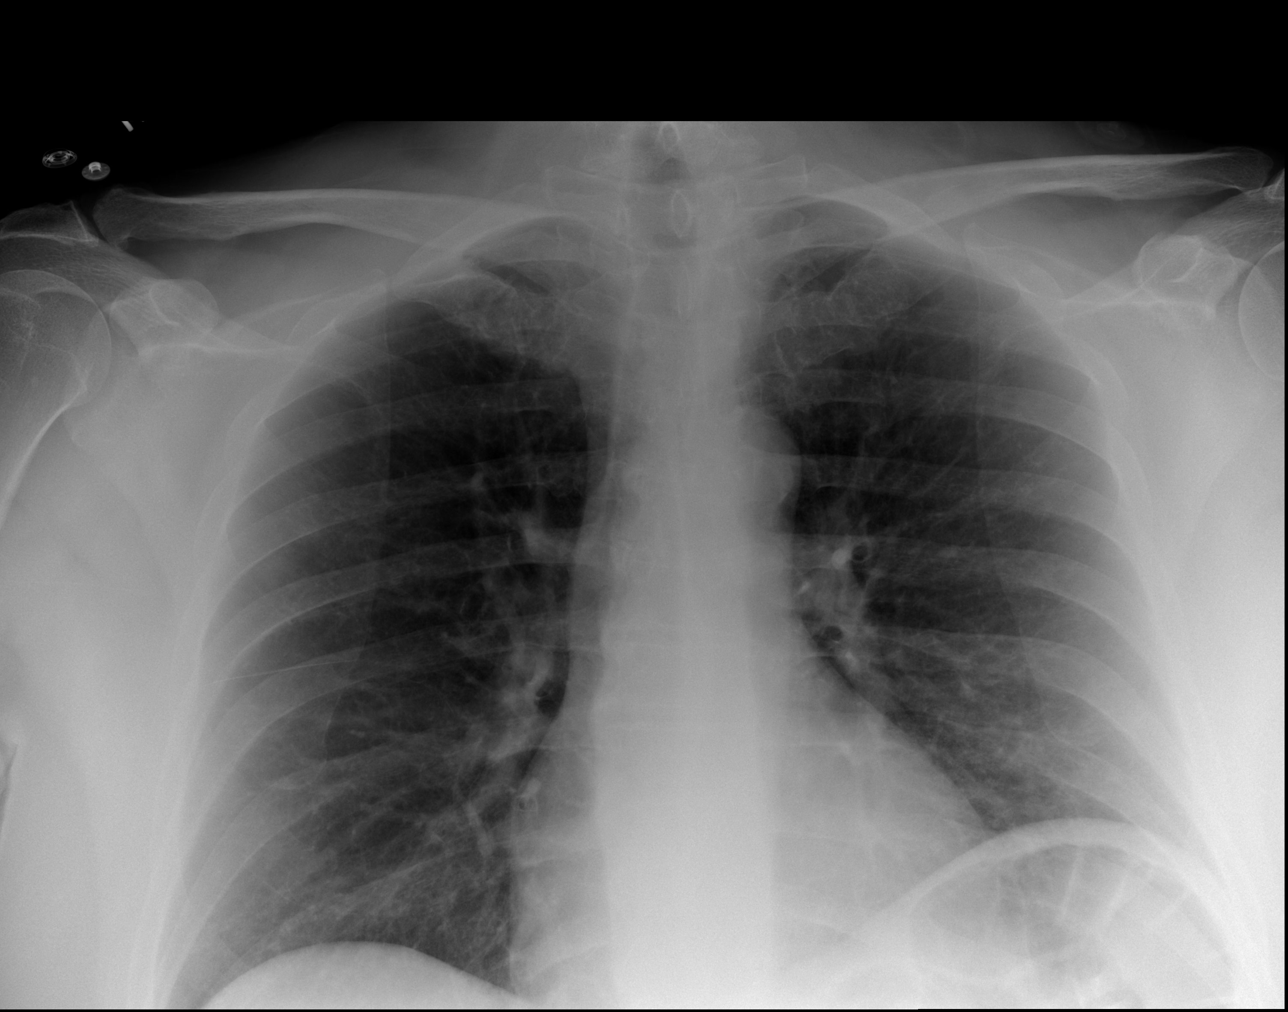

[x chest ap (2 of 2)]
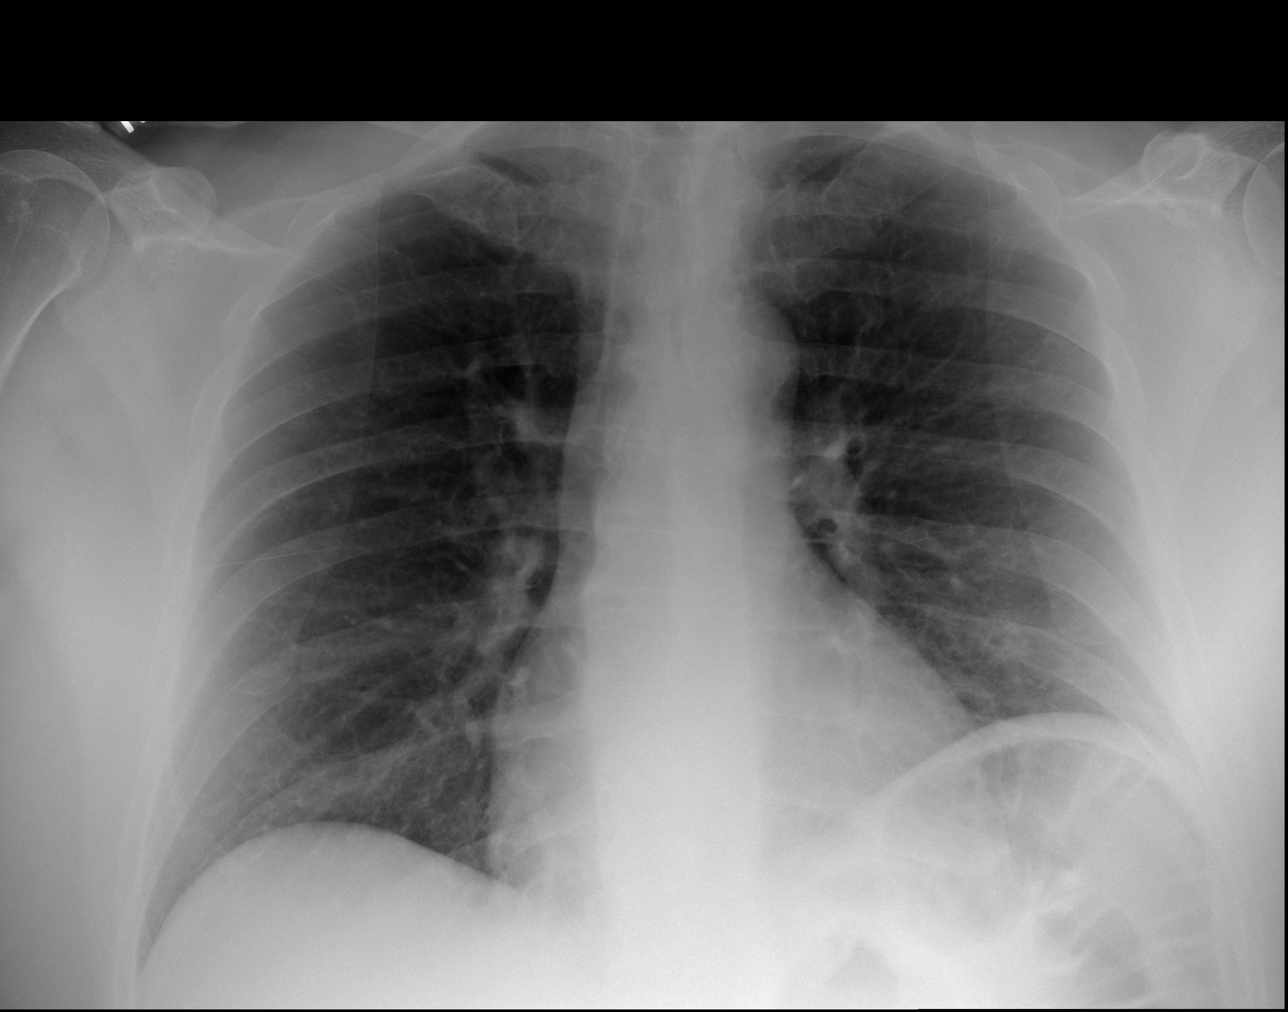

[w chest lat (1 of 2)]
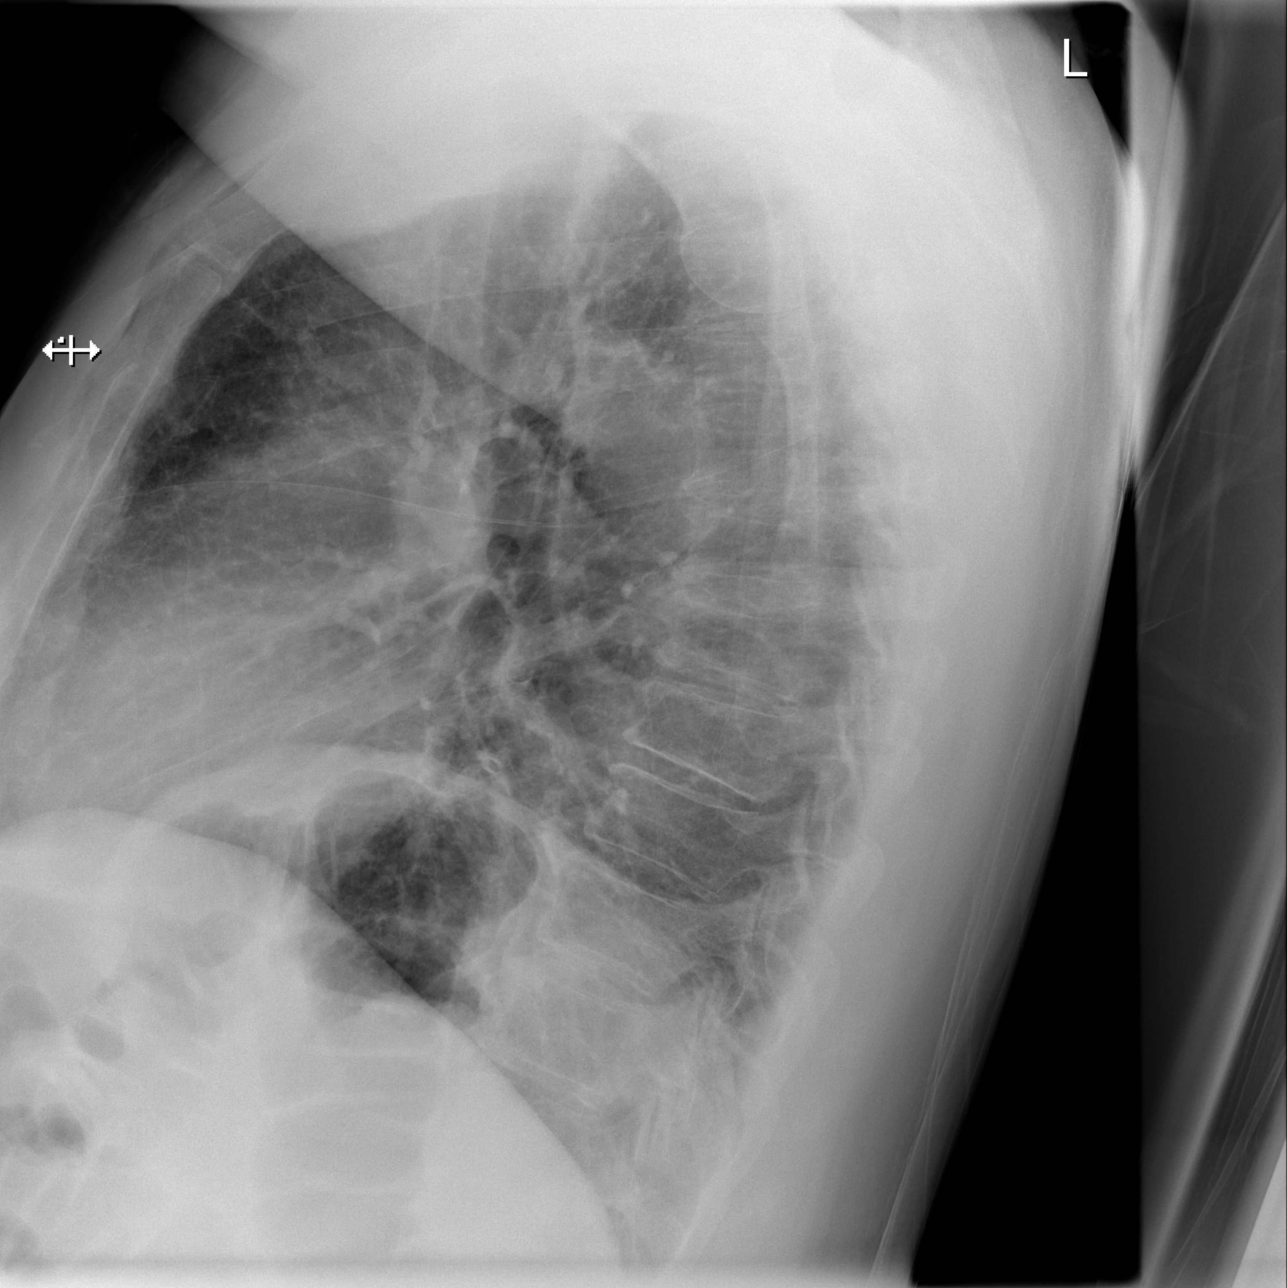

[w chest lat (2 of 2)]
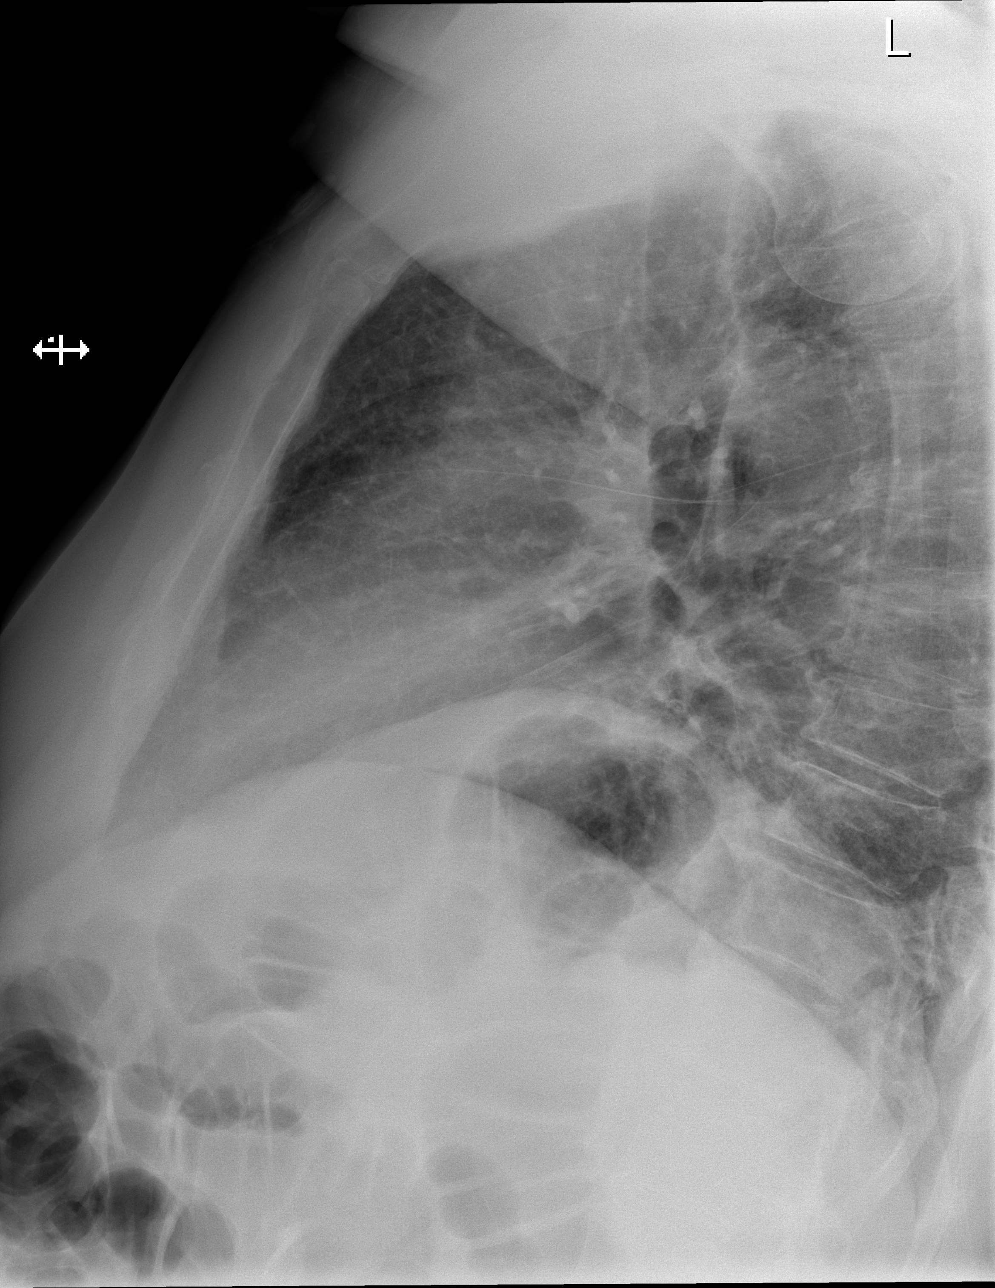

[4 of 4 positions shown; findings below may reference images not displayed]

FINDINGS: Lateral views degraded by patient arm position.  Frontal
view is mildly degraded by patient body habitus.  Decreased mid
thoracic vertebral body height at numerous levels is favored to be
within normal variation.

Midline trachea.  Normal heart size.  Mild left hemidiaphragm
elevation. No pleural effusion or pneumothorax.  Clear lungs.

No free intraperitoneal air.
IMPRESSION: Mildly low lung volumes, without acute disease.

## 2015-01-15 IMAGING — CR DG ANKLE COMPLETE 3+V*R*
3 series · 3 of 3 positions shown · non-contrast
Comparison: None.

CLINICAL DATA: Trauma with pain and numbness.

RIGHT ANKLE - COMPLETE 3+ VIEW

[x ankle lat right]
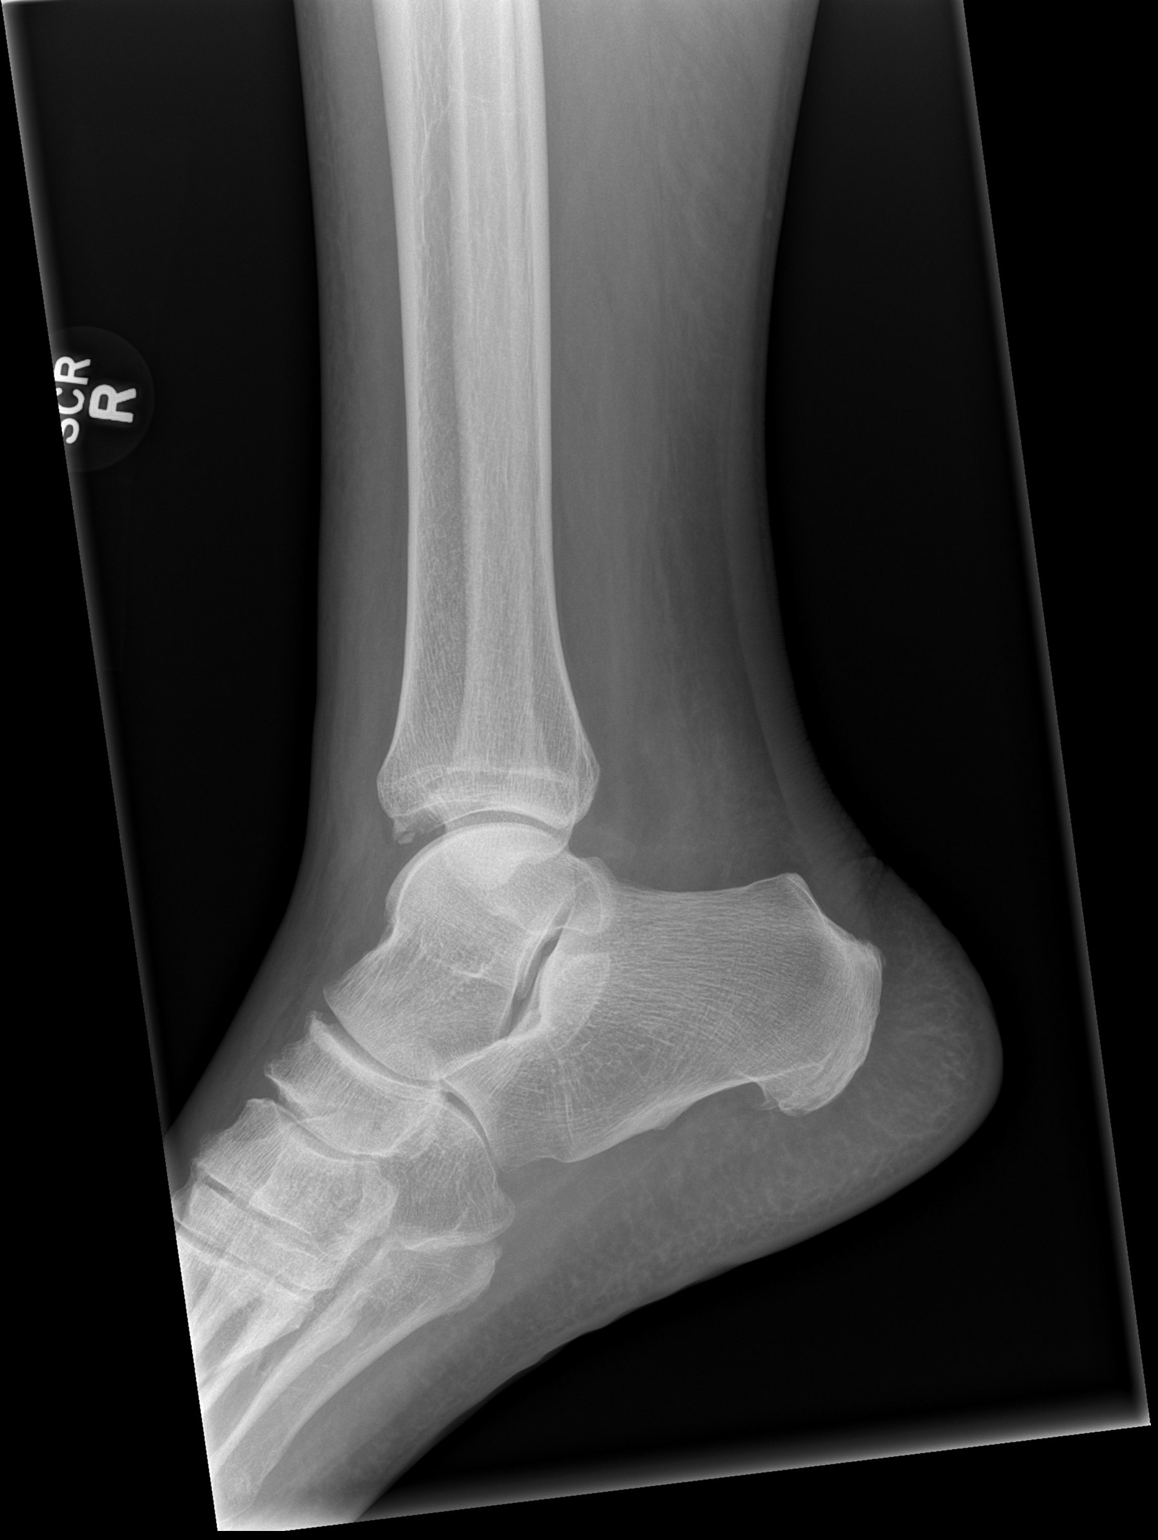

[x ankle ap right]
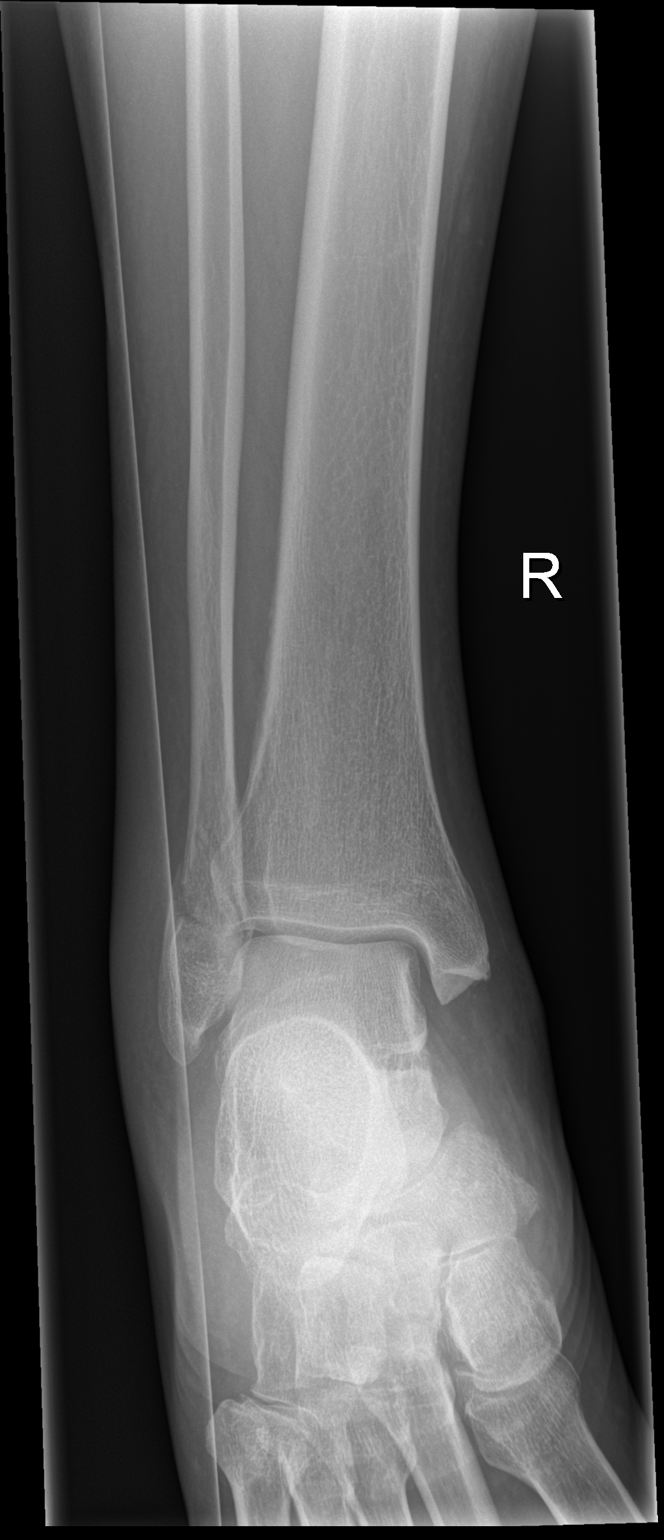

[x ankle obl right]
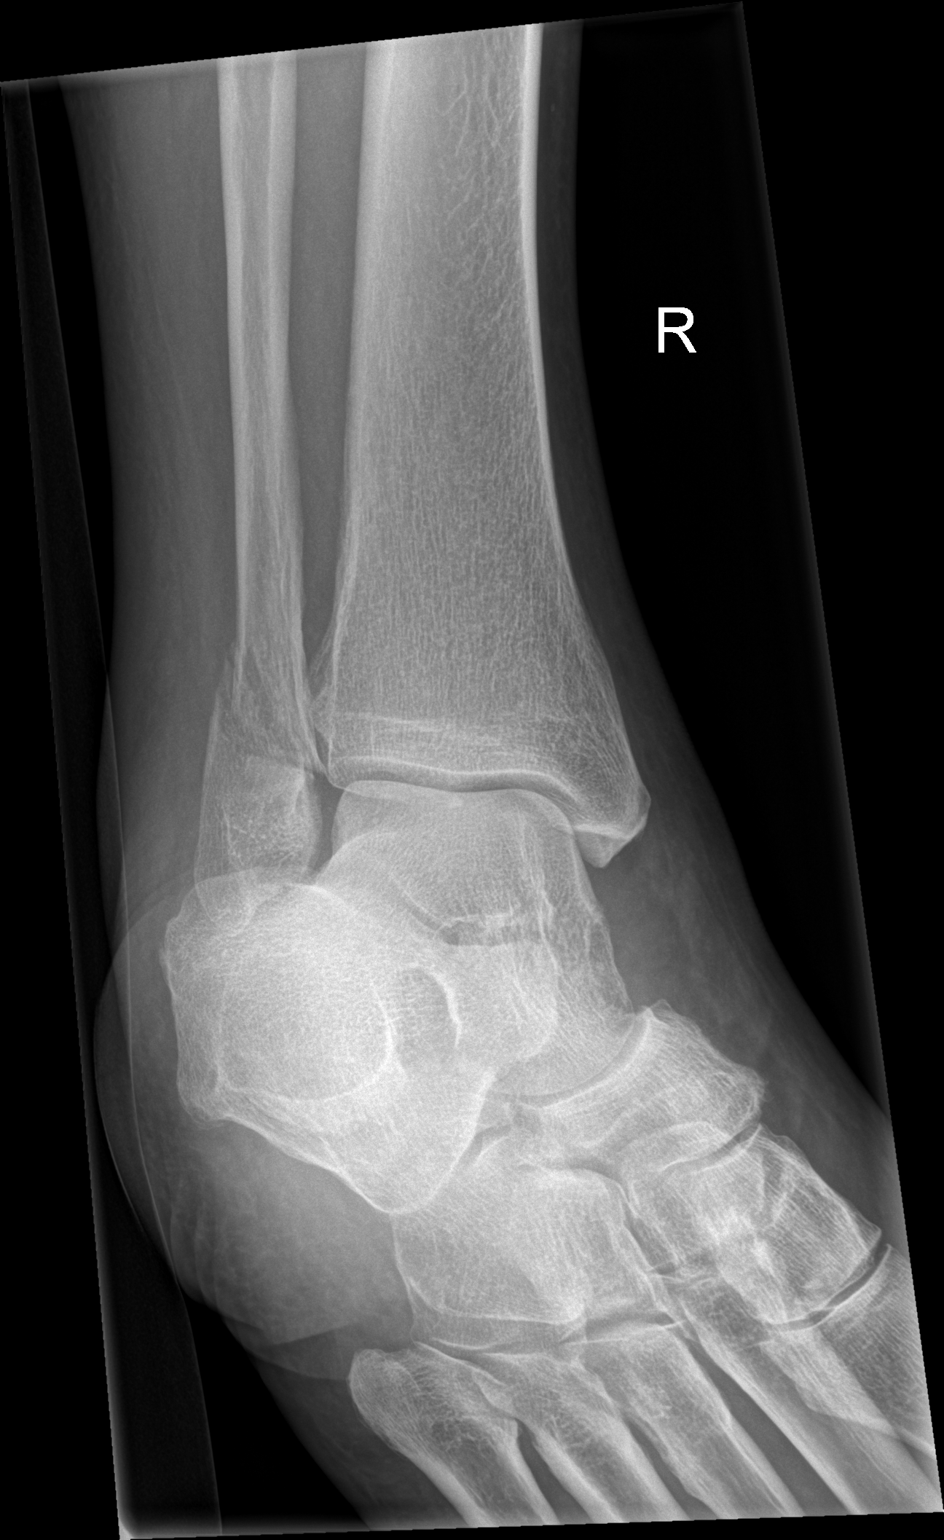

[3 of 3 positions shown; findings below may reference images not displayed]

FINDINGS: Minimally displaced oblique fracture of the distal fibula
with overlying soft tissue swelling. Base of fifth metatarsal and
talar dome intact.  There is osseous irregularity about the
anterior tibia on the lateral view.
IMPRESSION: Distal fibular fracture.

Anterior tibial osseous irregularity, favored to be degenerative.

## 2015-07-11 ENCOUNTER — Ambulatory Visit (INDEPENDENT_AMBULATORY_CARE_PROVIDER_SITE_OTHER): Payer: Medicare Other | Admitting: Family Medicine

## 2015-07-11 ENCOUNTER — Encounter: Payer: Self-pay | Admitting: Family Medicine

## 2015-07-11 VITALS — BP 110/72 | HR 65 | Temp 98.2°F | Ht 77.0 in | Wt 316.1 lb

## 2015-07-11 DIAGNOSIS — G894 Chronic pain syndrome: Secondary | ICD-10-CM

## 2015-07-11 DIAGNOSIS — Z86711 Personal history of pulmonary embolism: Secondary | ICD-10-CM | POA: Diagnosis not present

## 2015-07-11 DIAGNOSIS — G373 Acute transverse myelitis in demyelinating disease of central nervous system: Secondary | ICD-10-CM

## 2015-07-11 NOTE — Patient Instructions (Signed)
Nice to meet you. We will have you come back for a full physical exam at your convenience in the next month. We will request records from your prior physician. Please continue to do the exercises 3 or weakness. If you develop worsening weakness, or you develop numbness, tingling, fevers, chest pain, shortness of breath, or any new or changing symptoms please seek medical attention.

## 2015-07-11 NOTE — Progress Notes (Signed)
Patient ID: Matthew Juarez, male   DOB: [birth year], 55 y.o.   MRN: [id number]  [name], MD Phone: [phone]  Matthew Juarez is a 55 y.o. male who presents today for new patient visit  Patient presents to establish care. He has a history of transverse myelitis first episode in 2014. He was hospitalized for this. Underwent extensive treatment and is followed up with [facility] since that time. Sees neurology for this. He has not had a flareup of this in about 3 years. He does note his right leg is slightly weaker than his left leg. He uses a walker for this. He does his physical therapy exercises. He takes Rituxan every 4 months. Next is scheduled for May 2. Does see pain management for chronic shoulder pain related to using his wheelchair. No pain in his shoulders at this time. Takes oxycodone for this. He has a history of a PE and cardiac arrest in 2014. Was admitted to the ICU for this. He's been on anticoagulation since that time. He is on Xarelto at this time. No reported bleeding. No chest pain or shortness of breath.  Active Ambulatory Problems    Diagnosis Date Noted  . Chronic back pain   . Closed right ankle fracture 04/19/2012  . Transverse myelitis (HCC) 04/21/2012  . Cardiac arrest (HCC) 06/23/2012  . Hematuria 06/28/2012  . History of pulmonary embolus (PE) 07/23/2012  . Chronic pain syndrome 07/11/2015   Resolved Ambulatory Problems    Diagnosis Date Noted  . Lower paraplegia (HCC) 04/17/2012  . Acute transverse myelitis (HCC) 04/17/2012  . NSVT (nonsustained ventricular tachycardia) (HCC) 04/17/2012  . Acute respiratory failure (HCC) 06/23/2012  . Shock (HCC) 06/23/2012  . Pleural effusion 07/01/2012  . C. difficile 07/03/2012  . C. difficile colitis 07/05/2012   Past Medical History  Diagnosis Date  . Hypertension   . Gout   . Headache(784.0)   . Arthritis   . History of blood clots   . Hammer toe     Family History  Problem Relation Age of Onset  . Lung  cancer Maternal Uncle   . Colon cancer Maternal Aunt   . Lung cancer Maternal Aunt     Social History   Social History  . Marital Status: Married    Spouse Name: N/A  . Number of Children: N/A  . Years of Education: N/A   Occupational History  . disability    Social History Main Topics  . Smoking status: Former Smoker -- 1 years    Types: Cigarettes    Quit date: 04/01/1983  . Smokeless tobacco: Never Used     Comment: only snmoke 2- cigsper day when he smoked  . Alcohol Use: No  . Drug Use: No  . Sexual Activity: Not on file   Other Topics Concern  . Not on file   Social History Narrative    ROS  General:  Negative for nexplained weight loss, fever Skin: Negative for new or changing mole, sore that won't heal HEENT: Negative for trouble hearing, trouble seeing, ringing in ears, mouth sores, hoarseness, change in voice, dysphagia. CV:  Negative for chest pain, dyspnea, edema, palpitations Resp: Negative for cough, dyspnea, hemoptysis GI: Negative for nausea, vomiting, diarrhea, constipation, abdominal pain, melena, hematochezia. GU: Negative for dysuria, incontinence, urinary hesitance, hematuria, vaginal or penile discharge, polyuria, sexual difficulty, lumps in testicle or breasts MSK: Negative for muscle cramps or aches, positive joint pain or swelling Neuro: Positive for weakness, Negative for headaches, numbness, dizziness, passing out/fainting  Psych: Negative for depression, anxiety, memory problems  Objective  Physical Exam Filed Vitals:   07/11/15 1434  BP: 110/72  Pulse: 65  Temp: 98.2 F (36.8 C)    BP Readings from Last 3 Encounters:  07/11/15 110/72  12/22/14 130/76  11/16/14 131/75   Wt Readings from Last 3 Encounters:  07/11/15 316 lb 1.9 oz (143.391 kg)  12/22/14 316 lb (143.337 kg)  11/16/14 290 lb (131.543 kg)    Physical Exam  Constitutional: He is well-developed, well-nourished, and in no distress.  HENT:  Head: Normocephalic  and atraumatic.  Right Ear: External ear normal.  Left Ear: External ear normal.  Cardiovascular: Normal rate, regular rhythm and normal heart sounds.   Pulmonary/Chest: Effort normal and breath sounds normal.  Abdominal: Soft. He exhibits no distension. There is no tenderness.  Neurological: He is alert.  5/5 strength in bilateral biceps, triceps, grip, quads, hamstrings, plantar and dorsiflexion, sensation to light touch intact in bilateral UE and LE, 2+ patellar reflexes  Skin: Skin is warm and dry. He is not diaphoretic.  Psychiatric: Mood and affect normal.     Assessment/Plan:   Transverse myelitis Patient with history of transverse myelitis. [facility] and receiving Rituxan. Neurological exam appears normal on strength testing. He will continue to monitor and follow with [facility] neurology.  History of pulmonary embolus (PE) Currently on Xarelto for his history of PE. No chest pain or shortness of breath. No recurrence of PE since starting on anticoagulation. We will request records from his prior PCP for further history on this. Given return precautions.  Chronic pain syndrome [facility] He will continue to follow with them.    Marikay AlarEric Aoki Wedemeyer, MD [facility] [place]

## 2015-07-11 NOTE — Assessment & Plan Note (Signed)
[facility]. He will continue to follow with them.

## 2015-07-11 NOTE — Progress Notes (Signed)
Pre visit review using our clinic review tool, if applicable. No additional management support is needed unless otherwise documented below in the visit note. 

## 2015-07-11 NOTE — Assessment & Plan Note (Signed)
Patient with history of transverse myelitis. [facility] and receiving Rituxan. Neurological exam appears normal on strength testing. He will continue to monitor and follow with [facility] neurology.

## 2015-07-11 NOTE — Assessment & Plan Note (Signed)
Currently on Xarelto for his history of PE. No chest pain or shortness of breath. No recurrence of PE since starting on anticoagulation. We will request records from his prior PCP for further history on this. Given return precautions.

## 2015-07-27 ENCOUNTER — Telehealth: Payer: Self-pay | Admitting: *Deleted

## 2015-07-27 NOTE — Telephone Encounter (Signed)
Patient quested if Dr. Birdie SonsSonnenberg received medical records from [facility].  Pt contact [phone]

## 2015-07-31 DIAGNOSIS — Z79899 Other long term (current) drug therapy: Secondary | ICD-10-CM | POA: Diagnosis not present

## 2015-07-31 DIAGNOSIS — G379 Demyelinating disease of central nervous system, unspecified: Secondary | ICD-10-CM | POA: Diagnosis not present

## 2015-07-31 DIAGNOSIS — G9589 Other specified diseases of spinal cord: Secondary | ICD-10-CM | POA: Diagnosis not present

## 2015-07-31 DIAGNOSIS — G360 Neuromyelitis optica [Devic]: Secondary | ICD-10-CM | POA: Diagnosis not present

## 2015-08-01 ENCOUNTER — Other Ambulatory Visit: Payer: Self-pay | Admitting: Family Medicine

## 2015-08-01 NOTE — Telephone Encounter (Signed)
Please advise refill as you have not filled for him before. thanks

## 2015-08-01 NOTE — Telephone Encounter (Signed)
Patient needs renal function checked prior to refill being given as his renal function was decreased at last check.

## 2015-08-01 NOTE — Telephone Encounter (Signed)
Spoke with the patient he is coming on Friday to get labs.  Please order.  thanks

## 2015-08-01 NOTE — Telephone Encounter (Signed)
Pt needs a refill on allopurinol (ZYLOPRIM) 300 MG tablet... [facility] [place] [phone]

## 2015-08-02 ENCOUNTER — Telehealth: Payer: Self-pay | Admitting: *Deleted

## 2015-08-02 DIAGNOSIS — M109 Gout, unspecified: Secondary | ICD-10-CM

## 2015-08-02 NOTE — Telephone Encounter (Signed)
Order placed

## 2015-08-02 NOTE — Telephone Encounter (Signed)
Pt coming in tomorrow, labs and dx? 

## 2015-08-02 NOTE — Telephone Encounter (Signed)
Notified patient that we did receive records

## 2015-08-03 ENCOUNTER — Other Ambulatory Visit (INDEPENDENT_AMBULATORY_CARE_PROVIDER_SITE_OTHER): Payer: Medicare Other

## 2015-08-03 DIAGNOSIS — M109 Gout, unspecified: Secondary | ICD-10-CM

## 2015-08-03 LAB — COMPREHENSIVE METABOLIC PANEL
ALT: 21 U/L (ref 0–53)
AST: 18 U/L (ref 0–37)
Albumin: 4.4 g/dL (ref 3.5–5.2)
Alkaline Phosphatase: 76 U/L (ref 39–117)
BILIRUBIN TOTAL: 1 mg/dL (ref 0.2–1.2)
BUN: 13 mg/dL (ref 6–23)
CALCIUM: 9.4 mg/dL (ref 8.4–10.5)
CO2: 29 meq/L (ref 19–32)
CREATININE: 0.9 mg/dL (ref 0.40–1.50)
Chloride: 104 mEq/L (ref 96–112)
GFR: 111.95 mL/min (ref >60.00)
GLUCOSE: 105 mg/dL — AB (ref 70–99)
Potassium: 4.1 mEq/L (ref 3.5–5.1)
SODIUM: 140 meq/L (ref 135–145)
Total Protein: 6.2 g/dL (ref 6.0–8.3)

## 2015-08-03 MED ORDER — ALLOPURINOL 300 MG PO TABS: 300.0000 mg | ORAL_TABLET | Freq: Every day | ORAL | Status: DC

## 2015-08-03 NOTE — Telephone Encounter (Signed)
Please see below, thanks.

## 2015-08-07 ENCOUNTER — Encounter: Payer: Self-pay | Admitting: Family Medicine

## 2015-08-10 ENCOUNTER — Ambulatory Visit (INDEPENDENT_AMBULATORY_CARE_PROVIDER_SITE_OTHER): Payer: Medicare Other | Admitting: Family Medicine

## 2015-08-10 ENCOUNTER — Encounter: Payer: Self-pay | Admitting: Family Medicine

## 2015-08-10 VITALS — BP 132/80 | HR 56 | Temp 98.3°F | Ht 77.0 in | Wt 321.0 lb

## 2015-08-10 DIAGNOSIS — B372 Candidiasis of skin and nail: Secondary | ICD-10-CM | POA: Diagnosis not present

## 2015-08-10 DIAGNOSIS — Z8679 Personal history of other diseases of the circulatory system: Secondary | ICD-10-CM | POA: Diagnosis not present

## 2015-08-10 DIAGNOSIS — M109 Gout, unspecified: Secondary | ICD-10-CM | POA: Insufficient documentation

## 2015-08-10 DIAGNOSIS — M1A0710 Idiopathic chronic gout, right ankle and foot, without tophus (tophi): Secondary | ICD-10-CM | POA: Diagnosis not present

## 2015-08-10 MED ORDER — NYSTATIN 100000 UNIT/GM EX CREA: 1.0000 "application " | TOPICAL_CREAM | Freq: Two times a day (BID) | CUTANEOUS | Status: DC

## 2015-08-10 NOTE — Assessment & Plan Note (Signed)
No recent issues with this. Renal function recently checked. We will continue allopurinol.

## 2015-08-10 NOTE — Progress Notes (Signed)
Patient ID: Matthew Juarez, male   DOB: 05/19/1960, 55 y.o.   MRN: [id number]  Marikay AlarEric Sonnenberg, MD Phone: [phone]  Matthew Juarez is a 55 y.o. male who presents today for follow-up.  History of gout: Patient notes he has not had an episode of gout in the number of years. In the past he's had an aching sensation in his right first MTP joint where he could not let anything touch it. He's been on allopurinol daily over the last several years. Renal function has been reviewed recently. He notes with acute flares he would take Tylenol though this would not be beneficial.  Elevated blood pressure: Patient notes he has had a history of elevated blood pressure in the past. He was on a medication previously though not recently as his blood pressures been well controlled. No chest pain, shortness breath, or edema.  Candidal intertrigo: Patient notes left groin rash that itches. Has been there for a number of months. No pain or warmth or drainage. No fevers. Feels well overall.  PMH: Former smoker   ROS see history of present illness  Objective  Physical Exam Filed Vitals:   08/10/15 0959  BP: 132/80  Pulse: 56  Temp: 98.3 F (36.8 C)    BP Readings from Last 3 Encounters:  08/10/15 132/80  07/11/15 110/72  12/22/14 130/76   Wt Readings from Last 3 Encounters:  08/10/15 321 lb (145.605 kg)  07/11/15 316 lb 1.9 oz (143.391 kg)  12/22/14 316 lb (143.337 kg)    Physical Exam  Constitutional: No distress.  HENT:  Head: Normocephalic and atraumatic.  Cardiovascular: Normal rate, regular rhythm and normal heart sounds.   Pulmonary/Chest: Effort normal and breath sounds normal.  Neurological: He is alert.  Skin: Skin is warm and dry. He is not diaphoretic.  Right groin with patch of erythema with satellite lesions noted, nontender, no warmth, no induration, no fluctuance, left groin with no lesions, scrotum with no lesions     Assessment/Plan: Please see individual problem  list.  Gout No recent issues with this. Renal function recently checked. We will continue allopurinol.  History of hypertension Patient with history of hypertension and previously treated with medication. Blood pressures been well controlled in our office. No medications at this time. We will continue to monitor.  Candidal intertrigo Rash consistent with candidal intertrigo. Not consistent with cellulitis. Discussed keeping the area air out. We'll treat with nystatin cream. Given return precautions.   No orders of the defined types were placed in this encounter.    Meds ordered this encounter  Medications  . nystatin cream (MYCOSTATIN)    Sig: Apply 1 application topically 2 (two) times daily.    Dispense:  30 g    Refill:  0    Marikay AlarEric Sonnenberg, MD [facility] [place]

## 2015-08-10 NOTE — Progress Notes (Signed)
Pre visit review using our clinic review tool, if applicable. No additional management support is needed unless otherwise documented below in the visit note. 

## 2015-08-10 NOTE — Assessment & Plan Note (Signed)
Patient with history of hypertension and previously treated with medication. Blood pressures been well controlled in our office. No medications at this time. We will continue to monitor.

## 2015-08-10 NOTE — Assessment & Plan Note (Signed)
Rash consistent with candidal intertrigo. Not consistent with cellulitis. Discussed keeping the area air out. We'll treat with nystatin cream. Given return precautions.

## 2015-08-10 NOTE — Patient Instructions (Addendum)
Nice to see you. We will continue allopurinol. We'll continue to monitor your blood pressure. You can use the nystatin cream for the rash in your groin. If this does not improve in the next week please let us know. If you develop fevers, spreading rash, pain in the area of rash, warmth, drainage, begin to feel sick, or any new or changing symptoms please seek medical attention. I'll see you back in 1 month.

## 2015-09-13 ENCOUNTER — Encounter: Payer: Self-pay | Admitting: Family Medicine

## 2015-09-13 ENCOUNTER — Ambulatory Visit (INDEPENDENT_AMBULATORY_CARE_PROVIDER_SITE_OTHER): Payer: Medicare Other | Admitting: Family Medicine

## 2015-09-13 VITALS — BP 118/66 | HR 64 | Temp 98.6°F | Ht 77.0 in

## 2015-09-13 DIAGNOSIS — G373 Acute transverse myelitis in demyelinating disease of central nervous system: Secondary | ICD-10-CM | POA: Diagnosis not present

## 2015-09-13 DIAGNOSIS — B372 Candidiasis of skin and nail: Secondary | ICD-10-CM | POA: Diagnosis not present

## 2015-09-13 DIAGNOSIS — Z8679 Personal history of other diseases of the circulatory system: Secondary | ICD-10-CM | POA: Diagnosis not present

## 2015-09-13 DIAGNOSIS — R7309 Other abnormal glucose: Secondary | ICD-10-CM | POA: Diagnosis not present

## 2015-09-13 DIAGNOSIS — R7303 Prediabetes: Secondary | ICD-10-CM | POA: Insufficient documentation

## 2015-09-13 NOTE — Assessment & Plan Note (Addendum)
Elevated on last CMP and prior BMP. Patient is going to return for a complete physical exam and will have lab work at that time to follow-up on this.

## 2015-09-13 NOTE — Assessment & Plan Note (Signed)
Resolved. Patient will continue to monitor. 

## 2015-09-13 NOTE — Assessment & Plan Note (Signed)
Referral to [facility] placed to help him learn how to use his new car. We'll also give prescription for fold up mechanical wheelchair as well.

## 2015-09-13 NOTE — Patient Instructions (Signed)
Nice to see you. Please monitor the area of rash she had previously. If this recurs please let us know. We will call with results of your diabetes test.

## 2015-09-13 NOTE — Progress Notes (Signed)
Patient ID: Matthew Juarez, male   DOB: 09/20/1960, 55 y.o.   MRN: [id number]  Matthew AlarEric Roshelle Traub, MD Phone: [phone]  Matthew Juarez is a 55 y.o. male who presents today for follow-up.  Candidal intertrigo: Patient reports this is completely cleared up. No itching or burning. Has continued to use the cream.  History of elevated blood pressure: Has been normal the last several checks. No chest pain or shortness of breath. No medicines for this.  Elevated glucose: Mildly elevated on last CMP. No polyuria occasional urgency he can get to the bathroom quickly enough. No history of diabetes.  Patient also reports he needs a prescription for a fold down motorized wheelchair as [place]. Also needs a referral to [facility] to help him learn how to drive a car again.  PMH: Former smoker   ROS see history of present illness  Objective  Physical Exam Filed Vitals:   09/13/15 1607  BP: 118/66  Pulse: 64  Temp: 98.6 F (37 C)    BP Readings from Last 3 Encounters:  09/13/15 118/66  08/10/15 132/80  07/11/15 110/72   Wt Readings from Last 3 Encounters:  08/10/15 321 lb (145.605 kg)  07/11/15 316 lb 1.9 oz (143.391 kg)  12/22/14 316 lb (143.337 kg)    Physical Exam  Constitutional: He is well-developed, well-nourished, and in no distress.  HENT:  Head: Normocephalic and atraumatic.  Right Ear: External ear normal.  Left Ear: External ear normal.  Cardiovascular: Normal rate, regular rhythm and normal heart sounds.   Pulmonary/Chest: Effort normal and breath sounds normal.  Neurological: He is alert.  Skin: Skin is warm and dry. He is not diaphoretic.     Assessment/Plan: Please see individual problem list.  Candidal intertrigo Resolved. Patient will continue to monitor.  History of hypertension Blood pressure has been well-controlled. We will continue to monitor at future office visits.  Transverse  myelitis Referral to rehabilitation at [facility] placed to help him learn how to use his new car. We'll also give prescription for fold up mechanical wheelchair as well.  Elevated glucose Elevated on last CMP and prior BMP. Patient is going to return for a complete physical exam and will have lab work at that time to follow-up on this.    Orders Placed This Encounter  Procedures  . Ambulatory referral to Occupational Therapy    Referral Priority:  Routine    Referral Type:  Occupational Therapy    Referral Reason:  Specialty Services Required    Requested Specialty:  Occupational Therapy    Number of Visits Requested:  1    Matthew AlarEric Breleigh Carpino, MD [facility] [place]

## 2015-09-13 NOTE — Assessment & Plan Note (Signed)
Blood pressure has been well-controlled. We will continue to monitor at future office visits.

## 2015-09-13 NOTE — Progress Notes (Signed)
Pre visit review using our clinic review tool, if applicable. No additional management support is needed unless otherwise documented below in the visit note. 

## 2015-09-17 DIAGNOSIS — H35033 Hypertensive retinopathy, bilateral: Secondary | ICD-10-CM | POA: Diagnosis not present

## 2015-09-17 DIAGNOSIS — H25013 Cortical age-related cataract, bilateral: Secondary | ICD-10-CM | POA: Diagnosis not present

## 2015-09-17 DIAGNOSIS — H04123 Dry eye syndrome of bilateral lacrimal glands: Secondary | ICD-10-CM | POA: Diagnosis not present

## 2015-09-17 DIAGNOSIS — H2513 Age-related nuclear cataract, bilateral: Secondary | ICD-10-CM | POA: Diagnosis not present

## 2015-10-12 ENCOUNTER — Encounter: Payer: Self-pay | Admitting: Family Medicine

## 2015-10-12 ENCOUNTER — Ambulatory Visit (INDEPENDENT_AMBULATORY_CARE_PROVIDER_SITE_OTHER): Payer: Medicare Other | Admitting: Family Medicine

## 2015-10-12 VITALS — BP 130/80 | HR 63 | Temp 97.9°F | Ht 75.5 in | Wt 318.0 lb

## 2015-10-12 DIAGNOSIS — R7309 Other abnormal glucose: Secondary | ICD-10-CM | POA: Diagnosis not present

## 2015-10-12 DIAGNOSIS — Z13220 Encounter for screening for lipoid disorders: Secondary | ICD-10-CM

## 2015-10-12 DIAGNOSIS — Z1159 Encounter for screening for other viral diseases: Secondary | ICD-10-CM

## 2015-10-12 DIAGNOSIS — Z5181 Encounter for therapeutic drug level monitoring: Secondary | ICD-10-CM

## 2015-10-12 DIAGNOSIS — Z125 Encounter for screening for malignant neoplasm of prostate: Secondary | ICD-10-CM | POA: Diagnosis not present

## 2015-10-12 DIAGNOSIS — E669 Obesity, unspecified: Secondary | ICD-10-CM

## 2015-10-12 DIAGNOSIS — Z0000 Encounter for general adult medical examination without abnormal findings: Secondary | ICD-10-CM | POA: Diagnosis not present

## 2015-10-12 LAB — COMPREHENSIVE METABOLIC PANEL
ALK PHOS: 78 U/L (ref 39–117)
ALT: 22 U/L (ref 0–53)
AST: 18 U/L (ref 0–37)
Albumin: 4.5 g/dL (ref 3.5–5.2)
BUN: 19 mg/dL (ref 6–23)
CO2: 29 mEq/L (ref 19–32)
Calcium: 9.5 mg/dL (ref 8.4–10.5)
Chloride: 104 mEq/L (ref 96–112)
Creatinine, Ser: 0.97 mg/dL (ref 0.40–1.50)
GFR: 102.61 mL/min (ref >60.00)
GLUCOSE: 87 mg/dL (ref 70–99)
POTASSIUM: 4.6 meq/L (ref 3.5–5.1)
SODIUM: 140 meq/L (ref 135–145)
Total Bilirubin: 1.1 mg/dL (ref 0.2–1.2)
Total Protein: 6.6 g/dL (ref 6.0–8.3)

## 2015-10-12 LAB — LIPID PANEL
CHOL/HDL RATIO: 5
Cholesterol: 199 mg/dL (ref 0–200)
HDL: 41.3 mg/dL (ref >39.00)
LDL CALC: 134 mg/dL — AB (ref 0–99)
NonHDL: 158.18
TRIGLYCERIDES: 119 mg/dL (ref 0.0–149.0)
VLDL: 23.8 mg/dL (ref 0.0–40.0)

## 2015-10-12 LAB — CBC
HCT: 44.4 % (ref 39.0–52.0)
HEMOGLOBIN: 14.4 g/dL (ref 13.0–17.0)
MCHC: 32.5 g/dL (ref 30.0–36.0)
MCV: 87.4 fl (ref 78.0–100.0)
PLATELETS: 192 10*3/uL (ref 150.0–400.0)
RBC: 5.08 Mil/uL (ref 4.22–5.81)
RDW: 13.6 % (ref 11.5–15.5)
WBC: 3.5 10*3/uL — AB (ref 4.0–10.5)

## 2015-10-12 LAB — HEMOGLOBIN A1C: Hgb A1c MFr Bld: 5.7 % (ref 4.6–6.5)

## 2015-10-12 LAB — PSA, MEDICARE: PSA: 0.87 ng/mL (ref 0.10–4.00)

## 2015-10-12 NOTE — Patient Instructions (Addendum)
Nice to see you. We will check lab work for your physical. We will call with the results. Please continue to work on your diet and continued exercise. We will request records from your GI physician for your colonoscopy.

## 2015-10-12 NOTE — Assessment & Plan Note (Signed)
Overall patient is doing quite well. Blood pressure in the normal range. Weight is down slightly from her last visit. Discussed diet and exercise and continuing this. We'll check lab work as outlined below. His health maintenance will be updated. We will request records from his GI doctor for his colonoscopy.

## 2015-10-12 NOTE — Progress Notes (Signed)
Pre visit review using our clinic review tool, if applicable. No additional management support is needed unless otherwise documented below in the visit note. 

## 2015-10-12 NOTE — Progress Notes (Signed)
Patient ID: Matthew Juarez, male   DOB: 25-Dec-1960, 55 y.o.   MRN: [id number]  Matthew Alar, MD Phone: [phone]  Matthew Juarez is a 54 y.o. male who presents today for physical exam.  Exercises by doing physical therapy exercises at home. Diet: Reports he's been cutting back. Drinking more water. Eating more grilled foods and steamed vegetables. No prior PSA check. Colonoscopy when he was 48. We will request records from his GI physician. Reports tetanus vaccination 3 years ago. No prior hepatitis C check. Saw his doctor last month. Saw his dentist earlier this week. Does not smoke cigarettes. No alcohol use. No illicit drug use. Reports chronic issues are stable.  Active Ambulatory Problems    Diagnosis Date Noted  . Chronic back pain   . Closed right ankle fracture 04/19/2012  . Transverse myelitis (HCC) 04/21/2012  . Cardiac arrest (HCC) 06/23/2012  . Hematuria 06/28/2012  . History of pulmonary embolus (PE) 07/23/2012  . Chronic pain syndrome 07/11/2015  . Gout 08/10/2015  . History of hypertension 08/10/2015  . Candidal intertrigo 08/10/2015  . Elevated glucose 09/13/2015  . Routine general medical examination at a health care facility 10/12/2015   Resolved Ambulatory Problems    Diagnosis Date Noted  . Lower paraplegia (HCC) 04/17/2012  . Acute transverse myelitis (HCC) 04/17/2012  . NSVT (nonsustained ventricular tachycardia) (HCC) 04/17/2012  . Acute respiratory failure (HCC) 06/23/2012  . Shock (HCC) 06/23/2012  . Pleural effusion 07/01/2012  . C. difficile 07/03/2012  . C. difficile colitis 07/05/2012   Past Medical History  Diagnosis Date  . Hypertension   . Headache(784.0)   . Arthritis   . History of blood clots   . Hammer toe     Family History  Problem Relation Age of Onset  . Lung cancer Maternal Uncle   . Colon cancer Maternal Aunt   . Lung cancer Maternal Aunt     Social History   Social History  . Marital Status: Married    Spouse  Name: N/A  . Number of Children: N/A  . Years of Education: N/A   Occupational History  . disability    Social History Main Topics  . Smoking status: Former Smoker -- 1 years    Types: Cigarettes    Quit date: 04/01/1983  . Smokeless tobacco: Never Used     Comment: only snmoke 2- cigsper day when he smoked  . Alcohol Use: No  . Drug Use: No  . Sexual Activity: Not on file   Other Topics Concern  . Not on file   Social History Narrative    ROS  General:  Negative for nexplained weight loss, fever Skin: Negative for new or changing mole, sore that won't heal HEENT: Negative for trouble hearing, trouble seeing, ringing in ears, mouth sores, hoarseness, change in voice, dysphagia. CV:  Negative for chest pain, dyspnea, edema, palpitations Resp: Negative for cough, dyspnea, hemoptysis GI: Negative for nausea, vomiting, diarrhea, constipation, abdominal pain, melena, hematochezia. GU: Negative for dysuria, incontinence, urinary hesitance, hematuria, vaginal or penile discharge, polyuria, sexual difficulty, lumps in testicle or breasts MSK: Negative for muscle cramps or aches, positive for joint pain or swelling (stable chronic right ankle issue. Neuro: Positive for weakness (stable chronic issue followed by neurology), Negative for headaches, numbness, dizziness, passing out/fainting Psych: Negative for depression, anxiety, memory problems  Objective  Physical Exam Filed Vitals:   10/12/15 0921  BP: 130/80  Pulse: 63  Temp: 97.9 F (36.6 C)    BP Readings from  Last 3 Encounters:  10/12/15 130/80  09/13/15 118/66  08/10/15 132/80   Wt Readings from Last 3 Encounters:  10/12/15 318 lb (144.244 kg)  08/10/15 321 lb (145.605 kg)  07/11/15 316 lb 1.9 oz (143.391 kg)    Physical Exam  Constitutional: He is well-developed, well-nourished, and in no distress.  HENT:  Head: Normocephalic and atraumatic.  Right Ear: External ear normal.  Left Ear: External ear normal.    Mouth/Throat: Oropharynx is clear and moist. No oropharyngeal exudate.  Eyes: Conjunctivae are normal. Pupils are equal, round, and reactive to light.  Cardiovascular: Normal rate, regular rhythm and normal heart sounds.   Pulmonary/Chest: Effort normal and breath sounds normal.  Abdominal: Soft. He exhibits no distension. There is no tenderness.  Genitourinary: Rectum normal.  Prostate minimally enlarged with no nodules  Musculoskeletal: He exhibits no edema.  Neurological: He is alert.  Able stand from seated position on his own  Skin: Skin is warm and dry. He is not diaphoretic.  Psychiatric: Mood and affect normal.     Assessment/Plan:   Routine general medical examination at a health care facility Overall patient is doing quite well. Blood pressure in the normal range. Weight is down slightly from her last visit. Discussed diet and exercise and continuing this. We'll check lab work as outlined below. His health maintenance will be updated. We will request records from his GI doctor for his colonoscopy.    Orders Placed This Encounter  Procedures  . Comp Met (CMET)  . CBC  . HgB A1c  . Lipid Profile  . PSA, Medicare  . Hepatitis C Antibody   [name], MD [place]

## 2015-10-13 LAB — HEPATITIS C ANTIBODY: HCV AB: NEGATIVE

## 2015-10-22 ENCOUNTER — Telehealth: Payer: Self-pay | Admitting: *Deleted

## 2015-10-22 NOTE — Telephone Encounter (Signed)
Spoke with patient and reviewed results. See result note. thanks

## 2015-10-22 NOTE — Telephone Encounter (Addendum)
Patient requested lab results from 10/15/15 Pt contact [phone]

## 2015-10-24 ENCOUNTER — Telehealth: Payer: Self-pay | Admitting: *Deleted

## 2015-10-24 MED ORDER — ATORVASTATIN CALCIUM 40 MG PO TABS: 40.0000 mg | ORAL_TABLET | Freq: Every day | ORAL | 3 refills | Status: DC

## 2015-10-24 NOTE — Telephone Encounter (Signed)
Per lab note, [facility] [place], please advise and order, thanks

## 2015-10-24 NOTE — Telephone Encounter (Signed)
Lipitor sent to pharmacy.

## 2015-10-24 NOTE — Telephone Encounter (Signed)
Patient stated that he was to start the Lipitor. The pharmacy did not receive the Rx [facility] [place]

## 2015-10-24 NOTE — Telephone Encounter (Signed)
Noted, thanks!

## 2015-11-30 DIAGNOSIS — G360 Neuromyelitis optica [Devic]: Secondary | ICD-10-CM | POA: Diagnosis not present

## 2015-11-30 DIAGNOSIS — Z79899 Other long term (current) drug therapy: Secondary | ICD-10-CM | POA: Diagnosis not present

## 2015-12-10 DIAGNOSIS — G360 Neuromyelitis optica [Devic]: Secondary | ICD-10-CM | POA: Diagnosis not present

## 2015-12-10 DIAGNOSIS — Z79899 Other long term (current) drug therapy: Secondary | ICD-10-CM | POA: Diagnosis not present

## 2016-01-15 ENCOUNTER — Other Ambulatory Visit: Payer: Self-pay | Admitting: Family Medicine

## 2016-01-15 NOTE — Telephone Encounter (Signed)
Sent to pharmacy 

## 2016-01-15 NOTE — Telephone Encounter (Signed)
Can we refill this? 

## 2016-01-16 ENCOUNTER — Ambulatory Visit (INDEPENDENT_AMBULATORY_CARE_PROVIDER_SITE_OTHER): Payer: Medicare Other | Admitting: Family Medicine

## 2016-01-16 ENCOUNTER — Encounter (INDEPENDENT_AMBULATORY_CARE_PROVIDER_SITE_OTHER): Payer: Self-pay

## 2016-01-16 ENCOUNTER — Encounter: Payer: Self-pay | Admitting: Family Medicine

## 2016-01-16 VITALS — BP 114/68 | HR 68 | Temp 97.9°F | Wt 317.1 lb

## 2016-01-16 DIAGNOSIS — R3589 Other polyuria: Secondary | ICD-10-CM

## 2016-01-16 DIAGNOSIS — M1A0710 Idiopathic chronic gout, right ankle and foot, without tophus (tophi): Secondary | ICD-10-CM

## 2016-01-16 DIAGNOSIS — E785 Hyperlipidemia, unspecified: Secondary | ICD-10-CM | POA: Diagnosis not present

## 2016-01-16 DIAGNOSIS — R7303 Prediabetes: Secondary | ICD-10-CM

## 2016-01-16 DIAGNOSIS — G373 Acute transverse myelitis in demyelinating disease of central nervous system: Secondary | ICD-10-CM | POA: Diagnosis not present

## 2016-01-16 DIAGNOSIS — Z23 Encounter for immunization: Secondary | ICD-10-CM

## 2016-01-16 DIAGNOSIS — R358 Other polyuria: Secondary | ICD-10-CM | POA: Diagnosis not present

## 2016-01-16 LAB — POCT URINALYSIS DIPSTICK
Glucose, UA: NEGATIVE
KETONES UA: NEGATIVE
Nitrite, UA: POSITIVE
PH UA: 5.5
Spec Grav, UA: 1.02
Urobilinogen, UA: 4

## 2016-01-16 LAB — COMPREHENSIVE METABOLIC PANEL
ALK PHOS: 106 U/L (ref 39–117)
ALT: 22 U/L (ref 0–53)
AST: 17 U/L (ref 0–37)
Albumin: 4.3 g/dL (ref 3.5–5.2)
BILIRUBIN TOTAL: 1.2 mg/dL (ref 0.2–1.2)
BUN: 15 mg/dL (ref 6–23)
CALCIUM: 9.4 mg/dL (ref 8.4–10.5)
CO2: 29 mEq/L (ref 19–32)
Chloride: 105 mEq/L (ref 96–112)
Creatinine, Ser: 0.91 mg/dL (ref 0.40–1.50)
GFR: 110.36 mL/min (ref >60.00)
Glucose, Bld: 90 mg/dL (ref 70–99)
Potassium: 4.2 mEq/L (ref 3.5–5.1)
Sodium: 140 mEq/L (ref 135–145)
TOTAL PROTEIN: 6.4 g/dL (ref 6.0–8.3)

## 2016-01-16 LAB — URINALYSIS, MICROSCOPIC ONLY

## 2016-01-16 LAB — LDL CHOLESTEROL, DIRECT: Direct LDL: 73 mg/dL

## 2016-01-16 LAB — HEMOGLOBIN A1C: HEMOGLOBIN A1C: 5.7 % (ref 4.6–6.5)

## 2016-01-16 NOTE — Assessment & Plan Note (Signed)
Tolerating Lipitor. We'll check a direct LDL today. Also check CMP.

## 2016-01-16 NOTE — Progress Notes (Signed)
  Matthew AlarEric Kolbee Stallman, MD Phone: [phone]  Matthew Juarez is a 55 y.o. male who presents today for a follow up.  Gout: Patient taking allopurinol daily. No recent gout flares.  Prediabetes: Patient does note some polyuria. No polydipsia or polyphagia. Typically urinates every 1.5 hours. Nocturia 2. Diet consists of mostly vegetables and lean meats that are grilled. Does exercises at home with moving his arms and legs.  Hyperlipidemia: Taking Lipitor. No chest pain, shortness of breath, right upper quadrant pain, or myalgias.  Transverse myelitis: [facility] by neurology. Has not had any new issues. No numbness. Weakness is at baseline. He gets infusions through [facility]. He was to be set up with a company to help teach him how to use his [employer]. He got there and was unable to fit into their vehicle. He is going to get set up with a new company in [place].  PMH: Former smoker   ROS see history of present illness  Objective  Physical Exam Vitals:   01/16/16 1013  BP: 114/68  Pulse: 68  Temp: 97.9 F (36.6 C)    BP Readings from Last 3 Encounters:  01/16/16 114/68  10/12/15 130/80  09/13/15 118/66   Wt Readings from Last 3 Encounters:  01/16/16 (!) 317 lb 2 oz (143.8 kg)  10/12/15 (!) 318 lb (144.2 kg)  08/10/15 (!) 321 lb (145.6 kg)    Physical Exam  Constitutional: No distress.  Cardiovascular: Normal rate, regular rhythm and normal heart sounds.   Pulmonary/Chest: Effort normal and breath sounds normal.  Musculoskeletal: He exhibits no edema.  Neurological: He is alert.  5 out of 5 strength bilateral quads, hamstrings, plantar flexion, and dorsiflexion, sensation light touch intact in bilateral lower extremities, absent patellar reflexes bilaterally  Skin: Skin is warm and dry. He is not diaphoretic.     Assessment/Plan: Please see individual problem list.  Transverse myelitis Stable. Continue to follow [facility]. He'll get set up with the new company for learning  how uses banned.  Gout Well-controlled. Continue allopurinol.  Prediabetes Patient's last A1c 5.7. Does note some polyuria. This could be related to elevation in A1c and blood sugar or could be related to his prostate. We'll check an A1c. We'll also check a urinalysis. If those things are well controlled and normal we could consider Flomax for BPH given mildly enlarged prostate on prior exam.  Hyperlipidemia Tolerating Lipitor. We'll check a direct LDL today. Also check CMP.   Orders Placed This Encounter  Procedures  . Direct LDL  . HgB A1c  . Comprehensive metabolic panel  . POCT Urinalysis Dipstick    Matthew AlarEric Deeana Atwater, MD [facility] [place]

## 2016-01-16 NOTE — Assessment & Plan Note (Signed)
Well controlled. Continue allopurinol.  

## 2016-01-16 NOTE — Assessment & Plan Note (Signed)
Patient's last A1c 5.7. Does note some polyuria. This could be related to elevation in A1c and blood sugar or could be related to his prostate. We'll check an A1c. We'll also check a urinalysis. If those things are well controlled and normal we could consider Flomax for BPH given mildly enlarged prostate on prior exam.

## 2016-01-16 NOTE — Assessment & Plan Note (Signed)
Stable. Continue to follow [facility]. He'll get set up with the new company for learning how uses banned.

## 2016-01-16 NOTE — Patient Instructions (Signed)
Nice to see you. I am glad you are doing well. We will check your urine and some lab work today and call you with the results.

## 2016-01-16 NOTE — Progress Notes (Signed)
Pre visit review using our clinic review tool, if applicable. No additional management support is needed unless otherwise documented below in the visit note. 

## 2016-01-18 LAB — URINE CULTURE

## 2016-01-19 ENCOUNTER — Telehealth: Payer: Self-pay | Admitting: Family Medicine

## 2016-01-19 MED ORDER — CEPHALEXIN 500 MG PO CAPS: 500.0000 mg | ORAL_CAPSULE | Freq: Two times a day (BID) | ORAL | 0 refills | Status: DC

## 2016-01-19 NOTE — Telephone Encounter (Signed)
Called and spoke with patient regarding urine culture results. It returned with an infection. Patient reports he is continuing to have symptoms at this time. I will send then Keflex for him to start.

## 2016-03-20 DIAGNOSIS — Z79891 Long term (current) use of opiate analgesic: Secondary | ICD-10-CM | POA: Diagnosis not present

## 2016-03-20 DIAGNOSIS — G8929 Other chronic pain: Secondary | ICD-10-CM | POA: Diagnosis not present

## 2016-03-20 DIAGNOSIS — G373 Acute transverse myelitis in demyelinating disease of central nervous system: Secondary | ICD-10-CM | POA: Diagnosis not present

## 2016-04-01 DIAGNOSIS — G360 Neuromyelitis optica [Devic]: Secondary | ICD-10-CM | POA: Diagnosis not present

## 2016-04-14 ENCOUNTER — Encounter: Payer: Self-pay | Admitting: Family Medicine

## 2016-04-14 ENCOUNTER — Ambulatory Visit (INDEPENDENT_AMBULATORY_CARE_PROVIDER_SITE_OTHER): Payer: Medicare Other | Admitting: Family Medicine

## 2016-04-14 VITALS — BP 130/88 | HR 67 | Temp 98.0°F | Wt 323.4 lb

## 2016-04-14 DIAGNOSIS — R7303 Prediabetes: Secondary | ICD-10-CM

## 2016-04-14 DIAGNOSIS — E785 Hyperlipidemia, unspecified: Secondary | ICD-10-CM | POA: Diagnosis not present

## 2016-04-14 DIAGNOSIS — Z86711 Personal history of pulmonary embolism: Secondary | ICD-10-CM

## 2016-04-14 DIAGNOSIS — R002 Palpitations: Secondary | ICD-10-CM | POA: Diagnosis not present

## 2016-04-14 NOTE — Patient Instructions (Signed)
Nice to see you. We are going to refer you to cardiology. We'll check lab work and contact you with the results. If you develop persistent palpitations, or develop chest pain, shortness of breath, or any new or changing symptoms please seek medical attention immediately.

## 2016-04-14 NOTE — Assessment & Plan Note (Signed)
Continue to work on diet and exercise. Recheck A1c at next visit.

## 2016-04-14 NOTE — Assessment & Plan Note (Signed)
Tolerating Xarelto. Asymptomatic. Continue Xarelto.

## 2016-04-14 NOTE — Progress Notes (Signed)
Pre visit review using our clinic review tool, if applicable. No additional management support is needed unless otherwise documented below in the visit note. 

## 2016-04-14 NOTE — Assessment & Plan Note (Addendum)
Most recent LDL well controlled. Tolerating Lipitor. Continue Lipitor.

## 2016-04-14 NOTE — Addendum Note (Signed)
Addended by: Birdie SonsSONNENBERG, ERIC G on: 04/14/2016 05:00 PM   Modules accepted: Orders

## 2016-04-14 NOTE — Assessment & Plan Note (Addendum)
Patient has had onset of palpitations over the last several months occurring twice a week. No other symptoms with this. Occur when he's laying in bed. Asymptomatic otherwise. Asymptomatic at this time. Attempted to obtain an EKG in the office though due to technical difficulties we were unable to obtain this. He is regular rate and rhythm on exam. We will have him see cardiology for evaluation. We'll obtain lab work as outlined below. He is given return precautions.

## 2016-04-14 NOTE — Progress Notes (Addendum)
  Matthew Rumps, MD Phone: [phone]  Matthew Juarez is a 56 y.o. male who presents today for follow-up.  Hyperlipidemia: Taking Lipitor. No chest pain or claudication. No right upper quadrant pain. No myalgias.  Prediabetes: Most recent A1c 5.7. Has been cutting back on fried food and eating more fruit and vegetables. Also doing exercises. Is going to start doing water aerobics soon.  History of PE: No chest pain or shortness of breath. No swelling in his legs. No bleeding. Is taking Xarelto.  Patient does note some palpitations for several months that only occur when he goes to bed. Lasts for about 15-20 minutes and then resolves on their own. Does not occur during the day or with exertion. Feels like he has a tremor with it. No other symptoms with this. Drinks 2 cups of coffee a day though no other caffeinated beverages. Occurs about twice a week. No symptoms at this time.  PMH: Former smoker   ROS see history of present illness  Objective  Physical Exam Vitals:   04/14/16 1406  BP: 130/88  Pulse: 67  Temp: 98 F (36.7 C)    BP Readings from Last 3 Encounters:  04/14/16 130/88  01/16/16 114/68  10/12/15 130/80   Wt Readings from Last 3 Encounters:  04/14/16 (!) 323 lb 6.4 oz (146.7 kg)  01/16/16 (!) 317 lb 2 oz (143.8 kg)  10/12/15 (!) 318 lb (144.2 kg)    Physical Exam  Constitutional: No distress.  HENT:  Head: Normocephalic and atraumatic.  Mouth/Throat: Oropharynx is clear and moist. No oropharyngeal exudate.  Eyes: Conjunctivae are normal. Pupils are equal, round, and reactive to light.  Cardiovascular: Normal rate, regular rhythm and normal heart sounds.   Pulmonary/Chest: Effort normal and breath sounds normal.  Musculoskeletal: He exhibits no edema.  Neurological: He is alert.  Skin: He is not diaphoretic.     Assessment/Plan: Please see individual problem list.  History of pulmonary embolus (PE) Tolerating Xarelto. Asymptomatic. Continue  Xarelto.  Hyperlipidemia Most recent LDL well controlled. Tolerating Lipitor. Continue Lipitor.  Prediabetes  Continue to work on diet and exercise. Recheck A1c at next visit.  Palpitations Patient has had onset of palpitations over the last several months occurring twice a week. No other symptoms with this. Occur when he's laying in bed. Asymptomatic otherwise. Asymptomatic at this time. Attempted to obtain an EKG in the office though due to technical difficulties we were unable to obtain this. He is regular rate and rhythm on exam. We will have him see cardiology for evaluation. We'll obtain lab work as outlined below. He is given return precautions.   Orders Placed This Encounter  Procedures  . CBC  . Comp Met (CMET)  . TSH  . Ambulatory referral to Cardiology    Referral Priority:   Routine    Referral Type:   Consultation    Referral Reason:   Specialty Services Required    Requested Specialty:   Cardiology    Number of Visits Requested:   1  . EKG 12-Lead    Matthew Rumps, MD [place]

## 2016-04-15 LAB — COMPREHENSIVE METABOLIC PANEL
ALK PHOS: 79 U/L (ref 39–117)
ALT: 22 U/L (ref 0–53)
AST: 23 U/L (ref 0–37)
Albumin: 4.3 g/dL (ref 3.5–5.2)
BUN: 16 mg/dL (ref 6–23)
CHLORIDE: 105 meq/L (ref 96–112)
CO2: 29 mEq/L (ref 19–32)
Calcium: 9.6 mg/dL (ref 8.4–10.5)
Creatinine, Ser: 0.93 mg/dL (ref 0.40–1.50)
GFR: 107.53 mL/min (ref >60.00)
GLUCOSE: 96 mg/dL (ref 70–99)
POTASSIUM: 4.5 meq/L (ref 3.5–5.1)
SODIUM: 140 meq/L (ref 135–145)
TOTAL PROTEIN: 6.6 g/dL (ref 6.0–8.3)
Total Bilirubin: 1 mg/dL (ref 0.2–1.2)

## 2016-04-15 LAB — CBC
HEMATOCRIT: 41.6 % (ref 39.0–52.0)
HEMOGLOBIN: 13.9 g/dL (ref 13.0–17.0)
MCHC: 33.4 g/dL (ref 30.0–36.0)
MCV: 87.8 fl (ref 78.0–100.0)
Platelets: 169 10*3/uL (ref 150.0–400.0)
RBC: 4.74 Mil/uL (ref 4.22–5.81)
RDW: 13.3 % (ref 11.5–15.5)
WBC: 4.2 10*3/uL (ref 4.0–10.5)

## 2016-04-15 LAB — TSH: TSH: 1.31 u[IU]/mL (ref 0.35–4.50)

## 2016-04-29 ENCOUNTER — Ambulatory Visit: Payer: Medicare Other | Admitting: Internal Medicine

## 2016-04-29 ENCOUNTER — Encounter: Payer: Self-pay | Admitting: Internal Medicine

## 2016-04-29 NOTE — Progress Notes (Signed)
New Outpatient Visit Date: 04/30/2016  Referring Provider: Glori Luis, MD [address] [place], [place] [address]  Chief Complaint: Palpitations  HPI:  Matthew Juarez is a 56 y.o. year-old male with history of hyperlipidemia, prediabetes, transverse myelitis, and cardiac arrest secondary to pulmonary embolism in 05/2012, who has been referred by Dr. Birdie Sons for evaluation of palpitations.  Patient reports racing heart, typically at night when lying in bed.  Does not occur when sitting up.  Episodes last 15-30 minutes and occur about 2x/week.  Symptoms began 3-4 months ago.  Symptoms are stable in frequency and intensity.  He denies associated symptoms including chest pain, shortness of breath, and lightheadedness.  Mr. Burley consumes 2 cups of caffeinated coffee/day.  He does not know of any precipitants for his palpitations, including medication changes or illness.  He denies chest pain and edema except for right ankle swelling following fracture in 2014.  The patient denies history of cardiovascular disease.  He has not worn a heart monitor or undergone stress testing in the past.  Transverse myelitis diagnosed in 2014.  He is able to walk short distances with assistance but is typically in a wheelchair.  He was evaluated by Dr. Birdie Sons earlier this month, with labs (including BMP and TSH - see details below) at that time.  --------------------------------------------------------------------------------------------------  Cardiovascular History & Procedures: Cardiovascular Problems:  Palpitations  Risk Factors:  Hyperlipidemia, male gender, and age > 75  Cath/PCI:  None  CV Surgery:  None  EP Procedures and Devices:  None  Non-Invasive Evaluation(s):  TTE (06/23/12): Small LV cavity with normal wall thickness.  LVEF 65-70%.  Severely dilated RV with severe dysfunction.  Severe RA enlargement.  Mild tricuspid regurgitation.  Elevated central venous  pressure.  Recent CV Pertinent Labs: Lab Results  Component Value Date   CHOL 199 10/12/2015   HDL 41.30 10/12/2015   LDLCALC 134 (H) 10/12/2015   LDLDIRECT 73.0 01/16/2016   TRIG 119.0 10/12/2015   CHOLHDL 5 10/12/2015   INR 2.00 (H) 07/06/2012   K 4.5 04/14/2016   K 4.0 04/12/2012   MG 1.6 06/24/2012   BUN 16 04/14/2016   BUN 14 04/12/2012   CREATININE 0.93 04/14/2016   CREATININE 0.99 04/12/2012    --------------------------------------------------------------------------------------------------  Past Medical History:  Diagnosis Date  . Arthritis   . Chronic back pain   . Gout   . Hammer toe   . Headache(784.0)   . History of blood clots   . Hypertension   . Pulmonary embolism (HCC)   . Transverse myelitis [facility])     Past Surgical History:  Procedure Laterality Date  . HAMMER TOE SURGERY     2016    Outpatient Encounter Prescriptions as of 04/30/2016  Medication Sig  . acetaminophen (TYLENOL) 500 MG tablet Take 500 mg by mouth every 6 (six) hours as needed for mild pain.  Marland Kitchen allopurinol (ZYLOPRIM) 300 MG tablet TAKE 1 TABLET (300 MG TOTAL) BY MOUTH DAILY.  Marland Kitchen atorvastatin (LIPITOR) 40 MG tablet Take 1 tablet (40 mg total) by mouth daily.  . Cholecalciferol (VITAMIN D) 2000 UNITS CAPS Take 2,000 Units by mouth daily.  . Coenzyme Q10 (CO Q-10) 200 MG CAPS Take 1 capsule by mouth daily.  Marland Kitchen esomeprazole (NEXIUM) 20 MG capsule Take 20 mg by mouth daily before breakfast.  . gabapentin (NEURONTIN) 600 MG tablet Take 600 mg by mouth 2 (two) times daily.  . Oxycodone HCl 10 MG TABS Take by mouth.  . rivaroxaban (  XARELTO) 20 MG TABS tablet Take 20 mg by mouth daily with supper.  . [DISCONTINUED] baclofen (LIORESAL) 10 MG tablet Take 5 mg by mouth 3 (three) times daily.  . [DISCONTINUED] nystatin cream (MYCOSTATIN) Apply 1 application topically 2 (two) times daily.   No facility-administered encounter medications on file as of 04/30/2016.     Allergies: Patient has no  known allergies.  Social History   Social History  . Marital status: Married    Spouse name: N/A  . Number of children: N/A  . Years of education: N/A   Occupational History  . disability    Social History Main Topics  . Smoking status: Former Smoker    Years: 1.00    Types: Cigarettes    Quit date: 04/01/1983  . Smokeless tobacco: Never Used     Comment: only snmoke 2- cigsper day when he smoked  . Alcohol use No  . Drug use: No  . Sexual activity: Not on file   Other Topics Concern  . Not on file   Social History Narrative  . No narrative on file    Family History  Problem Relation Age of Onset  . Dementia Mother   . Lung cancer Maternal Uncle   . Colon cancer Maternal Aunt   . Lung cancer Maternal Aunt   . Heart disease Neg Hx     Review of Systems: A 12-system review of systems was performed and was negative except as noted in the HPI.  --------------------------------------------------------------------------------------------------  Physical Exam: BP 110/62 (BP Location: Left Arm, Patient Position: Sitting, Cuff Size: Normal)   Pulse 74   Ht 6\' 5"  (1.956 m)   Wt (!) 323 lb (146.5 kg)   BMI 38.30 kg/m   General:  Obese man, seated comfortably in a wheelchair. HEENT: No conjunctival pallor or scleral icterus.  Moist mucous membranes.  OP clear. Neck: Supple without lymphadenopathy, thyromegaly, JVD, or HJR.  No carotid bruit. Lungs: Normal work of breathing.  Clear to auscultation bilaterally without wheezes or crackles. Heart: Regular rate and rhythm without murmurs, rubs, or gallops.  Non-displaced PMI. Abd: Bowel sounds present.  Soft, NT/ND without hepatosplenomegaly Ext: Trace pretibial edema bilaterally.  Radial, PT, and DP pulses are 2+ bilaterally Skin: warm and dry without rashh Neuro: CNIII-XII intact.  Moves all 4 extremities. Psych: Normal mood and affect.  EKG:  Normal sinus rhythm without significant abnormalities.  CT chest  (01/08/15):  I have personally reviewed the images.  Heart size is normal with mild coronary artery calcification.  Lungs clear.  Questionable left renal cyst.  Geographic fatty infiltration of the liver.  Lab Results  Component Value Date   WBC 4.2 04/14/2016   HGB 13.9 04/14/2016   HCT 41.6 04/14/2016   MCV 87.8 04/14/2016   PLT 169.0 04/14/2016    Lab Results  Component Value Date   NA 140 04/14/2016   K 4.5 04/14/2016   CL 105 04/14/2016   CO2 29 04/14/2016   BUN 16 04/14/2016   CREATININE 0.93 04/14/2016   GLUCOSE 96 04/14/2016   ALT 22 04/14/2016    Lab Results  Component Value Date   CHOL 199 10/12/2015   HDL 41.30 10/12/2015   LDLCALC 134 (H) 10/12/2015   LDLDIRECT 73.0 01/16/2016   TRIG 119.0 10/12/2015   CHOLHDL 5 10/12/2015   Lab Results  Component Value Date   TSH 1.31 04/14/2016   --------------------------------------------------------------------------------------------------  ASSESSMENT AND PLAN: Palpitations Symptoms only present at night, occurring ~2x/week.  Episodes of  heart fluttering often last up to 30 minutes.  Exam and EKG today are unremarkable.  Recent labs show normal electrolytes and TSH.  We have agreed to obtain a 14-day event monitor to further characterize these episodes.  We will also repeat an echocardiogram, given history of right heart enlargement in the setting of acute PE in 2014.  I encouraged the patient to reduce/stop his caffeine intake.  We will not add any medications at this time.  Pulmonary embolism and right heart failure Patient has recovered well from his massive PE with cardiac arrest in 2014.  Significant RA and RV enlargement was noted by echo at that time.  We will reassess this with echo.  I agree with continuation of indefinite anticoagulation; patient is tolerating rivaroxaban well.  Coronary artery calcification Mild CAC noted on chest CT in 2016.  He is currently on high-intensity statin therapy.  He does not  have any symptoms to suggest significant coronary insufficiency, though transverse myelitis limits his functional capacity.  We will defer ischemia evaluation pending echo and event monitor.  Follow-up: Return to clinic in 6 weeks.  Yvonne Kendallhristopher Mirakle Tomlin, MD 04/30/2016 10:03 AM

## 2016-04-30 ENCOUNTER — Other Ambulatory Visit: Payer: Self-pay | Admitting: Family Medicine

## 2016-04-30 ENCOUNTER — Ambulatory Visit (INDEPENDENT_AMBULATORY_CARE_PROVIDER_SITE_OTHER): Payer: Medicare Other | Admitting: Internal Medicine

## 2016-04-30 ENCOUNTER — Ambulatory Visit (INDEPENDENT_AMBULATORY_CARE_PROVIDER_SITE_OTHER): Payer: BC Managed Care – PPO

## 2016-04-30 ENCOUNTER — Other Ambulatory Visit: Payer: Self-pay

## 2016-04-30 ENCOUNTER — Encounter: Payer: Self-pay | Admitting: Internal Medicine

## 2016-04-30 VITALS — BP 110/62 | HR 74 | Ht 77.0 in | Wt 323.0 lb

## 2016-04-30 DIAGNOSIS — Z86711 Personal history of pulmonary embolism: Secondary | ICD-10-CM

## 2016-04-30 DIAGNOSIS — I2584 Coronary atherosclerosis due to calcified coronary lesion: Secondary | ICD-10-CM

## 2016-04-30 DIAGNOSIS — I50810 Right heart failure, unspecified: Secondary | ICD-10-CM | POA: Insufficient documentation

## 2016-04-30 DIAGNOSIS — R002 Palpitations: Secondary | ICD-10-CM

## 2016-04-30 DIAGNOSIS — I2510 Atherosclerotic heart disease of native coronary artery without angina pectoris: Secondary | ICD-10-CM

## 2016-04-30 MED ORDER — RIVAROXABAN 20 MG PO TABS: 20.0000 mg | ORAL_TABLET | Freq: Every day | ORAL | 11 refills | Status: DC

## 2016-04-30 NOTE — Telephone Encounter (Signed)
In epic under historical provider

## 2016-04-30 NOTE — Telephone Encounter (Signed)
Pt called requesting a refill on his rivaroxaban (XARELTO) 20 MG TABS tablet. Please advise, thank you!  Call pt @ [phone]  Pharmacy - [id number] [place], [place] - [id number]

## 2016-04-30 NOTE — Patient Instructions (Signed)
Medication Instructions:  Your physician recommends that you continue on your current medications as directed. Please refer to the Current Medication list given to you today.   Labwork: none  Testing/Procedures: Your physician has recommended that you wear an 14 DAY ZIO event monitor. Event monitors are medical devices that record the heart's electrical activity. Doctors most often us these monitors to diagnose arrhythmias. Arrhythmias are problems with the speed or rhythm of the heartbeat. The monitor is a small, portable device. You can wear one while you do your normal daily activities. This is usually used to diagnose what is causing palpitations/syncope (passing out).   Your physician has requested that you have an echocardiogram. Echocardiography is a painless test that uses sound waves to create images of your heart. It provides your doctor with information about the size and shape of your heart and how well your heart's chambers and valves are working. This procedure takes approximately one hour. There are no restrictions for this procedure.     Follow-Up: Your physician recommends that you schedule a follow-up appointment in: 6 WEEKS WITH DR END.   Any Other Special Instructions Will Be Listed Below:  - Dr End recommends you decrease your caffeine intake.   Echocardiogram An echocardiogram, or echocardiography, uses sound waves (ultrasound) to produce an image of your heart. The echocardiogram is simple, painless, obtained within a short period of time, and offers valuable information to your health care provider. The images from an echocardiogram can provide information such as:  Evidence of coronary artery disease (CAD).  Heart size.  Heart muscle function.  Heart valve function.  Aneurysm detection.  Evidence of a past heart attack.  Fluid buildup around the heart.  Heart muscle thickening.  Assess heart valve function. Tell a health care provider  about:  Any allergies you have.  All medicines you are taking, including vitamins, herbs, eye drops, creams, and over-the-counter medicines.  Any problems you or family members have had with anesthetic medicines.  Any blood disorders you have.  Any surgeries you have had.  Any medical conditions you have.  Whether you are pregnant or may be pregnant. What happens before the procedure? No special preparation is needed. Eat and drink normally. What happens during the procedure?  In order to produce an image of your heart, gel will be applied to your chest and a wand-like tool (transducer) will be moved over your chest. The gel will help transmit the sound waves from the transducer. The sound waves will harmlessly bounce off your heart to allow the heart images to be captured in real-time motion. These images will then be recorded.  You may need an IV to receive a medicine that improves the quality of the pictures. What happens after the procedure? You may return to your normal schedule including diet, activities, and medicines, unless your health care provider tells you otherwise. This information is not intended to replace advice given to you by your health care provider. Make sure you discuss any questions you have with your health care provider. Document Released: 03/14/2000 Document Revised: 11/03/2015 Document Reviewed: 11/22/2012 Elsevier Interactive Patient Education  2017 [employer]    If you need a refill on your cardiac medications before your next appointment, please call your pharmacy.

## 2016-05-14 DIAGNOSIS — Z86711 Personal history of pulmonary embolism: Secondary | ICD-10-CM

## 2016-05-14 DIAGNOSIS — I50810 Right heart failure, unspecified: Secondary | ICD-10-CM

## 2016-05-14 DIAGNOSIS — R002 Palpitations: Secondary | ICD-10-CM | POA: Diagnosis not present

## 2016-05-20 DIAGNOSIS — R002 Palpitations: Secondary | ICD-10-CM | POA: Diagnosis not present

## 2016-05-26 ENCOUNTER — Other Ambulatory Visit: Payer: Medicare Other

## 2016-05-27 ENCOUNTER — Encounter: Payer: Self-pay | Admitting: Family Medicine

## 2016-05-27 ENCOUNTER — Ambulatory Visit (INDEPENDENT_AMBULATORY_CARE_PROVIDER_SITE_OTHER): Payer: Medicare Other | Admitting: Family Medicine

## 2016-05-27 VITALS — BP 102/68 | HR 78 | Temp 100.1°F | Wt 320.0 lb

## 2016-05-27 DIAGNOSIS — I2510 Atherosclerotic heart disease of native coronary artery without angina pectoris: Secondary | ICD-10-CM | POA: Diagnosis not present

## 2016-05-27 DIAGNOSIS — I2584 Coronary atherosclerosis due to calcified coronary lesion: Secondary | ICD-10-CM | POA: Diagnosis not present

## 2016-05-27 DIAGNOSIS — J111 Influenza due to unidentified influenza virus with other respiratory manifestations: Secondary | ICD-10-CM | POA: Diagnosis not present

## 2016-05-27 DIAGNOSIS — R059 Cough, unspecified: Secondary | ICD-10-CM

## 2016-05-27 DIAGNOSIS — R05 Cough: Secondary | ICD-10-CM

## 2016-05-27 LAB — POCT INFLUENZA A/B
Influenza A, POC: NEGATIVE
Influenza B, POC: POSITIVE — AB

## 2016-05-27 MED ORDER — OSELTAMIVIR PHOSPHATE 75 MG PO CAPS: 75.0000 mg | ORAL_CAPSULE | Freq: Two times a day (BID) | ORAL | 0 refills | Status: DC

## 2016-05-27 NOTE — Assessment & Plan Note (Signed)
Patient's symptoms consistent with influenza and he had positive rapid flu test for influenza type a. Given his heart failure history and obesity we will treat with Tamiflu. I discussed prophylactic treatment for his wife though on review of her chart as she is seen by one of the other PCPs in this office it appears she has recently been treated for the flu possibly. I did not discuss that with the patient though I did advise him to ask her if she has taken Tamiflu recently and if she has not letting us know so we can prophylactically treat her. Patient is given return precautions.

## 2016-05-27 NOTE — Progress Notes (Signed)
Pre visit review using our clinic review tool, if applicable. No additional management support is needed unless otherwise documented below in the visit note. 

## 2016-05-27 NOTE — Patient Instructions (Signed)
Nice to see you. We'll treat you for the flu with Tamiflu. You can continue Robitussin for cough. If you develop shortness of breath, persistent fevers, or any new or changing symptoms please seek medical attention immediately.

## 2016-05-27 NOTE — Progress Notes (Signed)
  Marikay AlarEric Jashaun Penrose, MD Phone: [phone]  Loma MessingJeffrey Bega is a 56 y.o. male who presents today for same-day visit.  Patient notes onset of symptoms 5 days ago. Started with cough and chest congestion. Cough is productive of yellowish mucus. He notes no head congestion, rhinorrhea, postnasal drip, shortness of breath, or chest pain. He does note some left ear discomfort. MAXIMUM TEMPERATURE at home was 99.9F. He notes no sick contacts. He notes no body aches. No chills. Has been taking Robitussin for the cough.  PMH: Former smoker   ROS see history of present illness  Objective  Physical Exam Vitals:   05/27/16 0937  BP: 102/68  Pulse: 78  Temp: 100.1 F (37.8 C)    BP Readings from Last 3 Encounters:  05/27/16 102/68  04/30/16 110/62  04/14/16 130/88   Wt Readings from Last 3 Encounters:  05/27/16 (!) 320 lb (145.2 kg)  04/30/16 (!) 323 lb (146.5 kg)  04/14/16 (!) 323 lb 6.4 oz (146.7 kg)    Physical Exam  Constitutional: No distress.  HENT:  Head: Normocephalic and atraumatic.  Mild postnasal drip, no oropharyngeal erythema or exudate  Eyes: Conjunctivae are normal. Pupils are equal, round, and reactive to light.  Cardiovascular: Normal rate, regular rhythm and normal heart sounds.   Pulmonary/Chest: Effort normal and breath sounds normal.  Neurological: He is alert.  Skin: Skin is warm and dry. He is not diaphoretic.     Assessment/Plan: Please see individual problem list.  Influenza Patient's symptoms consistent with influenza and he had positive rapid flu test for influenza type a. Given his heart failure history and obesity we will treat with Tamiflu. I discussed prophylactic treatment for his wife though on review of her chart as she is seen by one of the other PCPs in this office it appears she has recently been treated for the flu possibly. I did not discuss that with the patient though I did advise him to ask her if she has taken Tamiflu recently and if she  has not letting us know so we can prophylactically treat her. Patient is given return precautions.   Orders Placed This Encounter  Procedures  . POCT Influenza A/B    Meds ordered this encounter  Medications  . oseltamivir (TAMIFLU) 75 MG capsule    Sig: Take 1 capsule (75 mg total) by mouth 2 (two) times daily.    Dispense:  10 capsule    Refill:  0    Marikay AlarEric Vendetta Pittinger, MD [facility] [place]

## 2016-06-09 DIAGNOSIS — Z79891 Long term (current) use of opiate analgesic: Secondary | ICD-10-CM | POA: Diagnosis not present

## 2016-06-09 DIAGNOSIS — G8929 Other chronic pain: Secondary | ICD-10-CM | POA: Diagnosis not present

## 2016-06-09 DIAGNOSIS — R279 Unspecified lack of coordination: Secondary | ICD-10-CM | POA: Diagnosis not present

## 2016-06-09 DIAGNOSIS — M25511 Pain in right shoulder: Secondary | ICD-10-CM | POA: Diagnosis not present

## 2016-06-09 DIAGNOSIS — M25512 Pain in left shoulder: Secondary | ICD-10-CM | POA: Diagnosis not present

## 2016-06-09 DIAGNOSIS — K5900 Constipation, unspecified: Secondary | ICD-10-CM | POA: Diagnosis not present

## 2016-06-09 DIAGNOSIS — G360 Neuromyelitis optica [Devic]: Secondary | ICD-10-CM | POA: Diagnosis not present

## 2016-06-09 DIAGNOSIS — Z79899 Other long term (current) drug therapy: Secondary | ICD-10-CM | POA: Diagnosis not present

## 2016-06-11 ENCOUNTER — Ambulatory Visit: Payer: Medicare Other | Admitting: Internal Medicine

## 2016-06-20 ENCOUNTER — Other Ambulatory Visit: Payer: Self-pay

## 2016-06-20 ENCOUNTER — Ambulatory Visit (INDEPENDENT_AMBULATORY_CARE_PROVIDER_SITE_OTHER): Payer: Medicare Other

## 2016-06-20 DIAGNOSIS — R002 Palpitations: Secondary | ICD-10-CM

## 2016-06-20 DIAGNOSIS — Z86711 Personal history of pulmonary embolism: Secondary | ICD-10-CM | POA: Diagnosis not present

## 2016-06-20 DIAGNOSIS — I50810 Right heart failure, unspecified: Secondary | ICD-10-CM | POA: Diagnosis not present

## 2016-06-25 ENCOUNTER — Ambulatory Visit (INDEPENDENT_AMBULATORY_CARE_PROVIDER_SITE_OTHER): Payer: Medicare Other | Admitting: Internal Medicine

## 2016-06-25 ENCOUNTER — Encounter: Payer: Self-pay | Admitting: Internal Medicine

## 2016-06-25 VITALS — BP 142/84 | HR 66 | Ht 77.0 in | Wt 322.0 lb

## 2016-06-25 DIAGNOSIS — I2584 Coronary atherosclerosis due to calcified coronary lesion: Secondary | ICD-10-CM | POA: Diagnosis not present

## 2016-06-25 DIAGNOSIS — I2510 Atherosclerotic heart disease of native coronary artery without angina pectoris: Secondary | ICD-10-CM

## 2016-06-25 DIAGNOSIS — R002 Palpitations: Secondary | ICD-10-CM | POA: Diagnosis not present

## 2016-06-25 DIAGNOSIS — R030 Elevated blood-pressure reading, without diagnosis of hypertension: Secondary | ICD-10-CM

## 2016-06-25 DIAGNOSIS — Z86711 Personal history of pulmonary embolism: Secondary | ICD-10-CM

## 2016-06-25 NOTE — Progress Notes (Signed)
Follow-up Outpatient Visit Date: 06/25/2016  Primary Care Provider: [name], MD [address] [place] [address]  Chief Complaint: Follow-up palpitations  HPI:  [name] is a 56 y.o. year-old male with history of hyperlipidemia, prediabetes, transverse myelitis, and cardiac arrest secondary to pulmonary embolism in 05/2012, who presents for follow-up of palpitations. Also the patient on 04/30/16, which time he described occasional palpitations, predominantly at night, lasting about 15-30 minutes and occurring about twice a week. We obtain a transthoracic echocardiogram and event monitor, which revealed PACs and PVCs as well as brief runs of paroxysmal SVT. Since her last visit, [name] has been able to cut back on his caffeine consumption and notes improvement in palpitations. The frequency and duration have decreased. His last episode was about a week ago. He denies accompanying symptoms including chest pain, shortness of breath, and lightheadedness. He continues to have some mild leg swelling, though this is well-controlled with compression stockings. He remains on rivaroxaban for his history of PE. He has not had any significant bleeding.  --------------------------------------------------------------------------------------------------  Cardiovascular History & Procedures: Cardiovascular Problems:  Palpitations  Massive pulmonary embolism (05/2012)  Risk Factors:  Hyperlipidemia, male gender, and age > 49  Cath/PCI:  None  CV Surgery:  None  EP Procedures and Devices:  14-day event monitor (04/30/16): Normal sinus rhythm with rare supraventricular and ventricular ectopy. Single episode of brief SVT lasting 7 beats, which did not correspond patient reported symptoms.  Non-Invasive Evaluation(s):  TTE (06/20/16): Normal LV size and function (EF 60-65%) with normal wall motion and grade 1 diastolic dysfunction. Question possible mild enlargement of  the right ventricle, suboptimally imaged. Unable to assess PA pressure. No significant valvular abnormalities.  TTE (06/23/12): Small LV cavity with normal wall thickness.  LVEF 65-70%.  Severely dilated RV with severe dysfunction.  Severe RA enlargement.  Mild tricuspid regurgitation.  Elevated central venous pressure.  Recent CV Pertinent Labs: Lab Results  Component Value Date   CHOL 199 10/12/2015   HDL 41.30 10/12/2015   LDLCALC 134 (H) 10/12/2015   LDLDIRECT 73.0 01/16/2016   TRIG 119.0 10/12/2015   CHOLHDL 5 10/12/2015   INR 2.00 (H) 07/06/2012   K 4.5 04/14/2016   K 4.0 04/12/2012   MG 1.6 06/24/2012   BUN 16 04/14/2016   BUN 14 04/12/2012   CREATININE 0.93 04/14/2016   CREATININE 0.99 04/12/2012    Past medical and surgical history were reviewed and updated in EPIC.  Outpatient Encounter Prescriptions as of 06/25/2016  Medication Sig  . acetaminophen (TYLENOL) 500 MG tablet Take 500 mg by mouth every 6 (six) hours as needed for mild pain.  Marland Kitchen allopurinol (ZYLOPRIM) 300 MG tablet TAKE 1 TABLET (300 MG TOTAL) BY MOUTH DAILY.  Marland Kitchen atorvastatin (LIPITOR) 40 MG tablet Take 1 tablet (40 mg total) by mouth daily.  . Cholecalciferol (VITAMIN D) 2000 UNITS CAPS Take 2,000 Units by mouth daily.  . Coenzyme Q10 (CO Q-10) 200 MG CAPS Take 1 capsule by mouth daily.  Marland Kitchen esomeprazole (NEXIUM) 20 MG capsule Take 20 mg by mouth daily before breakfast.  . gabapentin (NEURONTIN) 600 MG tablet Take 600 mg by mouth 2 (two) times daily.  . Oxycodone HCl 10 MG TABS Take by mouth.  . rivaroxaban (XARELTO) 20 MG TABS tablet Take 1 tablet (20 mg total) by mouth daily with supper.  . [DISCONTINUED] oseltamivir (TAMIFLU) 75 MG capsule Take 1 capsule (75 mg total) by mouth 2 (two) times daily. (Patient not taking: Reported  on 06/25/2016)   No facility-administered encounter medications on file as of 06/25/2016.     Allergies: Patient has no known allergies.  Social History   Social History  .  Marital status: Married    Spouse name: N/A  . Number of children: N/A  . Years of education: N/A   Occupational History  . disability    Social History Main Topics  . Smoking status: Former Smoker    Years: 1.00    Types: Cigarettes    Quit date: 04/01/1983  . Smokeless tobacco: Never Used     Comment: only snmoke 2- cigsper day when he smoked  . Alcohol use No  . Drug use: No  . Sexual activity: Not on file   Other Topics Concern  . Not on file   Social History Narrative  . No narrative on file    Family History  Problem Relation Age of Onset  . Dementia Mother   . Lung cancer Maternal Uncle   . Colon cancer Maternal Aunt   . Lung cancer Maternal Aunt   . Heart disease Neg Hx     Review of Systems: A 12-system review of systems was performed and was negative except as noted in the HPI.  --------------------------------------------------------------------------------------------------  Physical Exam: BP (!) 142/84 (BP Location: Left Arm, Patient Position: Sitting, Cuff Size: Large)   Pulse 66   Ht 6\' 5"  (1.956 m)   Wt (!) 322 lb (146.1 kg)   BMI 38.18 kg/m   General:  Obese man, seated comfortably in a wheelchair. HEENT: No conjunctival pallor or scleral icterus.  Moist mucous membranes.  OP clear. Neck: Supple without lymphadenopathy, thyromegaly, JVD, or HJR. Lungs: Normal work of breathing.  Clear to auscultation bilaterally without wheezes or crackles. Heart: Regular rate and rhythm without murmurs, rubs, or gallops. Unable to assess PMI due to body habitus. Abd: Bowel sounds present.  Soft, NT/ND. Unable to assess hepatosplenomegaly due to body habitus Ext: Trace pretibial edema bilaterally with.  Radial, PT, and DP pulses are 2+ bilaterally. Skin: warm and dry without rash  Lab Results  Component Value Date   WBC 4.2 04/14/2016   HGB 13.9 04/14/2016   HCT 41.6 04/14/2016   MCV 87.8 04/14/2016   PLT 169.0 04/14/2016    Lab Results  Component  Value Date   NA 140 04/14/2016   K 4.5 04/14/2016   CL 105 04/14/2016   CO2 29 04/14/2016   BUN 16 04/14/2016   CREATININE 0.93 04/14/2016   GLUCOSE 96 04/14/2016   ALT 22 04/14/2016    Lab Results  Component Value Date   CHOL 199 10/12/2015   HDL 41.30 10/12/2015   LDLCALC 134 (H) 10/12/2015   LDLDIRECT 73.0 01/16/2016   TRIG 119.0 10/12/2015   CHOLHDL 5 10/12/2015   --------------------------------------------------------------------------------------------------  ASSESSMENT AND PLAN: Palpitations Symptoms have improved with decreased caffeine use. Recent monitor revealed rare PACs and PVCs as well as a single episode of brief SVT. We discussed treatment options, including monitoring with avoidance of caffeine as well as addition of beta blocker or calcium channel blocker. Given that the palpitations are quite rare and self-limiting without concerning symptoms, [name] does not wish to begin pharmacologic therapy at this time. He should alert [place] if the palpitations become more frequent or severe.  History of pulmonary embolism Recent echo was limited for evaluation of the RV. Question mild enlargement of the right ventricle but otherwise normal contraction. Patient has chronic mild lower extremity edema but otherwise  no symptoms of significant right heart failure. He should continue with anticoagulation.  Coronary artery calcification Mild CAC incidentally noted on chest CT in 2016. No symptoms of obstructive CAD, though mobility is limited by transverse myelitis. We will continue with primary prevention including high intensity statin therapy. No aspirin, given the patient is on rivaroxaban.  Elevated blood pressure BP mildly elevated today. Blood pressure has been normal at previous visits over the last year. Defer management to Dr. Birdie SonsSonnenberg.  Follow-up: Return to clinic in 6 months.  Yvonne Kendallhristopher Odetta Forness, MD 06/25/2016 10:08 AM

## 2016-06-25 NOTE — Patient Instructions (Signed)

## 2016-06-26 ENCOUNTER — Encounter: Payer: Self-pay | Admitting: Internal Medicine

## 2016-06-30 ENCOUNTER — Ambulatory Visit (INDEPENDENT_AMBULATORY_CARE_PROVIDER_SITE_OTHER): Payer: Medicare Other | Admitting: Family Medicine

## 2016-06-30 ENCOUNTER — Encounter: Payer: Self-pay | Admitting: Family Medicine

## 2016-06-30 DIAGNOSIS — I2584 Coronary atherosclerosis due to calcified coronary lesion: Secondary | ICD-10-CM

## 2016-06-30 DIAGNOSIS — IMO0001 Reserved for inherently not codable concepts without codable children: Secondary | ICD-10-CM

## 2016-06-30 DIAGNOSIS — Z6838 Body mass index (BMI) 38.0-38.9, adult: Secondary | ICD-10-CM

## 2016-06-30 DIAGNOSIS — G373 Acute transverse myelitis in demyelinating disease of central nervous system: Secondary | ICD-10-CM | POA: Diagnosis not present

## 2016-06-30 DIAGNOSIS — R002 Palpitations: Secondary | ICD-10-CM | POA: Diagnosis not present

## 2016-06-30 DIAGNOSIS — E6609 Other obesity due to excess calories: Secondary | ICD-10-CM | POA: Diagnosis not present

## 2016-06-30 DIAGNOSIS — I2510 Atherosclerotic heart disease of native coronary artery without angina pectoris: Secondary | ICD-10-CM | POA: Diagnosis not present

## 2016-06-30 DIAGNOSIS — E66812 Obesity, class 2: Secondary | ICD-10-CM | POA: Insufficient documentation

## 2016-06-30 DIAGNOSIS — M1A0710 Idiopathic chronic gout, right ankle and foot, without tophus (tophi): Secondary | ICD-10-CM | POA: Diagnosis not present

## 2016-06-30 DIAGNOSIS — E669 Obesity, unspecified: Secondary | ICD-10-CM | POA: Insufficient documentation

## 2016-06-30 MED ORDER — KETOCONAZOLE 2 % EX CREA: 1.0000 "application " | TOPICAL_CREAM | Freq: Every day | CUTANEOUS | 0 refills | Status: DC

## 2016-06-30 NOTE — Assessment & Plan Note (Signed)
Currently stable. He is following with neurology for this. Currently on Rituxan. He is going to consider physical therapy.

## 2016-06-30 NOTE — Progress Notes (Signed)
Pre visit review using our clinic review tool, if applicable. No additional management support is needed unless otherwise documented below in the visit note. 

## 2016-06-30 NOTE — Assessment & Plan Note (Signed)
Weight has been relatively stable. Discussed continued diet changes. Continue exercise as well.

## 2016-06-30 NOTE — Patient Instructions (Signed)
Nice to see you. We will try ketoconazole for your jock itch. If this does not help when you get the symptoms please let us know. Please try to stay active and monitor your diet. Please monitor for recurrence of your palpitations and if they do recur please let us know. If they persist and do not go away quickly please seek medical attention.

## 2016-06-30 NOTE — Progress Notes (Signed)
  [name], MD Phone: [phone]  Matthew Juarez is a 56 y.o. male who presents today for follow-up.  Transverse myelitis: Patient notes no new weakness. No numbness. He is following with neurology at [facility]. They're continuing Rituxan. He gets his next infusion in May. He's thinking about doing physical therapy.  Gout: Currently taking allopurinol. It has been a number of years since his last gout attack. His attacks are typically in the right big toe. Renal function recently checked.  Palpitations: He saw cardiology. Had an echo and a monitor in place. Found to have some PACs and PVCs and also some brief runs of paroxysmal SVT. He cut down on his caffeine intake and has not had any symptoms since then. No chest pain or shortness of breath.  Patient has been working on his diet. Having more smoothies. More fruits and vegetables. He is exercising at home. No polyuria or polydipsia.  Patient reports occasional issues with jock itch. He will get a rash and some itching in his inguinal area. No symptoms now. In the past has been treated with topical creams with good benefit.  PMH: Former smoker   ROS see history of present illness  Objective  Physical Exam Vitals:   06/30/16 0907  BP: 102/68  Pulse: 74  Temp: 98.4 F (36.9 C)    BP Readings from Last 3 Encounters:  06/30/16 102/68  06/25/16 (!) 142/84  05/27/16 102/68   Wt Readings from Last 3 Encounters:  06/30/16 (!) 323 lb (146.5 kg)  06/25/16 (!) 322 lb (146.1 kg)  05/27/16 (!) 320 lb (145.2 kg)    Physical Exam  Constitutional: No distress.  HENT:  Head: Normocephalic and atraumatic.  Mouth/Throat: Oropharynx is clear and moist. No oropharyngeal exudate.  Eyes: Conjunctivae are normal. Pupils are equal, round, and reactive to light.  Cardiovascular: Normal rate, regular rhythm and normal heart sounds.   Pulmonary/Chest: Effort normal and breath sounds normal.  Musculoskeletal: He exhibits no edema.    Neurological: He is alert.  CN 2-12 intact, 5/5 strength in bilateral biceps, triceps, grip, quads, hamstrings, plantar and dorsiflexion, sensation to light touch intact in bilateral UE and LE  Skin: He is not diaphoretic.   no inguinal rash noted.   Assessment/Plan: Please see individual problem list.  Transverse myelitis Currently stable. He is following with neurology for this. Currently on Rituxan. He is going to consider physical therapy.  Obesity Weight has been relatively stable. Discussed continued diet changes. Continue exercise as well.  Palpitations No recurrence since cutting out caffeine. He'll monitor for recurrence.  Gout Asymptomatic. No recent flares. He'll continue allopurinol. Plan to check uric acid at his physical in July.  No rash noted today on exam. Given ketoconazole to use as needed. Advised if no improvement when using this he should let us know.  No orders of the defined types were placed in this encounter.   Meds ordered this encounter  Medications  . ketoconazole (NIZORAL) 2 % cream    Sig: Apply 1 application topically daily.    Dispense:  15 g    Refill:  0   [name], MD [facility]

## 2016-06-30 NOTE — Assessment & Plan Note (Signed)
Asymptomatic. No recent flares. He'll continue allopurinol. Plan to check uric acid at his physical in July.

## 2016-06-30 NOTE — Assessment & Plan Note (Signed)
No recurrence since cutting out caffeine. He'll monitor for recurrence.

## 2016-07-30 DIAGNOSIS — G360 Neuromyelitis optica [Devic]: Secondary | ICD-10-CM | POA: Diagnosis not present

## 2016-07-30 DIAGNOSIS — Z79899 Other long term (current) drug therapy: Secondary | ICD-10-CM | POA: Diagnosis not present

## 2016-08-14 ENCOUNTER — Telehealth: Payer: Self-pay | Admitting: Family Medicine

## 2016-08-14 NOTE — Telephone Encounter (Signed)
Left pt message asking to call Allison back directly at [phone] to schedule AWV. Thanks! °

## 2016-08-14 NOTE — Telephone Encounter (Signed)
Started Medicare 07/30/15 and per pt Medicare is [employer]. Ok to have AWV  Scheduled 08/21/16

## 2016-08-14 NOTE — Telephone Encounter (Signed)
Please make sure Medicare is primary and bcbs is secondary

## 2016-08-21 ENCOUNTER — Ambulatory Visit (INDEPENDENT_AMBULATORY_CARE_PROVIDER_SITE_OTHER): Payer: Medicare Other

## 2016-08-21 VITALS — BP 122/82 | HR 62 | Temp 98.2°F | Resp 14 | Ht 77.0 in | Wt 321.1 lb

## 2016-08-21 DIAGNOSIS — Z0000 Encounter for general adult medical examination without abnormal findings: Secondary | ICD-10-CM | POA: Diagnosis not present

## 2016-08-21 NOTE — Progress Notes (Signed)
Subjective:   Matthew Juarez is a 56 y.o. male who presents for an Initial Medicare Annual Wellness Visit.  Review of Systems  No ROS.  Medicare Wellness Visit. Cardiac Risk Factors include: advanced age (>5255men, 90>65 women);male gender    Objective:    Today's Vitals   08/21/16 1042  BP: 122/82  Pulse: 62  Resp: 14  Temp: 98.2 F (36.8 C)  TempSrc: Oral  SpO2: 97%  Weight: (!) 321 lb 1.9 oz (145.7 kg)  Height: 6\' 5"  (1.956 m)   Body mass index is 38.08 kg/m.  Current Medications (verified) Outpatient Encounter Prescriptions as of 08/21/2016  Medication Sig  . acetaminophen (TYLENOL) 500 MG tablet Take 500 mg by mouth every 6 (six) hours as needed for mild pain.  Marland Kitchen. allopurinol (ZYLOPRIM) 300 MG tablet TAKE 1 TABLET (300 MG TOTAL) BY MOUTH DAILY.  Marland Kitchen. atorvastatin (LIPITOR) 40 MG tablet Take 1 tablet (40 mg total) by mouth daily.  . Cholecalciferol (VITAMIN D) 2000 UNITS CAPS Take 2,000 Units by mouth daily.  . Coenzyme Q10 (CO Q-10) 200 MG CAPS Take 1 capsule by mouth daily.  Marland Kitchen. esomeprazole (NEXIUM) 20 MG capsule Take 20 mg by mouth daily before breakfast.  . gabapentin (NEURONTIN) 600 MG tablet Take 600 mg by mouth 2 (two) times daily.  Marland Kitchen. ketoconazole (NIZORAL) 2 % cream Apply 1 application topically daily.  . Oxycodone HCl 10 MG TABS Take by mouth.  . rivaroxaban (XARELTO) 20 MG TABS tablet Take 1 tablet (20 mg total) by mouth daily with supper.   No facility-administered encounter medications on file as of 08/21/2016.     Allergies (verified) Patient has no known allergies.   History: Past Medical History:  Diagnosis Date  . Arthritis   . Chronic back pain   . Gout   . Hammer toe   . Headache(784.0)   . History of blood clots   . Hypertension   . Pulmonary embolism (HCC)   . Transverse myelitis [facility](HCC)    Past Surgical History:  Procedure Laterality Date  . HAMMER TOE SURGERY     2016   Family History  Problem Relation Age of Onset  . Dementia Mother     . Lung cancer Maternal Uncle   . Colon cancer Maternal Aunt   . Lung cancer Maternal Aunt   . Heart disease Neg Hx    Social History   Occupational History  . disability    Social History Main Topics  . Smoking status: Former Smoker    Years: 1.00    Types: Cigarettes    Quit date: 04/01/1983  . Smokeless tobacco: Never Used     Comment: only snmoke 2- cigsper day when he smoked  . Alcohol use No  . Drug use: No  . Sexual activity: Not Currently   Tobacco Counseling Counseling given: Not Answered   Activities of Daily Living In your present state of health, do you have any difficulty performing the following activities: 08/21/2016  Hearing? N  Vision? N  Difficulty concentrating or making decisions? N  Walking or climbing stairs? Y  Dressing or bathing? N  Doing errands, shopping? Y  Preparing Food and eating ? N  Using the Toilet? N  In the past six months, have you accidently leaked urine? N  Do you have problems with loss of bowel control? N  Managing your Medications? N  Managing your Finances? N  Housekeeping or managing your Housekeeping? Y  Some recent data might be hidden  Immunizations and Health Maintenance Immunization History  Administered Date(s) Administered  . Influenza Split 01/30/2012  . Influenza,inj,Quad PF,36+ Mos 12/29/2013, 01/16/2016  . PPD Test 12/05/2011   There are no preventive care reminders to display for this patient.  Patient Care Team: Glori Luis, MD as PCP - General (Family Medicine)  Indicate any recent Medical Services you may have received from other than [facility] providers in the past year (date may be approximate).    Assessment:   This is a routine wellness examination for Matthew Juarez.  The goal of the wellness visit is to assist the patient how to close the gaps in care and create a preventative care plan for the patient.   Taking calcium VIT D as appropriate/Osteoporosis risk reviewed.  Medications reviewed;  taking without issues or barriers.  Safety issues reviewed; smoke detectors in the home. No firearms in the home. Wears seatbelts when driving or riding with others. Patient does wear sunscreen or protective clothing when in direct sunlight. No violence in the home.  Depression- PHQ 2 &9 complete.  No signs/symptoms or verbal communication regarding little pleasure in doing things, feeling down, depressed or hopeless. No changes in sleeping, energy, eating, concentrating.  No thoughts of self harm or harm towards others.    Patient is alert, normal appearance, oriented to person/place/and time. Correctly identified the president of the [place], recall of 2/3 words, and performing simple calculations.  Patient displays appropriate judgement and can read correct time from watch face.  No new identified risk were noted.  No failures at ADL's or IADL's.   BMI- discussed the importance of a healthy diet, water intake and exercise. Educational material provided.   24 hour diet recall: Breakfast: Sausage/biscuit  Lunch: Fried Printmaker: Hamburger helper  Daily fluid intake: 2 cups of caffeine,  5 cups of water  Dental- every 12 months.  Dr. Reeves Forth.  Sleep patterns- Sleeps 7 hours at night.  Wakes feeling rested.  Health maintenance gaps- closed.  Patient Concerns: None at this time. Follow up with PCP as needed.  Hearing/Vision screen Hearing Screening Comments: Patient is able to hear conversational tones without difficulty.  No issues reported.   Vision Screening Comments: Followed by Dr. Shirlyn Goltz Wears corrective lenses when reading Last OV 05/ 2018 Visual acuity not assessed per patient preference since they have regular follow up with the ophthalmologist  Dietary issues and exercise activities discussed: Current Exercise Habits: Home exercise routine, Type of exercise: stretching (Chair exercise), Time (Minutes): 10, Frequency (Times/Week): 7, Weekly Exercise  (Minutes/Week): 70, Intensity: Mild  Goals    . Increase physical activity          Walk as tolerated Water aerobics Physical therapy    . Reduce sugar           Low carb foods Lean meats      Depression Screen PHQ 2/9 Scores 08/21/2016 10/12/2015  PHQ - 2 Score 0 0  PHQ- 9 Score 0 -    Fall Risk Fall Risk  08/21/2016  Falls in the past year? No    Cognitive Function: MMSE - Mini Mental State Exam 08/21/2016  Orientation to time 5  Orientation to Place 5  Registration 3  Attention/ Calculation 5  Recall 3  Language- name 2 objects 2  Language- repeat 1  Language- follow 3 step command 3  Language- read & follow direction 1  Write a sentence 1  Copy design 1  Total score 30  Screening Tests Health Maintenance  Topic Date Due  . INFLUENZA VACCINE  10/29/2016  . COLONOSCOPY  01/30/2020  . TETANUS/TDAP  04/01/2021  . Hepatitis C Screening  Completed  . HIV Screening  Completed        Plan:   End of life planning; Advanced aging; Advanced directives discussed.  No HCPOA/Living Will.  Additional information provided to help them start the conversation with family.  Copy of HCPOA/Living Will requested upon completion. Time spent on this topic is 18 minutes.  I have personally reviewed and noted the following in the patient's chart:   . Medical and social history . Use of alcohol, tobacco or illicit drugs  . Current medications and supplements . Functional ability and status . Nutritional status . Physical activity . Advanced directives . List of other physicians . Hospitalizations, surgeries, and ER visits in previous 12 months . Vitals . Screenings to include cognitive, depression, and falls . Referrals and appointments  In addition, I have reviewed and discussed with patient certain preventive protocols, quality metrics, and best practice recommendations. A written personalized care plan for preventive services as well as general preventive  health recommendations were provided to patient.     Ashok Pall, LPN   07/07/8117

## 2016-08-21 NOTE — Patient Instructions (Addendum)
  Mr. Alto DenverHunt , Thank you for taking time to come for your Medicare Wellness Visit. I appreciate your ongoing commitment to your health goals. Please review the following plan we discussed and let me know if I can assist you in the future.   Follow up with Dr. Birdie SonsSonnenberg as needed.    Bring a copy of your Health Care Power of Attorney and/or Living Will to be scanned into chart once completed.  Bring Colonoscopy place/date to next visit.  Have a great day!  These are the goals we discussed: Goals    . Increase physical activity          Walk as tolerated Water aerobics Physical therapy    . Reduce sugar           Low carb foods Lean meats       This is a list of the screening recommended for you and due dates:  Health Maintenance  Topic Date Due  . Flu Shot  10/29/2016  . Colon Cancer Screening  01/30/2020  . Tetanus Vaccine  04/01/2021  .  Hepatitis C: One time screening is recommended by Center for Disease Control  (CDC) for  adults born from [id number] through 1967.   Completed  . HIV Screening  Completed

## 2016-09-22 DIAGNOSIS — H04123 Dry eye syndrome of bilateral lacrimal glands: Secondary | ICD-10-CM | POA: Diagnosis not present

## 2016-09-22 DIAGNOSIS — H25013 Cortical age-related cataract, bilateral: Secondary | ICD-10-CM | POA: Diagnosis not present

## 2016-09-22 DIAGNOSIS — H35033 Hypertensive retinopathy, bilateral: Secondary | ICD-10-CM | POA: Diagnosis not present

## 2016-09-22 DIAGNOSIS — H2513 Age-related nuclear cataract, bilateral: Secondary | ICD-10-CM | POA: Diagnosis not present

## 2016-09-30 ENCOUNTER — Ambulatory Visit: Payer: Medicare Other | Attending: Neurology | Admitting: Rehabilitative and Restorative Service Providers"

## 2016-09-30 DIAGNOSIS — R2689 Other abnormalities of gait and mobility: Secondary | ICD-10-CM | POA: Diagnosis not present

## 2016-09-30 DIAGNOSIS — M6281 Muscle weakness (generalized): Secondary | ICD-10-CM

## 2016-09-30 DIAGNOSIS — R29818 Other symptoms and signs involving the nervous system: Secondary | ICD-10-CM

## 2016-10-01 NOTE — Therapy (Signed)
[facility] [address] [place], [place], [address] Phone: [phone]   Fax:  [phone]  Physical Therapy Evaluation  Patient Details  Name: Matthew Juarez MRN: [id number] Date of Birth: 03-Jul-1960 Referring Provider: Olena Heckle, MD  Encounter Date: 09/30/2016      PT End of Session - 09/30/16 2229    Visit Number 1   Number of Visits 17   Date for PT Re-Evaluation 11/29/16   Authorization Type G code every 10th visit   PT Start Time 1235   PT Stop Time 1318   PT Time Calculation (min) 43 min   Equipment Utilized During Treatment Gait belt   Activity Tolerance Patient tolerated treatment well   Behavior During Therapy [facility] for tasks assessed/performed      Past Medical History:  Diagnosis Date  . Arthritis   . Chronic back pain   . Gout   . Hammer toe   . Headache(784.0)   . History of blood clots   . Hypertension   . Pulmonary embolism (HCC)   . Transverse myelitis [facility])     Past Surgical History:  Procedure Laterality Date  . HAMMER TOE SURGERY     2016    There were no vitals filed for this visit.       Subjective Assessment - 09/30/16 1240    Subjective The patient is known to our clinic from prior physical therapy for neuromyelitis optica.  He is currently functioning from w/c level for community mobility (power w/c), short distance ambulation in the home and manual w/c.    He notes continued weakness in legs.  He has R KAFO at home, but notes he does not wear this due to weight of brace.     Patient Stated Goals "I want to get stronger and use the forearm crutches again".  He notes being able to go to the grocery store or barber shop without w/c would be a long term goal.     Currently in Pain? No/denies  L shoulder has intermittent pain-- he uses oxycodone to treat intermittently            [facility] PT Assessment - 09/30/16 1244      Assessment   Medical Diagnosis  Neuromyelitis Opticans   Referring Provider Olena Heckle, MD   Onset Date/Surgical Date --  2014   Prior Therapy known to our clinic from prior PT     Precautions   Precautions Fall   Required Braces or Orthoses --  R KAFO -- does not use "too heavy"     Restrictions   Weight Bearing Restrictions No     Balance Screen   Has the patient fallen in the past 6 months No   Has the patient had a decrease in activity level because of a fear of falling?  Yes  due to neurological condition   Is the patient reluctant to leave their home because of a fear of falling?  No  uses power w/c     Home Environment   Living Environment Private residence   Living Arrangements Spouse/significant other   Type of Home House   Home Access Ramped entrance   Home Layout One level   Home Equipment Shower seat;Grab bars - toilet;Walker - 2 wheels;Wheelchair - Engineer, technical sales - power  car lift     Prior Function   Level of Independence Independent   Vocation On disability   Leisure movies, dinner, travel     Observation/Other  Assessments   Focus on Therapeutic Outcomes (FOTO)  53%   Other Surveys  Other Surveys   Activities of Balance Confidence Scale (ABC Scale)  71.9%     Sensation   Light Touch Appears Intact   Additional Comments wears reading glasses     Tone   Assessment Location Right Lower Extremity;Left Lower Extremity     ROM / Strength   AROM / PROM / Strength AROM;Strength     AROM   Overall AROM  Within functional limits for tasks performed     Strength   Overall Strength Deficits   Overall Strength Comments Bilat shoulder flexion 5/5, bilateral shoulder abductoin 5/5.    Strength Assessment Site Hip;Knee;Ankle   Right/Left Hip Right;Left   Right Hip Flexion 3/5   Right Hip Extension 2/5  unable to lift against gravity, estimated 2/5   Right Hip ABduction 2/5   Left Hip Flexion 4/5   Left Hip Extension 2/5   Left Hip ABduction 3/5   Right/Left Knee  Right;Left   Right Knee Flexion 2/5   Right Knee Extension 4/5   Left Knee Flexion 5/5   Left Knee Extension 5/5   Right/Left Ankle Right;Left   Right Ankle Dorsiflexion 4/5   Left Ankle Dorsiflexion 4/5     Flexibility   Soft Tissue Assessment /Muscle Length yes   Hamstrings PT assessed due to patient maintaining flexed knee position in standing.  R LE is -20 degrees from extension and L LE is -30 degrees from full extension (supine hip/knee 90/90 starting position).     Bed Mobility   Bed Mobility Supine to Sit;Sit to Supine   Supine to Sit 6: Modified independent (Device/Increase time)   Sit to Supine 6: Modified independent (Device/Increase time)     Transfers   Transfers Sit to BJ'sStand;Stand Pivot Transfers   Sit to Stand 5: Supervision   Stand Pivot Transfers 6: Modified independent (Device/Increase time)     Ambulation/Gait   Ambulation/Gait Yes   Ambulation/Gait Assistance 5: Supervision   Ambulation/Gait Assistance Details PT provided CGA due to patient's height/size, however patient walks with supervision in home and was safe today in clinic with ambulation with RW.   Ambulation Distance (Feet) 65 Feet   Assistive device Rolling walker   Gait Pattern Trunk flexed;Decreased trunk rotation;Decreased step length - right;Decreased step length - left  patient maintains flexed knee position bilaterally   Ambulation Surface Level;Indoor   Gait Comments Patient uses a standard walker in home, in clinic, PT encourages patient to perform reciprocal gait without lifting walker each step.  *Will need to call [facility] to check on getting wheels for walker.      Balance   Balance Assessed Yes     Static Standing Balance   Static Standing - Balance Support No upper extremity supported   Static Standing - Level of Assistance 5: Stand by assistance   Static Standing - Comment/# of Minutes Patient maintains unsupported standing prior to transfer.  He reaches for w/c and/or mat with UE for  support during dynamic standing activities.     RLE Tone   RLE Tone Modified Ashworth     RLE Tone   Modified Ashworth Scale for Grading Hypertonia RLE Slight increase in muscle tone, manifested by a catch and release or by minimal resistance at the end of the range of motion when the affected part(s) is moved in flexion or extension     LLE Tone   LLE Tone Modified Ashworth  LLE Tone   Modified Ashworth Scale for Grading Hypertonia LLE No increase in muscle tone            Objective measurements completed on examination: See above findings.                  PT Education - 09/30/16 2229    Education provided Yes   Education Details Discussed goals of PT   Person(s) Educated Patient   Methods Explanation   Comprehension Verbalized understanding          PT Short Term Goals - 10/01/16 1443      PT SHORT TERM GOAL #1   Title The patient will be indep with HEP for LE strengthening.  TARGET DATE FOR STGs:  10/30/2016   Time 4   Period Weeks     PT SHORT TERM GOAL #2   Title The patient will maintain standing x 5 minutes with intermittent UE support in order to perform ADLs from standing position for increased weight bearing.   Time 4   Period Weeks     PT SHORT TERM GOAL #3   Title The patient will ambulate x 150 ft nonstop with RW and supervision for increased household mobility.   Time 4   Period Weeks     PT SHORT TERM GOAL #4   Title The patient will ambulate with forearm crutches x 30 ft with min A.   Time 4   Period Weeks     PT SHORT TERM GOAL #5   Title Assess gait speed and write LTG.   Time 4   Period Weeks           PT Long Term Goals - 10/01/16 1446      PT LONG TERM GOAL #1   Title The patient will be indep with post d/c HEP.  TARGET DATE ALL LTGS:  11/29/2016   Time 8   Period Weeks     PT LONG TERM GOAL #2   Title The patient will improve functional status score from 53% to > or equal to 62% to demo improved self  perception of mobility.   Time 8   Period Weeks     PT LONG TERM GOAL #3   Title The patient will ambulate for short community distances with least restrictive device x 200 ft in order to walk into restaurants, MD appointments, barber shop, etc without using power chair.   Time 8   Period Weeks     PT LONG TERM GOAL #4   Title The patient will improve functional LE strength by demonstrating 10 x sit<>stand with one UE support.     Time 8   Period Weeks     PT LONG TERM GOAL #5   Title The patient will have LTG for gait speed once assessed.   Time 8   Period Weeks                Plan - 10/01/16 1452    Clinical Impression Statement The patient is a 56 year old male dx with neuromyelitis optica in 2014.  He presents with motor weakness worse on R LE in distribution of hip extensors, abductors, and knee flexors (L5-S1 myotomes), L LE weakness in hip extensor and abductors.  He ambulates short distances with standard walker in the home and uses w/c for all community mobility.  His main goal is to be able to access short distance community surfaces/appts using forearm crutches (previously used with PT  in 2015) and gain increased muscle strength.  PT to address deficits to optimize current functional status.   History and Personal Factors relevant to plan of care: Patient unable to work, uses w/c in community.   Clinical Presentation Evolving   Clinical Presentation due to: due to nature of disease progression   Clinical Decision Making Moderate   Rehab Potential Good   Clinical Impairments Affecting Rehab Potential nature of disease progression,  caution with intensity of ther ex as patient has had exacerbation of symptoms in past during episode of PT (this was early in dx and he appears more stable in clinical presentation at this time).   PT Frequency 2x / week  evaluation plus   PT Duration 8 weeks   PT Treatment/Interventions ADLs/Self Care Home Management;DME  Instruction;Therapeutic activities;Therapeutic exercise;Balance training;Neuromuscular re-education;Gait training;Stair training;Functional mobility training;Patient/family education;Manual techniques;Electrical Stimulation   PT Next Visit Plan Establish HEP for R knee flexion, bilateral hip abduction, bilateral hip extension, general functional strength (sit<>stand) and standing tolerance near counter dec'ing UE support.  Hamstring stretching, gait training with RW for distance and to work on reciprocal pattern of gait.  The patient wishes to use forearm crutches.  If not able, rollater RW may offer more flexibility + greater stability for short distance community ambulation.  Will consider as we progress and assess safety with gait activities.   Consulted and Agree with Plan of Care Patient      Patient will benefit from skilled therapeutic intervention in order to improve the following deficits and impairments:  Abnormal gait, Difficulty walking, Impaired tone, Decreased endurance, Decreased activity tolerance, Decreased balance, Decreased mobility, Decreased strength, Impaired flexibility, Decreased knowledge of use of DME, Increased muscle spasms  Visit Diagnosis: Muscle weakness (generalized)  Other abnormalities of gait and mobility  Other symptoms and signs involving the nervous system     Problem List Patient Active Problem List   Diagnosis Date Noted  . Obesity 06/30/2016  . Influenza 05/27/2016  . Right heart failure (HCC) 04/30/2016  . Coronary artery calcification 04/30/2016  . Palpitations 04/14/2016  . Hyperlipidemia 01/16/2016  . Prediabetes 09/13/2015  . Gout 08/10/2015  . History of hypertension 08/10/2015  . Candidal intertrigo 08/10/2015  . Chronic pain syndrome 07/11/2015  . History of pulmonary embolus (PE) 07/23/2012  . Hematuria 06/28/2012  . Cardiac arrest (HCC) 06/23/2012  . Transverse myelitis (HCC) 04/21/2012  . Closed right ankle fracture 04/19/2012   . Chronic back pain     Kitt Ledet, PT 10/01/2016, 3:13 PM   [facility] [address] [place], [place], [address] Phone: [phone]   Fax:  [phone]  Name: Jakoby Melendrez MRN: [id number] Date of Birth: 08-15-1960

## 2016-10-06 ENCOUNTER — Other Ambulatory Visit: Payer: Self-pay | Admitting: Family Medicine

## 2016-10-06 ENCOUNTER — Ambulatory Visit: Payer: Medicare Other | Admitting: Rehabilitative and Restorative Service Providers"

## 2016-10-06 DIAGNOSIS — M6281 Muscle weakness (generalized): Secondary | ICD-10-CM

## 2016-10-06 DIAGNOSIS — R2689 Other abnormalities of gait and mobility: Secondary | ICD-10-CM | POA: Diagnosis not present

## 2016-10-06 DIAGNOSIS — R29818 Other symptoms and signs involving the nervous system: Secondary | ICD-10-CM | POA: Diagnosis not present

## 2016-10-06 NOTE — Therapy (Signed)
[facility] [address] [place], [place], [address] Phone: [phone]   Fax:  [phone]  Physical Therapy Treatment  Patient Details  Name: Matthew Juarez MRN: [id number] Date of Birth: 02/18/1961 Referring Provider: Olena Heckle, MD  Encounter Date: 10/06/2016      PT End of Session - 10/06/16 1338    Visit Number 2   Number of Visits 17   Date for PT Re-Evaluation 11/29/16   Authorization Type G code every 10th visit   PT Start Time 1230   PT Stop Time 1330   PT Time Calculation (min) 60 min   Equipment Utilized During Treatment Gait belt   Activity Tolerance Patient tolerated treatment well   Behavior During Therapy [facility] for tasks assessed/performed      Past Medical History:  Diagnosis Date  . Arthritis   . Chronic back pain   . Gout   . Hammer toe   . Headache(784.0)   . History of blood clots   . Hypertension   . Pulmonary embolism (HCC)   . Transverse myelitis [facility])     Past Surgical History:  Procedure Laterality Date  . HAMMER TOE SURGERY     2016    There were no vitals filed for this visit.                       [facility] Adult PT Treatment/Exercise - 10/06/16 1341      Ambulation/Gait   Ambulation/Gait Yes   Ambulation/Gait Assistance 5: Supervision   Ambulation/Gait Assistance Details PT provides CGA due to patient's size, but he is close supervision as functional status.   Ambulation Distance (Feet) 115 Feet   Assistive device Rolling walker   Ambulation Surface Level;Indoor   Gait Comments Emphasis on R heel strike encouraging longer stride length.     Exercises   Exercises Knee/Hip;Ankle     Knee/Hip Exercises: Seated   Hamstring Curl Right;Strengthening;Other (comment)  yellow and red band- provided yellow for home   Hamstring Limitations Patient had improved quality of movement with lighter resistance.    Sit to Sand 10 reps;with UE support  with RW  in front for support, cues for upright posture     Knee/Hip Exercises: Sidelying   Hip ABduction Left;10 reps   Hip ABduction Limitations With cues to avoid flexing at hip, and also tactile cues due to movement pattern of LEs.  Used Ship broker for Financial trader.   Clams x 10 reps bilaterally with tactile cues for positioning and demonstration to perform.     Knee/Hip Exercises: Prone   Other Prone Exercises gluteal set prone x 5 second holds x 10 reps   Other Prone Exercises Attempted bilateral hip extension SLR, however patient not able to lift against gravity.  Attempted to modify by having patient brace with foot on mat to  lift knee off mat, however unable to follow instruction and perform movement.       Ankle Exercises: Stretches   Other Stretch Seated heel cord stretch with belt with patient needing cues to control ankle inversion.  Recommended caregiver passive stretch for home for ROM.      Ankle Exercises: Seated   Other Seated Ankle Exercises Seated eversion squeezing his fist at knees to prevent hip movement.  Patient needed cues asnd performed lower reps, then rested to avoid compensatory patterns.  As fatigue increases, muscle tone into inversion hinders further movement.  PT Education - 10/06/16 1338    Education provided Yes   Education Details HEP: provided LE strengthening -- see patient instruction   Person(s) Educated Patient   Methods Explanation;Demonstration;Handout   Comprehension Verbalized understanding;Returned demonstration          PT Short Term Goals - 10/01/16 1443      PT SHORT TERM GOAL #1   Title The patient will be indep with HEP for LE strengthening.  TARGET DATE FOR STGs:  10/30/2016   Time 4   Period Weeks     PT SHORT TERM GOAL #2   Title The patient will maintain standing x 5 minutes with intermittent UE support in order to perform ADLs from standing position for increased weight bearing.   Time 4   Period Weeks      PT SHORT TERM GOAL #3   Title The patient will ambulate x 150 ft nonstop with RW and supervision for increased household mobility.   Time 4   Period Weeks     PT SHORT TERM GOAL #4   Title The patient will ambulate with forearm crutches x 30 ft with min A.   Time 4   Period Weeks     PT SHORT TERM GOAL #5   Title Assess gait speed and write LTG.   Time 4   Period Weeks           PT Long Term Goals - 10/01/16 1446      PT LONG TERM GOAL #1   Title The patient will be indep with post d/c HEP.  TARGET DATE ALL LTGS:  11/29/2016   Time 8   Period Weeks     PT LONG TERM GOAL #2   Title The patient will improve functional status score from 53% to > or equal to 62% to demo improved self perception of mobility.   Time 8   Period Weeks     PT LONG TERM GOAL #3   Title The patient will ambulate for short community distances with least restrictive device x 200 ft in order to walk into restaurants, MD appointments, barber shop, etc without using power chair.   Time 8   Period Weeks     PT LONG TERM GOAL #4   Title The patient will improve functional LE strength by demonstrating 10 x sit<>stand with one UE support.     Time 8   Period Weeks     PT LONG TERM GOAL #5   Title The patient will have LTG for gait speed once assessed.   Time 8   Period Weeks               Plan - 10/06/16 1339    Clinical Impression Statement The patient tolerated home exercise activities well.  He needs assist for ankle stretching due to inversion/muscle tone, therefore PT recommended he perform with caregiver assistance.  The patient had improved gait today with wider RW and reciprocal pattern.    Clinical Impairments Affecting Rehab Potential nature of disease progression,  caution with intensity of ther ex as patient has had exacerbation of symptoms in past during episode of PT (this was early in dx and he appears more stable in clinical presentation at this time).   PT Treatment/Interventions  ADLs/Self Care Home Management;DME Instruction;Therapeutic activities;Therapeutic exercise;Balance training;Neuromuscular re-education;Gait training;Stair training;Functional mobility training;Patient/family education;Manual techniques;Electrical Stimulation   PT Next Visit Plan Inquire about HEP and address any questions, check hip flexor tightness/stretch hip flexors, LE strengthening, gait  with RW with wide/tall walker emphasizing increased knee extension for heel strike.   Consulted and Agree with Plan of Care Patient      Patient will benefit from skilled therapeutic intervention in order to improve the following deficits and impairments:  Abnormal gait, Difficulty walking, Impaired tone, Decreased endurance, Decreased activity tolerance, Decreased balance, Decreased mobility, Decreased strength, Impaired flexibility, Decreased knowledge of use of DME, Increased muscle spasms  Visit Diagnosis: Muscle weakness (generalized)  Other abnormalities of gait and mobility  Other symptoms and signs involving the nervous system     Problem List Patient Active Problem List   Diagnosis Date Noted  . Obesity 06/30/2016  . Influenza 05/27/2016  . Right heart failure (HCC) 04/30/2016  . Coronary artery calcification 04/30/2016  . Palpitations 04/14/2016  . Hyperlipidemia 01/16/2016  . Prediabetes 09/13/2015  . Gout 08/10/2015  . History of hypertension 08/10/2015  . Candidal intertrigo 08/10/2015  . Chronic pain syndrome 07/11/2015  . History of pulmonary embolus (PE) 07/23/2012  . Hematuria 06/28/2012  . Cardiac arrest (HCC) 06/23/2012  . Transverse myelitis (HCC) 04/21/2012  . Closed right ankle fracture 04/19/2012  . Chronic back pain     Dede Dobesh, PT 10/06/2016, 1:49 PM  [facility] [address] [place], [place], [address] Phone: [phone]   Fax:  [phone]  Name: Matthew Juarez MRN: [id number] Date of  Birth: 1960/10/30

## 2016-10-06 NOTE — Patient Instructions (Signed)
   CAREGIVER ASSISTED: Ankle Dorsiflexion    RIGHT LEG:  Caregiver cups hand under heel, rests foot on forearm; leans forward to move foot toward head. Hold _30__ seconds. __3_ reps per set, __1-2_ sets per day.   Copyright  VHI. All rights reserved.     Clam Shell 45 Degrees    Lying with hips and knees bent 45, one pillow between knees and ankles. Lift knee. Be sure pelvis does not roll backward. Do not arch back. Do _5__ times, rest and repeat. _1__ times per day.  http://ss.exer.us/74   Copyright  VHI. All rights reserved.   Gluteal Sets    Tighten buttocks while pressing pelvis to floor. Hold __5__ seconds. Repeat _10___ times per set.  Do __1-2__ sessions per day.  http://orth.exer.us/104   Copyright  VHI. All rights reserved.   Hamstring Curl: Resisted (Sitting)    Facing anchor with tubing on right ankle, leg straight out, bend knee. Repeat __5__ times, rest and then repeat. Do _1-2___ sessions per day.   http://orth.exer.us/668   Copyright  VHI. All rights reserved.  ANKLE: Eversion, Unilateral    SQUEEZE YOUR FIST BETWEEN YOUR KNEES (to keep hip from doing the work).  Sit at edge of surface, feet on floor. Raise toes of foot up and move away from body. Do not move hip or knee. _5__ reps, rest and repeat. 1-2 times/day.  Copyright  VHI. All rights reserved.   Functional Quadriceps: Sit to Stand    Sit on edge of chair, feet flat on floor. Stand upright, extending knees fully. HAVE YOUR WALKER IN FRONT OF YOU.  1) Think about squeezing bottom muscles to get to upright position. 2) You can use your hands as needed.  Repeat __10__ times per set. Do __1__ sets per session. Do __1-2__ sessions per day.  http://orth.exer.us/734   Copyright  VHI. All rights reserved.

## 2016-10-09 ENCOUNTER — Ambulatory Visit: Payer: Medicare Other | Admitting: Physical Therapy

## 2016-10-09 ENCOUNTER — Encounter: Payer: Self-pay | Admitting: Physical Therapy

## 2016-10-09 DIAGNOSIS — M6281 Muscle weakness (generalized): Secondary | ICD-10-CM

## 2016-10-09 DIAGNOSIS — R2689 Other abnormalities of gait and mobility: Secondary | ICD-10-CM | POA: Diagnosis not present

## 2016-10-09 DIAGNOSIS — R29818 Other symptoms and signs involving the nervous system: Secondary | ICD-10-CM | POA: Diagnosis not present

## 2016-10-09 NOTE — Patient Instructions (Addendum)
  Hip Flexor Stretch    Lying on back near edge of bed, keep leg on bed flat or bend at knee. Hang other leg over edge, relaxed, thigh resting entirely on bed for _30 sec - . Repeat _2___ times. Do _2-3___ sessions per day.   http://[email or web address].exer.us/346   Copyright  VHI. All rights reserved.

## 2016-10-09 NOTE — Therapy (Signed)
[facility] [address] [place], [place], [address] Phone: [phone]   Fax:  [phone]  Physical Therapy Treatment  Patient Details  Name: Matthew MessingJeffrey Juarez MRN: [id number] Date of Birth: 04/15/1960 Referring Provider: Olena HeckleFletcher Lee Hartsell, MD  Encounter Date: 10/09/2016      PT End of Session - 10/09/16 1024    Visit Number 3   Number of Visits 17   Date for PT Re-Evaluation 11/29/16   Authorization Type G code every 10th visit   PT Start Time 0932   PT Stop Time 1017   PT Time Calculation (min) 45 min   Equipment Utilized During Treatment Gait belt   Activity Tolerance Patient tolerated treatment well   Behavior [facility] Therapy [facility] for tasks assessed/performed      Past Medical History:  Diagnosis Date  . Arthritis   . Chronic back pain   . Gout   . Hammer toe   . Headache(784.0)   . History of blood clots   . Hypertension   . Pulmonary embolism (HCC)   . Transverse myelitis [facility](HCC)     Past Surgical History:  Procedure Laterality Date  . HAMMER TOE SURGERY     2016    There were no vitals filed for this visit.      Subjective Assessment - 10/09/16 0936    Subjective Patient reports that he has being doing his HEP with the help of his wife with no problems.    Patient Stated Goals "I want to get stronger and use the forearm crutches again".  He notes being able to go to the grocery store or barber shop without w/c would be a long term goal.     Currently in Pain? No/denies               [facility]/Exercise - 10/09/16 1033      Ambulation/Gait   Ambulation/Gait Yes   Ambulation/Gait Assistance 5: Supervision;4: Min guard   Ambulation Distance (Feet) 115 Feet  2710ft to WC at end of session   Assistive device Rolling walker   Gait Pattern Trunk flexed;Decreased trunk rotation;Decreased step length - right;Decreased step length - left   Ambulation Surface Level;Indoor   Gait  Comments Emphasis on R heel strike encouraging longer stride length, extending knees when in stance phase to support weight, and posture      Exercises   Exercises Knee/Hip     Knee/Hip Exercises: Stretches   Hip Flexor Stretch Both;3 reps;20 seconds     Knee/Hip Exercises: Standing   Hip Abduction AROM;Both;1 set;5 reps;Knee straight   Abduction Limitations fatigued from other exercises, trunk leaning to lift legs   Hip Extension AROM;Both;1 set;10 reps;Knee straight   Other Standing Knee Exercises standing hip flexion/forward kicks 10x alternating legs, min guard with cues to extend knee while weight bearing; toe raises while standing 10x   Other Standing Knee Exercises standing exercises were min guard with heavy UE support on RW     Knee/Hip Exercises: Seated   Long Arc Quad Strengthening;Both;1 set;10 reps;Weights   Long Arc Quad Weight 3 lbs.   Long [employer] Quad Limitations cues to keep trunk straight and to do exercise slow and controlled, right leg struggled to bend back due to hamstring weakness    Marching Strengthening;Both;1 set;10 reps   Marching Limitations compensation lean to left when lifting right leg, cues to keep trunk straight    Marching Weights 3 lbs.  weight on left leg, no  weight on right leg    Sit to Sand 1 set;5 reps;with UE support;without UE support;Other (comment)  min guard/assist as he fatigued, cues for knee ext      Knee/Hip Exercises: Supine   Bridges Both;1 set;10 reps   Bridges Limitations cues to squeeze glutes when at top of bridge      Knee/Hip Exercises: Prone   Hip Extension AAROM;Right;Left;1 set;5 reps  hold for 5 sec   Hip Extension Limitations toes on mat attempt to lift thigh and straighten knee, min help to lift knee with verbal cueing for proper form and to keep hips flat on mat instead of compensating by rollling to opposite hip to lift leg, tapping cues to hamstring to help with muscle re-ed                     Other Prone Exercises  gluteal set prone x 5 second holds x 10 reps                PT Education - 10/09/16 1011    Education provided Yes   Education Details added hip flexor stretch to HEP    Person(s) Educated Patient   Methods Explanation;Demonstration   Comprehension Returned demonstration          PT Short Term Goals - 10/01/16 1443      PT SHORT TERM GOAL #1   Title The patient will be indep with HEP for LE strengthening.  TARGET DATE FOR STGs:  10/30/2016   Time 4   Period Weeks     PT SHORT TERM GOAL #2   Title The patient will maintain standing x 5 minutes with intermittent UE support in order to perform ADLs from standing position for increased weight bearing.   Time 4   Period Weeks     PT SHORT TERM GOAL #3   Title The patient will ambulate x 150 ft nonstop with RW and supervision for increased household mobility.   Time 4   Period Weeks     PT SHORT TERM GOAL #4   Title The patient will ambulate with forearm crutches x 30 ft with min A.   Time 4   Period Weeks     PT SHORT TERM GOAL #5   Title Assess gait speed and write LTG.   Time 4   Period Weeks           PT Long Term Goals - 10/01/16 1446      PT LONG TERM GOAL #1   Title The patient will be indep with post d/c HEP.  TARGET DATE ALL LTGS:  11/29/2016   Time 8   Period Weeks     PT LONG TERM GOAL #2   Title The patient will improve functional status score from 53% to > or equal to 62% to demo improved self perception of mobility.   Time 8   Period Weeks     PT LONG TERM GOAL #3   Title The patient will ambulate for short community distances with least restrictive device x 200 ft in order to walk into restaurants, MD appointments, barber shop, etc without using power chair.   Time 8   Period Weeks     PT LONG TERM GOAL #4   Title The patient will improve functional LE strength by demonstrating 10 x sit<>stand with one UE support.     Time 8   Period Weeks     PT LONG TERM GOAL #5  Title The patient  will have LTG for gait speed once assessed.   Time 8   Period Weeks               Plan - 10/09/16 1025    Clinical Impression Statement Today's session focused on continued LE strengtening and ambulation with the rolling walker. Patient was able to walk around the gym with no problems only needing cues for posture and knee extension while weight bearing. He had noted right leg fatigue with slight knee buckling when attempting standing exercises. He would benefit from continued PT to strengthen and improve function of LE.     Clinical Impairments Affecting Rehab Potential nature of disease progression,  caution with intensity of ther ex as patient has had exacerbation of symptoms in past during episode of PT (this was early in dx and he appears more stable in clinical presentation at this time).   PT Frequency 2x / week   PT Duration 8 weeks   PT Next Visit Plan  LE strengthening, gait with RW with wide/tall walker emphasizing increased knee extension for heel strike.   Consulted and Agree with Plan of Care Patient      Patient will benefit from skilled therapeutic intervention in order to improve the following deficits and impairments:  Abnormal gait, Difficulty walking, Impaired tone, Decreased endurance, Decreased activity tolerance, Decreased balance, Decreased mobility, Decreased strength, Impaired flexibility, Decreased knowledge of use of DME, Increased muscle spasms  Visit Diagnosis: Muscle weakness (generalized)  Other abnormalities of gait and mobility  Other symptoms and signs involving the nervous system     Problem List Patient Active Problem List   Diagnosis Date Noted  . Obesity 06/30/2016  . Influenza 05/27/2016  . Right heart failure (HCC) 04/30/2016  . Coronary artery calcification 04/30/2016  . Palpitations 04/14/2016  . Hyperlipidemia 01/16/2016  . Prediabetes 09/13/2015  . Gout 08/10/2015  . History of hypertension 08/10/2015  . Candidal intertrigo  08/10/2015  . Chronic pain syndrome 07/11/2015  . History of pulmonary embolus (PE) 07/23/2012  . Hematuria 06/28/2012  . Cardiac arrest (HCC) 06/23/2012  . Transverse myelitis (HCC) 04/21/2012  . Closed right ankle fracture 04/19/2012  . Chronic back pain     Oleta Mouse 10/09/2016, 10:56 AM  [facility] [address] [place], [place], [address] Phone: [phone]   Fax:  [phone]  Name: Matthew Juarez MRN: [id number] Date of Birth: [birth year]

## 2016-10-13 ENCOUNTER — Ambulatory Visit: Payer: Medicare Other | Admitting: Family Medicine

## 2016-10-15 ENCOUNTER — Telehealth: Payer: Self-pay | Admitting: Rehabilitative and Restorative Service Providers"

## 2016-10-15 NOTE — Telephone Encounter (Signed)
PT called [facility] to determine if he was eligible to receive wheels for his walker.  Operator recommended patient call retail store, and find out if the wheels there would work with his current walker.    [facility] does not have record of prior walker order.  Latarra Eagleton, PT

## 2016-10-17 ENCOUNTER — Ambulatory Visit: Payer: Medicare Other | Admitting: Physical Therapy

## 2016-10-17 ENCOUNTER — Encounter: Payer: Self-pay | Admitting: Physical Therapy

## 2016-10-17 DIAGNOSIS — M6281 Muscle weakness (generalized): Secondary | ICD-10-CM | POA: Diagnosis not present

## 2016-10-17 DIAGNOSIS — R2689 Other abnormalities of gait and mobility: Secondary | ICD-10-CM

## 2016-10-17 DIAGNOSIS — R29818 Other symptoms and signs involving the nervous system: Secondary | ICD-10-CM

## 2016-10-17 NOTE — Therapy (Signed)
[facility] [address] [place], [place], [address] Phone: [phone]   Fax:  [phone]  Physical Therapy Treatment  Patient Details  Name: Ara Mano MRN: [id number] Date of Birth: 26-Apr-1960 Referring Provider: Olena Heckle, MD  Encounter Date: 10/17/2016      PT End of Session - 10/17/16 1202    Visit Number 4   Number of Visits 17   Date for PT Re-Evaluation 11/29/16   Authorization Type G code every 10th visit   PT Start Time 1102   PT Stop Time 1143   PT Time Calculation (min) 41 min   Equipment Utilized During Treatment Gait belt   Activity Tolerance Patient tolerated treatment well   Behavior During Therapy [facility] for tasks assessed/performed      Past Medical History:  Diagnosis Date  . Arthritis   . Chronic back pain   . Gout   . Hammer toe   . Headache(784.0)   . History of blood clots   . Hypertension   . Pulmonary embolism (HCC)   . Transverse myelitis [facility])     Past Surgical History:  Procedure Laterality Date  . HAMMER TOE SURGERY     2016    There were no vitals filed for this visit.      Subjective Assessment - 10/17/16 1103    Subjective Patient went to the beach this week so he hasn't done his exercises. He was able to walk around his hotel room a bit and completed a pivot transfer from his WC to a chair while out at a resturant. He reports no falls or pain today. Patient was notified to check in with [facility] about wheels for his RW.    Currently in Pain? No/denies               [facility] Adult PT Treatment/Exercise - 10/17/16 1152      Ambulation/Gait   Ambulation/Gait Yes   Ambulation/Gait Assistance 5: Supervision;4: Min guard   Ambulation Distance (Feet) 115 Feet  43ft mat to parallel bars    Assistive device Rolling walker   Gait Pattern Trunk flexed;Decreased trunk rotation;Decreased step length - right;Decreased step length - left   Ambulation Surface Level;Indoor   Gait Comments Emphasis on R heel strike encouraging longer stride length, extending knees when in stance phase to support weight, and posture      Exercises   Exercises Knee/Hip     Knee/Hip Exercises: Stretches   Hip Flexor Stretch Right;Left;2 reps;60 seconds  thomas stretch over side of mat table     Knee/Hip Exercises: Standing   Hip Abduction AROM;Right;Stengthening;Left;1 set;5 reps;Knee straight   Abduction Limitations 5x each side UE support on parallel bars, uncontrolled movement cued to slow down    Hip Extension AROM;Stengthening;Right;Left;1 set;10 reps   Other Standing Knee Exercises standing hip flexion/forward kicks 10x alternating legs, min guard with cues to extend knee while weight bearing   Other Standing Knee Exercises standing exercises were min guard with heavy UE support on parallel bars     Knee/Hip Exercises: Seated   Sit to Sand 1 set;10 reps;with UE support;without UE support  used RW for UE support intermittently, cues for posture      Knee/Hip Exercises: Supine   Heel Slides AROM;AAROM;Right;Strengthening;Left;1 set;10 reps   Heel Slides Limitations used pillow case to help movement, right leg required intermittent min assistance to complete ROM   Bridges Both;1 set;10 reps   Bridges Limitations cues to squeeze  glutes when at top of bridge    Other Supine Knee/Hip Exercises hamstring sets, pushing heel into mat holding for 3 sec then relaxing, 10x each leg     Knee/Hip Exercises: Sidelying   Other Sidelying Knee/Hip Exercises knee flexion using powder board for assistance, left leg used red theraband 10x, verbal and tactile cues to keep hip flexion minimized while completing exercise; right leg unable to complete ROM with theraband used 3# cuff weight, 10x with intermittent assistance to complete ROM, tactile cues to keep hip movement minimized             PT Short Term Goals - 10/01/16 1443      PT SHORT TERM GOAL  #1   Title The patient will be indep with HEP for LE strengthening.  TARGET DATE FOR STGs:  10/30/2016   Time 4   Period Weeks     PT SHORT TERM GOAL #2   Title The patient will maintain standing x 5 minutes with intermittent UE support in order to perform ADLs from standing position for increased weight bearing.   Time 4   Period Weeks     PT SHORT TERM GOAL #3   Title The patient will ambulate x 150 ft nonstop with RW and supervision for increased household mobility.   Time 4   Period Weeks     PT SHORT TERM GOAL #4   Title The patient will ambulate with forearm crutches x 30 ft with min A.   Time 4   Period Weeks     PT SHORT TERM GOAL #5   Title Assess gait speed and write LTG.   Time 4   Period Weeks           PT Long Term Goals - 10/01/16 1446      PT LONG TERM GOAL #1   Title The patient will be indep with post d/c HEP.  TARGET DATE ALL LTGS:  11/29/2016   Time 8   Period Weeks     PT LONG TERM GOAL #2   Title The patient will improve functional status score from 53% to > or equal to 62% to demo improved self perception of mobility.   Time 8   Period Weeks     PT LONG TERM GOAL #3   Title The patient will ambulate for short community distances with least restrictive device x 200 ft in order to walk into restaurants, MD appointments, barber shop, etc without using power chair.   Time 8   Period Weeks     PT LONG TERM GOAL #4   Title The patient will improve functional LE strength by demonstrating 10 x sit<>stand with one UE support.     Time 8   Period Weeks     PT LONG TERM GOAL #5   Title The patient will have LTG for gait speed once assessed.   Time 8   Period Weeks               Plan - 10/17/16 1203    Clinical Impression Statement Today's session focused on ambulation with RW and LE strengthening with an emphasis on bil hamstring strength. Patient continues to progress with gait and exercises becoming less fatigued today. He is able to  complete right knee flexion without assistance in a gravity minimized position. Patient would benefit from continued PT to maintain progress with exercises and work torward unmet goals.    Rehab Potential Good   Clinical Impairments Affecting Rehab  Potential nature of disease progression,  caution with intensity of ther ex as patient has had exacerbation of symptoms in past during episode of PT (this was early in dx and he appears more stable in clinical presentation at this time).   PT Frequency 2x / week   PT Duration 8 weeks   PT Treatment/Interventions ADLs/Self Care Home Management;DME Instruction;Therapeutic activities;Therapeutic exercise;Balance training;Neuromuscular re-education;Gait training;Stair training;Functional mobility training;Patient/family education;Manual techniques;Electrical Stimulation   PT Next Visit Plan  LE strengthening, gait with RW with wide/tall walker emphasizing increased knee extension for heel strike.   Consulted and Agree with Plan of Care Patient      Patient will benefit from skilled therapeutic intervention in order to improve the following deficits and impairments:  Abnormal gait, Difficulty walking, Impaired tone, Decreased endurance, Decreased activity tolerance, Decreased balance, Decreased mobility, Decreased strength, Impaired flexibility, Decreased knowledge of use of DME, Increased muscle spasms  Visit Diagnosis: Muscle weakness (generalized)  Other abnormalities of gait and mobility  Other symptoms and signs involving the nervous system     Problem List Patient Active Problem List   Diagnosis Date Noted  . Obesity 06/30/2016  . Influenza 05/27/2016  . Right heart failure (HCC) 04/30/2016  . Coronary artery calcification 04/30/2016  . Palpitations 04/14/2016  . Hyperlipidemia 01/16/2016  . Prediabetes 09/13/2015  . Gout 08/10/2015  . History of hypertension 08/10/2015  . Candidal intertrigo 08/10/2015  . Chronic pain syndrome  07/11/2015  . History of pulmonary embolus (PE) 07/23/2012  . Hematuria 06/28/2012  . Cardiac arrest (HCC) 06/23/2012  . Transverse myelitis (HCC) 04/21/2012  . Closed right ankle fracture 04/19/2012  . Chronic back pain     Lynett GrimesSarah Scotlyn Mccranie, SPTA 10/17/2016, 12:13 PM  [facility] [address] [place], [place], [address] Phone: [phone]   Fax:  [phone]  Name: Loma MessingJeffrey Seiter MRN: [id number] Date of Birth: 04/05/1960

## 2016-10-20 ENCOUNTER — Telehealth: Payer: Self-pay | Admitting: Rehabilitative and Restorative Service Providers"

## 2016-10-20 ENCOUNTER — Ambulatory Visit: Payer: Medicare Other | Admitting: Rehabilitative and Restorative Service Providers"

## 2016-10-20 DIAGNOSIS — R29818 Other symptoms and signs involving the nervous system: Secondary | ICD-10-CM | POA: Diagnosis not present

## 2016-10-20 DIAGNOSIS — M6281 Muscle weakness (generalized): Secondary | ICD-10-CM | POA: Diagnosis not present

## 2016-10-20 DIAGNOSIS — G373 Acute transverse myelitis in demyelinating disease of central nervous system: Secondary | ICD-10-CM

## 2016-10-20 DIAGNOSIS — L218 Other seborrheic dermatitis: Secondary | ICD-10-CM | POA: Diagnosis not present

## 2016-10-20 DIAGNOSIS — R2689 Other abnormalities of gait and mobility: Secondary | ICD-10-CM

## 2016-10-20 NOTE — Therapy (Signed)
[facility] [address] [place], [place], [address] Phone: [phone]   Fax:  [phone]  Physical Therapy Treatment  Patient Details  Name: Matthew Juarez MRN: [id number] Date of Birth: 1960/06/06 Referring Provider: Reinaldo Berber, MD  Encounter Date: 10/20/2016      PT End of Session - 10/20/16 1429    Visit Number 5   Number of Visits 17   Date for PT Re-Evaluation 11/29/16   Authorization Type G code every 10th visit   PT Start Time 0935   PT Stop Time 1020   PT Time Calculation (min) 45 min   Equipment Utilized During Treatment Gait belt   Activity Tolerance Patient tolerated treatment well   Behavior During Therapy [facility] for tasks assessed/performed      Past Medical History:  Diagnosis Date  . Arthritis   . Chronic back pain   . Gout   . Hammer toe   . Headache(784.0)   . History of blood clots   . Hypertension   . Pulmonary embolism ([place])   . Transverse myelitis [facility])     Past Surgical History:  Procedure Laterality Date  . HAMMER TOE SURGERY     2016    There were no vitals filed for this visit.      Subjective Assessment - 10/20/16 1423    Subjective The patient reports he thinks he obtained RW from [employer] instead of [facility] in 2014.  He sits in w/c for ADLs.   Patient Stated Goals "I want to get stronger and use the forearm crutches again".  He notes being able to go to the grocery store or barber shop without w/c would be a long term goal.     Currently in Pain? No/denies                         [facility] Adult PT Treatment/Exercise - 10/20/16 1002      Ambulation/Gait   Ambulation/Gait Yes   Ambulation/Gait Assistance 4: Min assist   Ambulation/Gait Assistance Details Tried bilateral forearm crutches today with +2 assist today for safety.  Patient does well with CGA of 2 people.  Worked on 4 point gait pattern, attempted 2 point gait pattern, however hard to  sequence.     Ambulation Distance (Feet) 40 Feet  x 2 with forearm crutches.   Assistive device R Forearm Crutch;L Forearm Crutch   Ambulation Surface Level;Indoor   Stairs Yes   Stairs Assistance 4: Min guard   Stair Management Technique Step to pattern;Two rails   Number of Stairs 4   Gait Comments Then ambulated 230 feet nonstop with RW with supervision.     Neuro Re-ed    Neuro Re-ed Details  Standing balance decreasing UE support x 5 minutes.  PT and patient discussed ability to use UE support for home for standing ADLs.  Emphasized ability to perform brushing teeth, shaving, etc from standing position.    Kicking bean bags with alteranting LEs and then with L LE with UE support in parallel bars.  Standing right weight shift moving L LE into/out of stride length.     Exercises   Exercises Other Exercises   Other Exercises  Standing gluteal sets and quad sets to help with LE muscle enagement during standing tasks.                  PT Short Term Goals - 10/20/16 1010  PT SHORT TERM GOAL #1   Title The patient will be indep with HEP for LE strengthening.  TARGET DATE FOR STGs:  10/30/2016   Time 4   Period Weeks   Status On-going     PT SHORT TERM GOAL #2   Title The patient will maintain standing x 5 minutes with intermittent UE support in order to perform ADLs from standing position for increased weight bearing.   Baseline Met on 10/20/16   Time 4   Period Weeks   Status Achieved     PT SHORT TERM GOAL #3   Title The patient will ambulate x 150 ft nonstop with RW and supervision for increased household mobility.   Baseline Met on 10/20/16 walking 230 ft nonstop.   Time 4   Period Weeks   Status Achieved     PT SHORT TERM GOAL #4   Title The patient will ambulate with forearm crutches x 30 ft with min A.   Time 4   Period Weeks   Status On-going     PT SHORT TERM GOAL #5   Title Assess gait speed and write LTG.   Time 4   Period Weeks   Status On-going            PT Long Term Goals - 10/01/16 1446      PT LONG TERM GOAL #1   Title The patient will be indep with post d/c HEP.  TARGET DATE ALL LTGS:  11/29/2016   Time 8   Period Weeks     PT LONG TERM GOAL #2   Title The patient will improve functional status score from 53% to > or equal to 62% to demo improved self perception of mobility.   Time 8   Period Weeks     PT LONG TERM GOAL #3   Title The patient will ambulate for short community distances with least restrictive device x 200 ft in order to walk into restaurants, MD appointments, barber shop, etc without using power chair.   Time 8   Period Weeks     PT LONG TERM GOAL #4   Title The patient will improve functional LE strength by demonstrating 10 x sit<>stand with one UE support.     Time 8   Period Weeks     PT LONG TERM GOAL #5   Title The patient will have LTG for gait speed once assessed.   Time 8   Period Weeks               Plan - 10/20/16 1431    Clinical Impression Statement The patient met 2 STGs.  He demonstrateda 4 point gait pattern with forearm crutches today with CGA of 2 people for safety.  Anticipate he will be able to safely negotiate household distances with crutches with practice in therapy.  PT to progress distance with RW and progress for shorter distances using forearm crutches.     Clinical Impairments Affecting Rehab Potential nature of disease progression,  caution with intensity of ther ex as patient has had exacerbation of symptoms in past during episode of PT (this was early in dx and he appears more stable in clinical presentation at this time).   PT Treatment/Interventions ADLs/Self Care Home Management;DME Instruction;Therapeutic activities;Therapeutic exercise;Balance training;Neuromuscular re-education;Gait training;Stair training;Functional mobility training;Patient/family education;Manual techniques;Electrical Stimulation   PT Next Visit Plan Bilateral forearm crutches, gait  extending distances with wide/tall RW emphasizing knee extension for heel strike, LE strengthening.   Consulted and Agree  with Plan of Care Patient      Patient will benefit from skilled therapeutic intervention in order to improve the following deficits and impairments:  Abnormal gait, Difficulty walking, Impaired tone, Decreased endurance, Decreased activity tolerance, Decreased balance, Decreased mobility, Decreased strength, Impaired flexibility, Decreased knowledge of use of DME, Increased muscle spasms  Visit Diagnosis: Muscle weakness (generalized)  Other abnormalities of gait and mobility  Other symptoms and signs involving the nervous system     Problem List Patient Active Problem List   Diagnosis Date Noted  . Obesity 06/30/2016  . Influenza 05/27/2016  . Right heart failure ([place]) 04/30/2016  . Coronary artery calcification 04/30/2016  . Palpitations 04/14/2016  . Hyperlipidemia 01/16/2016  . Prediabetes 09/13/2015  . Gout 08/10/2015  . History of hypertension 08/10/2015  . [place] intertrigo 08/10/2015  . Chronic pain syndrome 07/11/2015  . History of pulmonary embolus (PE) 07/23/2012  . Hematuria 06/28/2012  . Cardiac arrest ([place]) 06/23/2012  . Transverse myelitis ([place]) 04/21/2012  . Closed right ankle fracture 04/19/2012  . Chronic back pain     Lania Zawistowski, PT 10/20/2016, 2:35 PM  [facility] [address] [place], [place], [address] Phone: [phone]   Fax:  [phone]  Name: Brandon Wiechman MRN: [id number] Date of Birth: May 05, 1960

## 2016-10-20 NOTE — Telephone Encounter (Signed)
Dr. Birdie SonsSonnenberg, Loma MessingJeffrey Koenig is being treated by physical therapy for h/o transverse myelitis.  He will benefit from use of rolling walker in order to improve safety with functional mobility (he is currently ambulating with a standard walker and has improved gait efficiency with wheels).   Please submit in EPIC under referral for DME (list his medical diagnosis of transverse myelitis and RW in comments)  or fax to [facility] at [phone].   Thank you, Navid Lenzen, PT    [facility] [address] [place], [place]  [address] Phone:  [phone] Fax:  [phone]

## 2016-10-22 ENCOUNTER — Encounter: Payer: Self-pay | Admitting: Rehabilitative and Restorative Service Providers"

## 2016-10-22 ENCOUNTER — Ambulatory Visit: Payer: Medicare Other | Admitting: Rehabilitative and Restorative Service Providers"

## 2016-10-22 DIAGNOSIS — R29818 Other symptoms and signs involving the nervous system: Secondary | ICD-10-CM

## 2016-10-22 DIAGNOSIS — R2689 Other abnormalities of gait and mobility: Secondary | ICD-10-CM

## 2016-10-22 DIAGNOSIS — M6281 Muscle weakness (generalized): Secondary | ICD-10-CM | POA: Diagnosis not present

## 2016-10-22 NOTE — Therapy (Signed)
[facility] [address] [place], [place], [address] Phone: [phone]   Fax:  [phone]  Physical Therapy Treatment  Patient Details  Name: Matthew Juarez MRN: [id number] Date of Birth: 1960-10-27 Referring Provider: Reinaldo Berber, MD  Encounter Date: 10/22/2016      PT End of Session - 10/22/16 1036    Visit Number 6   Number of Visits 17   Date for PT Re-Evaluation 11/29/16   Authorization Type G code every 10th visit   PT Start Time 0931   PT Stop Time 1015   PT Time Calculation (min) 44 min   Equipment Utilized During Treatment Gait belt   Activity Tolerance Patient tolerated treatment well   Behavior During Therapy [facility] for tasks assessed/performed      Past Medical History:  Diagnosis Date  . Arthritis   . Chronic back pain   . Gout   . Hammer toe   . Headache(784.0)   . History of blood clots   . Hypertension   . Pulmonary embolism ([place])   . Transverse myelitis [facility])     Past Surgical History:  Procedure Laterality Date  . HAMMER TOE SURGERY     2016    There were no vitals filed for this visit.      Subjective Assessment - 10/22/16 0934    Subjective Patient denies any pain or falls upon reporting to PT. He has been attempting standing ADLs at home including brushng his teeth and reaching for things in a cabinet for his wife. His wife recored him brushing his teeth and he stated that he sees how bent his knees are when he is standing. He was able to walk up and down 4 stairs at his sister in [place].   Patient Stated Goals "I want to get stronger and use the forearm crutches again".  He notes being able to go to the grocery store or barber shop without w/c would be a long term goal.     Currently in Pain? No/denies                         [facility] Adult PT Treatment/Exercise - 10/22/16 0958      Ambulation/Gait   Ambulation/Gait Yes   Ambulation/Gait Assistance  4: Min assist;4: Min guard   Ambulation/Gait Assistance Details +2 assist due to patient's size, otherwise patient was min guard with bilaeral forearm crutches    Ambulation Distance (Feet) 120 Feet   Assistive device R Forearm Crutch;L Forearm Crutch   Ambulation Surface Level;Indoor   Gait Comments 4pt gait with forearm crutches, attempted 2pt gait with forearm crutches but unable to successfully complete correct sequence     Neuro Re-ed    Neuro Re-ed Details  in parallel bars tandem stance weight shifting, both right and left leg forward attempting to shift onto front leg then on to back leg, min guard with max verbal cues to shift with hips and not use knees or trunk, unable to fully shift weight onto one leg without help from bent knees; standing at counter with steps set up on top of counter to belly button height of patient, yard stick placed on top toward patient as target for patient to aim for, weight shifting ant/post touching stomach to stick and then back,emphasizing weight shifting through hips and not through knees or trunk leans; verbal cues for posture and to keep knees straight with PT in front of patient's knees  for tactile cues to keep them straight      Exercises   Exercises Knee/Hip   Other Exercises  Standing gluteal sets and quad sets to help with LE muscle enagement during standing tasks.     Knee/Hip Exercises: Standing   Terminal Knee Extension Strengthening;Right;Left;1 set;Theraband;15 reps   Theraband Level (Terminal Knee Extension) Level 2 (Red)   Terminal Knee Extension Limitations patient first attempted TKE after ambulating around gym and was unable to isolate quad muscle to straighten knee, after sitting rest break patient was able to straighten knee with verbal and tactile cues to isolate quad without hip and trunk compensation    Hip Extension AROM;Stengthening;Right;Left;1 set;10 reps;Knee straight  cues for patient to keep knees straight and tall posture     Extension Limitations hip extension with glute set at end of ROM, patient needs verbal and tactile cues to keep knee and hips straight to isolate correct muscles                   PT Short Term Goals - 10/20/16 1010      PT SHORT TERM GOAL #1   Title The patient will be indep with HEP for LE strengthening.  TARGET DATE FOR STGs:  10/30/2016   Time 4   Period Weeks   Status On-going     PT SHORT TERM GOAL #2   Title The patient will maintain standing x 5 minutes with intermittent UE support in order to perform ADLs from standing position for increased weight bearing.   Baseline Met on 10/20/16   Time 4   Period Weeks   Status Achieved     PT SHORT TERM GOAL #3   Title The patient will ambulate x 150 ft nonstop with RW and supervision for increased household mobility.   Baseline Met on 10/20/16 walking 230 ft nonstop.   Time 4   Period Weeks   Status Achieved     PT SHORT TERM GOAL #4   Title The patient will ambulate with forearm crutches x 30 ft with min A.   Time 4   Period Weeks   Status On-going     PT SHORT TERM GOAL #5   Title Assess gait speed and write LTG.   Time 4   Period Weeks   Status On-going           PT Long Term Goals - 10/01/16 1446      PT LONG TERM GOAL #1   Title The patient will be indep with post d/c HEP.  TARGET DATE ALL LTGS:  11/29/2016   Time 8   Period Weeks     PT LONG TERM GOAL #2   Title The patient will improve functional status score from 53% to > or equal to 62% to demo improved self perception of mobility.   Time 8   Period Weeks     PT LONG TERM GOAL #3   Title The patient will ambulate for short community distances with least restrictive device x 200 ft in order to walk into restaurants, MD appointments, barber shop, etc without using power chair.   Time 8   Period Weeks     PT LONG TERM GOAL #4   Title The patient will improve functional LE strength by demonstrating 10 x sit<>stand with one UE support.     Time 8    Period Weeks     PT LONG TERM GOAL #5   Title The patient will have LTG  for gait speed once assessed.   Time 8   Period Weeks               Plan - 10/22/16 1036    Clinical Impression Statement Today's session focused on gait using bilateral forearm crutches and standing strengthening and balance activites. patient was able to ambulate further with the forearm crutches using a 4pt gait pattern needing only +2 min guard due to patient's height and size. Patient cotinues to have trouble isolating quadriceps contractions to straighten his knees needing trunk and hip movement to assist the motion. Patient is progressing toward all goals and would benefit from continued PT to help reach those goals.    Clinical Impairments Affecting Rehab Potential nature of disease progression,  caution with intensity of ther ex as patient has had exacerbation of symptoms in past during episode of PT (this was early in dx and he appears more stable in clinical presentation at this time).   PT Frequency 2x / week   PT Duration 8 weeks   PT Treatment/Interventions ADLs/Self Care Home Management;DME Instruction;Therapeutic activities;Therapeutic exercise;Balance training;Neuromuscular re-education;Gait training;Stair training;Functional mobility training;Patient/family education;Manual techniques;Electrical Stimulation   PT Next Visit Plan Bilateral forearm crutches, gait extending distances with wide/tall RW emphasizing knee extension for heel strike, LE strengthening. Check STGS.   Consulted and Agree with Plan of Care Patient      Patient will benefit from skilled therapeutic intervention in order to improve the following deficits and impairments:  Abnormal gait, Difficulty walking, Impaired tone, Decreased endurance, Decreased activity tolerance, Decreased balance, Decreased mobility, Decreased strength, Impaired flexibility, Decreased knowledge of use of DME, Increased muscle spasms  Visit  Diagnosis: Muscle weakness (generalized)  Other abnormalities of gait and mobility  Other symptoms and signs involving the nervous system     Problem List Patient Active Problem List   Diagnosis Date Noted  . Obesity 06/30/2016  . Influenza 05/27/2016  . Right heart failure ([place]) 04/30/2016  . Coronary artery calcification 04/30/2016  . Palpitations 04/14/2016  . Hyperlipidemia 01/16/2016  . Prediabetes 09/13/2015  . Gout 08/10/2015  . History of hypertension 08/10/2015  . [place] intertrigo 08/10/2015  . Chronic pain syndrome 07/11/2015  . History of pulmonary embolus ([place]) 07/23/2012  . Hematuria 06/28/2012  . Cardiac arrest ([place]) 06/23/2012  . Transverse myelitis ([place]) 04/21/2012  . Closed right ankle fracture 04/19/2012  . Chronic back pain     Skip Estimable, SPTA 10/22/2016, 10:50 AM  [facility] [address] [place], [place], [address] Phone: [phone]   Fax:  [phone]  Name: Matthew Juarez MRN: [id number] Date of Birth: 05-05-1960

## 2016-10-23 ENCOUNTER — Other Ambulatory Visit: Payer: Self-pay | Admitting: Family Medicine

## 2016-10-23 ENCOUNTER — Ambulatory Visit (INDEPENDENT_AMBULATORY_CARE_PROVIDER_SITE_OTHER): Payer: Medicare Other | Admitting: Family Medicine

## 2016-10-23 ENCOUNTER — Encounter: Payer: Self-pay | Admitting: Family Medicine

## 2016-10-23 VITALS — BP 112/72 | HR 65 | Temp 98.2°F | Resp 18 | Ht 77.0 in | Wt 312.0 lb

## 2016-10-23 DIAGNOSIS — I2510 Atherosclerotic heart disease of native coronary artery without angina pectoris: Secondary | ICD-10-CM | POA: Diagnosis not present

## 2016-10-23 DIAGNOSIS — R7303 Prediabetes: Secondary | ICD-10-CM | POA: Diagnosis not present

## 2016-10-23 DIAGNOSIS — Z6837 Body mass index (BMI) 37.0-37.9, adult: Secondary | ICD-10-CM | POA: Diagnosis not present

## 2016-10-23 DIAGNOSIS — E785 Hyperlipidemia, unspecified: Secondary | ICD-10-CM

## 2016-10-23 DIAGNOSIS — Z79899 Other long term (current) drug therapy: Secondary | ICD-10-CM | POA: Insufficient documentation

## 2016-10-23 DIAGNOSIS — I2584 Coronary atherosclerosis due to calcified coronary lesion: Secondary | ICD-10-CM

## 2016-10-23 DIAGNOSIS — R17 Unspecified jaundice: Secondary | ICD-10-CM

## 2016-10-23 DIAGNOSIS — K219 Gastro-esophageal reflux disease without esophagitis: Secondary | ICD-10-CM

## 2016-10-23 DIAGNOSIS — Z125 Encounter for screening for malignant neoplasm of prostate: Secondary | ICD-10-CM

## 2016-10-23 DIAGNOSIS — E66812 Obesity, class 2: Secondary | ICD-10-CM

## 2016-10-23 HISTORY — DX: Other long term (current) drug therapy: Z79.899

## 2016-10-23 LAB — COMPREHENSIVE METABOLIC PANEL
ALT: 26 U/L (ref 0–53)
AST: 19 U/L (ref 0–37)
Albumin: 4.4 g/dL (ref 3.5–5.2)
Alkaline Phosphatase: 83 U/L (ref 39–117)
BILIRUBIN TOTAL: 1.9 mg/dL — AB (ref 0.2–1.2)
BUN: 14 mg/dL (ref 6–23)
CO2: 30 mEq/L (ref 19–32)
CREATININE: 0.93 mg/dL (ref 0.40–1.50)
Calcium: 9.7 mg/dL (ref 8.4–10.5)
Chloride: 103 mEq/L (ref 96–112)
GFR: 107.33 mL/min (ref >60.00)
GLUCOSE: 85 mg/dL (ref 70–99)
Potassium: 4.7 mEq/L (ref 3.5–5.1)
Sodium: 138 mEq/L (ref 135–145)
Total Protein: 6.5 g/dL (ref 6.0–8.3)

## 2016-10-23 LAB — PSA, MEDICARE: PSA: 0.46 ng/mL (ref 0.10–4.00)

## 2016-10-23 LAB — LIPID PANEL
CHOL/HDL RATIO: 3
Cholesterol: 116 mg/dL (ref 0–200)
HDL: 40.4 mg/dL (ref >39.00)
LDL Cholesterol: 58 mg/dL (ref 0–99)
NONHDL: 75.47
Triglycerides: 86 mg/dL (ref 0.0–149.0)
VLDL: 17.2 mg/dL (ref 0.0–40.0)

## 2016-10-23 LAB — HEMOGLOBIN A1C: Hgb A1c MFr Bld: 5.7 % (ref 4.6–6.5)

## 2016-10-23 MED ORDER — ESOMEPRAZOLE MAGNESIUM 20 MG PO CPDR: 20.0000 mg | DELAYED_RELEASE_CAPSULE | Freq: Every day | ORAL | 3 refills | Status: DC

## 2016-10-23 NOTE — Assessment & Plan Note (Signed)
Check lipid panel.  Continue Lipitor. 

## 2016-10-23 NOTE — Assessment & Plan Note (Signed)
Congratulated patient on 9 pound weight loss. He'll continue to exercise and dietary changes. Discussed that 1-2 pounds of weight loss a week his ideal for long-term weight loss.

## 2016-10-23 NOTE — Progress Notes (Signed)
  [name], MD Phone: [phone]  Matthew Juarez is a 56 y.o. male who presents today for f/u.  HYPERLIPIDEMIA Symptoms Chest pain on exertion:  no   Medications: Compliance- taking lipitor Right upper quadrant pain- no  Muscle aches- no  Diabetes: No polyuria or polydipsia. No vision changes. He's been working on diet and exercise.  Obesity: He is down 9 pounds over the last month or so. He's been doing physical therapy which is helped with his activity level. After walking and standing. He notes he has changed his diet quite a bit as well. He has cut out fried foods, sweets, and soda. He is eating a lot of vegetables, chicken, fish, and fruits.  Prostate cancer screening: Patient will like to continue this. I had a discussion with him regarding the PSA test and potential outcomes if elevated.  GERD: Taking Nexium. Rare symptoms with this. No blood in his stool or abdominal pain.  PMH: no former smoker   ROS see history of present illness  Objective  Physical Exam Vitals:   10/23/16 1004  BP: 112/72  Pulse: 65  Resp: 18  Temp: 98.2 F (36.8 C)    BP Readings from Last 3 Encounters:  10/23/16 112/72  08/21/16 122/82  06/30/16 102/68   Wt Readings from Last 3 Encounters:  10/23/16 (!) 312 lb (141.5 kg)  08/21/16 (!) 321 lb 1.9 oz (145.7 kg)  06/30/16 (!) 323 lb (146.5 kg)    Physical Exam  Constitutional: No distress.  HENT:  Head: Normocephalic and atraumatic.  Mouth/Throat: Oropharynx is clear and moist. No oropharyngeal exudate.  Eyes: Pupils are equal, round, and reactive to light. Conjunctivae are normal.  Cardiovascular: Normal rate, regular rhythm and normal heart sounds.   Pulmonary/Chest: Effort normal and breath sounds normal.  Abdominal: Soft. Bowel sounds are normal. He exhibits no distension. There is no tenderness.  Genitourinary:  Genitourinary Comments: Prostate normal with no nodules  Musculoskeletal: He exhibits no edema.    Neurological: He is alert.  Skin: Skin is warm and dry. He is not diaphoretic.     Assessment/Plan: Please see individual problem list.  Prediabetes Check A1c. Continue diet and exercise.  Hyperlipidemia Check lipid panel. Continue Lipitor.  Obesity Congratulated patient on 9 pound weight loss. He'll continue to exercise and dietary changes. Discussed that 1-2 pounds of weight loss a week his ideal for long-term weight loss.  GERD (gastroesophageal reflux disease) Well-controlled on Nexium. Prescription sent to pharmacy.  Prostate cancer screening Prostate exam completed. PSA will be checked.   Orders Placed This Encounter  Procedures  . Comp Met (CMET)  . Lipid Profile  . HgB A1c  . PSA, Medicare    Meds ordered this encounter  Medications  . esomeprazole (NEXIUM) 20 MG capsule    Sig: Take 1 capsule (20 mg total) by mouth daily before breakfast.    Dispense:  90 capsule    Refill:  3    # Healthcare Maintenance: We'll request a copy of his most recent colonoscopy from Dr. Collene Mares.   [name], MD [place]

## 2016-10-23 NOTE — Assessment & Plan Note (Signed)
Prostate exam completed. PSA will be checked.

## 2016-10-23 NOTE — Telephone Encounter (Signed)
I have completed this order in Epic. Please let me know if you need anything additional. Minerva AreolaEric.

## 2016-10-23 NOTE — Assessment & Plan Note (Signed)
Well-controlled on Nexium. Prescription sent to pharmacy.

## 2016-10-23 NOTE — Patient Instructions (Signed)
Nice to see you. Please continue to work on physical therapy and diet and exercise. We'll continue to monitor your weight. We'll check lab work today and contact you with the results.

## 2016-10-23 NOTE — Assessment & Plan Note (Signed)
Check A1c.  Continue diet and exercise. 

## 2016-10-24 NOTE — Progress Notes (Signed)
Patient would like to have the generic version of nexium sent in due to price for pick up.

## 2016-10-27 ENCOUNTER — Ambulatory Visit: Payer: Medicare Other | Admitting: Rehabilitative and Restorative Service Providers"

## 2016-10-30 ENCOUNTER — Ambulatory Visit: Payer: Medicare Other | Attending: Neurology | Admitting: Rehabilitative and Restorative Service Providers"

## 2016-10-30 DIAGNOSIS — R2689 Other abnormalities of gait and mobility: Secondary | ICD-10-CM | POA: Diagnosis not present

## 2016-10-30 DIAGNOSIS — R29818 Other symptoms and signs involving the nervous system: Secondary | ICD-10-CM | POA: Diagnosis not present

## 2016-10-30 DIAGNOSIS — M6281 Muscle weakness (generalized): Secondary | ICD-10-CM | POA: Diagnosis not present

## 2016-10-30 NOTE — Patient Instructions (Signed)
Medical supply stores: [facility] on [address] [phone]  [facility] on [address] or in [place] [phone] for both locations  [facility] on [address] [phone] 

## 2016-10-30 NOTE — Therapy (Signed)
[facility] [address] [place], [place], [address] Phone: [phone]   Fax:  [phone]  Physical Therapy Treatment  Patient Details  Name: Matthew Juarez MRN: [id number] Date of Birth: 1960-06-19 Referring Provider: Reinaldo Berber, MD  Encounter Date: 10/30/2016      PT End of Session - 10/30/16 0957    Visit Number 7   Number of Visits 17   Date for PT Re-Evaluation 11/29/16   Authorization Type G code every 10th visit   PT Start Time 0935   PT Stop Time 1015   PT Time Calculation (min) 40 min   Equipment Utilized During Treatment Gait belt   Activity Tolerance Patient tolerated treatment well   Behavior During Therapy [facility] for tasks assessed/performed      Past Medical History:  Diagnosis Date  . Arthritis   . Chronic back pain   . Gout   . Hammer toe   . Headache(784.0)   . History of blood clots   . Hypertension   . Pulmonary embolism ([place])   . Transverse myelitis [facility])     Past Surgical History:  Procedure Laterality Date  . HAMMER TOE SURGERY     2016    There were no vitals filed for this visit.      Subjective Assessment - 10/30/16 0939    Subjective The patient is walking at home, standing for brushing teeth.     Patient Stated Goals "I want to get stronger and use the forearm crutches again".  He notes being able to go to the grocery store or barber shop without w/c would be a long term goal.     Currently in Pain? No/denies                         [facility] Adult PT Treatment/Exercise - 10/30/16 0958      Ambulation/Gait   Ambulation/Gait Yes   Ambulation/Gait Assistance 4: Min assist;5: Supervision   Ambulation/Gait Assistance Details supervision for gait with RW x 360 feet, min A for 115 ft with forearm crutches.   Patient performs 4 point gait pattern.   Assistive device R Forearm Crutch;L Forearm Crutch   Ambulation Surface Level;Indoor   Gait velocity 1.59  ft/sec indicating "limited community ambulator" classification of gait.     Neuro Re-ed    Neuro Re-ed Details  Standing balance activiites near a wall for support with walker positioned anteriorly using intermittent UE support--performed wall bumps working on balance control and ant/posterior weight shifting.  Performed standing in stride position emphasizing knee extension of posterior LE.  Patient requires CGA to min A for safety.       Exercises   Other Exercises  Thomas test hip flexor stretch on elevated mat table with passive overpressure.                PT Education - 10/30/16 1441    Education provided Yes   Education Details provided patient order for RW and DME companies near our clinic.   Person(s) Educated Patient   Methods Explanation;Handout   Comprehension Verbalized understanding          PT Short Term Goals - 10/30/16 0957      PT SHORT TERM GOAL #1   Title The patient will be indep with HEP for LE strengthening.  TARGET DATE FOR STGs:  10/30/2016   Baseline per subjective report   Time 4   Period Weeks  Status Achieved     PT SHORT TERM GOAL #2   Title The patient will maintain standing x 5 minutes with intermittent UE support in order to perform ADLs from standing position for increased weight bearing.   Baseline Met on 10/20/16   Time 4   Period Weeks   Status Achieved     PT SHORT TERM GOAL #3   Title The patient will ambulate x 150 ft nonstop with RW and supervision for increased household mobility.   Baseline Met on 10/20/16 walking 230 ft nonstop.   Time 4   Period Weeks   Status Achieved     PT SHORT TERM GOAL #4   Title The patient will ambulate with forearm crutches x 30 ft with min A.   Baseline Ambulated 115 ft with min A + bilateral forearm crutches   Time 4   Period Weeks   Status Achieved     PT SHORT TERM GOAL #5   Title Assess gait speed and write LTG.   Baseline Patient ambulates 1.59 ft/sec with rolling walker.   Time 4    Period Weeks   Status Achieved           PT Long Term Goals - 10/01/16 1446      PT LONG TERM GOAL #1   Title The patient will be indep with post d/c HEP.  TARGET DATE ALL LTGS:  11/29/2016   Time 8   Period Weeks     PT LONG TERM GOAL #2   Title The patient will improve functional status score from 53% to > or equal to 62% to demo improved self perception of mobility.   Time 8   Period Weeks     PT LONG TERM GOAL #3   Title The patient will ambulate for short community distances with least restrictive device x 200 ft in order to walk into restaurants, MD appointments, barber shop, etc without using power chair.   Time 8   Period Weeks     PT LONG TERM GOAL #4   Title The patient will improve functional LE strength by demonstrating 10 x sit<>stand with one UE support.     Time 8   Period Weeks     PT LONG TERM GOAL #5   Title The patient will have LTG for gait speed once assessed.   Time 8   Period Weeks               Plan - 10/30/16 1741    Clinical Impression Statement The patient is progressing walking distance and reducing assistance needed with gait with forearm crutches.  Continue working towards [employer].  Patinet met all STGs.    Clinical Impairments Affecting Rehab Potential nature of disease progression,  caution with intensity of ther ex as patient has had exacerbation of symptoms in past during episode of PT (this was early in dx and he appears more stable in clinical presentation at this time).   PT Treatment/Interventions ADLs/Self Care Home Management;DME Instruction;Therapeutic activities;Therapeutic exercise;Balance training;Neuromuscular re-education;Gait training;Stair training;Functional mobility training;Patient/family education;Manual techniques;Electrical Stimulation   PT Next Visit Plan Bilateral forearm crutches, gait extending distances with wide/tall RW emphasizing knee extension for heel strike, LE strengthening.   Consulted and Agree with  Plan of Care Patient      Patient will benefit from skilled therapeutic intervention in order to improve the following deficits and impairments:  Abnormal gait, Difficulty walking, Impaired tone, Decreased endurance, Decreased activity tolerance, Decreased balance, Decreased mobility, Decreased  strength, Impaired flexibility, Decreased knowledge of use of DME, Increased muscle spasms  Visit Diagnosis: Muscle weakness (generalized)  Other abnormalities of gait and mobility  Other symptoms and signs involving the nervous system     Problem List Patient Active Problem List   Diagnosis Date Noted  . GERD (gastroesophageal reflux disease) 10/23/2016  . Prostate cancer screening 10/23/2016  . Obesity 06/30/2016  . Influenza 05/27/2016  . Right heart failure ([place]) 04/30/2016  . Coronary artery calcification 04/30/2016  . Palpitations 04/14/2016  . Hyperlipidemia 01/16/2016  . Prediabetes 09/13/2015  . Gout 08/10/2015  . History of hypertension 08/10/2015  . Candidal intertrigo 08/10/2015  . Chronic pain syndrome 07/11/2015  . History of pulmonary embolus (PE) 07/23/2012  . Hematuria 06/28/2012  . Cardiac arrest ([place]) 06/23/2012  . Transverse myelitis ([place]) 04/21/2012  . Closed right ankle fracture 04/19/2012  . Chronic back pain     Geovanie Winnett, PT 10/30/2016, 5:43 PM  [facility] [address] [place], [place], [address] Phone: [phone]   Fax:  [phone]  Name: Matthew Juarez MRN: [id number] Date of Birth: 24-Jan-1961

## 2016-11-03 ENCOUNTER — Ambulatory Visit: Payer: Medicare Other | Admitting: Physical Therapy

## 2016-11-04 ENCOUNTER — Other Ambulatory Visit (INDEPENDENT_AMBULATORY_CARE_PROVIDER_SITE_OTHER): Payer: Medicare Other

## 2016-11-04 DIAGNOSIS — R17 Unspecified jaundice: Secondary | ICD-10-CM

## 2016-11-04 LAB — BILIRUBIN, FRACTIONATED(TOT/DIR/INDIR)
BILIRUBIN DIRECT: 0.4 mg/dL — AB (ref <=0.2)
Indirect Bilirubin: 1.1 mg/dL (ref 0.2–1.2)
Total Bilirubin: 1.5 mg/dL — ABNORMAL HIGH (ref 0.2–1.2)

## 2016-11-04 NOTE — Addendum Note (Signed)
Addended by: Warden FillersWRIGHT, LATOYA S on: 11/04/2016 11:14 AM   Modules accepted: Orders

## 2016-11-05 ENCOUNTER — Ambulatory Visit: Payer: Medicare Other | Admitting: Physical Therapy

## 2016-11-05 ENCOUNTER — Encounter: Payer: Self-pay | Admitting: Physical Therapy

## 2016-11-05 DIAGNOSIS — M6281 Muscle weakness (generalized): Secondary | ICD-10-CM | POA: Diagnosis not present

## 2016-11-05 DIAGNOSIS — R29818 Other symptoms and signs involving the nervous system: Secondary | ICD-10-CM | POA: Diagnosis not present

## 2016-11-05 DIAGNOSIS — R2689 Other abnormalities of gait and mobility: Secondary | ICD-10-CM | POA: Diagnosis not present

## 2016-11-07 ENCOUNTER — Ambulatory Visit: Payer: Medicare Other | Admitting: Rehabilitative and Restorative Service Providers"

## 2016-11-07 DIAGNOSIS — R29818 Other symptoms and signs involving the nervous system: Secondary | ICD-10-CM

## 2016-11-07 DIAGNOSIS — M6281 Muscle weakness (generalized): Secondary | ICD-10-CM

## 2016-11-07 DIAGNOSIS — R2689 Other abnormalities of gait and mobility: Secondary | ICD-10-CM | POA: Diagnosis not present

## 2016-11-07 NOTE — Therapy (Signed)
[facility] [address] [place], [place], [address] Phone: [phone]   Fax:  [phone]  Physical Therapy Treatment  Patient Details  Name: Matthew Juarez MRN: [id number] Date of Birth: Feb 10, 1961 Referring Provider: Reinaldo Berber, MD  Encounter Date: 11/05/2016   11/05/16 1022  PT Visits / Re-Eval  Visit Number 8  Number of Visits 17  Date for PT Re-Evaluation 11/29/16  Authorization  Authorization Type G code every 10th visit  PT Time Calculation  PT Start Time 1018  PT Stop Time 1100  PT Time Calculation (min) 42 min  PT - End of Session  Equipment Utilized During Treatment Gait belt  Activity Tolerance Patient tolerated treatment well  Behavior During Therapy [facility] for tasks assessed/performed     Past Medical History:  Diagnosis Date  . Arthritis   . Chronic back pain   . Gout   . Hammer toe   . Headache(784.0)   . History of blood clots   . Hypertension   . Pulmonary embolism ([place])   . Transverse myelitis [facility])     Past Surgical History:  Procedure Laterality Date  . HAMMER TOE SURGERY     2016    There were no vitals filed for this visit.     11/05/16 1021  Symptoms/Limitations  Subjective No new complaints. No falls or pain to report. Walking at home with spouse. Has his manual chair today because his Lucianne Lei is in shop getting repairs.  Patient Stated Goals "I want to get stronger and use the forearm crutches again".  He notes being able to go to the grocery store or barber shop without w/c would be a long term goal.        11/05/16 1023  Transfers  Transfers Sit to Stand;Stand to Sit  Sit to Stand 5: Supervision  Stand Pivot Transfers 5: Supervision  Ambulation/Gait  Ambulation/Gait Yes  Ambulation/Gait Assistance 5: Supervision;4: Min guard  Ambulation/Gait Assistance Details increased episodes of right foot scuffing with gait during last 50-60 feet with pt needing to rest  aftewards.   Ambulation Distance (Feet) 230 Feet  Assistive device Rolling walker  Gait Pattern Trunk flexed;Decreased trunk rotation;Decreased step length - right;Decreased step length - left  Ambulation Surface Level;Indoor  Knee/Hip Exercises: Stretches  Hip Flexor Stretch Both;1 rep;60 seconds;Limitations  Hip Flexor Stretch Limitations overpressure to increase stretching  Knee/Hip Exercises: Standing  Other Standing Knee Exercises sit<>stand with OH press using 2# weighted ball, 2 sets of 10 reps.   Knee/Hip Exercises: Supine  Bridges AROM;Both;2 sets;10 reps;Limitations  Bridges Limitations x 10 reps with manual stability of legs, then 10 reps with ball squeeze for self stabilization of legs  Knee/Hip Exercises: Prone  Hip Extension AROM;AAROM;Strengthening;Both;1 set;10 reps;Limitations  Hip Extension Limitations cue for form and technique. limited range noted with lifting.   Other Prone Exercises glut kick backs x 10 reps each side with AA to maintain correct ex form/technique           PT Short Term Goals - 10/30/16 0957      PT SHORT TERM GOAL #1   Title The patient will be indep with HEP for LE strengthening.  TARGET DATE FOR STGs:  10/30/2016   Baseline per subjective report   Time 4   Period Weeks   Status Achieved     PT SHORT TERM GOAL #2   Title The patient will maintain standing x 5 minutes with intermittent UE support in order to perform ADLs  from standing position for increased weight bearing.   Baseline Met on 10/20/16   Time 4   Period Weeks   Status Achieved     PT SHORT TERM GOAL #3   Title The patient will ambulate x 150 ft nonstop with RW and supervision for increased household mobility.   Baseline Met on 10/20/16 walking 230 ft nonstop.   Time 4   Period Weeks   Status Achieved     PT SHORT TERM GOAL #4   Title The patient will ambulate with forearm crutches x 30 ft with min A.   Baseline Ambulated 115 ft with min A + bilateral forearm crutches    Time 4   Period Weeks   Status Achieved     PT SHORT TERM GOAL #5   Title Assess gait speed and write LTG.   Baseline Patient ambulates 1.59 ft/sec with rolling walker.   Time 4   Period Weeks   Status Achieved           PT Long Term Goals - 10/01/16 1446      PT LONG TERM GOAL #1   Title The patient will be indep with post d/c HEP.  TARGET DATE ALL LTGS:  11/29/2016   Time 8   Period Weeks     PT LONG TERM GOAL #2   Title The patient will improve functional status score from 53% to > or equal to 62% to demo improved self perception of mobility.   Time 8   Period Weeks     PT LONG TERM GOAL #3   Title The patient will ambulate for short community distances with least restrictive device x 200 ft in order to walk into restaurants, MD appointments, barber shop, etc without using power chair.   Time 8   Period Weeks     PT LONG TERM GOAL #4   Title The patient will improve functional LE strength by demonstrating 10 x sit<>stand with one UE support.     Time 8   Period Weeks     PT LONG TERM GOAL #5   Title The patient will have LTG for gait speed once assessed.   Time 8   Period Weeks        11/05/16 1022  Plan  Clinical Impression Statement Today's skilled session focused on LE strengthening and then gait distance with RW for increased activity tolerance. Gait was limited however due to increased right foot scuffing with gait due to pt reported fatigue. Pt is progressing toward goals and should benefit from continued PT to progress toward unmet goals.   Pt will benefit from skilled therapeutic intervention in order to improve on the following deficits Abnormal gait;Difficulty walking;Impaired tone;Decreased endurance;Decreased activity tolerance;Decreased balance;Decreased mobility;Decreased strength;Impaired flexibility;Decreased knowledge of use of DME;Increased muscle spasms  Clinical Impairments Affecting Rehab Potential nature of disease progression,  caution with  intensity of ther ex as patient has had exacerbation of symptoms in past during episode of PT (this was early in dx and he appears more stable in clinical presentation at this time).  PT Treatment/Interventions ADLs/Self Care Home Management;DME Instruction;Therapeutic activities;Therapeutic exercise;Balance training;Neuromuscular re-education;Gait training;Stair training;Functional mobility training;Patient/family education;Manual techniques;Electrical Stimulation  PT Next Visit Plan Bilateral forearm crutches, gait extending distances with wide/tall RW emphasizing knee extension for heel strike, LE strengthening.  Consulted and Agree with Plan of Care Patient       Patient will benefit from skilled therapeutic intervention in order to improve the following deficits and impairments:  Abnormal gait, Difficulty walking, Impaired tone, Decreased endurance, Decreased activity tolerance, Decreased balance, Decreased mobility, Decreased strength, Impaired flexibility, Decreased knowledge of use of DME, Increased muscle spasms  Visit Diagnosis: Muscle weakness (generalized)  Other abnormalities of gait and mobility     Problem List Patient Active Problem List   Diagnosis Date Noted  . GERD (gastroesophageal reflux disease) 10/23/2016  . Prostate cancer screening 10/23/2016  . Obesity 06/30/2016  . Influenza 05/27/2016  . Right heart failure ([place]) 04/30/2016  . Coronary artery calcification 04/30/2016  . Palpitations 04/14/2016  . Hyperlipidemia 01/16/2016  . Prediabetes 09/13/2015  . Gout 08/10/2015  . History of hypertension 08/10/2015  . Candidal intertrigo 08/10/2015  . Chronic pain syndrome 07/11/2015  . History of pulmonary embolus (PE) 07/23/2012  . Hematuria 06/28/2012  . Cardiac arrest ([place]) 06/23/2012  . Transverse myelitis ([place]) 04/21/2012  . Closed right ankle fracture 04/19/2012  . Chronic back pain     Willow Ora, PTA, [facility] [address] [place], [place] [address] [phone] 11/07/16, 10:25 AM   Name: Matthew Juarez MRN: [id number] Date of Birth: 1961-03-17

## 2016-11-07 NOTE — Therapy (Signed)
[facility] [address] [place], [place], [address] Phone: [phone]   Fax:  [phone]  Physical Therapy Treatment  Patient Details  Name: Matthew Juarez MRN: [id number] Date of Birth: 03/08/1961 Referring Provider: Reinaldo Berber, MD  Encounter Date: 11/07/2016      PT End of Session - 11/07/16 1126    Visit Number 9   Number of Visits 17   Date for PT Re-Evaluation 11/29/16   Authorization Type G code every 10th visit   PT Start Time 1106   PT Stop Time 1146   PT Time Calculation (min) 40 min   Equipment Utilized During Treatment Gait belt   Activity Tolerance Patient tolerated treatment well   Behavior During Therapy [facility] for tasks assessed/performed      Past Medical History:  Diagnosis Date  . Arthritis   . Chronic back pain   . Gout   . Hammer toe   . Headache(784.0)   . History of blood clots   . Hypertension   . Pulmonary embolism ([place])   . Transverse myelitis [facility])     Past Surgical History:  Procedure Laterality Date  . HAMMER TOE SURGERY     2016    There were no vitals filed for this visit.      Subjective Assessment - 11/07/16 1110    Subjective The patient reports he did not obtain RW with new order b/c he was unsure if RW or rollater indicated.  PT recommended RW versus rollater.  Patient notes doing well with exercises at home.  Still standing doing teeth brushing and some ADLs.    Patient Stated Goals "I want to get stronger and use the forearm crutches again".  He notes being able to go to the grocery store or barber shop without w/c would be a long term goal.     Currently in Pain? No/denies                         [facility] Adult PT Treatment/Exercise - 11/07/16 1139      Ambulation/Gait   Ambulation/Gait Yes   Ambulation/Gait Assistance 5: Supervision;4: Min guard   Ambulation/Gait Assistance Details supervision to [place] provided.  After 230 ft, patient's  right foot began to scuff during swing phase of gait activities.     Ambulation Distance (Feet) 460 Feet   Assistive device Rolling walker   Ambulation Surface Level;Indoor   Gait Comments Walked with RW on level/paved outdoor surfaces x 100 ft with CGA, stepped up/down on curb with RW with CGA.       Exercises   Exercises Other Exercises   Other Exercises  Seated exercises performing marching lateral>medial encouraging ankle DF, seated ankle DF x 10 reps, seated ankle DF and eversion x 10 reps x 2 sets.    Sit<>stand x 3 reps with one UE support.  Standing dec'ing UE support with supervision.                  PT Short Term Goals - 10/30/16 0957      PT SHORT TERM GOAL #1   Title The patient will be indep with HEP for LE strengthening.  TARGET DATE FOR STGs:  10/30/2016   Baseline per subjective report   Time 4   Period Weeks   Status Achieved     PT SHORT TERM GOAL #2   Title The patient will maintain standing x 5 minutes with intermittent UE  support in order to perform ADLs from standing position for increased weight bearing.   Baseline Met on 10/20/16   Time 4   Period Weeks   Status Achieved     PT SHORT TERM GOAL #3   Title The patient will ambulate x 150 ft nonstop with RW and supervision for increased household mobility.   Baseline Met on 10/20/16 walking 230 ft nonstop.   Time 4   Period Weeks   Status Achieved     PT SHORT TERM GOAL #4   Title The patient will ambulate with forearm crutches x 30 ft with min A.   Baseline Ambulated 115 ft with min A + bilateral forearm crutches   Time 4   Period Weeks   Status Achieved     PT SHORT TERM GOAL #5   Title Assess gait speed and write LTG.   Baseline Patient ambulates 1.59 ft/sec with rolling walker.   Time 4   Period Weeks   Status Achieved           PT Long Term Goals - 10/01/16 1446      PT LONG TERM GOAL #1   Title The patient will be indep with post d/c HEP.  TARGET DATE ALL LTGS:  11/29/2016    Time 8   Period Weeks     PT LONG TERM GOAL #2   Title The patient will improve functional status score from 53% to > or equal to 62% to demo improved self perception of mobility.   Time 8   Period Weeks     PT LONG TERM GOAL #3   Title The patient will ambulate for short community distances with least restrictive device x 200 ft in order to walk into restaurants, MD appointments, barber shop, etc without using power chair.   Time 8   Period Weeks     PT LONG TERM GOAL #4   Title The patient will improve functional LE strength by demonstrating 10 x sit<>stand with one UE support.     Time 8   Period Weeks     PT LONG TERM GOAL #5   Title The patient will have LTG for gait speed once assessed.   Time 8   Period Weeks               Plan - 11/07/16 1248    Clinical Impression Statement Patient develops R foot drag with fatigue during ambulation which may hinder use of forearm crutches for community mobility.  The patient is continuing to stand more for ADLs in the home.     PT Treatment/Interventions ADLs/Self Care Home Management;DME Instruction;Therapeutic activities;Therapeutic exercise;Balance training;Neuromuscular re-education;Gait training;Stair training;Functional mobility training;Patient/family education;Manual techniques;Electrical Stimulation   PT Next Visit Plan Progress community distances with rolling walker, monitor R LE strength/endurance.  Emphasize knee extension for heel strike, LE strengthening.  Begin checking LTGs.   Consulted and Agree with Plan of Care Patient      Patient will benefit from skilled therapeutic intervention in order to improve the following deficits and impairments:  Abnormal gait, Difficulty walking, Impaired tone, Decreased endurance, Decreased activity tolerance, Decreased balance, Decreased mobility, Decreased strength, Impaired flexibility, Decreased knowledge of use of DME, Increased muscle spasms  Visit Diagnosis: Muscle weakness  (generalized)  Other abnormalities of gait and mobility  Other symptoms and signs involving the nervous system     Problem List Patient Active Problem List   Diagnosis Date Noted  . GERD (gastroesophageal reflux disease) 10/23/2016  .  Prostate cancer screening 10/23/2016  . Obesity 06/30/2016  . Influenza 05/27/2016  . Right heart failure ([place]) 04/30/2016  . Coronary artery calcification 04/30/2016  . Palpitations 04/14/2016  . Hyperlipidemia 01/16/2016  . Prediabetes 09/13/2015  . Gout 08/10/2015  . History of hypertension 08/10/2015  . Candidal intertrigo 08/10/2015  . Chronic pain syndrome 07/11/2015  . History of pulmonary embolus (PE) 07/23/2012  . Hematuria 06/28/2012  . Cardiac arrest ([place]) 06/23/2012  . Transverse myelitis ([place]) 04/21/2012  . Closed right ankle fracture 04/19/2012  . Chronic back pain     Johnjoseph Rolfe, PT 11/07/2016, 1:00 PM  [facility] [address] [place], [place], [address] Phone: [phone]   Fax:  [phone]  Name: Matthew Juarez MRN: [id number] Date of Birth: 05/27/1960

## 2016-11-10 ENCOUNTER — Ambulatory Visit: Payer: Medicare Other | Admitting: Rehabilitative and Restorative Service Providers"

## 2016-11-12 ENCOUNTER — Ambulatory Visit: Payer: Medicare Other | Admitting: Rehabilitative and Restorative Service Providers"

## 2016-11-12 DIAGNOSIS — R29818 Other symptoms and signs involving the nervous system: Secondary | ICD-10-CM

## 2016-11-12 DIAGNOSIS — M6281 Muscle weakness (generalized): Secondary | ICD-10-CM

## 2016-11-12 DIAGNOSIS — R2689 Other abnormalities of gait and mobility: Secondary | ICD-10-CM

## 2016-11-12 NOTE — Patient Instructions (Signed)
CAREGIVER ASSISTED: Ankle Dorsiflexion    RIGHT LEG:  Caregiver cups hand under heel, rests foot on forearm; leans forward to move foot toward head. Hold _30__ seconds. __3_ reps per set, __1-2_ sets per day.   Copyright  VHI. All rights reserved.     Clam Shell 45 Degrees    Lying with hips and knees bent 45, one pillow between knees and ankles. Lift knee. Be sure pelvis does not roll backward. Do not arch back. Do _10__ times, rest and repeat. _1__ times per day.  http://ss.exer.us/74   Copyright  VHI. All rights reserved.   Gluteal Sets    Tighten buttocks while pressing pelvis to floor. Hold __5__ seconds. Repeat _10___ times per set.  Do __1-2__ sessions per day.  http://orth.exer.us/104   Copyright  VHI. All rights reserved.   Hamstring Curl: Resisted (Sitting)    Facing anchor with tubing on right ankle, leg straight out, bend knee. Repeat __5__ times, rest and then repeat. Do _1-2___ sessions per day.   http://orth.exer.us/668   Copyright  VHI. All rights reserved.  ANKLE: Eversion, Unilateral    SQUEEZE YOUR FIST BETWEEN YOUR KNEES (to keep hip from doing the work).  Sit at edge of surface, feet on floor. Raise toes of foot up and move away from body. Do not move hip or knee. _5__ reps, rest and repeat. 1-2 times/day.  Copyright  VHI. All rights reserved.   Functional Quadriceps: Sit to Stand    Sit on edge of chair, feet flat on floor. Stand upright, extending knees fully. HAVE YOUR WALKER IN FRONT OF YOU.  1) Think about squeezing bottom muscles to get to upright position. 2) You can use your hands as needed.  Repeat __10__ times per set. Do __1__ sets per session. Do __1-2__ sessions per day.  http://orth.exer.us/734   Copyright  VHI. All rights reserved.   Hip Flexor Stretch    Lying on back near edge of bed, keep leg on bed flat or bend at knee. Hang other leg over edge, relaxed, thigh resting entirely on bed for _30  sec -1min. Repeat _2___ times. Do _2-3___ sessions per day.

## 2016-11-12 NOTE — Therapy (Addendum)
[facility] [address] [place], [place], [address] Phone: [phone]   Fax:  [phone]  Physical Therapy Treatment  Patient Details  Name: Matthew Juarez MRN: [id number] Date of Birth: [birth year] Referring Provider: Reinaldo Berber, MD  Encounter Date: 11/12/2016      PT End of Session - 11/12/16 1025    Visit Number 10   Number of Visits 17   Date for PT Re-Evaluation 11/29/16   Authorization Type G code every 10th visit   PT Start Time 1020   PT Stop Time 1100   PT Time Calculation (min) 40 min   Equipment Utilized During Treatment Gait belt   Activity Tolerance Patient tolerated treatment well   Behavior During Therapy [facility] for tasks assessed/performed      Past Medical History:  Diagnosis Date  . Arthritis   . Chronic back pain   . Gout   . Hammer toe   . Headache(784.0)   . History of blood clots   . Hypertension   . Pulmonary embolism ([place])   . Transverse myelitis [facility])     Past Surgical History:  Procedure Laterality Date  . HAMMER TOE SURGERY     2016    There were no vitals filed for this visit.      Subjective Assessment - 11/12/16 1023    Subjective The patient did not get RW due to reports that it felt wobbly.  He has been doing his home exercises working on ankle strengthening.    Patient Stated Goals "I want to get stronger and use the forearm crutches again".  He notes being able to go to the grocery store or barber shop without w/c would be a long term goal.                           [facility] Adult PT Treatment/Exercise - 11/12/16 1047      Ambulation/Gait   Ambulation/Gait Yes   Ambulation/Gait Assistance 5: Supervision   Ambulation Distance (Feet) 230 Feet  x 2 reps   Assistive device Rolling walker   Ambulation Surface Level;Indoor   Gait Comments PT and patient discussed types of rolling walkers as he attempted to purchase one and it felt wobbly.  PT  wrote down "bariatric RW" due to height/weight.  Also showed patient double wheels to look for when purchasing RW.       Exercises   Exercises Other Exercises   Other Exercises  Seated ankle DF, seated ankle eversion x 10 reps, supine SLR x 10 reps with cues to avoid R LE crossing midline (want patient to keep hip at neutral), sidelying hip abduction with assistance x 5 reps x 2 sets, sidelying PNF D1/D2 for hip/knee control, sidelying clamshells x 10 reps x 2 sets.                 PT Education - 11/12/16 1106    Education provided Yes   Education Details HEP: discussed continued performance with current HEP and reviewed components of eversion/DF and hip abduction   Person(s) Educated Patient   Methods Explanation;Demonstration;Handout   Comprehension Verbalized understanding;Returned demonstration          PT Short Term Goals - 10/30/16 0957      PT SHORT TERM GOAL #1   Title The patient will be indep with HEP for LE strengthening.  TARGET DATE FOR STGs:  10/30/2016   Baseline per subjective report  Time 4   Period Weeks   Status Achieved     PT SHORT TERM GOAL #2   Title The patient will maintain standing x 5 minutes with intermittent UE support in order to perform ADLs from standing position for increased weight bearing.   Baseline Met on 10/20/16   Time 4   Period Weeks   Status Achieved     PT SHORT TERM GOAL #3   Title The patient will ambulate x 150 ft nonstop with RW and supervision for increased household mobility.   Baseline Met on 10/20/16 walking 230 ft nonstop.   Time 4   Period Weeks   Status Achieved     PT SHORT TERM GOAL #4   Title The patient will ambulate with forearm crutches x 30 ft with min A.   Baseline Ambulated 115 ft with min A + bilateral forearm crutches   Time 4   Period Weeks   Status Achieved     PT SHORT TERM GOAL #5   Title Assess gait speed and write LTG.   Baseline Patient ambulates 1.59 ft/sec with rolling walker.   Time 4    Period Weeks   Status Achieved           PT Long Term Goals - 10/01/16 1446      PT LONG TERM GOAL #1   Title The patient will be indep with post d/c HEP.  TARGET DATE ALL LTGS:  11/29/2016   Time 8   Period Weeks     PT LONG TERM GOAL #2   Title The patient will improve functional status score from 53% to > or equal to 62% to demo improved self perception of mobility.   Time 8   Period Weeks     PT LONG TERM GOAL #3   Title The patient will ambulate for short community distances with least restrictive device x 200 ft in order to walk into restaurants, MD appointments, barber shop, etc without using power chair.   Time 8   Period Weeks     PT LONG TERM GOAL #4   Title The patient will improve functional LE strength by demonstrating 10 x sit<>stand with one UE support.     Time 8   Period Weeks     PT LONG TERM GOAL #5   Title The patient will have LTG for gait speed once assessed.   Time 8   Period Weeks               Plan - 11/12/16 1114    Clinical Impression Statement The patient continues with fatigue in R foot limiting prolonged ambulation (stopped at 230 ft x 2 reps today).  We discussed bracing (AFO) options to be able to walk further distances, however patient has KAFO and does not wear due to difficulty donning and weight of brace.  PT to continue working towards LTG.    Clinical Impairments Affecting Rehab Potential nature of disease progression,  caution with intensity of ther ex as patient has had exacerbation of symptoms in past during episode of PT (this was early in dx and he appears more stable in clinical presentation at this time).   PT Treatment/Interventions ADLs/Self Care Home Management;DME Instruction;Therapeutic activities;Therapeutic exercise;Balance training;Neuromuscular re-education;Gait training;Stair training;Functional mobility training;Patient/family education;Manual techniques;Electrical Stimulation   PT Next Visit Plan Progress  community distances with rolling walker, monitor R LE strength/endurance.  Emphasize knee extension for heel strike, LE strengthening.  Begin checking LTGs.   Consulted and  Agree with Plan of Care Patient      Patient will benefit from skilled therapeutic intervention in order to improve the following deficits and impairments:  Abnormal gait, Difficulty walking, Impaired tone, Decreased endurance, Decreased activity tolerance, Decreased balance, Decreased mobility, Decreased strength, Impaired flexibility, Decreased knowledge of use of DME, Increased muscle spasms  Visit Diagnosis: Muscle weakness (generalized)  Other abnormalities of gait and mobility  Other symptoms and signs involving the nervous system     Problem List Patient Active Problem List   Diagnosis Date Noted  . GERD (gastroesophageal reflux disease) 10/23/2016  . Prostate cancer screening 10/23/2016  . Obesity 06/30/2016  . Influenza 05/27/2016  . Right heart failure ([place]) 04/30/2016  . Coronary artery calcification 04/30/2016  . Palpitations 04/14/2016  . Hyperlipidemia 01/16/2016  . Prediabetes 09/13/2015  . Gout 08/10/2015  . History of hypertension 08/10/2015  . Candidal intertrigo 08/10/2015  . Chronic pain syndrome 07/11/2015  . History of pulmonary embolus (PE) 07/23/2012  . Hematuria 06/28/2012  . Cardiac arrest ([place]) 06/23/2012  . Transverse myelitis ([place]) 04/21/2012  . Closed right ankle fracture 04/19/2012  . Chronic back pain    Physical Therapy Progress Note  Dates of Reporting Period: 09/30/2016 to 11/17/16  Objective Reports of Subjective Statement:  Increasing walking distance and standing tasks in the home.  Objective Measurements: Walking up to 460 ft in clinic, plans to obtain RW  Goal Update: see above.  Plan: see above  Reason Skilled Services are Required: Continue to work on progressing to limited community mobility.    [place] , PT 11/12/2016, 11:19 AM  [facility] [address] [place], [place], [address] Phone: [phone]   Fax:  [phone]  Name: Matthew Juarez MRN: [id number] Date of Birth: 1960-08-19

## 2016-11-17 ENCOUNTER — Ambulatory Visit: Payer: Medicare Other | Admitting: Rehabilitative and Restorative Service Providers"

## 2016-11-17 DIAGNOSIS — R29818 Other symptoms and signs involving the nervous system: Secondary | ICD-10-CM

## 2016-11-17 DIAGNOSIS — M6281 Muscle weakness (generalized): Secondary | ICD-10-CM | POA: Diagnosis not present

## 2016-11-17 DIAGNOSIS — R2689 Other abnormalities of gait and mobility: Secondary | ICD-10-CM | POA: Diagnosis not present

## 2016-11-17 NOTE — Therapy (Signed)
[facility] [address] [place], [place], [address] Phone: [phone]   Fax:  [phone]  Physical Therapy Treatment  Patient Details  Name: Matthew Juarez MRN: [id number] Date of Birth: 1960-12-30 Referring Provider: Reinaldo Berber, MD  Encounter Date: 11/17/2016      PT End of Session - 11/17/16 1332    Visit Number 11   Number of Visits 17   Date for PT Re-Evaluation 11/29/16   Authorization Type G code every 10th visit   PT Start Time 1016   PT Stop Time 1058   PT Time Calculation (min) 42 min   Equipment Utilized During Treatment Gait belt   Activity Tolerance Patient tolerated treatment well   Behavior During Therapy [facility] for tasks assessed/performed      Past Medical History:  Diagnosis Date  . Arthritis   . Chronic back pain   . Gout   . Hammer toe   . Headache(784.0)   . History of blood clots   . Hypertension   . Pulmonary embolism ([place])   . Transverse myelitis [facility])     Past Surgical History:  Procedure Laterality Date  . HAMMER TOE SURGERY     2016    There were no vitals filed for this visit.      Subjective Assessment - 11/17/16 1020    Subjective The patient got new rolling walker after our last session.  He has been walking in his home with rolling walker, used to go into/out of barber shop, and used at his niece's home.  Patient would estimate 50% use of walker versus w/c in the house.   Patient Stated Goals "I want to get stronger and use the forearm crutches again".  He notes being able to go to the grocery store or barber shop without w/c would be a long term goal.     Currently in Pain? No/denies                         [facility] Adult PT Treatment/Exercise - 11/17/16 1024      Ambulation/Gait   Ambulation/Gait Yes   Ambulation/Gait Assistance 5: Supervision   Ambulation Distance (Feet) 230 Feet  x 3 times   Assistive device Rolling walker   Ambulation  Surface Level;Indoor   Stairs Yes   Stairs Assistance 5: Supervision   Stair Management Technique Two rails;Alternating pattern   Number of Stairs 4   Gait Comments PT tried R blue rocker for last 230 ft distance due to some foot drop noted with fatigue.  The patient had increased ankle rolling into pronation with brace donned.     Therapeutic Activites    Therapeutic Activities Other Therapeutic Activities   Other Therapeutic Activities discussed increasing standing to do dishes and walking more in the home.     Neuro Re-ed    Neuro Re-ed Details  Standing balance activities moving R LE midline>R step x 5 reps, L LE midline>L step x 5 reps.  Performed standing with tactile cues for increased knee extension without flexing at hips/trunk.       Exercises   Exercises Other Exercises   Other Exercises  Seated hamstring stretching using 2 chairs and strap to increase ankle DF                PT Education - 11/17/16 1331    Education provided Yes   Education Details Added HEP hamstring stretch seated with strap around ball  of foot   Person(s) Educated Patient   Methods Explanation;Demonstration;Handout   Comprehension Verbalized understanding;Returned demonstration          PT Short Term Goals - 10/30/16 0957      PT SHORT TERM GOAL #1   Title The patient will be indep with HEP for LE strengthening.  TARGET DATE FOR STGs:  10/30/2016   Baseline per subjective report   Time 4   Period Weeks   Status Achieved     PT SHORT TERM GOAL #2   Title The patient will maintain standing x 5 minutes with intermittent UE support in order to perform ADLs from standing position for increased weight bearing.   Baseline Met on 10/20/16   Time 4   Period Weeks   Status Achieved     PT SHORT TERM GOAL #3   Title The patient will ambulate x 150 ft nonstop with RW and supervision for increased household mobility.   Baseline Met on 10/20/16 walking 230 ft nonstop.   Time 4   Period Weeks    Status Achieved     PT SHORT TERM GOAL #4   Title The patient will ambulate with forearm crutches x 30 ft with min A.   Baseline Ambulated 115 ft with min A + bilateral forearm crutches   Time 4   Period Weeks   Status Achieved     PT SHORT TERM GOAL #5   Title Assess gait speed and write LTG.   Baseline Patient ambulates 1.59 ft/sec with rolling walker.   Time 4   Period Weeks   Status Achieved           PT Long Term Goals - 11/17/16 1032      PT LONG TERM GOAL #1   Title The patient will be indep with post d/c HEP.  TARGET DATE ALL LTGS:  11/29/2016   Time 8   Period Weeks     PT LONG TERM GOAL #2   Title The patient will improve functional status score from 53% to > or equal to 62% to demo improved self perception of mobility.   Time 8   Period Weeks     PT LONG TERM GOAL #3   Title The patient will ambulate for short community distances with least restrictive device x 200 ft in order to walk into restaurants, MD appointments, barber shop, etc without using power chair.   Baseline Patient performed safe ambulation with RW into barber shop and into niece's home over weekend.   Time 8   Period Weeks   Status Achieved     PT LONG TERM GOAL #4   Title The patient will improve functional LE strength by demonstrating 10 x sit<>stand with one UE support.     Time 8   Period Weeks     PT LONG TERM GOAL #5   Title The patient will have LTG for gait speed once assessed.   Baseline 1.74 ft/sec assessed-- further PT will not increase to "full community status.  CURRENT status if "limited community ambulator".   Time 8   Period Weeks   Status Achieved               Plan - 11/17/16 1336    Clinical Impression Statement PT and patient discussed upcoming goal date and that at current status, the patient will now be able to perform short community distances with RW into familiar settings (restaurants, Paediatric nurse shop, medical appointments).  In order to move  from limited  community to longer distances, we may need to consider an AFO.  It may be advisable to first work on increasing household mobility with RW, then short community outings.  If he feels the R foot is limiting him, then let MD know he would like a brace.  If not pursing a brace (R AFO), PT should be on target for d/c.   PT Treatment/Interventions ADLs/Self Care Home Management;DME Instruction;Therapeutic activities;Therapeutic exercise;Balance training;Neuromuscular re-education;Gait training;Stair training;Functional mobility training;Patient/family education;Manual techniques;Electrical Stimulation   PT Next Visit Plan Limited community negotiation with RW, standing posture/knee + hip positioning, R LE strengthening.  Continue checking LTGs   Consulted and Agree with Plan of Care Patient      Patient will benefit from skilled therapeutic intervention in order to improve the following deficits and impairments:  Abnormal gait, Difficulty walking, Impaired tone, Decreased endurance, Decreased activity tolerance, Decreased balance, Decreased mobility, Decreased strength, Impaired flexibility, Decreased knowledge of use of DME, Increased muscle spasms  Visit Diagnosis: Muscle weakness (generalized)  Other abnormalities of gait and mobility  Other symptoms and signs involving the nervous system     Problem List Patient Active Problem List   Diagnosis Date Noted  . GERD (gastroesophageal reflux disease) 10/23/2016  . Prostate cancer screening 10/23/2016  . Obesity 06/30/2016  . Influenza 05/27/2016  . Right heart failure ([place]) 04/30/2016  . Coronary artery calcification 04/30/2016  . Palpitations 04/14/2016  . Hyperlipidemia 01/16/2016  . Prediabetes 09/13/2015  . Gout 08/10/2015  . History of hypertension 08/10/2015  . Candidal intertrigo 08/10/2015  . Chronic pain syndrome 07/11/2015  . History of pulmonary embolus (PE) 07/23/2012  . Hematuria 06/28/2012  . Cardiac arrest ([place])  06/23/2012  . Transverse myelitis ([place]) 04/21/2012  . Closed right ankle fracture 04/19/2012  . Chronic back pain     Porcha Deblanc, PT 11/17/2016, 1:39 PM  [facility] [address] [place], [place], [address] Phone: [phone]   Fax:  [phone]  Name: Matthew Juarez MRN: [id number] Date of Birth: 1960/05/03

## 2016-11-17 NOTE — Patient Instructions (Signed)
Hamstring Stretch, Seated (Strap, Two Chairs)    Sit with one leg extended onto facing chair. Loop strap over outstretched foot at ball of big toe. Lengthen spine. Hold for 20 seconds. Repeat __2__ times each leg.  Copyright  VHI. All rights reserved.

## 2016-11-19 ENCOUNTER — Ambulatory Visit: Payer: Medicare Other | Admitting: Physical Therapy

## 2016-11-19 ENCOUNTER — Encounter: Payer: Self-pay | Admitting: Physical Therapy

## 2016-11-19 DIAGNOSIS — M6281 Muscle weakness (generalized): Secondary | ICD-10-CM

## 2016-11-19 DIAGNOSIS — R29818 Other symptoms and signs involving the nervous system: Secondary | ICD-10-CM

## 2016-11-19 DIAGNOSIS — R2689 Other abnormalities of gait and mobility: Secondary | ICD-10-CM | POA: Diagnosis not present

## 2016-11-20 NOTE — Therapy (Signed)
[facility] [address] [place], [place], [address] Phone: [phone]   Fax:  [phone]  Physical Therapy Treatment  Patient Details  Name: Matthew Juarez MRN: [id number] Date of Birth: Aug 26, 1960 Referring Provider: Reinaldo Berber, MD  Encounter Date: 11/19/2016      PT End of Session - 11/19/16 0936    Visit Number 12   Number of Visits 17   Date for PT Re-Evaluation 11/29/16   Authorization Type G code every 10th visit   PT Start Time 0933   PT Stop Time 1015   PT Time Calculation (min) 42 min   Equipment Utilized During Treatment Gait belt   Activity Tolerance Patient tolerated treatment well   Behavior During Therapy [facility] for tasks assessed/performed      Past Medical History:  Diagnosis Date  . Arthritis   . Chronic back pain   . Gout   . Hammer toe   . Headache(784.0)   . History of blood clots   . Hypertension   . Pulmonary embolism ([place])   . Transverse myelitis [facility])     Past Surgical History:  Procedure Laterality Date  . HAMMER TOE SURGERY     2016    There were no vitals filed for this visit.      Subjective Assessment - 11/19/16 0936    Subjective No new complaints. Arrived to clinic today using RW. No falls or pain to report.    Patient Stated Goals "I want to get stronger and use the forearm crutches again".  He notes being able to go to the grocery store or barber shop without w/c would be a long term goal.     Currently in Pain? No/denies              [facility] Adult PT Treatment/Exercise - 11/19/16 0940      Transfers   Transfers Sit to Stand;Stand to Sit   Sit to Stand 5: Supervision;With upper extremity assist     Ambulation/Gait   Ambulation/Gait Yes   Ambulation/Gait Assistance 5: Supervision   Ambulation/Gait Assistance Details supervision with walking into clinic. min guard assist at one point during second rep of gait due to right  knee buckling. pt caught  and self corrected with use of UE's on RW.    Ambulation Distance (Feet) 120 Feet  x2   Assistive device Rolling walker   Gait Pattern Trunk flexed;Decreased trunk rotation;Decreased step length - right;Decreased step length - left   Ambulation Surface Level;Indoor     Knee/Hip Exercises: Stretches   Active Hamstring Stretch Both;1 rep;Limitations   Active Hamstring Stretch Limitations foot propped on stool with strap used. held each for 1-2 minutes     Knee/Hip Exercises: Standing   Heel Raises Both;1 set;10 reps;5 seconds;Limitations   Heel Raises Limitations manual blocking of right knee due to tendency to buckle at times with heel lifts. UE support on RW as well.   Knee Flexion AROM;Strengthening;Right;1 set;10 reps;Limitations   Knee Flexion Limitations decreased range noted as reps progressed due to fatigue of right leg.      Knee/Hip Exercises: Seated   Long Arc Quad Strengthening;1 set;10 reps;Weights;Right;Left   Long Arc Quad Weight 3 lbs.   Long [employer] Limitations cues for upright posture and slow, controlled motions., 5 sec hold with each rep.    Marching AROM;Both;1 set;10 reps;Weights   Marching Weights 3 lbs.   Hamstring Curl AROM;Strengthening;1 set;Both;10 reps;Limitations   Hamstring Limitations with red band  resistance: limited range on right leg, however pt was able to do all 10 reps within same range. on left held each rep for 5 seconds before releasing it.          PT Education - 11/20/16 1803    Education provided Yes   Education Details pt reported having issues with finding a cost effective bike to use at home that has pedals big enough for his feet. Discussed joining a community fitness center, such as the [employer] to use bikes and weight machines there.    Person(s) Educated Patient   Methods Explanation;Verbal cues   Comprehension Verbalized understanding;Need further instruction           PT Short Term Goals - 10/30/16 0957      PT SHORT TERM  GOAL #1   Title The patient will be indep with HEP for LE strengthening.  TARGET DATE FOR STGs:  10/30/2016   Baseline per subjective report   Time 4   Period Weeks   Status Achieved     PT SHORT TERM GOAL #2   Title The patient will maintain standing x 5 minutes with intermittent UE support in order to perform ADLs from standing position for increased weight bearing.   Baseline Met on 10/20/16   Time 4   Period Weeks   Status Achieved     PT SHORT TERM GOAL #3   Title The patient will ambulate x 150 ft nonstop with RW and supervision for increased household mobility.   Baseline Met on 10/20/16 walking 230 ft nonstop.   Time 4   Period Weeks   Status Achieved     PT SHORT TERM GOAL #4   Title The patient will ambulate with forearm crutches x 30 ft with min A.   Baseline Ambulated 115 ft with min A + bilateral forearm crutches   Time 4   Period Weeks   Status Achieved     PT SHORT TERM GOAL #5   Title Assess gait speed and write LTG.   Baseline Patient ambulates 1.59 ft/sec with rolling walker.   Time 4   Period Weeks   Status Achieved           PT Long Term Goals - 11/17/16 1032      PT LONG TERM GOAL #1   Title The patient will be indep with post d/c HEP.  TARGET DATE ALL LTGS:  11/29/2016   Time 8   Period Weeks     PT LONG TERM GOAL #2   Title The patient will improve functional status score from 53% to > or equal to 62% to demo improved self perception of mobility.   Time 8   Period Weeks     PT LONG TERM GOAL #3   Title The patient will ambulate for short community distances with least restrictive device x 200 ft in order to walk into restaurants, MD appointments, barber shop, etc without using power chair.   Baseline Patient performed safe ambulation with RW into barber shop and into niece's home over weekend.   Time 8   Period Weeks   Status Achieved     PT LONG TERM GOAL #4   Title The patient will improve functional LE strength by demonstrating 10 x  sit<>stand with one UE support.     Time 8   Period Weeks     PT LONG TERM GOAL #5   Title The patient will have LTG for gait speed once assessed.  Baseline 1.74 ft/sec assessed-- further PT will not increase to "full community status.  CURRENT status if "limited community ambulator".   Time 8   Period Weeks   Status Achieved        11/19/16 4961  Plan  Clinical Impression Statement Today's skilled session intitally was going to focus on increasing pt's gait distance with RW, howver with right knee  buckling today changed focus to LE strengthening. Pt's right knee continued to buckle with standing ex's needing manual assistance to provide stability for safety. Pt is progressing toward goals and should benefit from continued PT to progress toward goals.        Pt will benefit from skilled therapeutic intervention in order to improve on the following deficits Abnormal gait;Difficulty walking;Impaired tone;Decreased endurance;Decreased activity tolerance;Decreased balance;Decreased mobility;Decreased strength;Impaired flexibility;Decreased knowledge of use of DME;Increased muscle spasms  PT Treatment/Interventions ADLs/Self Care Home Management;DME Instruction;Therapeutic activities;Therapeutic exercise;Balance training;Neuromuscular re-education;Gait training;Stair training;Functional mobility training;Patient/family education;Manual techniques;Electrical Stimulation  PT Next Visit Plan Check LTGS   Consulted and Agree with Plan of Care Patient        Patient will benefit from skilled therapeutic intervention in order to improve the following deficits and impairments:  Abnormal gait, Difficulty walking, Impaired tone, Decreased endurance, Decreased activity tolerance, Decreased balance, Decreased mobility, Decreased strength, Impaired flexibility, Decreased knowledge of use of DME, Increased muscle spasms  Visit Diagnosis: Muscle weakness (generalized)  Other abnormalities of gait and  mobility  Other symptoms and signs involving the nervous system     Problem List Patient Active Problem List   Diagnosis Date Noted  . GERD (gastroesophageal reflux disease) 10/23/2016  . Prostate cancer screening 10/23/2016  . Obesity 06/30/2016  . Influenza 05/27/2016  . Right heart failure ([place]) 04/30/2016  . Coronary artery calcification 04/30/2016  . Palpitations 04/14/2016  . Hyperlipidemia 01/16/2016  . Prediabetes 09/13/2015  . Gout 08/10/2015  . History of hypertension 08/10/2015  . Candidal intertrigo 08/10/2015  . Chronic pain syndrome 07/11/2015  . History of pulmonary embolus (PE) 07/23/2012  . Hematuria 06/28/2012  . Cardiac arrest ([place]) 06/23/2012  . Transverse myelitis ([place]) 04/21/2012  . Closed right ankle fracture 04/19/2012  . Chronic back pain     Willow Ora, PTA, [facility] [address], [place], [place] [address] [phone] 11/20/16, 6:03 PM   Name: Matthew Juarez MRN: [id number] Date of Birth: [birth year]

## 2016-11-24 ENCOUNTER — Ambulatory Visit: Payer: Medicare Other | Admitting: Rehabilitative and Restorative Service Providers"

## 2016-11-24 DIAGNOSIS — M6281 Muscle weakness (generalized): Secondary | ICD-10-CM | POA: Diagnosis not present

## 2016-11-24 DIAGNOSIS — R29818 Other symptoms and signs involving the nervous system: Secondary | ICD-10-CM

## 2016-11-24 DIAGNOSIS — R2689 Other abnormalities of gait and mobility: Secondary | ICD-10-CM | POA: Diagnosis not present

## 2016-11-24 NOTE — Therapy (Signed)
[facility] [address] [place], [place], [address] Phone: [phone]   Fax:  [phone]  Physical Therapy Treatment  Patient Details  Name: Matthew Juarez MRN: [id number] Date of Birth: 1960/04/13 Referring Provider: Olena Heckle, MD  Encounter Date: 11/24/2016      PT End of Session - 11/24/16 1021    Visit Number 13   Number of Visits 17   Date for PT Re-Evaluation 11/29/16   Authorization Type G code every 10th visit   PT Start Time 1018   PT Stop Time 1100   PT Time Calculation (min) 42 min   Equipment Utilized During Treatment Gait belt   Activity Tolerance Patient tolerated treatment well   Behavior During Therapy [facility] for tasks assessed/performed      Past Medical History:  Diagnosis Date  . Arthritis   . Chronic back pain   . Gout   . Hammer toe   . Headache(784.0)   . History of blood clots   . Hypertension   . Pulmonary embolism ([facility])   . Transverse myelitis [facility])     Past Surgical History:  Procedure Laterality Date  . HAMMER TOE SURGERY     2016    There were no vitals filed for this visit.      Subjective Assessment - 11/24/16 1020    Subjective The patient reports that he walked in community over the weekend without knee buckling.  He is using RW more at home.   Patient Stated Goals "I want to get stronger and use the forearm crutches again".  He notes being able to go to the grocery store or barber shop without w/c would be a long term goal.     Currently in Pain? No/denies                         [facility] Adult PT Treatment/Exercise - 11/24/16 1038      Ambulation/Gait   Ambulation/Gait Yes   Ambulation/Gait Assistance 5: Supervision   Ambulation/Gait Assistance Details Patient walked into clinic today with RW with no loss of balance of evidence of R knee buckling.  PT provided supervision level care for short distances and CGA for distances > 120 ft.    Ambulation Distance (Feet) 230 Feet  x 2 reps, 100 ft x 3 reps   Assistive device Rolling walker   Gait Pattern Trunk flexed;Decreased trunk rotation;Decreased step length - right;Decreased step length - left   Ambulation Surface Level;Indoor   Gait Comments Wiith fatigue, note dec'd foot clearance R LE, however patient able to focus on lifting toes with improved clearance.     Exercises   Exercises Other Exercises   Other Exercises  Reviewed prior HEP including:  clam shells sidelying, glut sets prone, seated knee flexion with yellow theraband, seated ankle eversion.  Also added prone hip extension x 5 reps (patient unable to lift leg, but can elicit contraction), PT performed assisted isolated knee flexion prone x 5 reps each side (assist only needed on right), passive overpressure bilateral quad stretch prone.  STANDING:  sidestepping x 8 feet x 4 reps, mini squats x 10 reps with UE support, heel raises x 10 with UE support.  Sit<>stande x 10 reps with on eUE support.                PT Education - 11/24/16 1058    Education provided Yes   Education Details Reviewed and updated HEP  Person(s) Educated Patient   Methods Explanation;Demonstration;Handout   Comprehension Verbalized understanding          PT Short Term Goals - 10/30/16 0957      PT SHORT TERM GOAL #1   Title The patient will be indep with HEP for LE strengthening.  TARGET DATE FOR STGs:  10/30/2016   Baseline per subjective report   Time 4   Period Weeks   Status Achieved     PT SHORT TERM GOAL #2   Title The patient will maintain standing x 5 minutes with intermittent UE support in order to perform ADLs from standing position for increased weight bearing.   Baseline Met on 10/20/16   Time 4   Period Weeks   Status Achieved     PT SHORT TERM GOAL #3   Title The patient will ambulate x 150 ft nonstop with RW and supervision for increased household mobility.   Baseline Met on 10/20/16 walking 230 ft  nonstop.   Time 4   Period Weeks   Status Achieved     PT SHORT TERM GOAL #4   Title The patient will ambulate with forearm crutches x 30 ft with min A.   Baseline Ambulated 115 ft with min A + bilateral forearm crutches   Time 4   Period Weeks   Status Achieved     PT SHORT TERM GOAL #5   Title Assess gait speed and write LTG.   Baseline Patient ambulates 1.59 ft/sec with rolling walker.   Time 4   Period Weeks   Status Achieved           PT Long Term Goals - 11/24/16 1025      PT LONG TERM GOAL #1   Title The patient will be indep with post d/c HEP.  TARGET DATE ALL LTGS:  11/29/2016   Time 8   Period Weeks     PT LONG TERM GOAL #2   Title The patient will improve functional status score from 53% to > or equal to 62% to demo improved self perception of mobility.   Time 8   Period Weeks     PT LONG TERM GOAL #3   Title The patient will ambulate for short community distances with least restrictive device x 200 ft in order to walk into restaurants, MD appointments, barber shop, etc without using power chair.   Baseline Patient performed safe ambulation with RW into barber shop and into niece's home over weekend.   Time 8   Period Weeks   Status Achieved     PT LONG TERM GOAL #4   Title The patient will improve functional LE strength by demonstrating 10 x sit<>stand with one UE support.     Baseline Patient demonstrated with one UE.   Time 8   Period Weeks   Status Achieved     PT LONG TERM GOAL #5   Title The patient will have LTG for gait speed once assessed.   Baseline 1.74 ft/sec assessed-- further PT will not increase to "full community status.  CURRENT status if "limited community ambulator".   Time 8   Period Weeks   Status Achieved               Plan - 11/24/16 1239    Clinical Impression Statement The patient had improved R knee control today.  We discussed need for AFO and patient prefers to reassess after working on home walking and short  community negotiation x weeks.  Therefore, PT plans to place on hold next session, and schedule for a 6 week f/u to determine how walking is going without AFO.     PT Treatment/Interventions ADLs/Self Care Home Management;DME Instruction;Therapeutic activities;Therapeutic exercise;Balance training;Neuromuscular re-education;Gait training;Stair training;Functional mobility training;Patient/family education;Manual techniques;Electrical Stimulation   PT Next Visit Plan Check LTGS; place on hold, schedule in 6 weeks   Consulted and Agree with Plan of Care Patient      Patient will benefit from skilled therapeutic intervention in order to improve the following deficits and impairments:  Abnormal gait, Difficulty walking, Impaired tone, Decreased endurance, Decreased activity tolerance, Decreased balance, Decreased mobility, Decreased strength, Impaired flexibility, Decreased knowledge of use of DME, Increased muscle spasms  Visit Diagnosis: Muscle weakness (generalized)  Other abnormalities of gait and mobility  Other symptoms and signs involving the nervous system     Problem List Patient Active Problem List   Diagnosis Date Noted  . GERD (gastroesophageal reflux disease) 10/23/2016  . Prostate cancer screening 10/23/2016  . Obesity 06/30/2016  . Influenza 05/27/2016  . Right heart failure ([place]) 04/30/2016  . Coronary artery calcification 04/30/2016  . Palpitations 04/14/2016  . Hyperlipidemia 01/16/2016  . Prediabetes 09/13/2015  . Gout 08/10/2015  . History of hypertension 08/10/2015  . Candidal intertrigo 08/10/2015  . Chronic pain syndrome 07/11/2015  . History of pulmonary embolus (PE) 07/23/2012  . Hematuria 06/28/2012  . Cardiac arrest ([place]) 06/23/2012  . Transverse myelitis ([place]) 04/21/2012  . Closed right ankle fracture 04/19/2012  . Chronic back pain     Sai Moura, PT 11/24/2016, 12:41 PM  [facility] [address] [place], [place], [address] Phone: [phone]   Fax:  [phone]  Name: Berish Bohman MRN: [id number] Date of Birth: 1961-03-16

## 2016-11-24 NOTE — Patient Instructions (Signed)
  Clam Shell 45 Degrees    Lying with hips and knees bent 45, one pillow between knees and ankles. Lift knee. Be sure pelvis does not roll backward. Do not arch back. Do _10__ times, rest and repeat. _1__ times per day.  http://ss.exer.us/74  Copyright  VHI. All rights reserved.  Gluteal Sets    Tighten buttocks while pressing pelvis to floor. Hold __5__ seconds. Repeat _10___ times per set. Do __1-2__ sessions per day.  http://orth.exer.us/104  Copyright  VHI. All rights reserved.  THEN TRY TO LIFT ONE LEG 5 TIMES Hip Extension    Lying face down, raise leg just off floor. Keep leg straight. Hold 1 count. Lower leg to floor. Repeat __5__ times each leg.   Copyright  VHI. All rights reserved.    Hamstring Curl: Resisted (Sitting)    Facing anchor with tubing on right ankle, leg straight out, bend knee. Repeat __5__ times, rest and then repeat. Do _1-2___ sessions per day.   http://orth.exer.us/668  Copyright  VHI. All rights reserved. ANKLE: Eversion, Unilateral    SQUEEZE YOUR FIST BETWEEN YOUR KNEES (to keep hip from doing the work). Sit at edge of surface, feet on floor. Raise toes of foot up and move away from body. Do not move hip or knee. _5__ reps, rest and repeat. 1-2 times/day.  Copyright  VHI. All rights reserved.  Functional Quadriceps: Sit to Stand    Sit on edge of chair, feet flat on floor. Stand upright, extending knees fully. HAVE YOUR WALKER IN FRONT OF YOU.  1) Think about squeezing bottom muscles to get to upright position. 2) You can use your hands as needed.  Repeat __10__ times per set. Do __1__ sets per session. Do __1-2__ sessions per day.  http://orth.exer.us/734  Copyright  VHI. All rights reserved.  Hip Flexor Stretch    Lying on back near edge of bed, keep leg on bed flat or bend at knee. Hang other leg over edge, relaxed, thigh resting entirely on bed for _30 sec - . Repeat _2___ times. Do  _2-3___ sessions per day.      Electronically signed by Berneice Heinrich, PT at 11/12/2016 10:56 AM    Hamstring Stretch, Seated (Strap, Two Chairs)    Sit with one leg extended onto facing chair. Loop strap over outstretched foot at ball of big toe. Lengthen spine. Hold for 20 seconds. Repeat __2__ times each leg.  Copyright  VHI. All rights reserved.      Electronically signed by Berneice Heinrich, PT at 11/17/2016 11:01 AM

## 2016-11-27 ENCOUNTER — Ambulatory Visit: Payer: Medicare Other | Admitting: Rehabilitative and Restorative Service Providers"

## 2016-11-27 DIAGNOSIS — R29818 Other symptoms and signs involving the nervous system: Secondary | ICD-10-CM

## 2016-11-27 DIAGNOSIS — M6281 Muscle weakness (generalized): Secondary | ICD-10-CM | POA: Diagnosis not present

## 2016-11-27 DIAGNOSIS — R2689 Other abnormalities of gait and mobility: Secondary | ICD-10-CM

## 2016-11-27 NOTE — Therapy (Signed)
[place] [address] [place], [place], [address] Phone: [phone]   Fax:  [phone]  Physical Therapy Treatment  Patient Details  Name: Matthew Juarez MRN: [id number] Date of Birth: September 15, 1960 Referring Provider: Reinaldo Berber, MD  Encounter Date: 11/27/2016      PT End of Session - 11/27/16 0944    Visit Number 14   Number of Visits 17   Date for PT Re-Evaluation 11/29/16   Authorization Type G code every 10th visit   PT Start Time 1022   PT Stop Time 1102   PT Time Calculation (min) 40 min   Equipment Utilized During Treatment Gait belt   Activity Tolerance Patient tolerated treatment well   Behavior During Therapy [facility] for tasks assessed/performed      Past Medical History:  Diagnosis Date  . Arthritis   . Chronic back pain   . Gout   . Hammer toe   . Headache(784.0)   . History of blood clots   . Hypertension   . Pulmonary embolism ([place])   . Transverse myelitis [facility])     Past Surgical History:  Procedure Laterality Date  . HAMMER TOE SURGERY     2016    There were no vitals filed for this visit.      Subjective Assessment - 11/27/16 0941    Subjective The patient was able to walk into his dentist office for appointment.  He notes no weakness in the right leg or foot dragging for short distances.  He is using RW more and more for walking into restaurants and appointments.   The patient is walking more around the home.   Patient Stated Goals "I want to get stronger and use the forearm crutches again".  He notes being able to go to the grocery store or barber shop without w/c would be a long term goal.                           [facility] Adult PT Treatment/Exercise - 11/27/16 0953      Ambulation/Gait   Ambulation/Gait Yes   Ambulation/Gait Assistance 6: Modified independent (Device/Increase time)   Ambulation Distance (Feet) 230 Feet   Assistive device Rolling walker    Ambulation Surface Level;Indoor   Gait velocity 1.85 ft/sec   Gait Comments After walking 150+ ft, the patient had a couple of episodes of foot slap on the right side.  He slowed pace and was able to improve foot/ankle control.       Self-Care   Self-Care Other Self-Care Comments   Other Self-Care Comments  PT and the patient discussed current status and how patinet is not negotiating the homes of family members (able to go to niece, nephew's, mother's and sister in [place]).  He is negotiating steps to enter with supervision from his wife.  He is walking with RW more in home and for limited community distances.  PT recommends continued performance of HEP.                  PT Education - 11/27/16 1003    Education provided Yes   Education Details on hold with patient to continue progressing gait in home/limited community and continue HEP   Person(s) Educated Patient   Methods Explanation;Demonstration   Comprehension Verbalized understanding          PT Short Term Goals - 10/30/16 0957      PT SHORT TERM  GOAL #1   Title The patient will be indep with HEP for LE strengthening.  TARGET DATE FOR STGs:  10/30/2016   Baseline per subjective report   Time 4   Period Weeks   Status Achieved     PT SHORT TERM GOAL #2   Title The patient will maintain standing x 5 minutes with intermittent UE support in order to perform ADLs from standing position for increased weight bearing.   Baseline Met on 10/20/16   Time 4   Period Weeks   Status Achieved     PT SHORT TERM GOAL #3   Title The patient will ambulate x 150 ft nonstop with RW and supervision for increased household mobility.   Baseline Met on 10/20/16 walking 230 ft nonstop.   Time 4   Period Weeks   Status Achieved     PT SHORT TERM GOAL #4   Title The patient will ambulate with forearm crutches x 30 ft with min A.   Baseline Ambulated 115 ft with min A + bilateral forearm crutches   Time 4   Period Weeks    Status Achieved     PT SHORT TERM GOAL #5   Title Assess gait speed and write LTG.   Baseline Patient ambulates 1.59 ft/sec with rolling walker.   Time 4   Period Weeks   Status Achieved           PT Long Term Goals - 11/27/16 0946      PT LONG TERM GOAL #1   Title The patient will be indep with post d/c HEP.  TARGET DATE ALL LTGS:  11/29/2016   Time 8   Period Weeks   Status Achieved     PT LONG TERM GOAL #2   Title The patient will improve functional status score from 53% to > or equal to 62% to demo improved self perception of mobility.   Baseline IMproved from 53% up to 61% today.   Time 8   Period Weeks   Status Partially Met     PT LONG TERM GOAL #3   Title The patient will ambulate for short community distances with least restrictive device x 200 ft in order to walk into restaurants, MD appointments, barber shop, etc without using power chair.   Baseline Patient performed safe ambulation with RW into barber shop and into niece's home over weekend.   Time 8   Period Weeks   Status Achieved     PT LONG TERM GOAL #4   Title The patient will improve functional LE strength by demonstrating 10 x sit<>stand with one UE support.     Baseline Patient demonstrated with one UE.   Time 8   Period Weeks   Status Achieved     PT LONG TERM GOAL #5   Title The patient will have LTG for gait speed once assessed.*improved from 1.74 ft/sec up to 1.85 ft/sec on 11/27/16   Baseline 1.74 ft/sec assessed-- further PT will not increase to "full community status.  CURRENT status if "limited community ambulator".   Time 8   Period Weeks   Status Achieved               Plan - 11/27/16 1001    Clinical Impression Statement The patient is walking short community distances with RW without AFO.  PT and patient have agreed to check in for one f/u visit in 6 weeks in order to determine if R LE weakness at knee and ankle  are limiting progression of further walking.  If he feels R foot  drag is hindering walking, then we will consider getting consult/prescription for AFO.  He has KAFO from 2015, however does not wear due to weight of brace.    Clinical Impairments Affecting Rehab Potential nature of disease progression,  caution with intensity of ther ex as patient has had exacerbation of symptoms in past during episode of PT (this was early in dx and he appears more stable in clinical presentation at this time).   PT Treatment/Interventions ADLs/Self Care Home Management;DME Instruction;Therapeutic activities;Therapeutic exercise;Balance training;Neuromuscular re-education;Gait training;Stair training;Functional mobility training;Patient/family education;Manual techniques;Electrical Stimulation   PT Next Visit Plan Do a G code and renewal + write LTG for next visit; f/u in 6 weeks to determine how walking progression going and if right AFO needed.   Consulted and Agree with Plan of Care Patient      Patient will benefit from skilled therapeutic intervention in order to improve the following deficits and impairments:  Abnormal gait, Difficulty walking, Impaired tone, Decreased endurance, Decreased activity tolerance, Decreased balance, Decreased mobility, Decreased strength, Impaired flexibility, Decreased knowledge of use of DME, Increased muscle spasms  Visit Diagnosis: Muscle weakness (generalized)  Other abnormalities of gait and mobility  Other symptoms and signs involving the nervous system       G-Codes - December 08, 2016 1004    Functional Assessment Tool Used (Outpatient Only) Gait with RW in home mod indep on level surfaces.   Functional Limitation Mobility: Walking and moving around   Mobility: Walking and Moving Around Goal Status [phone]) At least 20 percent but less than 40 percent impaired, limited or restricted   Mobility: Walking and Moving Around Discharge Status [phone]) At least 20 percent but less than 40 percent impaired, limited or restricted      Problem  List Patient Active Problem List   Diagnosis Date Noted  . GERD (gastroesophageal reflux disease) 10/23/2016  . Prostate cancer screening 10/23/2016  . Obesity 06/30/2016  . Influenza 05/27/2016  . Right heart failure ([place]) 04/30/2016  . Coronary artery calcification 04/30/2016  . Palpitations 04/14/2016  . Hyperlipidemia 01/16/2016  . Prediabetes 09/13/2015  . Gout 08/10/2015  . History of hypertension 08/10/2015  . Candidal intertrigo 08/10/2015  . Chronic pain syndrome 07/11/2015  . History of pulmonary embolus (PE) 07/23/2012  . Hematuria 06/28/2012  . Cardiac arrest ([place]) 06/23/2012  . Transverse myelitis ([place]) 04/21/2012  . Closed right ankle fracture 04/19/2012  . Chronic back pain     Musa Rewerts, PT 2016/12/08, 10:10 AM  [place] [address] [place], [place], [address] Phone: [phone]   Fax:  [phone]  Name: Casimer Russett MRN: [id number] Date of Birth: November 13, 1960

## 2016-12-02 DIAGNOSIS — G360 Neuromyelitis optica [Devic]: Secondary | ICD-10-CM | POA: Diagnosis not present

## 2016-12-02 DIAGNOSIS — R531 Weakness: Secondary | ICD-10-CM | POA: Diagnosis not present

## 2016-12-02 DIAGNOSIS — Z79891 Long term (current) use of opiate analgesic: Secondary | ICD-10-CM | POA: Diagnosis not present

## 2016-12-02 DIAGNOSIS — Z79899 Other long term (current) drug therapy: Secondary | ICD-10-CM | POA: Diagnosis not present

## 2016-12-02 DIAGNOSIS — G894 Chronic pain syndrome: Secondary | ICD-10-CM | POA: Diagnosis not present

## 2016-12-02 DIAGNOSIS — Z7722 Contact with and (suspected) exposure to environmental tobacco smoke (acute) (chronic): Secondary | ICD-10-CM | POA: Diagnosis not present

## 2016-12-02 DIAGNOSIS — M25511 Pain in right shoulder: Secondary | ICD-10-CM | POA: Diagnosis not present

## 2016-12-02 DIAGNOSIS — M25512 Pain in left shoulder: Secondary | ICD-10-CM | POA: Diagnosis not present

## 2016-12-24 ENCOUNTER — Ambulatory Visit (INDEPENDENT_AMBULATORY_CARE_PROVIDER_SITE_OTHER): Payer: Medicare Other | Admitting: Internal Medicine

## 2016-12-24 ENCOUNTER — Encounter: Payer: Self-pay | Admitting: Internal Medicine

## 2016-12-24 VITALS — BP 122/62 | HR 59 | Ht 77.0 in | Wt 308.0 lb

## 2016-12-24 DIAGNOSIS — R002 Palpitations: Secondary | ICD-10-CM | POA: Diagnosis not present

## 2016-12-24 DIAGNOSIS — Z86711 Personal history of pulmonary embolism: Secondary | ICD-10-CM

## 2016-12-24 NOTE — Progress Notes (Signed)
Follow-up Outpatient Visit Date: 12/24/2016  Primary Care Provider: Glori Luis, MD [address] [place] [address]  Chief Complaint: Follow-up palpitations  HPI:  Matthew Juarez is a 56 y.o. year-old male with history of hyperlipidemia, prediabetes, transverse myelitis, and cardiac arrest secondary to pulmonary embolism in 05/2012, who presents for follow-up of palpitations. I last saw him in late March, at which time he was doing well without recurrent palpitations. Preceding event monitor had shown PACs, PVCs, and brief runs of SVT. Since our last visit, Matthew Juarez has done well without palpitations, chest pain, shortness of breath, and lightheadedness. He has stable bilateral leg edema that is controlled with compression stockings. He remains on rivaroxaban for his history of massive PE, which he is tolerating well.  No palpitations, CP, SOB, edema or lightheadedness.  --------------------------------------------------------------------------------------------------  Cardiovascular History & Procedures: Cardiovascular Problems:  Palpitations  Massive pulmonary embolism (05/2012)  Risk Factors:  Hyperlipidemia, male gender, and age > 79  Cath/PCI:  None  CV Surgery:  None  EP Procedures and Devices:  14-day event monitor (04/30/16): Normal sinus rhythm with rare supraventricular and ventricular ectopy. Single episode of brief SVT lasting 7 beats, which did not correspond patient reported symptoms.  Non-Invasive Evaluation(s):  TTE (06/20/16): Normal LV size and function (EF 60-65%) with normal wall motion and grade 1 diastolic dysfunction. Question possible mild enlargement of the right ventricle, suboptimally imaged. Unable to assess PA pressure. No significant valvular abnormalities.  TTE (06/23/12): Small LV cavity with normal wall thickness. LVEF 65-70%. Severely dilated RV with severe dysfunction. Severe RA enlargement. Mild tricuspid  regurgitation. Elevated central venous pressure.  Recent CV Pertinent Labs: Lab Results  Component Value Date   CHOL 116 10/23/2016   HDL 40.40 10/23/2016   LDLCALC 58 10/23/2016   LDLDIRECT 73.0 01/16/2016   TRIG 86.0 10/23/2016   CHOLHDL 3 10/23/2016   INR 2.00 (H) 07/06/2012   K 4.7 10/23/2016   K 4.0 04/12/2012   MG 1.6 06/24/2012   BUN 14 10/23/2016   BUN 14 04/12/2012   CREATININE 0.93 10/23/2016   CREATININE 0.99 04/12/2012    Past medical and surgical history were reviewed and updated in EPIC.  Current Meds  Medication Sig  . acetaminophen (TYLENOL) 500 MG tablet Take 500 mg by mouth every 6 (six) hours as needed for mild pain.  Marland Kitchen allopurinol (ZYLOPRIM) 300 MG tablet TAKE 1 TABLET (300 MG TOTAL) BY MOUTH DAILY.  Marland Kitchen atorvastatin (LIPITOR) 40 MG tablet TAKE 1 TABLET (40 MG TOTAL) BY MOUTH DAILY.  Marland Kitchen Cholecalciferol (VITAMIN D) 2000 UNITS CAPS Take 2,000 Units by mouth daily.  . Coenzyme Q10 (CO Q-10) 200 MG CAPS Take 1 capsule by mouth daily.  Marland Kitchen esomeprazole (NEXIUM) 20 MG capsule Take 1 capsule (20 mg total) by mouth daily before breakfast.  . gabapentin (NEURONTIN) 600 MG tablet Take 600 mg by mouth 2 (two) times daily.  Marland Kitchen ketoconazole (NIZORAL) 2 % cream Apply 1 application topically daily.  . Oxycodone HCl 10 MG TABS Take by mouth.  . rivaroxaban (XARELTO) 20 MG TABS tablet Take 1 tablet (20 mg total) by mouth daily with supper.    Allergies: Patient has no known allergies.  Social History   Social History  . Marital status: Married    Spouse name: N/A  . Number of children: N/A  . Years of education: N/A   Occupational History  . disability    Social History Main Topics  . Smoking status: Former Smoker  Years: 1.00    Types: Cigarettes    Quit date: 04/01/1983  . Smokeless tobacco: Never Used     Comment: only snmoke 2- cigsper day when he smoked  . Alcohol use No  . Drug use: No  . Sexual activity: Not Currently   Other Topics Concern  . Not on  file   Social History Narrative  . No narrative on file    Family History  Problem Relation Age of Onset  . Dementia Mother   . Lung cancer Maternal Uncle   . Colon cancer Maternal Aunt   . Lung cancer Maternal Aunt   . Heart disease Neg Hx     Review of Systems: A 12-system review of systems was performed and was negative except as noted in the HPI.  --------------------------------------------------------------------------------------------------  Physical Exam: BP 122/62 (BP Location: Right Arm, Patient Position: Sitting, Cuff Size: Large)   Pulse (!) 59   Ht  (1.956 m)   Wt (!) 308 lb (139.7 kg)   BMI 36.52 kg/m   General:  Obese man, seated comfortably in wheelchair. HEENT: No conjunctival pallor or scleral icterus. Moist mucous membranes.  OP clear. Neck: Supple without lymphadenopathy, thyromegaly, JVD, or HJR. Lungs: Normal work of breathing. Clear to auscultation bilaterally without wheezes or crackles. Heart: Regular rate and rhythm without murmurs, rubs, or gallops.  Abd: Bowel sounds present. Soft, NT/ND. Ext: Trace calf edema bilaterally with compression stockings in place.  EKG:  Sinus bradycardia (HR 59 bpm) without significant abnormalities.  Lab Results  Component Value Date   WBC 4.2 04/14/2016   HGB 13.9 04/14/2016   HCT 41.6 04/14/2016   MCV 87.8 04/14/2016   PLT 169.0 04/14/2016    Lab Results  Component Value Date   NA 138 10/23/2016   K 4.7 10/23/2016   CL 103 10/23/2016   CO2 30 10/23/2016   BUN 14 10/23/2016   CREATININE 0.93 10/23/2016   GLUCOSE 85 10/23/2016   ALT 26 10/23/2016    Lab Results  Component Value Date   CHOL 116 10/23/2016   HDL 40.40 10/23/2016   LDLCALC 58 10/23/2016   LDLDIRECT 73.0 01/16/2016   TRIG 86.0 10/23/2016   CHOLHDL 3 10/23/2016    --------------------------------------------------------------------------------------------------  ASSESSMENT AND PLAN: Palpitations No recurrence. Monitor  previously showed PACs, PVCs, and brief SVT. Given lack of recurrent symptoms, we will defer adding any medications today.  History of massive pulmonary embolism Continue lifelong anticoagulation given limited mobility secondary to transverse myelitis  Follow-up: Return to clinic in 1 year.  Yvonne Kendall, MD 12/24/2016 11:50 AM

## 2016-12-24 NOTE — Patient Instructions (Signed)
Follow-Up: Your physician wants you to follow-up in: 1 year with Dr. End. You will receive a reminder letter in the mail two months in advance. If you don't receive a letter, please call our office to schedule the follow-up appointment.  It was a pleasure seeing you today here in the office. Please do not hesitate to give us a call back if you have any further questions. [phone]  Pamela A. RN, BSN      

## 2017-01-08 ENCOUNTER — Ambulatory Visit: Payer: Medicare Other | Admitting: Rehabilitative and Restorative Service Providers"

## 2017-01-16 ENCOUNTER — Ambulatory Visit: Payer: Medicare Other | Attending: Neurology | Admitting: Rehabilitative and Restorative Service Providers"

## 2017-01-16 ENCOUNTER — Telehealth: Payer: Self-pay | Admitting: Rehabilitative and Restorative Service Providers"

## 2017-01-16 DIAGNOSIS — R2689 Other abnormalities of gait and mobility: Secondary | ICD-10-CM | POA: Insufficient documentation

## 2017-01-16 DIAGNOSIS — M6281 Muscle weakness (generalized): Secondary | ICD-10-CM | POA: Diagnosis not present

## 2017-01-16 DIAGNOSIS — R29818 Other symptoms and signs involving the nervous system: Secondary | ICD-10-CM | POA: Diagnosis not present

## 2017-01-16 DIAGNOSIS — M21371 Foot drop, right foot: Secondary | ICD-10-CM

## 2017-01-16 NOTE — Telephone Encounter (Signed)
Dr. Birdie SonsSonnenberg, I evaluated Mr. Alto DenverHunt in August of 2018 for PT (have seen him in the past after onset of transverse myelitis).  [facility] him this time (historically, it takes me a long time to get [facility]).  He has progressed to using his RW for household mobility and short community distances.   When fatigued, he develops worsening R foot drop and this limits longer ambulation.  He was not sure about using an AFO (he had a KAFO years ago that he doesn't use).  I recommended he work on his walking without the AFO x 6 weeks and then return to see me and we would have more information to make this decision.  He does feel limited by R foot drop during gait activities.  He thinks this decreases the distance he can ambulate.  We tried an off the shelf ankle foot orthoses today and I think he will benefit from use to prolong ambulation for community mobility.  If you agree, can you place an order in epic for R foot ankle foot orthoses?  Thank you! Brentlee Sciara, PT

## 2017-01-16 NOTE — Telephone Encounter (Signed)
Hi Matthew Juarez,   I am happy to place an order for this, though I am unable to find it in epic. Could you guide me to the correct order and I can place it? Thanks.  Marikay AlarEric Cordie Beazley

## 2017-01-16 NOTE — Therapy (Signed)
[facility] [address] [place], [place], [address] Phone: [phone]   Fax:  [phone]  Physical Therapy Treatment  Patient Details  Name: Matthew Juarez MRN: [id number] Date of Birth: [birth year] Referring Provider: Olena Heckle, MD  Encounter Date: 01/16/2017      PT End of Session - 01/16/17 1114    Visit Number 15   Number of Visits 17   Date for PT Re-Evaluation 11/29/16   Authorization Type G code every 10th visit   PT Start Time 1111   PT Stop Time 1142   PT Time Calculation (min) 31 min   Equipment Utilized During Treatment --   Activity Tolerance Patient tolerated treatment well   Behavior During Therapy [facility] for tasks assessed/performed      Past Medical History:  Diagnosis Date  . Arthritis   . Chronic back pain   . Gout   . Hammer toe   . Headache(784.0)   . History of blood clots   . Hypertension   . Pulmonary embolism ([facility])   . Transverse myelitis [facility])     Past Surgical History:  Procedure Laterality Date  . HAMMER TOE SURGERY     2016    There were no vitals filed for this visit.      Subjective Assessment - 01/16/17 1113    Subjective The patient reports that he is walking more at home and in the community for limited distances.     Patient Stated Goals "I want to get stronger and use the forearm crutches again".  He notes being able to go to the grocery store or barber shop without w/c would be a long term goal.                           [facility] Adult PT Treatment/Exercise - 01/16/17 1116      Ambulation/Gait   Ambulation/Gait Yes   Ambulation/Gait Assistance 6: Modified independent (Device/Increase time)   Ambulation/Gait Assistance Details Walked into clinic with RW modified indep without AFO donned.  PT and patient discussed his community walking over the past 6 weeks.  He feels that fatigue in the right leg hinders longer distances for walking and  he does want to pursue the use of an AFO for the right foot.     Gait Comments TRIALED:  Walk on brace, however this creates pain medially at the right ankle due to overpronation.  Blue rocker x 230 ft  x 2 reps.  Patient still fatigued/caught foot 1x, but able to self recover.  Also may want to consider an anterior loading townsend brace and/or a GRAFO with an articulating ankle to help with knee control and foot clearance.               PT Long Term Goals - 01/16/17 1159      PT LONG TERM GOAL #1   Title PT to address right AFO needs for progression of community gait activities.   Time 4   Period Weeks   Status New   Target Date 03/17/17     PT LONG TERM GOAL #2   Title The patient will ambulate x 450 feet nonstop with RW with right AFO donned modified indep.   Time 4   Period Weeks   Status New   Target Date 03/17/17               Plan - 01/16/17 1138  Clinical Impression Statement The patient returned to therapy today after working on walking short community distances x the past 6 weeks.  PT tried walk on brace--hurt medial aspect of foot due to overpronation.  Tried Blue rocker brace.  This improved foot clearance, however patient does overpronate at ankle, so it does not control medial/lateral stability of the right ankle.  May want to consider a custom AFO that blocks anterior buckling, allows articulation at the ankle, and prevents overpronation (?possibly articulating GRAFO?).  PT to request MD order, set up visit with PT + orthotist to consult, and then will see patient for 4-6 visits in order to ensure progression of community gait and ability to negotiate stairs with new brace.    PT Frequency 2x / week   PT Duration 4 weeks  over 60 days as we will be awaiting order, and authorization for brace initially.   PT Treatment/Interventions ADLs/Self Care Home Management;DME Instruction;Therapeutic activities;Therapeutic exercise;Balance training;Neuromuscular  re-education;Gait training;Stair training;Functional mobility training;Patient/family education;Manual techniques;Electrical Stimulation   PT Next Visit Plan Get MD order for ankle/foot orthotic:  blue rocker, townsend, or GRAFO with articulating ankle design?    Consulted and Agree with Plan of Care Patient      Patient will benefit from skilled therapeutic intervention in order to improve the following deficits and impairments:  Abnormal gait, Difficulty walking, Impaired tone, Decreased endurance, Decreased activity tolerance, Decreased balance, Decreased mobility, Decreased strength, Impaired flexibility, Decreased knowledge of use of DME, Increased muscle spasms  Visit Diagnosis: Muscle weakness (generalized)  Other abnormalities of gait and mobility  Other symptoms and signs involving the nervous system       G-Codes - 01/16/17 1203    Functional Assessment Tool Used (Outpatient Only) Walking short distances in community modified indep x 230 ft.-- PT to obtain R AFO to improve distnace.   Functional Limitation Self care   Self Care Current Status [phone]([id number]) At least 20 percent but less than 40 percent impaired, limited or restricted   Self Care Goal Status ([id number]) At least 1 percent but less than 20 percent impaired, limited or restricted      Problem List Patient Active Problem List   Diagnosis Date Noted  . GERD (gastroesophageal reflux disease) 10/23/2016  . Prostate cancer screening 10/23/2016  . Obesity 06/30/2016  . Influenza 05/27/2016  . Right heart failure ([facility]) 04/30/2016  . Coronary artery calcification 04/30/2016  . Palpitations 04/14/2016  . Hyperlipidemia 01/16/2016  . Prediabetes 09/13/2015  . Gout 08/10/2015  . History of hypertension 08/10/2015  . Candidal intertrigo 08/10/2015  . Chronic pain syndrome 07/11/2015  . History of pulmonary embolism 07/23/2012  . Hematuria 06/28/2012  . Cardiac arrest ([facility]) 06/23/2012  . Transverse myelitis ([facility])  04/21/2012  . Closed right ankle fracture 04/19/2012  . Chronic back pain     Kilea Mccarey, PT 01/16/2017, 12:05 PM  [facility] [address] [place], [place], [address] Phone: [phone]   Fax:  [phone]  Name: Matthew Juarez MRN: [id number] Date of Birth: 06/15/1960

## 2017-01-19 ENCOUNTER — Other Ambulatory Visit: Payer: Self-pay | Admitting: Family Medicine

## 2017-01-19 NOTE — Telephone Encounter (Signed)
Dr. Birdie SonsSonnenberg,  Some MDs put them in "Referrals" as "ambulatory referral for orthotics" and put in comments section what is requested. (For Mr. Alto DenverHunt it would be R AFO).  I have seen order under "other orders" for orthotics as well.   Once the order is placed, I can print it out and f/u with an orthotics company.  I will check with Mr. Alto DenverHunt to choose company because he worked with an orthotist years ago (when first experiencing weakness) and may want to return to that company.  Let me know if there are any other questions. Thanks! Trula Orehristina

## 2017-01-19 NOTE — Telephone Encounter (Signed)
Thank you for your help. I put in an ambulatory referral for orthotics with right AFO in the comment section. Please let me know if you need anything else. Romeo Zielinski.

## 2017-01-26 ENCOUNTER — Telehealth: Payer: Self-pay | Admitting: Rehabilitative and Restorative Service Providers"

## 2017-01-26 NOTE — Telephone Encounter (Addendum)
Called [employer] to ensure this was company Mr. Alto DenverHunt has worked with in past and they verified prior brace in 2015.  Faxed order for R AFO and sent patient contact information for appointment purposes.  Plan to f/u with patient in PT once brace obtained.  Neftali Thurow, PT  F/u phone call 03/03/17 to [employer]- they report patient has appt today to receive AFO.    Will call to schedule for f/u PT visit for any training needed with AFO. Timiko Offutt, PT

## 2017-01-29 DIAGNOSIS — G360 Neuromyelitis optica [Devic]: Secondary | ICD-10-CM | POA: Diagnosis not present

## 2017-01-29 DIAGNOSIS — M6281 Muscle weakness (generalized): Secondary | ICD-10-CM | POA: Diagnosis not present

## 2017-01-29 DIAGNOSIS — R269 Unspecified abnormalities of gait and mobility: Secondary | ICD-10-CM | POA: Diagnosis not present

## 2017-01-29 DIAGNOSIS — Z79899 Other long term (current) drug therapy: Secondary | ICD-10-CM | POA: Diagnosis not present

## 2017-03-02 ENCOUNTER — Telehealth: Payer: Self-pay

## 2017-03-02 NOTE — Telephone Encounter (Signed)
Copied from CRM [id number]. Topic: Inquiry >> Mar 02, 2017 12:00 PM Yvonna Alanisobinson, Andra M wrote: Reason for CRM: Leavy CellaJasmine from [facility] [phone] - Called requesting progress notes supporting patient's need for an AFO Brace. Jasmine stated that they received the fax back, but not the progress notes. Please contact Jasmine ASAP at [phone]. Today. She needs the notes today (Monday 03/02/2017).

## 2017-03-02 NOTE — Telephone Encounter (Signed)
faxed

## 2017-03-19 DIAGNOSIS — M25512 Pain in left shoulder: Secondary | ICD-10-CM

## 2017-03-19 DIAGNOSIS — G8929 Other chronic pain: Secondary | ICD-10-CM | POA: Insufficient documentation

## 2017-03-20 ENCOUNTER — Ambulatory Visit: Payer: Medicare Other | Admitting: Rehabilitative and Restorative Service Providers"

## 2017-04-01 DIAGNOSIS — G360 Neuromyelitis optica [Devic]: Secondary | ICD-10-CM | POA: Diagnosis not present

## 2017-04-03 ENCOUNTER — Ambulatory Visit: Payer: Medicare Other | Admitting: Rehabilitative and Restorative Service Providers"

## 2017-04-06 ENCOUNTER — Ambulatory Visit: Payer: Medicare Other | Attending: Neurology | Admitting: Rehabilitative and Restorative Service Providers"

## 2017-04-06 ENCOUNTER — Encounter: Payer: Self-pay | Admitting: Rehabilitative and Restorative Service Providers"

## 2017-04-06 DIAGNOSIS — R29818 Other symptoms and signs involving the nervous system: Secondary | ICD-10-CM | POA: Diagnosis not present

## 2017-04-06 DIAGNOSIS — M6281 Muscle weakness (generalized): Secondary | ICD-10-CM | POA: Insufficient documentation

## 2017-04-06 DIAGNOSIS — R2689 Other abnormalities of gait and mobility: Secondary | ICD-10-CM | POA: Diagnosis not present

## 2017-04-06 NOTE — Therapy (Signed)
[facility] [address] [place], [place], [address] Phone: [phone]   Fax:  [phone]  Physical Therapy Treatment and Discharge Summary  Patient Details  Name: Matthew Juarez MRN: [id number] Date of Birth: 1960-10-15 Referring Provider: Reinaldo Berber, MD   Encounter Date: 04/06/2017  PT End of Session - 04/06/17 1106    Visit Number  16    Number of Visits  20    Date for PT Re-Evaluation  05/06/17    PT Start Time  1102    PT Stop Time  1145    PT Time Calculation (min)  43 min    Activity Tolerance  Patient tolerated treatment well    Behavior During Therapy  [facility] for tasks assessed/performed       Past Medical History:  Diagnosis Date  . Arthritis   . Chronic back pain   . Gout   . Hammer toe   . Headache(784.0)   . History of blood clots   . Hypertension   . Pulmonary embolism ([place])   . Transverse myelitis [facility])     Past Surgical History:  Procedure Laterality Date  . HAMMER TOE SURGERY     2016    There were no vitals filed for this visit.  Subjective Assessment - 04/06/17 1103    Subjective  The patient got R AFO and reports he is much more active with walking.  He wears it around the house and in the community.  Patient uses w/c in the morning to get his foster son up/dressed.  He is tolerating AFO wear x 6 hours per day without any skin issues.  He is using RW and AFO for short community distances [employer]) and uses w/c for grocery shopping or mall / longer distances.    Patient Stated Goals  "I want to get stronger and use the forearm crutches again".  He notes being able to go to the grocery store or barber shop without w/c would be a long term goal.      Currently in Pain?  No/denies                      [facility] Adult PT Treatment/Exercise - 04/06/17 1115      Ambulation/Gait   Ambulation/Gait  Yes    Ambulation/Gait Assistance  6: Modified  independent (Device/Increase time)    Ambulation/Gait Assistance Details  Walked into clinic without AFO- sister in law handed to PT.   Then donned AFO    Ambulation Distance (Feet)  450 Feet    Assistive device  Rolling walker    Ambulation Surface  Level;Indoor    Gait velocity  1.81 ft/sec    Stairs  Yes    Stairs Assistance  5: Supervision    Stair Management Technique  Two rails;Alternating pattern    Number of Stairs  16    Ramp  5: Supervision    Ramp Details (indicate cue type and reason)  Patient uses AFO and RW with supervision.     Gait Comments  The patient negotiates steps without any loss of balance.      Self-Care   Self-Care  Other Self-Care Comments    Other Self-Care Comments   worked on donning AFO and tried multiple strategies.  The AFO has a squared off heel and this may hinder ease of donning shoe.  PT to contact orthotist to determine if he could round it minimally to improve donning.  PT Short Term Goals - 10/30/16 0957      PT SHORT TERM GOAL #1   Title  The patient will be indep with HEP for LE strengthening.  TARGET DATE FOR STGs:  10/30/2016    Baseline  per subjective report    Time  4    Period  Weeks    Status  Achieved      PT SHORT TERM GOAL #2   Title  The patient will maintain standing x 5 minutes with intermittent UE support in order to perform ADLs from standing position for increased weight bearing.    Baseline  Met on 10/20/16    Time  4    Period  Weeks    Status  Achieved      PT SHORT TERM GOAL #3   Title  The patient will ambulate x 150 ft nonstop with RW and supervision for increased household mobility.    Baseline  Met on 10/20/16 walking 230 ft nonstop.    Time  4    Period  Weeks    Status  Achieved      PT SHORT TERM GOAL #4   Title  The patient will ambulate with forearm crutches x 30 ft with min A.    Baseline  Ambulated 115 ft with min A + bilateral forearm crutches    Time  4    Period  Weeks     Status  Achieved      PT SHORT TERM GOAL #5   Title  Assess gait speed and write LTG.    Baseline  Patient ambulates 1.59 ft/sec with rolling walker.    Time  4    Period  Weeks    Status  Achieved        PT Long Term Goals - 04/06/17 1130      PT LONG TERM GOAL #1   Title  PT to address right AFO needs for progression of community gait activities.    Time  4    Period  Weeks    Status  Achieved      PT LONG TERM GOAL #2   Title  The patient will ambulate x 450 feet nonstop with RW with right AFO donned modified indep.    Time  4    Period  Weeks    Status  Achieved            Plan - 04/06/17 1511    Clinical Impression Statement  The patient has met all STGs/ LTGs.  He returned today for PT after not being seen x months in order to allow time for him to get AFO and practice using before follow-up.  He had to cancel a couple of weeks ago due to transportation issues.  The patient arrived today and is able to walk further distances with AFO donned.  He is negotiating flights of steps with supervision, walking into restaurants, completing short community negotiation.  He is continuing to use w/c for longer distances.  PT offered to try forearm crutches today (in prior therapy, R knee weakness hindered) however patient wants to continue to work with RW and AFO while progressing ambulation distance.      Clinical Impairments Affecting Rehab Potential  nature of disease progression,  caution with intensity of ther ex as patient has had exacerbation of symptoms in past during episode of PT (this was early in dx and he appears more stable in clinical presentation at this time).  PT Frequency  -- certification for today's visit only    PT Treatment/Interventions  ADLs/Self Care Home Management;DME Instruction;Therapeutic activities;Therapeutic exercise;Balance training;Neuromuscular re-education;Gait training;Stair training;Functional mobility training;Patient/family education;Manual  techniques;Electrical Stimulation    PT Next Visit Plan  No further PT needed at this time.  The patient is ambulating limited community distances mod indep with RW and R AFO.  He is tolerating brace well.  PT discussed progressing distance/endurance with walking.    Consulted and Agree with Plan of Care  Patient       Patient will benefit from skilled therapeutic intervention in order to improve the following deficits and impairments:  Abnormal gait, Difficulty walking, Impaired tone, Decreased endurance, Decreased activity tolerance, Decreased balance, Decreased mobility, Decreased strength, Impaired flexibility, Decreased knowledge of use of DME, Increased muscle spasms  Visit Diagnosis: Muscle weakness (generalized)  Other abnormalities of gait and mobility  Other symptoms and signs involving the nervous system  PHYSICAL THERAPY DISCHARGE SUMMARY  Visits from Start of Care: 16  Current functional level related to goals / functional outcomes: See above   Remaining deficits: Chronic deficits with R LE weakness > L LE weakness Gait abnormality   Education / Equipment: Home program, use of AFO, donning brace.   Plan: Patient agrees to discharge.  Patient goals were met. Patient is being discharged due to meeting the stated rehab goals.  ?????        Thank you for the referral of this patient. Rudell Cobb, MPT   [place], PT 04/06/2017, 3:14 PM  [facility] [address] [place], [place], [address] Phone: [phone]   Fax:  [phone]  Name: Matthew Juarez MRN: [id number] Date of Birth: 03/14/1961

## 2017-04-18 ENCOUNTER — Other Ambulatory Visit: Payer: Self-pay | Admitting: Family Medicine

## 2017-04-27 ENCOUNTER — Ambulatory Visit (INDEPENDENT_AMBULATORY_CARE_PROVIDER_SITE_OTHER): Payer: Medicare Other | Admitting: Family Medicine

## 2017-04-27 ENCOUNTER — Other Ambulatory Visit: Payer: Self-pay

## 2017-04-27 ENCOUNTER — Encounter: Payer: Self-pay | Admitting: Family Medicine

## 2017-04-27 VITALS — BP 124/84 | HR 69 | Temp 97.9°F | Wt 323.8 lb

## 2017-04-27 DIAGNOSIS — R7303 Prediabetes: Secondary | ICD-10-CM | POA: Diagnosis not present

## 2017-04-27 DIAGNOSIS — Z6837 Body mass index (BMI) 37.0-37.9, adult: Secondary | ICD-10-CM

## 2017-04-27 DIAGNOSIS — G360 Neuromyelitis optica [Devic]: Secondary | ICD-10-CM

## 2017-04-27 DIAGNOSIS — Z86711 Personal history of pulmonary embolism: Secondary | ICD-10-CM | POA: Diagnosis not present

## 2017-04-27 DIAGNOSIS — E785 Hyperlipidemia, unspecified: Secondary | ICD-10-CM | POA: Diagnosis not present

## 2017-04-27 DIAGNOSIS — Z23 Encounter for immunization: Secondary | ICD-10-CM | POA: Diagnosis not present

## 2017-04-27 DIAGNOSIS — R3121 Asymptomatic microscopic hematuria: Secondary | ICD-10-CM

## 2017-04-27 DIAGNOSIS — Z87448 Personal history of other diseases of urinary system: Secondary | ICD-10-CM

## 2017-04-27 DIAGNOSIS — E66812 Obesity, class 2: Secondary | ICD-10-CM

## 2017-04-27 LAB — POCT URINALYSIS DIPSTICK
GLUCOSE UA: NEGATIVE
Nitrite, UA: NEGATIVE
Protein, UA: NEGATIVE
RBC UA: NEGATIVE
SPEC GRAV UA: 1.02 (ref 1.010–1.025)
Urobilinogen, UA: >=8 E.U./dL — AB
pH, UA: 7 (ref 5.0–8.0)

## 2017-04-27 LAB — CBC
HEMATOCRIT: 43.1 % (ref 39.0–52.0)
Hemoglobin: 14.3 g/dL (ref 13.0–17.0)
MCHC: 33.3 g/dL (ref 30.0–36.0)
MCV: 86.5 fl (ref 78.0–100.0)
Platelets: 184 10*3/uL (ref 150.0–400.0)
RBC: 4.98 Mil/uL (ref 4.22–5.81)
RDW: 13.2 % (ref 11.5–15.5)
WBC: 3.7 10*3/uL — AB (ref 4.0–10.5)

## 2017-04-27 LAB — COMPREHENSIVE METABOLIC PANEL
ALBUMIN: 4.2 g/dL (ref 3.5–5.2)
ALT: 20 U/L (ref 0–53)
AST: 20 U/L (ref 0–37)
Alkaline Phosphatase: 90 U/L (ref 39–117)
BUN: 13 mg/dL (ref 6–23)
CHLORIDE: 103 meq/L (ref 96–112)
CO2: 31 mEq/L (ref 19–32)
CREATININE: 0.98 mg/dL (ref 0.40–1.50)
Calcium: 9.3 mg/dL (ref 8.4–10.5)
GFR: 100.86 mL/min (ref >60.00)
GLUCOSE: 92 mg/dL (ref 70–99)
Potassium: 4.1 mEq/L (ref 3.5–5.1)
SODIUM: 140 meq/L (ref 135–145)
TOTAL PROTEIN: 6.6 g/dL (ref 6.0–8.3)
Total Bilirubin: 1.5 mg/dL — ABNORMAL HIGH (ref 0.2–1.2)

## 2017-04-27 LAB — LDL CHOLESTEROL, DIRECT: LDL DIRECT: 80 mg/dL

## 2017-04-27 LAB — HEMOGLOBIN A1C: Hgb A1c MFr Bld: 5.9 % (ref 4.6–6.5)

## 2017-04-27 NOTE — Patient Instructions (Addendum)
Nice to see you. We will check lab work today and contact you with the results. Please go to the pharmacy to get your shingles vaccine.

## 2017-04-27 NOTE — Assessment & Plan Note (Signed)
Stable.  On Xarelto.  No bleeding issues.  We will check a CBC.

## 2017-04-27 NOTE — Assessment & Plan Note (Signed)
Encouraged patient to get back to diet and exercise.

## 2017-04-27 NOTE — Addendum Note (Signed)
Addended by: Penne LashWIGGINS, Autumn Pruitt N on: 04/27/2017 02:15 PM   Modules accepted: Orders

## 2017-04-27 NOTE — Progress Notes (Signed)
  [name], MD Phone: [phone]  Matthew Juarez is a 57 y.o. male who presents today for follow-up.  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  He has been doing well.  No new neurological symptoms.  He is on Rituxan.  Continues to follow with neurology.  He has been doing physical therapy previously with good benefit.  He has braces for his right leg which helps him walk more effectively.  No falls.  Occasionally uses a walker.  Not using his wheelchair very much.  Hyperlipidemia: Taking Lipitor.  No chest pain or shortness of breath.  No myalgias or right upper quadrant pain.  History of PE: Taking Xarelto.  No chest pain or shortness of breath.  No lower extremity swelling.  No bleeding issues.  Patient does report a history of chickenpox when he was younger.  He is interested in the shingles vaccine.  Patient does note his weight has gone back up though he did eat fairly poorly over the holidays.  He is going to get back to watching what he eats.  Social History   Tobacco Use  Smoking Status Former Smoker  . Years: 1.00  . Types: Cigarettes  . Last attempt to quit: 04/01/1983  . Years since quitting: 34.0  Smokeless Tobacco Never Used  Tobacco Comment   only snmoke 2- cigsper day when he smoked     ROS see history of present illness  Objective  Physical Exam Vitals:   04/27/17 1023  BP: 124/84  Pulse: 69  Temp: 97.9 F (36.6 C)  SpO2: 98%    BP Readings from Last 3 Encounters:  04/27/17 124/84  12/24/16 122/62  10/23/16 112/72   Wt Readings from Last 3 Encounters:  04/27/17 (!) 323 lb 12.8 oz (146.9 kg)  12/24/16 (!) 308 lb (139.7 kg)  10/23/16 (!) 312 lb (141.5 kg)    Physical Exam  Constitutional: No distress.  Cardiovascular: Normal rate, regular rhythm and normal heart sounds.  Pulmonary/Chest: Effort normal and breath sounds normal.  Musculoskeletal: He exhibits no edema.  Neurological: He is alert.  Skin: Skin is warm and dry. He  is not diaphoretic.     Assessment/Plan: Please see individual problem list.  Neuromyelitis optica ([place]) Currently stable.  Continues to follow with neurology.  Most recent neurology note reviewed.  He will continue to stay active.  Hematuria No recurrence.  This appears to be from 2014 when he was acutely ill.  Discussed rechecking today to make sure no blood in his urine.  If negative we can remove this problem from his list.  History of pulmonary embolism Stable.  On Xarelto.  No bleeding issues.  We will check a CBC.  Hyperlipidemia Continue Lipitor.  Check LDL.  Obesity Encouraged patient to get back to diet and exercise.  Flu vaccine given today.  Discussed Shingrix with the patient.  He will check with his pharmacy regarding pricing and get this done there.  Orders Placed This Encounter  Procedures  . Flu Vaccine QUAD 36+ mos IM  . HgB A1c  . Direct LDL  . Comp Met (CMET)  . CBC  . POCT urinalysis dipstick    Standing Status:   Future    Standing Expiration Date:   05/28/2017    No orders of the defined types were placed in this encounter.    [name], MD [facility]

## 2017-04-27 NOTE — Assessment & Plan Note (Signed)
Continue Lipitor.  Check LDL. 

## 2017-04-27 NOTE — Assessment & Plan Note (Signed)
No recurrence.  This appears to be from 2014 when he was acutely ill.  Discussed rechecking today to make sure no blood in his urine.  If negative we can remove this problem from his list.

## 2017-04-27 NOTE — Assessment & Plan Note (Signed)
Currently stable.  Continues to follow with neurology.  Most recent neurology note reviewed.  He will continue to stay active.

## 2017-04-28 ENCOUNTER — Other Ambulatory Visit: Payer: Self-pay | Admitting: Family Medicine

## 2017-04-28 DIAGNOSIS — D72819 Decreased white blood cell count, unspecified: Secondary | ICD-10-CM

## 2017-04-28 DIAGNOSIS — R17 Unspecified jaundice: Secondary | ICD-10-CM

## 2017-04-30 ENCOUNTER — Other Ambulatory Visit (INDEPENDENT_AMBULATORY_CARE_PROVIDER_SITE_OTHER): Payer: Medicare Other

## 2017-04-30 DIAGNOSIS — R17 Unspecified jaundice: Secondary | ICD-10-CM

## 2017-04-30 DIAGNOSIS — D72819 Decreased white blood cell count, unspecified: Secondary | ICD-10-CM | POA: Diagnosis not present

## 2017-04-30 NOTE — Addendum Note (Signed)
Addended by: Penne LashWIGGINS, Treyshawn Muldrew N on: 04/30/2017 10:44 AM   Modules accepted: Orders

## 2017-05-01 LAB — CBC WITH DIFFERENTIAL/PLATELET
BASOS ABS: 30 {cells}/uL (ref 0–200)
BASOS PCT: 0.8 %
EOS ABS: 160 {cells}/uL (ref 15–500)
Eosinophils Relative: 4.2 %
HCT: 42.9 % (ref 38.5–50.0)
Hemoglobin: 14.9 g/dL (ref 13.2–17.1)
Lymphs Abs: 1007 cells/uL (ref 850–3900)
MCH: 29 pg (ref 27.0–33.0)
MCHC: 34.7 g/dL (ref 32.0–36.0)
MCV: 83.5 fL (ref 80.0–100.0)
MONOS PCT: 10.5 %
MPV: 11.1 fL (ref 7.5–12.5)
NEUTROS PCT: 58 %
Neutro Abs: 2204 cells/uL (ref 1500–7800)
PLATELETS: 177 10*3/uL (ref 140–400)
RBC: 5.14 10*6/uL (ref 4.20–5.80)
RDW: 12.2 % (ref 11.0–15.0)
TOTAL LYMPHOCYTE: 26.5 %
WBC: 3.8 10*3/uL (ref 3.8–10.8)
WBCMIX: 399 {cells}/uL (ref 200–950)

## 2017-05-01 LAB — HEPATITIS B CORE ANTIBODY, TOTAL: Hep B Core Total Ab: NONREACTIVE

## 2017-05-01 LAB — HEPATITIS B SURFACE ANTIBODY,QUALITATIVE: Hep B S Ab: REACTIVE — AB

## 2017-05-01 LAB — HEPATITIS B SURFACE ANTIGEN: Hepatitis B Surface Ag: NONREACTIVE

## 2017-05-01 LAB — HEPATITIS A ANTIBODY, TOTAL: Hepatitis A AB,Total: NONREACTIVE

## 2017-05-04 ENCOUNTER — Ambulatory Visit
Admission: RE | Admit: 2017-05-04 | Discharge: 2017-05-04 | Disposition: A | Discharge status: Home / Self Care | Payer: Medicare Other | Source: Ambulatory Visit | Attending: Family Medicine | Admitting: Family Medicine

## 2017-05-04 ENCOUNTER — Telehealth: Payer: Self-pay | Admitting: Rehabilitative and Restorative Service Providers"

## 2017-05-04 DIAGNOSIS — R17 Unspecified jaundice: Secondary | ICD-10-CM | POA: Diagnosis not present

## 2017-05-04 NOTE — Telephone Encounter (Signed)
[name] was contacted about AFO being hard to donn in shoe.  PT had f/u with Thayer Ohmhris ([facility] and he recommended [name] [facility] if brace can be further modified.  Arcangel Minion, PT

## 2017-05-06 ENCOUNTER — Other Ambulatory Visit: Payer: Self-pay | Admitting: Family Medicine

## 2017-05-06 DIAGNOSIS — K760 Fatty (change of) liver, not elsewhere classified: Secondary | ICD-10-CM

## 2017-05-27 ENCOUNTER — Encounter: Payer: Self-pay | Admitting: Gastroenterology

## 2017-05-27 ENCOUNTER — Encounter (INDEPENDENT_AMBULATORY_CARE_PROVIDER_SITE_OTHER): Payer: Self-pay

## 2017-05-27 ENCOUNTER — Ambulatory Visit (INDEPENDENT_AMBULATORY_CARE_PROVIDER_SITE_OTHER): Payer: Medicare Other | Admitting: Gastroenterology

## 2017-05-27 VITALS — BP 147/81 | HR 67 | Temp 97.9°F | Ht 77.0 in | Wt 326.0 lb

## 2017-05-27 DIAGNOSIS — R911 Solitary pulmonary nodule: Secondary | ICD-10-CM | POA: Insufficient documentation

## 2017-05-27 DIAGNOSIS — K760 Fatty (change of) liver, not elsewhere classified: Secondary | ICD-10-CM

## 2017-05-27 NOTE — Progress Notes (Signed)
Wyline Mood MD, MRCP(U.K) [address]  [address]  [place], [place] [address]  Main: [phone]  Fax: [phone]   Gastroenterology Consultation  Referring Provider:     Glori Luis, MD Primary Care Physician:  Glori Luis, MD Primary Gastroenterologist:  Dr. Wyline Mood  Reason for Consultation:     Fatty liver         HPI:   Matthew Juarez is a 57 y.o. y/o male referred for consultation & management  by Dr. Birdie Sons, Yehuda Mao, MD.    He has been referred for fatty liver. H/o hyperlipidemia, RUQ USG 05/04/17 shows fatty infiltration of the liver.    Labs 03/2017 shows a normal CBC including platelet count. Immune to hepatitis B.Not immune to hepatitis A. HCV ab negative. He says he just found out he had fatty liver. Denies any alcohol use, no illegal drug use, no tatoos. No military service and no blood transfusions. No family history of liver disease. In a wheel chair from back issues- transverse myelitis. Uses a walker. Last colonoscopy at age 66 and was normal. Gained weight over the past 6 months.     Past Medical History:  Diagnosis Date  . Arthritis   . Chronic back pain   . Gout   . Hammer toe   . Headache(784.0)   . History of blood clots   . Hypertension   . Pulmonary embolism ([facility])   . Transverse myelitis [facility])     Past Surgical History:  Procedure Laterality Date  . HAMMER TOE SURGERY     2016    Prior to Admission medications   Medication Sig Start Date End Date Taking? Authorizing Provider  Clobetasol Propionate 0.05 % lotion APPLY TO AFFECTED AREA TWICE A DAY AS NEEDED 10/20/16  Yes [provider]  acetaminophen (TYLENOL) 500 MG tablet Take 500 mg by mouth every 6 (six) hours as needed for mild pain.    [provider]  allopurinol (ZYLOPRIM) 300 MG tablet TAKE 1 TABLET (300 MG TOTAL) BY MOUTH DAILY. 01/19/17   Glori Luis, MD  atorvastatin (LIPITOR) 40 MG tablet TAKE 1 TABLET (40 MG TOTAL) BY MOUTH DAILY.  10/07/16   Glori Luis, MD  Cholecalciferol (VITAMIN D) 2000 UNITS CAPS Take 2,000 Units by mouth daily.    [provider]  Coenzyme Q10 (CO Q-10) 200 MG CAPS Take 1 capsule by mouth daily. 05/11/12   Angiulli, Mcarthur Rossetti, PA-C  desonide (DESOWEN) 0.05 % cream APPLY TO AFECTED AREA TWICE A DAY AS NEEDED 05/21/17   [provider]  esomeprazole (NEXIUM) 20 MG capsule Take 1 capsule (20 mg total) by mouth daily before breakfast. 10/23/16   Glori Luis, MD  Fluocinolone Acetonide Scalp 0.01 % OIL APPLY TO SCALP AT BEDTIME, WASH OUT WHEN WAKE UP 05/21/17   [provider]  gabapentin (NEURONTIN) 600 MG tablet Take 600 mg by mouth 2 (two) times daily.    [provider]  ketoconazole (NIZORAL) 2 % cream Apply 1 application topically daily. 06/30/16   Glori Luis, MD  Oxycodone HCl 10 MG TABS Take by mouth. 06/06/15   [provider]  XARELTO 20 MG TABS tablet TAKE 1 TABLET (20 MG TOTAL) BY MOUTH DAILY WITH SUPPER. 04/21/17   Glori Luis, MD    Family History  Problem Relation Age of Onset  . Dementia Mother   . Lung cancer Maternal Uncle   . Colon cancer Maternal Aunt   .  Lung cancer Maternal Aunt   . Heart disease Neg Hx      Social History   Tobacco Use  . Smoking status: Former Smoker    Years: 1.00    Types: Cigarettes    Last attempt to quit: 04/01/1983    Years since quitting: 34.1  . Smokeless tobacco: Never Used  . Tobacco comment: only snmoke 2- cigsper day when he smoked  Substance Use Topics  . Alcohol use: No    Alcohol/week: 0.0 oz  . Drug use: No    Allergies as of 05/27/2017  . (No Known Allergies)    Review of Systems:    All systems reviewed and negative except where noted in HPI.   Physical Exam:  There were no vitals taken for this visit. No LMP for male patient. Psych:  Alert and cooperative. Normal mood and affect. General:   Alert,  Well-developed, well-nourished, pleasant and cooperative in  NAD Head:  Normocephalic and atraumatic. Eyes:  Sclera clear, no icterus.   Conjunctiva pink. Ears:  Normal auditory acuity. Nose:  No deformity, discharge, or lesions. Mouth:  No deformity or lesions,oropharynx pink & moist. Neck:  Supple; no masses or thyromegaly. Lungs:  Respirations even and unlabored.  Clear throughout to auscultation.   No wheezes, crackles, or rhonchi. No acute distress. Heart:  Regular rate and rhythm; no murmurs, clicks, rubs, or gallops. Abdomen:  Normal bowel sounds.  No bruits.  Soft, non-tender and non-distended without masses, hepatosplenomegaly or hernias noted.  No guarding or rebound tenderness.    Neurologic:  Alert and oriented x3;  In a wheel chair  Skin:  Intact without significant lesions or rashes. No jaundice. Lymph Nodes:  No significant cervical adenopathy.  Imaging Studies: [place] Abdomen Limited Ruq  Result Date: 05/04/2017 CLINICAL DATA:  57 year old male with elevated bilirubin. Initial encounter. EXAM: ULTRASOUND ABDOMEN LIMITED RIGHT UPPER QUADRANT COMPARISON:  None. FINDINGS: Gallbladder: No gallstones or wall thickening visualized. No sonographic Murphy sign noted by sonographer. Common bile duct: Diameter: 4.8 mm Liver: Liver of increased echogenicity consistent with fatty infiltration and/or hepatocellular disease. No focal mass or intrahepatic biliary duct dilation noted. Portal vein is patent on color Doppler imaging with normal direction of blood flow towards the liver. IMPRESSION: Liver of increased echogenicity consistent with fatty infiltration and/or hepatocellular disease. Electronically Signed   By: Lacy DuverneySteven  Olson M.D.   On: 05/04/2017 10:03    Assessment and Plan:   Gentry RochJeffrey L Mcjunkin is a 57 y.o. y/o male has been referred for fatty liver. No biochemical or radiological evidence of cirrhosis.    Plan  1. Hepatitis A vaccine with Glori LuisSonnenberg, Eric G, MD  2. Weight loss, exercise, control of lipids, blood pressure and continue monitoring  for diabetes 3. A new drug for fatty liver is on the Horizon - will follow him up in 6 months and if new medications available will consider if appropriate.  4. Fatty liver hand out.  5. 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

## 2017-06-16 DIAGNOSIS — G8929 Other chronic pain: Secondary | ICD-10-CM | POA: Diagnosis not present

## 2017-06-16 DIAGNOSIS — G373 Acute transverse myelitis in demyelinating disease of central nervous system: Secondary | ICD-10-CM | POA: Diagnosis not present

## 2017-06-16 DIAGNOSIS — Z79891 Long term (current) use of opiate analgesic: Secondary | ICD-10-CM | POA: Diagnosis not present

## 2017-08-04 DIAGNOSIS — G360 Neuromyelitis optica [Devic]: Secondary | ICD-10-CM | POA: Diagnosis not present

## 2017-08-25 ENCOUNTER — Ambulatory Visit: Payer: Medicare Other

## 2017-09-09 ENCOUNTER — Ambulatory Visit (INDEPENDENT_AMBULATORY_CARE_PROVIDER_SITE_OTHER): Payer: Medicare Other

## 2017-09-09 VITALS — BP 119/76 | HR 68 | Temp 98.1°F | Resp 15 | Ht 76.0 in | Wt 316.1 lb

## 2017-09-09 DIAGNOSIS — Z0000 Encounter for general adult medical examination without abnormal findings: Secondary | ICD-10-CM

## 2017-09-09 NOTE — Progress Notes (Signed)
Subjective:   Matthew Juarez is a 57 y.o. male who presents for Medicare Annual/Subsequent preventive examination.  Review of Systems:  No ROS.  Medicare Wellness Visit. Additional risk factors are reflected in the social history. Cardiac Risk Factors include: advanced age (>74men, >54 women);male gender;hypertension;obesity (BMI >30kg/m2)     Objective:    Vitals: BP 119/76 (BP Location: Left Arm, Patient Position: Sitting, Cuff Size: Normal)   Pulse 68   Temp 98.1 F (36.7 C) (Oral)   Resp 15   Ht 6\' 4"  (1.93 m)   Wt (!) 316 lb 1.9 oz (143.4 kg)   SpO2 98%   BMI 38.48 kg/m   Body mass index is 38.48 kg/m.  Advanced Directives 09/09/2017 08/21/2016 12/22/2014 06/23/2012 04/17/2012  Does Patient Have a Medical Advance Directive? No No No Patient does not have advance directive Patient does not have advance directive;Patient would not like information  Would patient like information on creating a medical advance directive? Yes (MAU/Ambulatory/Procedural Areas - Information given) Yes (MAU/Ambulatory/Procedural Areas - Information given) - - -  Pre-existing out of facility DNR order (yellow form or pink MOST form) - - - No No    Tobacco Social History   Tobacco Use  Smoking Status Former Smoker  . Years: 1.00  . Types: Cigarettes  . Last attempt to quit: 04/01/1983  . Years since quitting: 34.4  Smokeless Tobacco Never Used  Tobacco Comment   only snmoke 2- cigsper day when he smoked     Counseling given: Not Answered Comment: only snmoke 2- cigsper day when he smoked   Clinical Intake:  Pre-visit preparation completed: Yes  Pain : No/denies pain     Nutritional Status: BMI > 30  Obese Diabetes: No(Followed by pcp)  How often do you need to have someone help you when you read instructions, pamphlets, or other written materials from your doctor or pharmacy?: 1 - Never  Interpreter Needed?: No     Past Medical History:  Diagnosis Date  . Arthritis   .  Chronic back pain   . Gout   . Hammer toe   . Headache(784.0)   . History of blood clots   . Hypertension   . Pulmonary embolism (HCC)   . Transverse myelitis [facility])    Past Surgical History:  Procedure Laterality Date  . HAMMER TOE SURGERY     2016   Family History  Problem Relation Age of Onset  . Dementia Mother   . Lung cancer Maternal Uncle   . Colon cancer Maternal Aunt   . Lung cancer Maternal Aunt   . Heart disease Neg Hx    Social History   Socioeconomic History  . Marital status: Married    Spouse name: Not on file  . Number of children: Not on file  . Years of education: Not on file  . Highest education level: Not on file  Occupational History  . Occupation: disability  Social Needs  . Financial resource strain: Not hard at all  . Food insecurity:    Worry: Never true    Inability: Never true  . Transportation needs:    Medical: No    Non-medical: No  Tobacco Use  . Smoking status: Former Smoker    Years: 1.00    Types: Cigarettes    Last attempt to quit: 04/01/1983    Years since quitting: 34.4  . Smokeless tobacco: Never Used  . Tobacco comment: only snmoke 2- cigsper day when he smoked  Substance  and Sexual Activity  . Alcohol use: No    Alcohol/week: 0.0 oz  . Drug use: No  . Sexual activity: Not Currently  Lifestyle  . Physical activity:    Days per week: Not on file    Minutes per session: Not on file  . Stress: Not at all  Relationships  . Social connections:    Talks on phone: Not on file    Gets together: Not on file    Attends religious service: Not on file    Active member of club or organization: Not on file    Attends meetings of clubs or organizations: Not on file    Relationship status: Not on file  Other Topics Concern  . Not on file  Social History Narrative  . Not on file    Outpatient Encounter Medications as of 09/09/2017  Medication Sig  . acetaminophen (TYLENOL) 500 MG tablet Take 500 mg by mouth every 6 (six)  hours as needed for mild pain.  Marland Kitchen allopurinol (ZYLOPRIM) 300 MG tablet TAKE 1 TABLET (300 MG TOTAL) BY MOUTH DAILY.  Marland Kitchen atorvastatin (LIPITOR) 40 MG tablet TAKE 1 TABLET (40 MG TOTAL) BY MOUTH DAILY.  Marland Kitchen Cholecalciferol (VITAMIN D) 2000 UNITS CAPS Take 2,000 Units by mouth daily.  . Clobetasol Propionate 0.05 % lotion APPLY TO AFFECTED AREA TWICE A DAY AS NEEDED  . Coenzyme Q10 (CO Q-10) 200 MG CAPS Take 1 capsule by mouth daily.  Marland Kitchen desonide (DESOWEN) 0.05 % cream APPLY TO AFECTED AREA TWICE A DAY AS NEEDED  . esomeprazole (NEXIUM) 20 MG capsule Take 1 capsule (20 mg total) by mouth daily before breakfast.  . Fluocinolone Acetonide Scalp 0.01 % OIL APPLY TO SCALP AT BEDTIME, WASH OUT WHEN WAKE UP  . gabapentin (NEURONTIN) 600 MG tablet Take 600 mg by mouth 2 (two) times daily.  Marland Kitchen ketoconazole (NIZORAL) 2 % cream Apply 1 application topically daily.  . Oxycodone HCl 10 MG TABS Take by mouth.  Carlena Hurl 20 MG TABS tablet TAKE 1 TABLET (20 MG TOTAL) BY MOUTH DAILY WITH SUPPER.   No facility-administered encounter medications on file as of 09/09/2017.     Activities of Daily Living In your present state of health, do you have any difficulty performing the following activities: 09/09/2017  Hearing? N  Vision? N  Difficulty concentrating or making decisions? N  Walking or climbing stairs? Y  Comment self pace.  unsteady gait.  walker or wheelchair in use.   Dressing or bathing? N  Doing errands, shopping? 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 N  Using the Toilet? N  In the past six months, have you accidently leaked urine? N  Do you have problems with loss of bowel control? N  Managing your Medications? N  Managing your Finances? N  Housekeeping or managing your Housekeeping? N  Some recent data might be hidden    Patient Care Team: Glori Luis, MD as PCP - General (Family Medicine)   Assessment:   This is a routine wellness examination for Matthew Juarez. The goal  of the wellness visit is to assist the patient how to close the gaps in care and create a preventative care plan for the patient.   The roster of all physicians providing medical care to patient is listed in the Snapshot section of the chart.  Osteoporosis risk reviewed.    Safety issues reviewed; Smoke and carbon monoxide detectors in the home. No firearms in the home.  Wears seatbelts when riding with others. No violence in the home.  They do not have excessive sun exposure.  Discussed the need for sun protection: hats, long sleeves and the use of sunscreen if there is significant sun exposure.  Patient is alert, normal appearance, oriented to person/place/and time.  Correctly identified the president of the [place] and recalls of 3/3 words. Performs simple calculations and can read correct time from watch face.  Displays appropriate judgement.  No new identified risk were noted.  No failures at ADL's or IADL's.  Ambulates with walker/wheelchair as needed.  BMI- discussed the importance of a healthy diet, water intake and the benefits of aerobic exercise. Educational material provided.   24 hour diet recall: Low cholesterol/low carb diet  Dental- every 6 months.  Eye- Visual acuity not assessed per patient preference since they have regular follow up with the ophthalmologist.  Wears corrective lenses.  Sleep patterns- Sleeps through the night without issues  Health maintenance gaps- closed.  Patient Concerns: None at this time. Follow up with PCP as needed.  Exercise Activities and Dietary recommendations Current Exercise Habits: The patient does not participate in regular exercise at present  Goals    . Increase physical activity     Water aerobics.  Educational material provided.        Fall Risk Fall Risk  09/09/2017 08/21/2016  Falls in the past year? No No   Depression Screen PHQ 2/9 Scores 09/09/2017 08/21/2016 10/12/2015  PHQ - 2 Score 0 0 0  PHQ- 9 Score - 0 -     Cognitive Function MMSE - Mini Mental State Exam 09/09/2017 08/21/2016  Orientation to time 5 5  Orientation to Place 5 5  Registration 3 3  Attention/ Calculation 5 5  Recall 3 3  Language- name 2 objects 2 2  Language- repeat 1 1  Language- follow 3 step command 3 3  Language- read & follow direction 1 1  Write a sentence 1 1  Copy design 1 1  Total score 30 30        Immunization History  Administered Date(s) Administered  . Influenza Split 01/30/2012  . Influenza,inj,Quad PF,6+ Mos 12/29/2013, 01/16/2016, 04/27/2017  . PPD Test 12/05/2011   Screening Tests Health Maintenance  Topic Date Due  . INFLUENZA VACCINE  10/29/2017  . COLONOSCOPY  01/30/2020  . TETANUS/TDAP  04/01/2021  . Hepatitis C Screening  Completed  . HIV Screening  Completed       Plan:    End of life planning; Advance aging; Advanced directives discussed. Copy of current HCPOA/Living Will requested once completed.     I have personally reviewed and noted the following in the patient's chart:   . Medical and social history . Use of alcohol, tobacco or illicit drugs  . Current medications and supplements . Functional ability and status . Nutritional status . Physical activity . Advanced directives . List of other physicians . Hospitalizations, surgeries, and ER visits in previous 12 months . Vitals . Screenings to include cognitive, depression, and falls . Referrals and appointments  In addition, I have reviewed and discussed with patient certain preventive protocols, quality metrics, and best practice recommendations. A written personalized care plan for preventive services as well as general preventive health recommendations were provided to patient.     Ashok PallOBrien-Blaney, Denisa L, LPN  1/61/09606/02/2018

## 2017-09-09 NOTE — Patient Instructions (Addendum)
  [name] , Thank you for taking time to come for your Medicare Wellness Visit. I appreciate your ongoing commitment to your health goals. Please review the following plan we discussed and let me know if I can assist you in the future.   Follow up as needed.    Bring a copy of your Health Care Power of Attorney and/or Living Will to be scanned into chart.  Have a great day!  These are the goals we discussed: Goals    . Increase physical activity     Water aerobics.  Educational material provided.        This is a list of the screening recommended for you and due dates:  Health Maintenance  Topic Date Due  . Flu Shot  10/29/2017  . Colon Cancer Screening  01/30/2020  . Tetanus Vaccine  04/01/2021  .  Hepatitis C: One time screening is recommended by Center for Disease Control  (CDC) for  adults born from [birth year] through 1967.   Completed  . HIV Screening  Completed

## 2017-09-11 DIAGNOSIS — G360 Neuromyelitis optica [Devic]: Secondary | ICD-10-CM | POA: Diagnosis not present

## 2017-09-15 DIAGNOSIS — Z79891 Long term (current) use of opiate analgesic: Secondary | ICD-10-CM | POA: Diagnosis not present

## 2017-09-15 DIAGNOSIS — G373 Acute transverse myelitis in demyelinating disease of central nervous system: Secondary | ICD-10-CM | POA: Diagnosis not present

## 2017-09-15 DIAGNOSIS — G894 Chronic pain syndrome: Secondary | ICD-10-CM | POA: Diagnosis not present

## 2017-09-24 ENCOUNTER — Telehealth: Payer: Self-pay | Admitting: Family Medicine

## 2017-09-24 NOTE — Telephone Encounter (Signed)
Please create a written prescription for the patient and then I can fill in the information needed.

## 2017-09-24 NOTE — Telephone Encounter (Signed)
Placed on your desk. 

## 2017-09-24 NOTE — Telephone Encounter (Signed)
Copied from CRM [id number]. Topic: Quick Communication - See Telephone Encounter >> Sep 24, 2017  8:36 AM Gaynelle AduPoole, Shalonda wrote: CRM for notification. See Telephone encounter for: 09/24/17. Patient  is calling request a Manual wheelchair, stated the one that he has the firm is broken

## 2017-09-24 NOTE — Telephone Encounter (Signed)
Please advise 

## 2017-09-25 NOTE — Telephone Encounter (Signed)
Placed at front desk. Patient notified

## 2017-09-25 NOTE — Telephone Encounter (Signed)
Order signed.

## 2017-10-06 DIAGNOSIS — H35362 Drusen (degenerative) of macula, left eye: Secondary | ICD-10-CM | POA: Diagnosis not present

## 2017-10-06 DIAGNOSIS — H35033 Hypertensive retinopathy, bilateral: Secondary | ICD-10-CM | POA: Diagnosis not present

## 2017-10-06 DIAGNOSIS — H2513 Age-related nuclear cataract, bilateral: Secondary | ICD-10-CM | POA: Diagnosis not present

## 2017-10-06 DIAGNOSIS — H25013 Cortical age-related cataract, bilateral: Secondary | ICD-10-CM | POA: Diagnosis not present

## 2017-10-23 DIAGNOSIS — G379 Demyelinating disease of central nervous system, unspecified: Secondary | ICD-10-CM | POA: Diagnosis not present

## 2017-10-23 DIAGNOSIS — G360 Neuromyelitis optica [Devic]: Secondary | ICD-10-CM | POA: Diagnosis not present

## 2017-10-26 ENCOUNTER — Encounter: Payer: Self-pay | Admitting: Family Medicine

## 2017-10-26 ENCOUNTER — Ambulatory Visit (INDEPENDENT_AMBULATORY_CARE_PROVIDER_SITE_OTHER): Payer: Medicare Other | Admitting: Family Medicine

## 2017-10-26 VITALS — BP 118/88 | HR 76 | Temp 97.9°F | Ht 76.0 in | Wt 318.0 lb

## 2017-10-26 DIAGNOSIS — M1A0710 Idiopathic chronic gout, right ankle and foot, without tophus (tophi): Secondary | ICD-10-CM

## 2017-10-26 DIAGNOSIS — K760 Fatty (change of) liver, not elsewhere classified: Secondary | ICD-10-CM | POA: Insufficient documentation

## 2017-10-26 DIAGNOSIS — Z8679 Personal history of other diseases of the circulatory system: Secondary | ICD-10-CM

## 2017-10-26 DIAGNOSIS — E785 Hyperlipidemia, unspecified: Secondary | ICD-10-CM

## 2017-10-26 DIAGNOSIS — G360 Neuromyelitis optica [Devic]: Secondary | ICD-10-CM

## 2017-10-26 DIAGNOSIS — Z86711 Personal history of pulmonary embolism: Secondary | ICD-10-CM

## 2017-10-26 DIAGNOSIS — R7303 Prediabetes: Secondary | ICD-10-CM

## 2017-10-26 DIAGNOSIS — Z125 Encounter for screening for malignant neoplasm of prostate: Secondary | ICD-10-CM | POA: Diagnosis not present

## 2017-10-26 DIAGNOSIS — K219 Gastro-esophageal reflux disease without esophagitis: Secondary | ICD-10-CM

## 2017-10-26 LAB — COMPREHENSIVE METABOLIC PANEL
ALBUMIN: 4.3 g/dL (ref 3.5–5.2)
ALK PHOS: 88 U/L (ref 39–117)
ALT: 20 U/L (ref 0–53)
AST: 18 U/L (ref 0–37)
BILIRUBIN TOTAL: 1.5 mg/dL — AB (ref 0.2–1.2)
BUN: 17 mg/dL (ref 6–23)
CALCIUM: 9.6 mg/dL (ref 8.4–10.5)
CO2: 33 meq/L — AB (ref 19–32)
CREATININE: 0.98 mg/dL (ref 0.40–1.50)
Chloride: 105 mEq/L (ref 96–112)
GFR: 100.68 mL/min (ref >60.00)
Glucose, Bld: 94 mg/dL (ref 70–99)
Potassium: 4.5 mEq/L (ref 3.5–5.1)
Sodium: 143 mEq/L (ref 135–145)
TOTAL PROTEIN: 6.4 g/dL (ref 6.0–8.3)

## 2017-10-26 LAB — LIPID PANEL
CHOL/HDL RATIO: 3
CHOLESTEROL: 131 mg/dL (ref 0–200)
HDL: 42.7 mg/dL (ref >39.00)
LDL Cholesterol: 70 mg/dL (ref 0–99)
NonHDL: 88.35
TRIGLYCERIDES: 91 mg/dL (ref 0.0–149.0)
VLDL: 18.2 mg/dL (ref 0.0–40.0)

## 2017-10-26 LAB — PSA, MEDICARE: PSA: 0.45 ng/mL (ref 0.10–4.00)

## 2017-10-26 LAB — URIC ACID: Uric Acid, Serum: 4.8 mg/dL (ref 4.0–7.8)

## 2017-10-26 LAB — HEMOGLOBIN A1C: Hgb A1c MFr Bld: 5.9 % (ref 4.6–6.5)

## 2017-10-26 NOTE — Assessment & Plan Note (Signed)
Stable Continue Xarelto 

## 2017-10-26 NOTE — Assessment & Plan Note (Signed)
Found on imaging.  He has seen GI.  He will follow-up with him.  He will work on dietary changes and try to stay active.  Discussed hepatitis A vaccine with him.  We will check on coverage for this in the office.

## 2017-10-26 NOTE — Assessment & Plan Note (Signed)
Well-controlled.  Continue current regimen. 

## 2017-10-26 NOTE — Assessment & Plan Note (Signed)
Borderline elevated.  Has a history in the past.  We will have him return in 2 to 3 weeks for BP check with nursing prior to considering medication.

## 2017-10-26 NOTE — Progress Notes (Signed)
  [name], MD Phone: [phone]  Matthew Juarez is a 57 y.o. male who presents today for f/u.  CC: elevated BP, HLD, GERD, fatty liver, neuromyelitis optica  Elevated blood pressure: Not checking at home.  No chest pain, shortness breath, or edema.  Hyperlipidemia: Taking Lipitor.  No right upper quadrant pain or myalgias.  GERD: Taking Nexium.  No reflux, abdominal pain, blood in his stool, or trouble swallowing.  Neuromyelitis optica: He continues to see neurology.  He continues to get infusions.  He recently had an MRI completed at [facility] to follow-up on this.  He has been ambulating well with a walker.  Only occasionally uses his wheelchair.  Fatty liver: He saw GI for this.  Urged weight loss and exercise.  They recommended hepatitis A vaccine.  He continues on Xarelto given history of PE.  No bleeding issues.  Social History   Tobacco Use  Smoking Status Former Smoker  . Years: 1.00  . Types: Cigarettes  . Last attempt to quit: 04/01/1983  . Years since quitting: 34.5  Smokeless Tobacco Never Used  Tobacco Comment   only snmoke 2- cigsper day when he smoked     ROS see history of present illness  Objective  Physical Exam Vitals:   10/26/17 1024 10/26/17 1043  BP: 128/90 118/88  Pulse: 76   Temp: 97.9 F (36.6 C)   SpO2: 96%     BP Readings from Last 3 Encounters:  10/26/17 118/88  09/09/17 119/76  05/27/17 (!) 147/81   Wt Readings from Last 3 Encounters:  10/26/17 (!) 318 lb (144.2 kg)  09/09/17 (!) 316 lb 1.9 oz (143.4 kg)  05/27/17 (!) 326 lb (147.9 kg)    Physical Exam  Constitutional: No distress.  Cardiovascular: Normal rate, regular rhythm and normal heart sounds.  Pulmonary/Chest: Effort normal and breath sounds normal.  Musculoskeletal: He exhibits no edema.  Neurological: He is alert.  Skin: Skin is warm and dry. He is not diaphoretic.     Assessment/Plan: Please see individual problem list.  History of  hypertension Borderline elevated.  Has a history in the past.  We will have him return in 2 to 3 weeks for BP check with nursing prior to considering medication.  History of pulmonary embolism Stable.  Continue Xarelto.  Neuromyelitis optica ([place]) Seems to be doing quite well.  He will continue to follow with neurology at [facility].  GERD (gastroesophageal reflux disease) Well-controlled.  Continue current regimen.  Prediabetes Check A1c.  Hyperlipidemia Check lipid panel.  Continue Lipitor.  Fatty liver Found on imaging.  He has seen GI.  He will follow-up with him.  He will work on dietary changes and try to stay active.  Discussed hepatitis A vaccine with him.  We will check on coverage for this in the office.   Orders Placed This Encounter  Procedures  . Lipid panel  . Comp Met (CMET)  . HgB A1c  . PSA, Medicare  . Uric acid    No orders of the defined types were placed in this encounter.    [name], MD [place]

## 2017-10-26 NOTE — Assessment & Plan Note (Signed)
Seems to be doing quite well.  He [facility].

## 2017-10-26 NOTE — Assessment & Plan Note (Signed)
Check lipid panel.  Continue Lipitor. 

## 2017-10-26 NOTE — Assessment & Plan Note (Signed)
Check A1c. 

## 2017-10-26 NOTE — Patient Instructions (Addendum)
Nice to see you. We will get lab work today and contact you with the results. We will have you return in 2 to 3 weeks for repeat blood pressure check.

## 2017-11-02 NOTE — Progress Notes (Signed)
The Hep A  is covered under his [employer] . He will have to make a $40.00 copay and [employer] will cover the rest of the cost when he has this done in the office. [employer] does not cover Hep A they will cover Hep B .

## 2017-11-09 NOTE — Progress Notes (Signed)
He only needs the hepatitis A vaccine. That is fine for him to get at his nurse visit.

## 2017-11-09 NOTE — Progress Notes (Signed)
Patient notified and states he would like these vaccines Wednesday at his nurse visit appmt. Is this ok?

## 2017-11-12 ENCOUNTER — Ambulatory Visit (INDEPENDENT_AMBULATORY_CARE_PROVIDER_SITE_OTHER): Payer: Medicare Other

## 2017-11-12 DIAGNOSIS — Z23 Encounter for immunization: Secondary | ICD-10-CM

## 2017-11-12 DIAGNOSIS — Z8679 Personal history of other diseases of the circulatory system: Secondary | ICD-10-CM | POA: Diagnosis not present

## 2017-11-12 NOTE — Progress Notes (Signed)
Patient presented for Hep A injection to left deltoid, patient voiced no concerns nor showed any signs of distress during injection.  He is also here for a BP check due to his bp being low at last visit, as per patient.  Currently patients BP is 138/62 and BPM is 59.  Patient has no complaints of headaches, blurry vision, chest pain, arm pain, light headedness, dizziness, and nor jaw pain

## 2017-11-12 NOTE — Progress Notes (Signed)
BP is very good at 138/62

## 2017-11-25 ENCOUNTER — Encounter: Payer: Self-pay | Admitting: Gastroenterology

## 2017-11-25 ENCOUNTER — Ambulatory Visit (INDEPENDENT_AMBULATORY_CARE_PROVIDER_SITE_OTHER): Payer: Medicare Other | Admitting: Gastroenterology

## 2017-11-25 VITALS — BP 126/74 | HR 73 | Ht 76.0 in | Wt 312.2 lb

## 2017-11-25 DIAGNOSIS — K760 Fatty (change of) liver, not elsewhere classified: Secondary | ICD-10-CM | POA: Diagnosis not present

## 2017-11-25 NOTE — Progress Notes (Signed)
Jonathon Bellows MD, MRCP(U.K) [address]  [place]  [place], [place] [address]  Main: [phone]  Fax: [phone]   Primary Care Physician: Leone Haven, MD  Primary Gastroenterologist:  Dr. Jonathon Bellows   Chief Complaint  Patient presents with  . Follow-up    NAFLD    HPI: AZAVION BOUILLON is a 57 y.o. male  Summary of history :  He was initially referred and seen in 05/2017 when he was referred for fatty liver. H/o hyperlipidemia, RUQ USG 05/04/17 shows fatty infiltration of the liver. Labs 03/2017 shows a normal CBC including platelet count. Immune to hepatitis B.Not immune to hepatitis A. HCV ab negative.No military service and no blood transfusions. No family history of liver disease. In a wheel chair from back issues- transverse myelitis. Uses a walker. Last colonoscopy at age 61 and was normal. Gained weight over the past 6 months.  Interval history   05/27/2017-  11/25/2017  Lost 14 lbs - intentionally- feeling good. Did get his hepatitis A vaccine- last dose was a few weeks back . Due for tetanus shot in 2023.    Hepatic Function Latest Ref Rng & Units 10/26/2017 04/27/2017 11/04/2016  Total Protein 6.0 - 8.3 g/dL 6.4 6.6 -  Albumin 3.5 - 5.2 g/dL 4.3 4.2 -  AST 0 - 37 U/L 18 20 -  ALT 0 - 53 U/L 20 20 -  Alk Phosphatase 39 - 117 U/L 88 90 -  Total Bilirubin 0.2 - 1.2 mg/dL 1.5(H) 1.5(H) 1.5(H)  Bilirubin, Direct <=0.2 mg/dL - - 0.4(H)   CBC Latest Ref Rng & Units 04/30/2017 04/27/2017 04/14/2016  WBC 3.8 - 10.8 Thousand/uL 3.8 3.7(L) 4.2  Hemoglobin 13.2 - 17.1 g/dL 14.9 14.3 13.9  Hematocrit 38.5 - 50.0 % 42.9 43.1 41.6  Platelets 140 - 400 Thousand/uL 177 184.0 169.0     Current Outpatient Medications  Medication Sig Dispense Refill  . acetaminophen (TYLENOL) 500 MG tablet Take 500 mg by mouth every 6 (six) hours as needed for mild pain.    Marland Kitchen allopurinol (ZYLOPRIM) 300 MG tablet TAKE 1 TABLET (300 MG TOTAL) BY MOUTH DAILY. 90 tablet 3  . atorvastatin  (LIPITOR) 40 MG tablet TAKE 1 TABLET (40 MG TOTAL) BY MOUTH DAILY. 90 tablet 3  . Cholecalciferol (VITAMIN D) 2000 UNITS CAPS Take 2,000 Units by mouth daily.    . Clobetasol Propionate 0.05 % lotion APPLY TO AFFECTED AREA TWICE A DAY AS NEEDED    . Coenzyme Q10 (CO Q-10) 200 MG CAPS Take 1 capsule by mouth daily. 30 each 1  . desonide (DESOWEN) 0.05 % cream APPLY TO AFECTED AREA TWICE A DAY AS NEEDED  3  . esomeprazole (NEXIUM) 20 MG capsule Take 1 capsule (20 mg total) by mouth daily before breakfast. 90 capsule 3  . Fluocinolone Acetonide Scalp 0.01 % OIL APPLY TO SCALP AT BEDTIME, WASH OUT WHEN WAKE UP  3  . gabapentin (NEURONTIN) 600 MG tablet Take 600 mg by mouth 2 (two) times daily.    Marland Kitchen ketoconazole (NIZORAL) 2 % cream Apply 1 application topically daily. 15 g 0  . Oxycodone HCl 10 MG TABS Take by mouth.    Alveda Reasons 20 MG TABS tablet TAKE 1 TABLET (20 MG TOTAL) BY MOUTH DAILY WITH SUPPER. 30 tablet 11   No current facility-administered medications for this visit.     Allergies as of 11/25/2017  . (No Known Allergies)    ROS:  General: Negative for anorexia, weight  loss, fever, chills, fatigue, weakness. ENT: Negative for hoarseness, difficulty swallowing , nasal congestion. CV: Negative for chest pain, angina, palpitations, dyspnea on exertion, peripheral edema.  Respiratory: Negative for dyspnea at rest, dyspnea on exertion, cough, sputum, wheezing.  GI: See history of present illness. GU:  Negative for dysuria, hematuria, urinary incontinence, urinary frequency, nocturnal urination.  Endo: Negative for unusual weight change.    Physical Examination:   BP 126/74   Pulse 73   Ht 6' 4" (1.93 m)   Wt (!) 312 lb 3.2 oz (141.6 kg)   BMI 38.00 kg/m   General: comfortable in his wheel chair  Eyes: No icterus. Conjunctivae pink. Mouth: Oropharyngeal mucosa moist and pink , no lesions erythema or exudate. Lungs: Clear to auscultation bilaterally. Non-labored. Heart: Regular  rate and rhythm, no murmurs rubs or gallops.  Abdomen: Bowel sounds are normal, nontender, nondistended, no hepatosplenomegaly or masses, no abdominal bruits or hernia , no rebound or guarding.   Extremities: No lower extremity edema. No clubbing or deformities. Neuro: Alert and oriented x 3.  Grossly intact. Skin: Warm and dry, no jaundice.   Psych: Alert and cooperative, normal mood and affect.   Imaging Studies: No results found.  Assessment and Plan:   Malon L Maj is a 57 y.o. y/o male here to follow up  for fatty liver. No biochemical or radiological evidence of cirrhosis. Doing well , eating more healthy, losing weight    Plan  1. Hepatitis A immune status to be checked post vacicnation  2.  Elevated indirect  predominently indirect bilirubin ,  likely has Gilberts disease which is benign  3. Continue life style changes for management of fatty liver. No new medications yet available for this condition.   Dr Kiran Anna  MD,MRCP (U.K) Follow up in 1 year  

## 2017-11-26 LAB — HEPATITIS A ANTIBODY, TOTAL: HEP A TOTAL AB: NEGATIVE

## 2017-12-08 ENCOUNTER — Telehealth: Payer: Self-pay | Admitting: Gastroenterology

## 2017-12-08 ENCOUNTER — Telehealth: Payer: Self-pay

## 2017-12-08 NOTE — Telephone Encounter (Signed)
Called pt to inform him of lab result and Dr. Johnney Killian instructions for the pt to return to our office for Hep A vaccine. LVM to return call

## 2017-12-08 NOTE — Telephone Encounter (Signed)
-----   Message from Kiran Anna, MD sent at 12/03/2017  9:11 AM EDT ----- He needs hep A vaccine to be given at the office 

## 2017-12-08 NOTE — Telephone Encounter (Signed)
PT left vm he is returning a call

## 2017-12-09 NOTE — Telephone Encounter (Signed)
Pt. Has been notified of lab result and of Dr. Johnney Killian instructions to receive Hep A vaccine series. Pt states he will receive the vaccine series at his primary care office.

## 2017-12-09 NOTE — Telephone Encounter (Signed)
-----   Message from Wyline Mood, MD sent at 12/03/2017  9:11 AM EDT ----- He needs hep A vaccine to be given at the office

## 2017-12-14 DIAGNOSIS — M25512 Pain in left shoulder: Secondary | ICD-10-CM | POA: Diagnosis not present

## 2017-12-14 DIAGNOSIS — G8929 Other chronic pain: Secondary | ICD-10-CM | POA: Diagnosis not present

## 2017-12-15 DIAGNOSIS — G360 Neuromyelitis optica [Devic]: Secondary | ICD-10-CM | POA: Diagnosis not present

## 2017-12-16 DIAGNOSIS — L218 Other seborrheic dermatitis: Secondary | ICD-10-CM | POA: Diagnosis not present

## 2017-12-30 ENCOUNTER — Other Ambulatory Visit: Payer: Self-pay | Admitting: Family Medicine

## 2018-01-20 ENCOUNTER — Other Ambulatory Visit: Payer: Self-pay | Admitting: Family Medicine

## 2018-02-09 ENCOUNTER — Encounter: Payer: Self-pay | Admitting: Family Medicine

## 2018-02-09 ENCOUNTER — Ambulatory Visit (INDEPENDENT_AMBULATORY_CARE_PROVIDER_SITE_OTHER): Payer: Medicare Other | Admitting: Family Medicine

## 2018-02-09 VITALS — BP 108/80 | HR 64 | Temp 97.8°F | Ht 76.0 in | Wt 312.4 lb

## 2018-02-09 DIAGNOSIS — E785 Hyperlipidemia, unspecified: Secondary | ICD-10-CM

## 2018-02-09 DIAGNOSIS — K760 Fatty (change of) liver, not elsewhere classified: Secondary | ICD-10-CM | POA: Diagnosis not present

## 2018-02-09 DIAGNOSIS — R7303 Prediabetes: Secondary | ICD-10-CM | POA: Diagnosis not present

## 2018-02-09 DIAGNOSIS — Z23 Encounter for immunization: Secondary | ICD-10-CM

## 2018-02-09 DIAGNOSIS — M1A0710 Idiopathic chronic gout, right ankle and foot, without tophus (tophi): Secondary | ICD-10-CM

## 2018-02-09 NOTE — Progress Notes (Signed)
  Matthew AlarEric Videl Nobrega, MD Phone: [phone]  Matthew Juarez is a 57 y.o. male who presents today for f/u.  CC: gout, hld, prediabetes  Gout: Patient notes he is taking allopurinol.  He has had no gout flares recently.  Recent uric acid well-controlled.  Hyperlipidemia: Taking Lipitor.  No chest pain, right upper quadrant pain, or myalgias.  Prediabetes: No polyuria or polydipsia.  He is walking some for exercise.  He is altered his diet and is getting lots of fruits and leafy greens.  Rare fried foods.  Drinking lots more water.  Minimal diet soda and sweet tea.  Patient reports he got the hepatitis A vaccine previously.  He needs to complete the series.  He is not due for this until February.  Social History   Tobacco Use  Smoking Status Former Smoker  . Years: 1.00  . Types: Cigarettes  . Last attempt to quit: 04/01/1983  . Years since quitting: 34.8  Smokeless Tobacco Never Used  Tobacco Comment   only snmoke 2- cigsper day when he smoked     ROS see history of present illness  Objective  Physical Exam Vitals:   02/09/18 1002  BP: 108/80  Pulse: 64  Temp: 97.8 F (36.6 C)  SpO2: 97%    BP Readings from Last 3 Encounters:  02/09/18 108/80  11/25/17 126/74  10/26/17 118/88   Wt Readings from Last 3 Encounters:  02/09/18 (!) 312 lb 6.4 oz (141.7 kg)  11/25/17 (!) 312 lb 3.2 oz (141.6 kg)  10/26/17 (!) 318 lb (144.2 kg)    Physical Exam  Constitutional: No distress.  Cardiovascular: Normal rate, regular rhythm and normal heart sounds.  Pulmonary/Chest: Effort normal and breath sounds normal.  Musculoskeletal: He exhibits no edema.  Neurological: He is alert.  Skin: Skin is warm and dry. He is not diaphoretic.     Assessment/Plan: Please see individual problem list.  Fatty liver Discussed that the patient will be due for his hepatitis A vaccine at follow-up in about 4 months.  Gout Well-controlled.  Continue allopurinol.  Hyperlipidemia Continue  Lipitor.  Was well controlled.  Continue diet and exercise.  Prediabetes Previously well controlled.  Continue diet and exercise.  Recheck at next visit.    Orders Placed This Encounter  Procedures  . Flu Vaccine QUAD 6+ mos PF IM (Fluarix Quad PF)    No orders of the defined types were placed in this encounter.    Matthew AlarEric Machele Deihl, MD [facility]

## 2018-02-09 NOTE — Assessment & Plan Note (Signed)
Discussed that the patient will be due for his hepatitis A vaccine at follow-up in about 4 months.

## 2018-02-09 NOTE — Assessment & Plan Note (Signed)
Well controlled. Continue allopurinol.  

## 2018-02-09 NOTE — Patient Instructions (Signed)
Nice to see you. Please continue to work on diet and exercise.

## 2018-02-09 NOTE — Assessment & Plan Note (Signed)
Continue Lipitor.  Was well controlled.  Continue diet and exercise.

## 2018-02-09 NOTE — Assessment & Plan Note (Signed)
Previously well controlled.  Continue diet and exercise.  Recheck at next visit.

## 2018-02-12 ENCOUNTER — Other Ambulatory Visit: Payer: Self-pay | Admitting: Family Medicine

## 2018-03-18 DIAGNOSIS — G8929 Other chronic pain: Secondary | ICD-10-CM | POA: Diagnosis not present

## 2018-03-18 DIAGNOSIS — M25512 Pain in left shoulder: Secondary | ICD-10-CM | POA: Diagnosis not present

## 2018-03-18 DIAGNOSIS — G894 Chronic pain syndrome: Secondary | ICD-10-CM | POA: Diagnosis not present

## 2018-03-18 DIAGNOSIS — Z79891 Long term (current) use of opiate analgesic: Secondary | ICD-10-CM | POA: Diagnosis not present

## 2018-04-13 DIAGNOSIS — G360 Neuromyelitis optica [Devic]: Secondary | ICD-10-CM | POA: Diagnosis not present

## 2018-05-07 DIAGNOSIS — G360 Neuromyelitis optica [Devic]: Secondary | ICD-10-CM | POA: Diagnosis not present

## 2018-06-09 NOTE — Progress Notes (Signed)
Cardiology Office Note Date:  06/10/2018  Patient ID:  Matthew Juarez, DOB 12/01/1960, MRN [id number] PCP:  Glori LuisSonnenberg, Eric G, MD  Cardiologist:  Dr. Okey DupreEnd, MD    Chief Complaint: Follow up  History of Present Illness: Matthew Juarez is a 58 y.o. male with history of cardiac arrest secondary to massive PE in 05/2012 on Xarelto, paroxysmal SVT, transverse myelitis, prediabetes, hyperlipidemia and obesity who presents for follow-up of paroxysmal SVT.  Patient was seen in the early months of 03/2016 for palpitations with subsequent outpatient cardiac monitoring demonstrating predominant rhythm of sinus with an average rate of 66 bpm with a range of 45 to 119 bpm.  Rare isolated PACs, atrial couplets, and atrial triplets were identified.  Rare isolated PVCs and ventricular couplets were also noted.  Brief runs of ventricular bigeminy occurred.  There was a single episode of SVT lasting 7 beats with a maximal rate of 169 bpm.  No sustained arrhythmias or prolonged pauses were noted.  Patient triggered events corresponded to sinus rhythm and sinus rhythm with artifact.  Echo in 05/2016 showed an EF of 60 to 65%, no regional wall motion rise, grade 1 diastolic dysfunction, normal size left atrium, RV systolic function normal with possible mild RV enlargement on select images, unable to accurately estimate PASP.  Since that time, patient reports he has been doing well from a cardiac standpoint. He denies any further palpitations or feelings of racing heart rate since his episode in 05/2016.  Per Dr. Serita KyleEnd's recommendation, he reportedly has cut out caffeine with improvement in symptoms that has continued with abstaining from caffeinated beverages.  He denied any chest pain or SOB at rest or with exertion. He uses a motorized wheelchair outside of the home and a walker around the house. He denied any recent falls, syncope, or feelings of pre-syncope. He has chronic bilateral lower extremity edema but stated that it  has remained stable for many years and is adequately controlled with his compression stockings, which he wears daily. He uses two pillows at night "for comfort," which has been stable for many years. No symptoms of early satiety. He reported that he lifts weights several times weeklyfor physical activity and has not had any DOE or chest pain with this activity. He eats salads and reported a low salt diet with occasional sweets once or twice a week. He owns a blood pressure cuff but does not take his blood pressure at home. He reported medication compliance with his Lipitor, and he denied any BRBPR, melena, or hematuria on his Xarelto.   Labs: 09/2017 -LDL 70, T bili 1.5, AST/ALT normal, A1c 5.9 03/2018: [facility] Labs - K+ 3.8, SCr 1.0    Past Medical History:  Diagnosis Date  . Arthritis   . Cardiac arrest (HCC) 06/23/2012  . Chronic back pain   . Gout   . Hammer toe   . Headache(784.0)   . History of blood clots   . Hypertension   . Pulmonary embolism (HCC)   . Transverse myelitis [facility](HCC)     Past Surgical History:  Procedure Laterality Date  . HAMMER TOE SURGERY     2016    Current Meds  Medication Sig  . acetaminophen (TYLENOL) 500 MG tablet Take 500 mg by mouth every 6 (six) hours as needed for mild pain.  Marland Kitchen. allopurinol (ZYLOPRIM) 300 MG tablet TAKE 1 TABLET BY MOUTH EVERY DAY  . atorvastatin (LIPITOR) 40 MG tablet TAKE 1 TABLET BY MOUTH EVERY DAY  .  Cholecalciferol (VITAMIN D) 2000 UNITS CAPS Take 2,000 Units by mouth daily.  . Clobetasol Propionate 0.05 % lotion APPLY TO AFFECTED AREA TWICE A DAY AS NEEDED  . Coenzyme Q10 (CO Q-10) 200 MG CAPS Take 1 capsule by mouth daily.  Marland Kitchen desonide (DESOWEN) 0.05 % cream APPLY TO AFECTED AREA TWICE A DAY AS NEEDED  . esomeprazole (NEXIUM) 20 MG capsule Take 1 capsule (20 mg total) by mouth daily before breakfast.  . Fluocinolone Acetonide Scalp 0.01 % OIL APPLY TO SCALP AT BEDTIME, WASH OUT WHEN WAKE UP  . gabapentin (NEURONTIN) 600  MG tablet Take 600 mg by mouth 2 (two) times daily.  Marland Kitchen ketoconazole (NIZORAL) 2 % cream Apply 1 application topically daily.  . Oxycodone HCl 10 MG TABS Take by mouth.  Carlena Hurl 20 MG TABS tablet TAKE 1 TABLET (20 MG TOTAL) BY MOUTH DAILY WITH SUPPER.    Allergies:   Patient has no known allergies.   Social History:  The patient  reports that he quit smoking about 35 years ago. His smoking use included cigarettes. He quit after 1.00 year of use. He has never used smokeless tobacco. He reports that he does not drink alcohol or use drugs.   Family History:  The patient's family history includes Colon cancer in his maternal aunt; Dementia in his mother; Lung cancer in his maternal aunt and maternal uncle.  ROS:   Review of Systems  Constitutional: Negative for diaphoresis and malaise/fatigue.  HENT: Negative for congestion.   Respiratory: Negative for cough, hemoptysis, sputum production, shortness of breath and wheezing.   Cardiovascular: Positive for leg swelling. Negative for chest pain, palpitations, orthopnea, claudication and PND.       Chronic LEE, controlled by compression stockings. 2 pillows at night "for comfort."  Gastrointestinal: Negative for blood in stool, constipation, diarrhea, heartburn and melena.  Genitourinary: Negative for hematuria.  Musculoskeletal: Negative for falls.  Skin: Negative for rash.  Neurological: Negative for dizziness, focal weakness, loss of consciousness and weakness.  Endo/Heme/Allergies: Does not bruise/bleed easily.  Psychiatric/Behavioral: The patient is not nervous/anxious.   All other systems reviewed and are negative.    PHYSICAL EXAM:  VS:  BP 140/78 (BP Location: Right Arm, Patient Position: Sitting, Cuff Size: Large)   Pulse 62   Ht 6\' 5"  (1.956 m)   Wt (!) 318 lb 4 oz (144.4 kg)   BMI 37.74 kg/m  BMI: Body mass index is 37.74 kg/m.  Physical Exam  Constitutional: He is oriented to person, place, and time. He appears  well-developed and well-nourished. No distress.  HENT:  Head: Normocephalic and atraumatic.  Eyes: Right eye exhibits no discharge. Left eye exhibits no discharge.  Neck: Normal range of motion. No JVD present.  Cardiovascular: Normal rate, regular rhythm, S1 normal, S2 normal, normal heart sounds and normal pulses.  Occasional extrasystoles are present. Exam reveals no gallop, no distant heart sounds, no friction rub, no midsystolic click and no opening snap.  No murmur heard. Pulses:      Posterior tibial pulses are 2+ on the right side and 2+ on the left side.  Pulmonary/Chest: Effort normal and breath sounds normal. No respiratory distress. He has no decreased breath sounds. He has no wheezes. He has no rales. He exhibits no tenderness.  Abdominal: Soft. He exhibits no distension. There is no abdominal tenderness.  Musculoskeletal:        General: Edema present. No tenderness.     Comments: Trace non-pitting edema, compression stockings  Neurological:  He is alert and oriented to person, place, and time.  Skin: Skin is warm and dry. No rash noted. He is not diaphoretic. No cyanosis. No pallor. Nails show no clubbing.  Psychiatric: He has a normal mood and affect. His speech is normal and behavior is normal. Judgment and thought content normal.     EKG:  Was ordered and interpreted by me today. Shows sinus rhythm, occasional PVCs, 62 bpm.  Unchanged from prior.  Recent Labs: 10/26/2017: ALT 20; BUN 17; Creatinine, Ser 0.98; Potassium 4.5; Sodium 143  10/26/2017: Cholesterol 131; HDL 42.70; LDL Cholesterol 70; Total CHOL/HDL Ratio 3; Triglycerides 91.0; VLDL 18.2   CrCl cannot be calculated (Patient's most recent lab result is older than the maximum 21 days allowed.).   Wt Readings from Last 3 Encounters:  06/10/18 (!) 318 lb 4 oz (144.4 kg)  02/09/18 (!) 312 lb 6.4 oz (141.7 kg)  11/25/17 (!) 312 lb 3.2 oz (141.6 kg)     Other studies reviewed: Additional studies/records reviewed  today include: summarized above  ASSESSMENT AND PLAN:  1. Palpitations/pSVT/PVCs: EKG today shows occasional PVCs.  Patient denies any symptoms with PVCs as noted on EKG today.  No reported feelings of further racing heart rate or palpitations since cutting out caffeine / since his 2018 episode, and for which he wore a heart monitor as above in HPI.  Given his rare asymptomatic PVCs, no indication for initiation of pharmacologic therapy at this time.  He is aware that he can contact our office if he becomes symptomatic in the future.  If SVT returns or becomes more frequent in the future, could consider ablation; especially in the setting of baseline sinus rate in the low 60s bpm, which limits addition of rate controlling therapies.  2. History of pulmonary embolism: Massive PE in 2014. Echo in 2010 was limited for evaluation of the RV.  Questionable mild enlargement of the right ventricle but otherwise EF normal.  Chronic mild lower extremity edema controlled by compression stockings.  Otherwise, no symptoms indicative of significant right heart failure.  Continue Xarelto 20 mg for lifelong anticoagulation in this setting as managed by PCP.  3. Coronary artery calcification: Chest CT in 2016 showed mild CAC.  No symptoms of obstructive CAD, though mobility limited by transverse myelitis.  No angina at rest or with exertion, though functional status is somewhat limited, making assessment difficult.  Continue primary prevention with atorvastatin 40 mg daily.  No aspirin, given patient is on Xarelto.  As below, recommendations for elevated blood pressure at office visit today. Patient reports a heart healthy diet with occasional sweets and consistent physical activity / weights. Continue statin and Xarelto.  4. Elevated blood pressure: Blood pressure mildly elevated today with SBP 140.  Recent BP measurements in Epic have been well controlled to on the soft side.  Patient will use his home blood pressure  cuff and take twice daily blood pressure readings for 2 weeks and to send to the office.  If his blood pressure is often elevated at home in the 140s, will plan to start him on antihypertensive medication for risk factor modification.  Pending these BP measurements, no additional medications at this time.    Disposition: F/u with Dr. Okey Dupre or an APP in 12 months. Send in twice daily BP readings in 14 days.   Current medicines are reviewed at length with the patient today.  The patient did not have any concerns regarding medicines.  SignedEula Listen, PA-C 06/10/2018 3:25  PM     [address] [place], [place] [id number] [phone]

## 2018-06-10 ENCOUNTER — Other Ambulatory Visit: Payer: Self-pay

## 2018-06-10 ENCOUNTER — Encounter: Payer: Self-pay | Admitting: Physician Assistant

## 2018-06-10 ENCOUNTER — Ambulatory Visit (INDEPENDENT_AMBULATORY_CARE_PROVIDER_SITE_OTHER): Payer: Medicare Other | Admitting: Physician Assistant

## 2018-06-10 VITALS — BP 140/78 | HR 62 | Ht 77.0 in | Wt 318.2 lb

## 2018-06-10 DIAGNOSIS — R002 Palpitations: Secondary | ICD-10-CM | POA: Diagnosis not present

## 2018-06-10 DIAGNOSIS — I493 Ventricular premature depolarization: Secondary | ICD-10-CM

## 2018-06-10 DIAGNOSIS — I471 Supraventricular tachycardia: Secondary | ICD-10-CM

## 2018-06-10 DIAGNOSIS — I2584 Coronary atherosclerosis due to calcified coronary lesion: Secondary | ICD-10-CM

## 2018-06-10 DIAGNOSIS — R030 Elevated blood-pressure reading, without diagnosis of hypertension: Secondary | ICD-10-CM | POA: Diagnosis not present

## 2018-06-10 DIAGNOSIS — I2510 Atherosclerotic heart disease of native coronary artery without angina pectoris: Secondary | ICD-10-CM

## 2018-06-10 DIAGNOSIS — Z86711 Personal history of pulmonary embolism: Secondary | ICD-10-CM | POA: Diagnosis not present

## 2018-06-10 NOTE — Patient Instructions (Addendum)
Medication Instructions:  Your physician recommends that you continue on your current medications as directed. Please refer to the Current Medication list given to you today.  If you need a refill on your cardiac medications before your next appointment, please call your pharmacy.   Lab work: None ordered If you have labs (blood work) drawn today and your tests are completely normal, you will receive your results only by: Marland Kitchen MyChart Message (if you have MyChart) OR . A paper copy in the mail If you have any lab test that is abnormal or we need to change your treatment, we will call you to review the results.  Testing/Procedures: None ordered  Follow-Up: At [facility], you and your health needs are our priority.  As part of our continuing mission to provide you with exceptional heart care, we have created designated Provider Care Teams.  These Care Teams include your primary Cardiologist (physician) and Advanced Practice Providers (APPs -  Physician Assistants and Nurse Practitioners) who all work together to provide you with the care you need, when you need it. You will need a follow up appointment in 12 months.  Please call our office 2 months in advance to schedule this appointment.  You may see Dr.End or one of the following Advanced Practice Providers on your designated Care Team:   Nicolasa Ducking, NP Eula Listen, PA-C . Marisue Ivan, PA-C  Your physician has requested that you regularly monitor and record your blood pressure readings at home for 2 weeks. Please use the same machine at the same time of day to check your readings and record them.  Please call the office to provide the readings.

## 2018-06-11 ENCOUNTER — Ambulatory Visit: Payer: Medicare Other | Admitting: Family Medicine

## 2018-06-14 DIAGNOSIS — G8929 Other chronic pain: Secondary | ICD-10-CM | POA: Diagnosis not present

## 2018-06-14 DIAGNOSIS — M25512 Pain in left shoulder: Secondary | ICD-10-CM | POA: Diagnosis not present

## 2018-06-14 DIAGNOSIS — G894 Chronic pain syndrome: Secondary | ICD-10-CM | POA: Diagnosis not present

## 2018-06-14 DIAGNOSIS — Z79891 Long term (current) use of opiate analgesic: Secondary | ICD-10-CM | POA: Diagnosis not present

## 2018-06-14 DIAGNOSIS — G373 Acute transverse myelitis in demyelinating disease of central nervous system: Secondary | ICD-10-CM | POA: Diagnosis not present

## 2018-06-21 ENCOUNTER — Telehealth: Payer: Self-pay | Admitting: Internal Medicine

## 2018-06-21 NOTE — Telephone Encounter (Signed)
Patient calling with BP readings requested by R. Dunn - these are the last 5 days  3/17  morning 118/67 evening 132/8 3/18 morning 133/69 evening 108/69 3/19  morning 108/62 evening 120/68 3/20 morning 127/73 evening 125/67 3/21  morning 113/63 evening 120/75  3/22  morning 114/62

## 2018-06-21 NOTE — Telephone Encounter (Signed)
Blood pressure is reasonably controlled. No changes.

## 2018-06-21 NOTE — Telephone Encounter (Signed)
To Ryan Dunn, PA to review.  

## 2018-06-22 NOTE — Telephone Encounter (Signed)
No answer. Left detail message with recommendations that blood pressure reasonable controlled and no changes, ok per DPR, and to call back if any questions.

## 2018-06-29 ENCOUNTER — Ambulatory Visit (INDEPENDENT_AMBULATORY_CARE_PROVIDER_SITE_OTHER): Payer: Medicare Other | Admitting: Family Medicine

## 2018-06-29 ENCOUNTER — Encounter: Payer: Self-pay | Admitting: Family Medicine

## 2018-06-29 ENCOUNTER — Telehealth: Payer: Self-pay

## 2018-06-29 ENCOUNTER — Telehealth: Payer: Self-pay | Admitting: Family Medicine

## 2018-06-29 ENCOUNTER — Other Ambulatory Visit: Payer: Self-pay

## 2018-06-29 DIAGNOSIS — Z86711 Personal history of pulmonary embolism: Secondary | ICD-10-CM

## 2018-06-29 DIAGNOSIS — R7303 Prediabetes: Secondary | ICD-10-CM | POA: Diagnosis not present

## 2018-06-29 DIAGNOSIS — E785 Hyperlipidemia, unspecified: Secondary | ICD-10-CM

## 2018-06-29 NOTE — Telephone Encounter (Signed)
Copied from CRM [id number]. Topic: General - Other >> Jun 29, 2018  9:12 AM Debroah Loop wrote: Reason for CRM: Patient returning call from [place]. No notes in chart.

## 2018-06-29 NOTE — Progress Notes (Signed)
Virtual Visit via Video Note  This visit type was conducted due to national recommendations for restrictions regarding the COVID-19 pandemic (e.g. social distancing).  This format is felt to be most appropriate for this patient at this time.  All issues noted in this document were discussed and addressed.  No physical exam was performed (except for noted visual exam findings with Video Visits).    I connected with Matthew Juarez on 06/29/18 at 10:00 AM EDT by a video enabled telemedicine application and verified that I am speaking with the correct person using two identifiers. Location patient: home Location provider: work  Persons participating in the virtual visit: patient, provider  I discussed the limitations of evaluation and management by telemedicine and the availability of in person appointments. The patient expressed understanding and agreed to proceed.  Reason for visit: 2-month follow-up.  HPI: History of PE: Patient has been maintained on Xarelto and has not had any issues with this medication.  He has had no recurrence of PE symptoms since going on this medication.  He notes no bleeding issues.  No new leg swelling.  Hyperlipidemia: Taking Lipitor.  No chest pain or shortness of breath.  No right upper quadrant pain.  No myalgias.  Prediabetes: No polyuria or polydipsia.  He has been exercising with walking and doing his arm and leg exercises 3 days a week.  He notes his diet is been bad lately.  He has been eating sweets though not daily.  Having a diet soda twice weekly.  Has been eating fried foods as well.   ROS: See pertinent positives and negatives per HPI.  Past Medical History:  Diagnosis Date  . Arthritis   . Cardiac arrest (HCC) 06/23/2012  . Chronic back pain   . Gout   . Hammer toe   . Headache(784.0)   . History of blood clots   . Hypertension   . Pulmonary embolism (HCC)   . Transverse myelitis [facility])     Past Surgical History:  Procedure Laterality Date   . HAMMER TOE SURGERY     2016    Family History  Problem Relation Age of Onset  . Dementia Mother   . Lung cancer Maternal Uncle   . Colon cancer Maternal Aunt   . Lung cancer Maternal Aunt   . Heart disease Neg Hx     SOCIAL HX: Former smoker   Current Outpatient Medications:  .  acetaminophen (TYLENOL) 500 MG tablet, Take 500 mg by mouth every 6 (six) hours as needed for mild pain., Disp: , Rfl:  .  allopurinol (ZYLOPRIM) 300 MG tablet, TAKE 1 TABLET BY MOUTH EVERY DAY, Disp: 90 tablet, Rfl: 3 .  atorvastatin (LIPITOR) 40 MG tablet, TAKE 1 TABLET BY MOUTH EVERY DAY, Disp: 90 tablet, Rfl: 3 .  Cholecalciferol (VITAMIN D) 2000 UNITS CAPS, Take 2,000 Units by mouth daily., Disp: , Rfl:  .  Clobetasol Propionate 0.05 % lotion, APPLY TO AFFECTED AREA TWICE A DAY AS NEEDED, Disp: , Rfl:  .  Coenzyme Q10 (CO Q-10) 200 MG CAPS, Take 1 capsule by mouth daily., Disp: 30 each, Rfl: 1 .  desonide (DESOWEN) 0.05 % cream, APPLY TO AFECTED AREA TWICE A DAY AS NEEDED, Disp: , Rfl: 3 .  esomeprazole (NEXIUM) 20 MG capsule, Take 1 capsule (20 mg total) by mouth daily before breakfast., Disp: 90 capsule, Rfl: 3 .  Fluocinolone Acetonide Scalp 0.01 % OIL, APPLY TO SCALP AT BEDTIME, WASH OUT WHEN WAKE UP, Disp: , Rfl:  3 .  gabapentin (NEURONTIN) 600 MG tablet, Take 600 mg by mouth 2 (two) times daily., Disp: , Rfl:  .  ketoconazole (NIZORAL) 2 % cream, Apply 1 application topically daily., Disp: 15 g, Rfl: 0 .  Oxycodone HCl 10 MG TABS, Take by mouth., Disp: , Rfl:  .  XARELTO 20 MG TABS tablet, TAKE 1 TABLET (20 MG TOTAL) BY MOUTH DAILY WITH SUPPER., Disp: 90 tablet, Rfl: 3  EXAM:  VITALS per patient if applicable: None.  GENERAL: alert, oriented, appears well and in no acute distress  HEENT: atraumatic, conjunttiva clear, no obvious abnormalities on inspection of external nose and ears  NECK: normal movements of the head and neck  LUNGS: on inspection no signs of respiratory distress,  breathing rate appears normal, no obvious gross SOB, gasping or wheezing  CV: no obvious cyanosis  MS: moves all visible extremities without noticeable abnormality  PSYCH/NEURO: pleasant and cooperative, no obvious depression or anxiety, speech and thought processing grossly intact  ASSESSMENT AND PLAN:  Discussed the following assessment and plan:  History of pulmonary embolism  Hyperlipidemia, unspecified hyperlipidemia type  Prediabetes  History of pulmonary embolism No recurrent symptoms.  He will continue on Xarelto.  Hyperlipidemia Continue on Lipitor.  Plan to recheck labs in July.  Prediabetes Most recent A1c well controlled.  Plan to check in July with lab work.  Encouraged continued exercise.  Discussed decreasing sweets and fried food intake to twice weekly.     I discussed the assessment and treatment plan with the patient. The patient was provided an opportunity to ask questions and all were answered. The patient agreed with the plan and demonstrated an understanding of the instructions.   The patient was advised to call back or seek an in-person evaluation if the symptoms worsen or if the condition fails to improve as anticipated.    [name], MD

## 2018-06-29 NOTE — Assessment & Plan Note (Signed)
No recurrent symptoms.  He will continue on Xarelto.

## 2018-06-29 NOTE — Assessment & Plan Note (Signed)
Most recent A1c well controlled.  Plan to check in July with lab work.  Encouraged continued exercise.  Discussed decreasing sweets and fried food intake to twice weekly.

## 2018-06-29 NOTE — Assessment & Plan Note (Signed)
Continue on Lipitor.  Plan to recheck labs in July.

## 2018-06-29 NOTE — Telephone Encounter (Signed)
Called and spoke with pt. Pt has been scheduled for an appt for July 31,2020 @ 1:15 PM.

## 2018-06-29 NOTE — Telephone Encounter (Signed)
Please contact the patient and get him set up for follow-up at the end of July.  Thanks.

## 2018-06-29 NOTE — Telephone Encounter (Signed)
Called pt and left a VM to call back to schedule for an appt. CRM created and sent to [facility] pool.

## 2018-07-26 DIAGNOSIS — G360 Neuromyelitis optica [Devic]: Secondary | ICD-10-CM | POA: Diagnosis not present

## 2018-07-26 DIAGNOSIS — S82309A Unspecified fracture of lower end of unspecified tibia, initial encounter for closed fracture: Secondary | ICD-10-CM | POA: Diagnosis not present

## 2018-07-26 DIAGNOSIS — G8220 Paraplegia, unspecified: Secondary | ICD-10-CM | POA: Diagnosis not present

## 2018-07-26 DIAGNOSIS — R269 Unspecified abnormalities of gait and mobility: Secondary | ICD-10-CM | POA: Diagnosis not present

## 2018-08-10 DIAGNOSIS — G360 Neuromyelitis optica [Devic]: Secondary | ICD-10-CM | POA: Diagnosis not present

## 2018-09-13 DIAGNOSIS — G8929 Other chronic pain: Secondary | ICD-10-CM | POA: Diagnosis not present

## 2018-09-13 DIAGNOSIS — M25512 Pain in left shoulder: Secondary | ICD-10-CM | POA: Diagnosis not present

## 2018-09-13 DIAGNOSIS — G894 Chronic pain syndrome: Secondary | ICD-10-CM | POA: Diagnosis not present

## 2018-09-13 DIAGNOSIS — Z79891 Long term (current) use of opiate analgesic: Secondary | ICD-10-CM | POA: Diagnosis not present

## 2018-09-13 DIAGNOSIS — G373 Acute transverse myelitis in demyelinating disease of central nervous system: Secondary | ICD-10-CM | POA: Diagnosis not present

## 2018-09-20 ENCOUNTER — Ambulatory Visit (INDEPENDENT_AMBULATORY_CARE_PROVIDER_SITE_OTHER): Payer: Medicare Other

## 2018-09-20 ENCOUNTER — Ambulatory Visit: Payer: Medicare Other | Admitting: Family Medicine

## 2018-09-20 ENCOUNTER — Ambulatory Visit (INDEPENDENT_AMBULATORY_CARE_PROVIDER_SITE_OTHER): Payer: Medicare Other | Admitting: Family Medicine

## 2018-09-20 ENCOUNTER — Encounter: Payer: Self-pay | Admitting: Family Medicine

## 2018-09-20 ENCOUNTER — Other Ambulatory Visit: Payer: Self-pay

## 2018-09-20 DIAGNOSIS — G360 Neuromyelitis optica [Devic]: Secondary | ICD-10-CM | POA: Diagnosis not present

## 2018-09-20 DIAGNOSIS — K219 Gastro-esophageal reflux disease without esophagitis: Secondary | ICD-10-CM | POA: Diagnosis not present

## 2018-09-20 DIAGNOSIS — E785 Hyperlipidemia, unspecified: Secondary | ICD-10-CM | POA: Diagnosis not present

## 2018-09-20 DIAGNOSIS — Z125 Encounter for screening for malignant neoplasm of prostate: Secondary | ICD-10-CM

## 2018-09-20 DIAGNOSIS — Z86711 Personal history of pulmonary embolism: Secondary | ICD-10-CM

## 2018-09-20 DIAGNOSIS — Z0000 Encounter for general adult medical examination without abnormal findings: Secondary | ICD-10-CM | POA: Diagnosis not present

## 2018-09-20 DIAGNOSIS — Z8679 Personal history of other diseases of the circulatory system: Secondary | ICD-10-CM

## 2018-09-20 DIAGNOSIS — M1A0710 Idiopathic chronic gout, right ankle and foot, without tophus (tophi): Secondary | ICD-10-CM

## 2018-09-20 DIAGNOSIS — R7303 Prediabetes: Secondary | ICD-10-CM

## 2018-09-20 NOTE — Assessment & Plan Note (Signed)
Continue Xarelto.  Check CBC.

## 2018-09-20 NOTE — Assessment & Plan Note (Signed)
Check uric acid. 

## 2018-09-20 NOTE — Progress Notes (Signed)
Subjective:   Matthew Juarez is a 58 y.o. male who presents for Medicare Annual/Subsequent preventive examination.  Review of Systems:  No ROS.  Medicare Wellness Virtual Visit.  Visual/audio telehealth visit, UTA vital signs.   See social history for additional risk factors.  Cardiac Risk Factors include: male gender;sedentary lifestyle;hypertension     Objective:    Vitals: There were no vitals taken for this visit.  There is no height or weight on file to calculate BMI.  Advanced Directives 09/20/2018 09/09/2017 08/21/2016 12/22/2014 06/23/2012 04/17/2012  Does Patient Have a Medical Advance Directive? No No No No Patient does not have advance directive Patient does not have advance directive;Patient would not like information  Would patient like information on creating a medical advance directive? No - Patient declined Yes (MAU/Ambulatory/Procedural Areas - Information given) Yes (MAU/Ambulatory/Procedural Areas - Information given) - - -  Pre-existing out of facility DNR order (yellow form or pink MOST form) - - - - No No    Tobacco Social History   Tobacco Use  Smoking Status Former Smoker  . Years: 1.00  . Types: Cigarettes  . Quit date: 04/01/1983  . Years since quitting: 35.5  Smokeless Tobacco Never Used  Tobacco Comment   only snmoke 2- cigsper day when he smoked     Counseling given: Not Answered Comment: only snmoke 2- cigsper day when he smoked   Clinical Intake:  Pre-visit preparation completed: Yes        Diabetes: No  How often do you need to have someone help you when you read instructions, pamphlets, or other written materials from your doctor or pharmacy?: 1 - Never  Interpreter Needed?: No     Past Medical History:  Diagnosis Date  . Arthritis   . Cardiac arrest ([place]) 06/23/2012  . Chronic back pain   . Gout   . Hammer toe   . Headache(784.0)   . History of blood clots   . Hypertension   . Pulmonary embolism ([place])   . Transverse  myelitis [facility])    Past Surgical History:  Procedure Laterality Date  . HAMMER TOE SURGERY     2016   Family History  Problem Relation Age of Onset  . Dementia Mother   . Lung cancer Maternal Uncle   . Colon cancer Maternal Aunt   . Lung cancer Maternal Aunt   . Heart disease Neg Hx    Social History   Socioeconomic History  . Marital status: Married    Spouse name: Not on file  . Number of children: Not on file  . Years of education: Not on file  . Highest education level: Not on file  Occupational History  . Occupation: disability  Social Needs  . Financial resource strain: Not hard at all  . Food insecurity    Worry: Never true    Inability: Never true  . Transportation needs    Medical: No    Non-medical: No  Tobacco Use  . Smoking status: Former Smoker    Years: 1.00    Types: Cigarettes    Quit date: 04/01/1983    Years since quitting: 35.5  . Smokeless tobacco: Never Used  . Tobacco comment: only snmoke 2- cigsper day when he smoked  Substance and Sexual Activity  . Alcohol use: No    Alcohol/week: 0.0 standard drinks  . Drug use: No  . Sexual activity: Not Currently  Lifestyle  . Physical activity    Days per week: 5 days  Minutes per session: 30 min  . Stress: Not at all  Relationships  . Social Musicianconnections    Talks on phone: Not on file    Gets together: Not on file    Attends religious service: Not on file    Active member of club or organization: Not on file    Attends meetings of clubs or organizations: Not on file    Relationship status: Not on file  Other Topics Concern  . Not on file  Social History Narrative  . Not on file    Outpatient Encounter Medications as of 09/20/2018  Medication Sig  . acetaminophen (TYLENOL) 500 MG tablet Take 500 mg by mouth every 6 (six) hours as needed for mild pain.  Marland Kitchen. allopurinol (ZYLOPRIM) 300 MG tablet TAKE 1 TABLET BY MOUTH EVERY DAY  . atorvastatin (LIPITOR) 40 MG tablet TAKE 1 TABLET BY MOUTH EVERY  DAY  . Cholecalciferol (VITAMIN D) 2000 UNITS CAPS Take 2,000 Units by mouth daily.  . Clobetasol Propionate 0.05 % lotion APPLY TO AFFECTED AREA TWICE A DAY AS NEEDED  . Coenzyme Q10 (CO Q-10) 200 MG CAPS Take 1 capsule by mouth daily.  Marland Kitchen. desonide (DESOWEN) 0.05 % cream APPLY TO AFECTED AREA TWICE A DAY AS NEEDED  . esomeprazole (NEXIUM) 20 MG capsule Take 1 capsule (20 mg total) by mouth daily before breakfast.  . Fluocinolone Acetonide Scalp 0.01 % OIL APPLY TO SCALP AT BEDTIME, WASH OUT WHEN WAKE UP  . gabapentin (NEURONTIN) 600 MG tablet Take 600 mg by mouth 2 (two) times daily.  Marland Kitchen. ketoconazole (NIZORAL) 2 % cream Apply 1 application topically daily.  . Oxycodone HCl 10 MG TABS Take by mouth.  Carlena Hurl. XARELTO 20 MG TABS tablet TAKE 1 TABLET (20 MG TOTAL) BY MOUTH DAILY WITH SUPPER.   No facility-administered encounter medications on file as of 09/20/2018.     Activities of Daily Living In your present state of health, do you have any difficulty performing the following activities: 09/20/2018  Hearing? N  Vision? N  Difficulty concentrating or making decisions? N  Walking or climbing stairs? Y  Comment ambulates with walker and wheelchair  Dressing or bathing? N  Doing errands, shopping? 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 N  Using the Toilet? N  In the past six months, have you accidently leaked urine? N  Do you have problems with loss of bowel control? N  Managing your Medications? N  Managing your Finances? N  Housekeeping or managing your Housekeeping? N  Some recent data might be hidden    Patient Care Team: Glori LuisSonnenberg, Eric G, MD as PCP - General (Family Medicine)   Assessment:   This is a routine wellness examination for Matthew Juarez.  I connected with patient 09/29/18 at  9:00 AM EDT by an audio enabled telemedicine application and verified that I am speaking with the correct person using two identifiers. Patient stated full name and DOB. Patient gave  permission to continue with virtual visit. Patient's location was at home and Nurse's location was at [facility] office.   Health Screenings  Colonoscopy - 01/2010 Labs followed by pcp. Glaucoma -none Hearing -demonstrates normal hearing during visit. Dental- UTD Vision- visits within the last 12 months.  Social  Alcohol intake - no    Smoking history- former  Smokers in home? none Illicit drug use? none Exercise - daily wheelchair exercises and household chores, 5 days weekly, 30 minutes Diet - low cholesterol Sexually Active -  not currently BMI- discussed the importance of a healthy diet, water intake and the benefits of aerobic exercise.  Educational material provided.   Safety  Patient feels safe at home- yes Patient does have smoke detectors at home- yes Patient does wear sunscreen or protective clothing when in direct sunlight -yes Patient does wear seat belt when in a moving vehicle -yes Patient drives- no  Covid-19 precautions and sickness symptoms discussed.   Activities of Daily Living Patient is able to do:: household chores, feeding themselves, getting from bed to chair, getting to the toilet, bathing/showering, dressing, managing money, or preparing meals. Wife assists if anything is needed but he is capable in these areas.   Depression Screen Patient denies losing interest in daily life, feeling hopeless, or crying easily over simple problems.   Medication-taking as directed and without issues.   Fall Screen Patient denies being afraid of falling or falling in the last year.   Memory Screen Patient is alert.  Patient denies difficulty focusing, concentrating or misplacing items. Correctly identified the president of the [place], season and recall. Patient likes to read for brain stimulation.  Immunizations The following Immunizations were discussed: Influenza, shingles, pneumonia, and tetanus.   Other Providers Patient Care Team: Glori LuisSonnenberg, Eric G, MD as PCP  - General (Family Medicine)  Exercise Activities and Dietary recommendations Current Exercise Habits: Home exercise routine, Type of exercise: strength training/weights(Wheelchair PT exercises), Intensity: Mild  Goals      Patient Stated   . Weight (lb) < 250 lb (113.4 kg) (pt-stated)     Eat healthy diet; less fried and sugared foods Maintain exercise regimen       Fall Risk Fall Risk  09/20/2018 09/09/2017 08/21/2016  Falls in the past year? 0 No No   Depression Screen PHQ 2/9 Scores 09/20/2018 09/09/2017 08/21/2016 10/12/2015  PHQ - 2 Score 0 0 0 0  PHQ- 9 Score - - 0 -    Cognitive Function MMSE - Mini Mental State Exam 09/09/2017 08/21/2016  Orientation to time 5 5  Orientation to Place 5 5  Registration 3 3  Attention/ Calculation 5 5  Recall 3 3  Language- name 2 objects 2 2  Language- repeat 1 1  Language- follow 3 step command 3 3  Language- read & follow direction 1 1  Write a sentence 1 1  Copy design 1 1  Total score 30 30     6CIT Screen 09/20/2018  What Year? 0 points  What month? 0 points  What time? 0 points  Count back from 20 0 points  Months in reverse 0 points    Immunization History  Administered Date(s) Administered  . Hepatitis A, Adult 11/12/2017  . Influenza Split 01/30/2012  . Influenza,inj,Quad PF,6+ Mos 12/29/2013, 01/16/2016, 04/27/2017, 02/09/2018  . Influenza-Unspecified 02/19/2012, 01/01/2014  . PPD Test 12/05/2011   Screening Tests Health Maintenance  Topic Date Due  . INFLUENZA VACCINE  10/30/2018  . COLONOSCOPY  01/30/2020  . TETANUS/TDAP  04/01/2021  . Hepatitis C Screening  Completed  . HIV Screening  Completed      Plan:   End of life planning; Advanced aging; Advanced directives discussed.  No HCPOA/Living Will.  Additional information declined at this time.  I have personally reviewed and noted the following in the patient's chart:   . Medical and social history . Use of alcohol, tobacco or illicit drugs  .  Current medications and supplements . Functional ability and status . Nutritional status . Physical activity .  Advanced directives . List of other physicians . Hospitalizations, surgeries, and ER visits in previous 12 months . Vitals . Screenings to include cognitive, depression, and falls . Referrals and appointments  In addition, I have reviewed and discussed with patient certain preventive protocols, quality metrics, and best practice recommendations. A written personalized care plan for preventive services as well as general preventive health recommendations were provided to patient.     Ashok PallOBrien-Blaney, Lucille Witts L, LPN  1/6/10967/03/2018

## 2018-09-20 NOTE — Assessment & Plan Note (Signed)
BP most recently well controlled at pain management.  Continue to periodically monitor BP.

## 2018-09-20 NOTE — Progress Notes (Signed)
Virtual Visit via telephone Note  This visit type was conducted due to national recommendations for restrictions regarding the COVID-19 pandemic (e.g. social distancing).  This format is felt to be most appropriate for this patient at this time.  All issues noted in this document were discussed and addressed.  No physical exam was performed (except for noted visual exam findings with Video Visits).   I connected with Matthew Juarez today at  8:30 AM EDT by telephone and verified that I am speaking with the correct person using two identifiers. Location patient: home Location provider: work  Persons participating in the virtual visit: patient, provider, Architectural technologist (wife)  I discussed the limitations, risks, security and privacy concerns of performing an evaluation and management service by telephone and the availability of in person appointments. I also discussed with the patient that there may be a patient responsible charge related to this service. The patient expressed understanding and agreed to proceed.  Interactive audio and video telecommunications were attempted between this provider and patient, however failed, due to patient having technical difficulties OR patient did not have access to video capability.  We continued and completed visit with audio only.   Reason for visit: follow-up  HPI: Hyperlipidemia: Taking Lipitor.  No chest pain, shortness of breath, right upper quadrant pain, or myalgias.  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  He underwent his Rituxan infusion and that went well.  No weakness or numbness that is not already chronic.  He sees neurology every 6 months.  Prediabetes: No polyuria or polydipsia.  He has been exercising 4 to 5 days a week with wheelchair exercises.  He feels quite a bit better.  He is eating fruits, vegetables, and grilled meats.  Drinking mostly water.  He has lost about 5 pounds.  GERD: Taking Nexium.  No reflux, blood in  the stool, abdominal pain, or dysphagia.  No symptoms with the Nexium.  History of PE: Currently on Xarelto lifelong.  No bleeding issues.  His chronic bilateral ankle edema is stable and well-controlled with use of compression stockings.  Patient's BP was up slightly at cardiology though he checked it consistently and it was well controlled at home.  Blood pressure was adequate at his pain management visit recently.  ROS: See pertinent positives and negatives per HPI.  Past Medical History:  Diagnosis Date  . Arthritis   . Cardiac arrest ([place]) 06/23/2012  . Chronic back pain   . Gout   . Hammer toe   . Headache(784.0)   . History of blood clots   . Hypertension   . Pulmonary embolism ([place])   . Transverse myelitis [facility])     Past Surgical History:  Procedure Laterality Date  . HAMMER TOE SURGERY     2016    Family History  Problem Relation Age of Onset  . Dementia Mother   . Lung cancer Maternal Uncle   . Colon cancer Maternal Aunt   . Lung cancer Maternal Aunt   . Heart disease Neg Hx     SOCIAL HX: Former smoker   Current Outpatient Medications:  .  acetaminophen (TYLENOL) 500 MG tablet, Take 500 mg by mouth every 6 (six) hours as needed for mild pain., Disp: , Rfl:  .  allopurinol (ZYLOPRIM) 300 MG tablet, TAKE 1 TABLET BY MOUTH EVERY DAY, Disp: 90 tablet, Rfl: 3 .  atorvastatin (LIPITOR) 40 MG tablet, TAKE 1 TABLET BY MOUTH EVERY DAY, Disp: 90 tablet, Rfl: 3 .  Cholecalciferol (VITAMIN D) 2000 UNITS CAPS, Take 2,000 Units by mouth daily., Disp: , Rfl:  .  Clobetasol Propionate 0.05 % lotion, APPLY TO AFFECTED AREA TWICE A DAY AS NEEDED, Disp: , Rfl:  .  Coenzyme Q10 (CO Q-10) 200 MG CAPS, Take 1 capsule by mouth daily., Disp: 30 each, Rfl: 1 .  desonide (DESOWEN) 0.05 % cream, APPLY TO AFECTED AREA TWICE A DAY AS NEEDED, Disp: , Rfl: 3 .  esomeprazole (NEXIUM) 20 MG capsule, Take 1 capsule (20 mg total) by mouth daily before breakfast., Disp: 90 capsule, Rfl: 3 .   Fluocinolone Acetonide Scalp 0.01 % OIL, APPLY TO SCALP AT BEDTIME, WASH OUT WHEN WAKE UP, Disp: , Rfl: 3 .  gabapentin (NEURONTIN) 600 MG tablet, Take 600 mg by mouth 2 (two) times daily., Disp: , Rfl:  .  ketoconazole (NIZORAL) 2 % cream, Apply 1 application topically daily., Disp: 15 g, Rfl: 0 .  Oxycodone HCl 10 MG TABS, Take by mouth., Disp: , Rfl:  .  XARELTO 20 MG TABS tablet, TAKE 1 TABLET (20 MG TOTAL) BY MOUTH DAILY WITH SUPPER., Disp: 90 tablet, Rfl: 3  EXAM: This was a telehealth telephone visit and thus no physical exam was completed.  ASSESSMENT AND PLAN:  Discussed the following assessment and plan:  GERD (gastroesophageal reflux disease) Well-controlled.  Continue Nexium.  Neuromyelitis optica (HCC) Stable.  He will continue to follow with neurology at [facility].  History of hypertension BP most recently well controlled at pain management.  Continue to periodically monitor BP.  History of pulmonary embolism Continue Xarelto.  Check CBC.  Hyperlipidemia Continue Lipitor.  Check lipid panel.  Prediabetes Continue diet and exercise.  Check A1c.  Gout Check uric acid.    I discussed the assessment and treatment plan with the patient. The patient was provided an opportunity to ask questions and all were answered. The patient agreed with the plan and demonstrated an understanding of the instructions.   The patient was advised to call back or seek an in-person evaluation if the symptoms worsen or if the condition fails to improve as anticipated.  I provided 11 minutes of non-face-to-face time during this encounter.   Marikay AlarEric Adhya Cocco, MD

## 2018-09-20 NOTE — Assessment & Plan Note (Signed)
Stable.  He will continue to follow with neurology at [facility]

## 2018-09-20 NOTE — Assessment & Plan Note (Signed)
Well-controlled.  Continue Nexium. 

## 2018-09-20 NOTE — Assessment & Plan Note (Signed)
-  Continue Lipitor °-Check lipid panel °

## 2018-09-20 NOTE — Assessment & Plan Note (Signed)
Continue diet and exercise.  Check A1c. 

## 2018-09-20 NOTE — Patient Instructions (Addendum)
  Matthew Juarez , Thank you for taking time to come for your Medicare Wellness Visit. I appreciate your ongoing commitment to your health goals. Please review the following plan we discussed and let me know if I can assist you in the future.   These are the goals we discussed: Goals      Patient Stated   . Weight (lb) < 250 lb (113.4 kg) (pt-stated)     Eat healthy diet; less fried and sugared foods Maintain exercise regimen       This is a list of the screening recommended for you and due dates:  Health Maintenance  Topic Date Due  . Flu Shot  10/30/2018  . Colon Cancer Screening  01/30/2020  . Tetanus Vaccine  04/01/2021  .  Hepatitis C: One time screening is recommended by Center for Disease Control  (CDC) for  adults born from [birth year] through 1967.   Completed  . HIV Screening  Completed

## 2018-09-21 NOTE — Progress Notes (Signed)
See other encounter for today.

## 2018-09-23 NOTE — Progress Notes (Signed)
I have reviewed the above note and agree.  Eric Sonnenberg, M.D.  

## 2018-10-04 ENCOUNTER — Other Ambulatory Visit: Payer: Self-pay

## 2018-10-05 ENCOUNTER — Other Ambulatory Visit: Payer: Self-pay

## 2018-10-05 ENCOUNTER — Ambulatory Visit (INDEPENDENT_AMBULATORY_CARE_PROVIDER_SITE_OTHER): Payer: Medicare Other | Admitting: Family Medicine

## 2018-10-05 ENCOUNTER — Encounter: Payer: Self-pay | Admitting: Family Medicine

## 2018-10-05 VITALS — BP 140/82 | HR 67 | Temp 98.3°F | Resp 18 | Ht 77.0 in | Wt 310.4 lb

## 2018-10-05 DIAGNOSIS — M7022 Olecranon bursitis, left elbow: Secondary | ICD-10-CM | POA: Diagnosis not present

## 2018-10-05 NOTE — Patient Instructions (Addendum)
How to help elbow improve:  Protecting the elbow by wearing elbow pads  Avoiding activities that put pressure on the elbow RICE (Rest, Ice, Compression, and Elevation) helps to get relief from pain and swelling and gives better flexibility  Use tylenol as needed for pain  Do arm stretching and range of motion exercises  If not improving in next few weeks, we will do orthopedic referral

## 2018-10-05 NOTE — Progress Notes (Signed)
Subjective:    Patient ID: Matthew Juarez, male    DOB: 20-Mar-1961, 58 y.o.   MRN: [id number]  HPI   Patient presents to clinic due to bump on left elbow.  States the area is been there a couple of weeks.  Denies any known injury or trauma to the elbow.  States it does not hurt.  Range of motion appears to be intact.  Area is not hot or red.  Denies any fever or chills.  Patient does get around in a motorized wheelchair, does rest his arms on a armrest of the chair throughout the day.   Patient Active Problem List   Diagnosis Date Noted  . Fatty liver 10/26/2017  . Chronic left shoulder pain 03/19/2017  . GERD (gastroesophageal reflux disease) 10/23/2016  . Obesity 06/30/2016  . Right heart failure ([place]) 04/30/2016  . Coronary artery calcification 04/30/2016  . Palpitations 04/14/2016  . Hyperlipidemia 01/16/2016  . Prediabetes 09/13/2015  . Gout 08/10/2015  . History of hypertension 08/10/2015  . [place] intertrigo 08/10/2015  . Chronic pain syndrome 07/11/2015  . Chronic, continuous use of opioids 05/05/2013  . History of pulmonary embolism 07/23/2012  . Pulmonary infarction ([place]) 07/23/2012  . Neuromyelitis optica ([place]) 04/21/2012  . Closed right ankle fracture 04/19/2012  . Chronic back pain    Social History   Tobacco Use  . Smoking status: Former Smoker    Years: 1.00    Types: Cigarettes    Quit date: 04/01/1983    Years since quitting: 35.5  . Smokeless tobacco: Never Used  . Tobacco comment: only snmoke 2- cigsper day when he smoked  Substance Use Topics  . Alcohol use: No    Alcohol/week: 0.0 standard drinks   Review of Systems  Constitutional: Negative for chills, fatigue and fever.  HENT: Negative for congestion, ear pain, sinus pain and sore throat.   Eyes: Negative.   Respiratory: Negative for cough, shortness of breath and wheezing.   Cardiovascular: Negative for chest pain, palpitations and leg swelling.  Gastrointestinal: Negative for abdominal  pain, diarrhea, nausea and vomiting.  Genitourinary: Negative for dysuria, frequency and urgency.  Musculoskeletal: Negative for arthralgias and myalgias.  Skin: Negative for color change, pallor and rash.  Neurological: Negative for syncope, light-headedness and headaches.  Psychiatric/Behavioral: The patient is not nervous/anxious.       Objective:   Physical Exam Vitals signs and nursing note reviewed.  Constitutional:      General: He is not in acute distress.    Appearance: He is not ill-appearing or toxic-appearing.  HENT:     Head: Normocephalic and atraumatic.  Cardiovascular:     Rate and Rhythm: Normal rate and regular rhythm.  Pulmonary:     Effort: Pulmonary effort is normal.     Breath sounds: Normal breath sounds.  Musculoskeletal:     Comments: Range of motion of bilateral upper extremities intact.  No pain with palpation on the left elbow.  Patient does have swollen olecranon bursa of left elbow.  Swelling is not red or hot to touch.  Swelling is approximately the size of a golf ball.  Skin:    General: Skin is warm and dry.  Neurological:     Mental Status: He is alert and oriented to person, place, and time. Mental status is at baseline.  Psychiatric:        Mood and Affect: Mood normal.        Behavior: Behavior normal.    Vitals:  10/05/18 0827  BP: 140/82  Pulse: 67  Resp: 18  Temp: 98.3 F (36.8 C)  SpO2: 94%      Assessment & Plan:    Left olecranon bursitis-Ace wrap placed on the left elbow in clinic to help compress the area in hopes of reducing swelling bursa.  Patient advised to elevate elbow whenever able and also to do range of motion exercises of upper extremities throughout the day to help improve the swelling. Also advised to wear ACE wrap for compression and/or elbow pad to avoid bumping area.  Also advised that he can take Tylenol as needed if area becomes painful.  Advised to let us know if he notices any redness or increased heat of  the area, at that point he may require antibiotic however right now I do not feel the area is infected.  Advised if over the next couple of weeks with range of motion exercises, compression and use of elbow pads to be do not see area improve we will plan to refer to orthopedics for possible drainage of area.  Patient will keep regularly scheduled follow-up as planned.  He will let us know if area is not improving so we can get orthopedic referral coordinated.

## 2018-10-11 DIAGNOSIS — H2513 Age-related nuclear cataract, bilateral: Secondary | ICD-10-CM | POA: Diagnosis not present

## 2018-10-11 DIAGNOSIS — H35362 Drusen (degenerative) of macula, left eye: Secondary | ICD-10-CM | POA: Diagnosis not present

## 2018-10-11 DIAGNOSIS — H25013 Cortical age-related cataract, bilateral: Secondary | ICD-10-CM | POA: Diagnosis not present

## 2018-10-11 DIAGNOSIS — H35033 Hypertensive retinopathy, bilateral: Secondary | ICD-10-CM | POA: Diagnosis not present

## 2018-10-22 ENCOUNTER — Encounter: Payer: Self-pay | Admitting: Podiatry

## 2018-10-22 ENCOUNTER — Ambulatory Visit: Payer: BC Managed Care – PPO

## 2018-10-22 ENCOUNTER — Other Ambulatory Visit: Payer: Self-pay

## 2018-10-22 ENCOUNTER — Ambulatory Visit (INDEPENDENT_AMBULATORY_CARE_PROVIDER_SITE_OTHER): Payer: BC Managed Care – PPO | Admitting: Podiatry

## 2018-10-22 VITALS — Temp 98.4°F

## 2018-10-22 DIAGNOSIS — B351 Tinea unguium: Secondary | ICD-10-CM | POA: Diagnosis not present

## 2018-10-22 DIAGNOSIS — M79671 Pain in right foot: Secondary | ICD-10-CM

## 2018-10-22 DIAGNOSIS — L309 Dermatitis, unspecified: Secondary | ICD-10-CM | POA: Diagnosis not present

## 2018-10-22 MED ORDER — TERBINAFINE HCL 250 MG PO TABS: 250.0000 mg | ORAL_TABLET | Freq: Every day | ORAL | 0 refills | Status: DC

## 2018-10-28 NOTE — Progress Notes (Signed)
Subjective:   Patient ID: Val Riles, male   DOB: 58 y.o.   MRN: [id number]   HPI Patient presents stating the irritation between my digits does seem to have gotten worse again and I am not been seen for a number of years.  Patient states he is tried foot sprays and is been going on for around 6 months and is irritative.  Patient does not smoke and would like to be more active   Review of Systems  All other systems reviewed and are negative.       Objective:  Physical Exam Vitals signs and nursing note reviewed.  Constitutional:      Appearance: He is well-developed.  Pulmonary:     Effort: Pulmonary effort is normal.  Musculoskeletal: Normal range of motion.  Skin:    General: Skin is warm.  Neurological:     Mental Status: He is alert.     Neurovascular status intact muscle strength found to be adequate range of motion within normal limits.  Patient is found to have discoloration and irritation between the lesser digits of both feet that is localized in nature with no proximal edema erythema or no drainage or odor associated with this.  Patient has good digital perfusion well oriented x3 and is recently had blood work done with normal function liver     Assessment:  Fungal infection more in skin versus nail bilateral with previous history     Plan:  H&P educated him on condition and discussed continued Nizoral topical.  I then wrote prescription for Lamisil 200 mg x 60 days 1/day given instructions on usage and what to watch out for and if any issues were to occur to let us know immediately.  X-rays dated today due to the pain in his foot did not indicate any bony pathology or indications of lytic changes

## 2018-10-29 ENCOUNTER — Ambulatory Visit (INDEPENDENT_AMBULATORY_CARE_PROVIDER_SITE_OTHER): Payer: Medicare Other | Admitting: Family Medicine

## 2018-10-29 ENCOUNTER — Ambulatory Visit: Payer: Medicare Other | Admitting: Family Medicine

## 2018-10-29 ENCOUNTER — Telehealth: Payer: Self-pay | Admitting: Family Medicine

## 2018-10-29 ENCOUNTER — Other Ambulatory Visit: Payer: Self-pay

## 2018-10-29 ENCOUNTER — Encounter: Payer: Self-pay | Admitting: Family Medicine

## 2018-10-29 DIAGNOSIS — K760 Fatty (change of) liver, not elsewhere classified: Secondary | ICD-10-CM | POA: Diagnosis not present

## 2018-10-29 DIAGNOSIS — M7022 Olecranon bursitis, left elbow: Secondary | ICD-10-CM | POA: Insufficient documentation

## 2018-10-29 NOTE — Telephone Encounter (Signed)
Please contact the patient and get him scheduled for labs sometime in the next week or 2.  He also needs a nurse visit for hepatitis A vaccine prior to August 12.  He can have the labs and nurse visit on the same day.

## 2018-10-29 NOTE — Progress Notes (Signed)
Virtual Visit via telephone Note  This visit type was conducted due to national recommendations for restrictions regarding the COVID-19 pandemic (e.g. social distancing).  This format is felt to be most appropriate for this patient at this time.  All issues noted in this document were discussed and addressed.  No physical exam was performed (except for noted visual exam findings with Video Visits).   I connected with Matthew Juarez today at  1:15 PM EDT by telephone and verified that I am speaking with the correct person using two identifiers. Location patient: in the car Location provider: work Persons participating in the virtual visit: patient, provider, sister-in-law  I discussed the limitations, risks, security and privacy concerns of performing an evaluation and management service by telephone and the availability of in person appointments. I also discussed with the patient that there may be a patient responsible charge related to this service. The patient expressed understanding and agreed to proceed.  Interactive audio and video telecommunications were attempted between this provider and patient, however failed, due to patient having technical difficulties OR patient did not have access to video capability.  We continued and completed visit with audio only.   Reason for visit: follow-up  HPI: Olecranon bursitis: This is in his left elbow.  He saw FNP previously and was advised on ACE wrap.  This has been improving progressively.  Has not resolved all the way.  No pain.  No redness.  No warmth or irritation.  Fatty liver: Patient has been monitoring his diet though does occasionally eat fried foods.  Gets plenty of vegetables.  He does wheelchair exercises.  No abdominal pain.  He is due for his second hepatitis A vaccine.   ROS: See pertinent positives and negatives per HPI.  Past Medical History:  Diagnosis Date  . Arthritis   . Cardiac arrest ([place]) 06/23/2012  . Chronic back  pain   . Gout   . Hammer toe   . Headache(784.0)   . History of blood clots   . Hypertension   . Pulmonary embolism ([place])   . Pulmonary infarction ([place]) 07/23/2012   Overview:  Last Assessment & Plan:  With associated R heart failure and cardiac arrest.  - you could make an argument to treat him with lifelong coumadin given the severity of his PE, although the literature supports 12 months.  - he will be treated (at least) 12 months, to be followed by Dr Karlton Lemon.  - will need hypercoag w/u a month after the coumadin is stopped - restart coumadin if at any incr  . Transverse myelitis [facility])     Past Surgical History:  Procedure Laterality Date  . HAMMER TOE SURGERY     2016    Family History  Problem Relation Age of Onset  . Dementia Mother   . Lung cancer Maternal Uncle   . Colon cancer Maternal Aunt   . Lung cancer Maternal Aunt   . Heart disease Neg Hx     SOCIAL HX: Former smoker.   Current Outpatient Medications:  .  acetaminophen (TYLENOL) 500 MG tablet, Take 500 mg by mouth every 6 (six) hours as needed for mild pain., Disp: , Rfl:  .  allopurinol (ZYLOPRIM) 300 MG tablet, TAKE 1 TABLET BY MOUTH EVERY DAY, Disp: 90 tablet, Rfl: 3 .  atorvastatin (LIPITOR) 40 MG tablet, TAKE 1 TABLET BY MOUTH EVERY DAY, Disp: 90 tablet, Rfl: 3 .  Cholecalciferol (VITAMIN D) 2000 UNITS CAPS, Take 2,000 Units by mouth daily.,  Disp: , Rfl:  .  Clobetasol Propionate 0.05 % lotion, APPLY TO AFFECTED AREA TWICE A DAY AS NEEDED, Disp: , Rfl:  .  Coenzyme Q10 (CO Q-10) 200 MG CAPS, Take 1 capsule by mouth daily., Disp: 30 each, Rfl: 1 .  desonide (DESOWEN) 0.05 % cream, APPLY TO AFECTED AREA TWICE A DAY AS NEEDED, Disp: , Rfl: 3 .  esomeprazole (NEXIUM) 20 MG capsule, Take 1 capsule (20 mg total) by mouth daily before breakfast., Disp: 90 capsule, Rfl: 3 .  Fluocinolone Acetonide Scalp 0.01 % OIL, APPLY TO SCALP AT BEDTIME, WASH OUT WHEN WAKE UP, Disp: , Rfl: 3 .  gabapentin (NEURONTIN) 600 MG  tablet, Take 600 mg by mouth 2 (two) times daily., Disp: , Rfl:  .  ketoconazole (NIZORAL) 2 % cream, Apply 1 application topically daily., Disp: 15 g, Rfl: 0 .  Oxycodone HCl 10 MG TABS, Take by mouth., Disp: , Rfl:  .  terbinafine (LAMISIL) 250 MG tablet, Take 1 tablet (250 mg total) by mouth daily., Disp: 60 tablet, Rfl: 0 .  XARELTO 20 MG TABS tablet, TAKE 1 TABLET (20 MG TOTAL) BY MOUTH DAILY WITH SUPPER., Disp: 90 tablet, Rfl: 3  EXAM: No physical exam was completed as this was a telehealth telephone visit.  ASSESSMENT AND PLAN:  Discussed the following assessment and plan:  Olecranon bursitis of left elbow Progressively improving.  We will continue the ACE wrap.  If it worsens he will let us know.  If he develops signs of infection he will contact us immediately.  Fatty liver Continue diet and exercise.  We will get him scheduled for his second hepatitis A vaccine.    I discussed the assessment and treatment plan with the patient. The patient was provided an opportunity to ask questions and all were answered. The patient agreed with the plan and demonstrated an understanding of the instructions.   The patient was advised to call back or seek an in-person evaluation if the symptoms worsen or if the condition fails to improve as anticipated.  I provided 8 minutes of non-face-to-face time during this encounter.   Marikay AlarEric Channel Papandrea, MD

## 2018-10-29 NOTE — Assessment & Plan Note (Signed)
Progressively improving.  We will continue the ACE wrap.  If it worsens he will let us know.  If he develops signs of infection he will contact us immediately.

## 2018-10-29 NOTE — Assessment & Plan Note (Signed)
Continue diet and exercise.  We will get him scheduled for his second hepatitis A vaccine.

## 2018-11-01 NOTE — Telephone Encounter (Signed)
Called and spoke with patient and scheduled nurse visit and lab for August 11, 202.  Nina,cma

## 2018-11-05 DIAGNOSIS — G360 Neuromyelitis optica [Devic]: Secondary | ICD-10-CM | POA: Diagnosis not present

## 2018-11-09 ENCOUNTER — Telehealth: Payer: Self-pay

## 2018-11-09 ENCOUNTER — Other Ambulatory Visit (INDEPENDENT_AMBULATORY_CARE_PROVIDER_SITE_OTHER): Payer: Medicare Other

## 2018-11-09 ENCOUNTER — Ambulatory Visit (INDEPENDENT_AMBULATORY_CARE_PROVIDER_SITE_OTHER): Payer: Medicare Other

## 2018-11-09 ENCOUNTER — Other Ambulatory Visit: Payer: Self-pay

## 2018-11-09 DIAGNOSIS — Z125 Encounter for screening for malignant neoplasm of prostate: Secondary | ICD-10-CM

## 2018-11-09 DIAGNOSIS — Z23 Encounter for immunization: Secondary | ICD-10-CM | POA: Diagnosis not present

## 2018-11-09 DIAGNOSIS — Z86711 Personal history of pulmonary embolism: Secondary | ICD-10-CM

## 2018-11-09 DIAGNOSIS — E785 Hyperlipidemia, unspecified: Secondary | ICD-10-CM | POA: Diagnosis not present

## 2018-11-09 DIAGNOSIS — R7303 Prediabetes: Secondary | ICD-10-CM

## 2018-11-09 DIAGNOSIS — M1A0710 Idiopathic chronic gout, right ankle and foot, without tophus (tophi): Secondary | ICD-10-CM

## 2018-11-09 DIAGNOSIS — Z1211 Encounter for screening for malignant neoplasm of colon: Secondary | ICD-10-CM

## 2018-11-09 LAB — COMPREHENSIVE METABOLIC PANEL
ALT: 21 U/L (ref 0–53)
AST: 19 U/L (ref 0–37)
Albumin: 4.6 g/dL (ref 3.5–5.2)
Alkaline Phosphatase: 107 U/L (ref 39–117)
BUN: 14 mg/dL (ref 6–23)
CO2: 29 mEq/L (ref 19–32)
Calcium: 9.6 mg/dL (ref 8.4–10.5)
Chloride: 103 mEq/L (ref 96–112)
Creatinine, Ser: 0.93 mg/dL (ref 0.40–1.50)
GFR: 100.28 mL/min (ref >60.00)
Glucose, Bld: 87 mg/dL (ref 70–99)
Potassium: 4.2 mEq/L (ref 3.5–5.1)
Sodium: 140 mEq/L (ref 135–145)
Total Bilirubin: 1.2 mg/dL (ref 0.2–1.2)
Total Protein: 6.4 g/dL (ref 6.0–8.3)

## 2018-11-09 LAB — CBC
HCT: 44.1 % (ref 39.0–52.0)
Hemoglobin: 14.5 g/dL (ref 13.0–17.0)
MCHC: 32.8 g/dL (ref 30.0–36.0)
MCV: 88.5 fl (ref 78.0–100.0)
Platelets: 185 10*3/uL (ref 150.0–400.0)
RBC: 4.99 Mil/uL (ref 4.22–5.81)
RDW: 13.5 % (ref 11.5–15.5)
WBC: 4.3 10*3/uL (ref 4.0–10.5)

## 2018-11-09 LAB — LIPID PANEL
Cholesterol: 131 mg/dL (ref 0–200)
HDL: 44.9 mg/dL (ref >39.00)
LDL Cholesterol: 70 mg/dL (ref 0–99)
NonHDL: 85.99
Total CHOL/HDL Ratio: 3
Triglycerides: 78 mg/dL (ref 0.0–149.0)
VLDL: 15.6 mg/dL (ref 0.0–40.0)

## 2018-11-09 LAB — HEMOGLOBIN A1C: Hgb A1c MFr Bld: 5.9 % (ref 4.6–6.5)

## 2018-11-09 LAB — PSA, MEDICARE: PSA: 0.45 ng/ml (ref 0.10–4.00)

## 2018-11-09 LAB — URIC ACID: Uric Acid, Serum: 4.5 mg/dL (ref 4.0–7.8)

## 2018-11-09 NOTE — Telephone Encounter (Signed)
Patient requesting a prescription for a new motorized wheelchair to be sent to [facility].  Patient also said that he is due for a colonoscopy.  Pt requesting a referral.

## 2018-11-09 NOTE — Telephone Encounter (Signed)
Patient called and requested a new RX for a motorized wheelchair to be sent to [facility]  and he would also like a referral for a colonoscopy.  Matthew Juarez,cma

## 2018-11-09 NOTE — Addendum Note (Signed)
Addended by: Caryl Bis Kinjal Neitzke G on: 11/09/2018 12:54 PM   Modules accepted: Orders

## 2018-11-09 NOTE — Telephone Encounter (Signed)
Referral for colonoscopy placed. I will forward to Juliann Pulse to see if she can create a DME order for a motorized wheel chair for me to sign.

## 2018-11-10 ENCOUNTER — Other Ambulatory Visit: Payer: Self-pay

## 2018-11-10 ENCOUNTER — Telehealth: Payer: Self-pay

## 2018-11-10 DIAGNOSIS — Z1211 Encounter for screening for malignant neoplasm of colon: Secondary | ICD-10-CM

## 2018-11-10 NOTE — Telephone Encounter (Signed)
Gastroenterology Pre-Procedure Review  Request Date: 11/22/18 Requesting Physician: Dr. Vicente Males  PATIENT REVIEW QUESTIONS: The patient responded to the following health history questions as indicated:    1. Are you having any GI issues? no 2. Do you have a personal history of Polyps? no 3. Do you have a family history of Colon Cancer or Polyps? no 4. Diabetes Mellitus? no 5. Joint replacements in the past 12 months?no 6. Major health problems in the past 3 months?no 7. Any artificial heart valves, MVP, or defibrillator?no    MEDICATIONS & ALLERGIES:    Patient reports the following regarding taking any anticoagulation/antiplatelet therapy:   Plavix, Coumadin, Eliquis, Xarelto, Lovenox, Pradaxa, Brilinta, or Effient? yes (xarelto blood thinner sent to dr. Caryl Bis) Aspirin? no  Patient confirms/reports the following medications:  Current Outpatient Medications  Medication Sig Dispense Refill  . acetaminophen (TYLENOL) 500 MG tablet Take 500 mg by mouth every 6 (six) hours as needed for mild pain.    Marland Kitchen allopurinol (ZYLOPRIM) 300 MG tablet TAKE 1 TABLET BY MOUTH EVERY DAY 90 tablet 3  . atorvastatin (LIPITOR) 40 MG tablet TAKE 1 TABLET BY MOUTH EVERY DAY 90 tablet 3  . Cholecalciferol (VITAMIN D) 2000 UNITS CAPS Take 2,000 Units by mouth daily.    . Clobetasol Propionate 0.05 % lotion APPLY TO AFFECTED AREA TWICE A DAY AS NEEDED    . Coenzyme Q10 (CO Q-10) 200 MG CAPS Take 1 capsule by mouth daily. 30 each 1  . desonide (DESOWEN) 0.05 % cream APPLY TO AFECTED AREA TWICE A DAY AS NEEDED  3  . esomeprazole (NEXIUM) 20 MG capsule Take 1 capsule (20 mg total) by mouth daily before breakfast. 90 capsule 3  . Fluocinolone Acetonide Scalp 0.01 % OIL APPLY TO SCALP AT BEDTIME, WASH OUT WHEN WAKE UP  3  . gabapentin (NEURONTIN) 600 MG tablet Take 600 mg by mouth 2 (two) times daily.    Marland Kitchen ketoconazole (NIZORAL) 2 % cream Apply 1 application topically daily. 15 g 0  . Oxycodone HCl 10 MG TABS Take  by mouth.    . terbinafine (LAMISIL) 250 MG tablet Take 1 tablet (250 mg total) by mouth daily. 60 tablet 0  . XARELTO 20 MG TABS tablet TAKE 1 TABLET (20 MG TOTAL) BY MOUTH DAILY WITH SUPPER. 90 tablet 3   No current facility-administered medications for this visit.     Patient confirms/reports the following allergies:  No Known Allergies  No orders of the defined types were placed in this encounter.   AUTHORIZATION INFORMATION Primary Insurance: 1D#: Group #:  Secondary Insurance: 1D#: Group #:  SCHEDULE INFORMATION: Date: 11/22/18 Time: Location:[facility]

## 2018-11-11 ENCOUNTER — Encounter: Payer: Self-pay | Admitting: Family Medicine

## 2018-11-18 ENCOUNTER — Other Ambulatory Visit: Admission: RE | Admit: 2018-11-18 | Payer: Medicare Other | Source: Ambulatory Visit

## 2018-11-18 ENCOUNTER — Telehealth: Payer: Self-pay | Admitting: Family Medicine

## 2018-11-18 NOTE — Telephone Encounter (Signed)
Left message for patient to return call to office. PEC nurse may advise. 

## 2018-11-18 NOTE — Telephone Encounter (Signed)
Noted  

## 2018-11-18 NOTE — Telephone Encounter (Signed)
Patient returned call- patient states he canceled the appointment because he has to go out of town. Advised patient to let PCP know when he reschedules so information can be provided to GI office regarding his anticoagulation management. He states he will.

## 2018-11-18 NOTE — Telephone Encounter (Signed)
I received a request from GI to give guidelines on holding the patients anticoagulation. Please contact the patient and see if they have cancelled his colonoscopy as it looks like it has been cancelled in epic. I can get the form completed and faxed tomorrow if they still need it. Thanks.

## 2018-11-22 ENCOUNTER — Encounter: Admission: RE | Payer: Self-pay | Source: Home / Self Care

## 2018-11-22 ENCOUNTER — Ambulatory Visit: Admission: RE | Admit: 2018-11-22 | Payer: Medicare Other | Source: Home / Self Care | Admitting: Gastroenterology

## 2018-11-22 SURGERY — COLONOSCOPY WITH PROPOFOL
Anesthesia: General

## 2018-11-23 DIAGNOSIS — Z1211 Encounter for screening for malignant neoplasm of colon: Secondary | ICD-10-CM | POA: Diagnosis not present

## 2018-11-29 ENCOUNTER — Telehealth: Payer: Self-pay | Admitting: Family Medicine

## 2018-11-29 DIAGNOSIS — G360 Neuromyelitis optica [Devic]: Secondary | ICD-10-CM

## 2018-11-29 NOTE — Telephone Encounter (Signed)
Patient called in stating [employer] had reached out to him saying it was time for him to have a new motorized wheelchair prescription be sent in. Fax number is [phone], attention Sharyn Lull. Please advise

## 2018-12-01 NOTE — Telephone Encounter (Signed)
Patient called in stating [facility] had reached out to him saying it was time for him to have a new motorized wheelchair prescription be sent in. Fax number is [phone], attention Sharyn Lull. Please advise  Nina,cma

## 2018-12-02 NOTE — Telephone Encounter (Signed)
I will forward to Juliann Pulse to see if she can print out a DME order for a motorized wheel chair. 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

## 2018-12-03 NOTE — Telephone Encounter (Signed)
Signed and placed in sign folder. Please fax.

## 2018-12-03 NOTE — Telephone Encounter (Signed)
DME printed

## 2018-12-06 DIAGNOSIS — Z1211 Encounter for screening for malignant neoplasm of colon: Secondary | ICD-10-CM | POA: Diagnosis not present

## 2018-12-06 DIAGNOSIS — Z1212 Encounter for screening for malignant neoplasm of rectum: Secondary | ICD-10-CM | POA: Diagnosis not present

## 2018-12-07 DIAGNOSIS — G360 Neuromyelitis optica [Devic]: Secondary | ICD-10-CM | POA: Diagnosis not present

## 2018-12-07 NOTE — Telephone Encounter (Signed)
Faxed signed motorized wheelchair order today.  Nina,cma

## 2018-12-13 DIAGNOSIS — M25512 Pain in left shoulder: Secondary | ICD-10-CM | POA: Diagnosis not present

## 2018-12-13 DIAGNOSIS — G8929 Other chronic pain: Secondary | ICD-10-CM | POA: Diagnosis not present

## 2018-12-13 DIAGNOSIS — G894 Chronic pain syndrome: Secondary | ICD-10-CM | POA: Diagnosis not present

## 2018-12-13 DIAGNOSIS — Z79891 Long term (current) use of opiate analgesic: Secondary | ICD-10-CM | POA: Diagnosis not present

## 2018-12-21 ENCOUNTER — Other Ambulatory Visit: Payer: Self-pay

## 2018-12-22 ENCOUNTER — Other Ambulatory Visit: Payer: Self-pay

## 2018-12-22 ENCOUNTER — Encounter: Payer: Self-pay | Admitting: Family Medicine

## 2018-12-22 ENCOUNTER — Ambulatory Visit (INDEPENDENT_AMBULATORY_CARE_PROVIDER_SITE_OTHER): Payer: Medicare Other | Admitting: Family Medicine

## 2018-12-22 DIAGNOSIS — T148XXA Other injury of unspecified body region, initial encounter: Secondary | ICD-10-CM | POA: Diagnosis not present

## 2018-12-22 DIAGNOSIS — Z23 Encounter for immunization: Secondary | ICD-10-CM

## 2018-12-22 NOTE — Progress Notes (Signed)
  [name], MD Phone: [phone]  Matthew Juarez is a 58 y.o. male who presents today for same-day visit.  Bruise: Patient notes he woke up on Saturday morning with a red area on his right lower inner leg above his ankle at his mid shin.  He thinks it might be a bruise that wonders if a bug bit him.  He notes no injury.  No pain.  No fevers.  He does not remember getting bitten.  It has been improving.  He notes no other similar lesions elsewhere.  He does take Xarelto.   Social History   Tobacco Use  Smoking Status Former Smoker  . Years: 1.00  . Types: Cigarettes  . Quit date: 04/01/1983  . Years since quitting: 35.7  Smokeless Tobacco Never Used  Tobacco Comment   only snmoke 2- cigsper day when he smoked     ROS see history of present illness  Objective  Physical Exam Vitals:   12/22/18 1102  BP: 130/80  Pulse: 64  Temp: (!) 97 F (36.1 C)  SpO2: 99%    BP Readings from Last 3 Encounters:  12/22/18 130/80  10/05/18 140/82  06/10/18 140/78   Wt Readings from Last 3 Encounters:  12/22/18 (!) 315 lb 12.8 oz (143.2 kg)  10/29/18 (!) 310 lb (140.6 kg)  10/05/18 (!) 310 lb 6.4 oz (140.8 kg)    Physical Exam Constitutional:      General: He is not in acute distress. Pulmonary:     Effort: Pulmonary effort is normal.  Neurological:     Mental Status: He is alert.    Right lower extremity with area of mild redness as above with no induration or warmth, there is minimal tenderness just to the left of the redness in the picture, no underlying palpable lesions   Assessment/Plan: Please see individual problem list.  Bruise I suspect this is a resolving bruise though could also represent a resolving bug bite.  This has been improving and he has no reported similar lesions elsewhere.  Given improvement we will have him continue to monitor.  If it does not resolve over the next week he will let us know.  If it worsens or recurs he will let us know.    Patient asked about his motorized wheelchair prescription.  I advised this was faxed earlier this month.  He will contact his home health company to check on this.  Orders Placed This Encounter  Procedures  . Flu Vaccine QUAD 36+ mos IM    No orders of the defined types were placed in this encounter.    [name], MD [place]

## 2018-12-22 NOTE — Patient Instructions (Signed)
Nice to see you. I suspect the area is either a resolving bruise or a resolving bug bite.  Please monitor the area and if it does not resolve completely over the next week please let us know.  If it returns or worsens please let us know right away.

## 2018-12-22 NOTE — Assessment & Plan Note (Signed)
I suspect this is a resolving bruise though could also represent a resolving bug bite.  This has been improving and he has no reported similar lesions elsewhere.  Given improvement we will have him continue to monitor.  If it does not resolve over the next week he will let us know.  If it worsens or recurs he will let us know.

## 2019-01-04 ENCOUNTER — Other Ambulatory Visit: Payer: Self-pay | Admitting: Family Medicine

## 2019-01-09 ENCOUNTER — Other Ambulatory Visit: Payer: Self-pay | Admitting: Family Medicine

## 2019-01-14 ENCOUNTER — Telehealth: Payer: Self-pay | Admitting: Family Medicine

## 2019-01-14 NOTE — Telephone Encounter (Signed)
Copied from [facility] [id number]. Topic: General - Other >> Jan 14, 2019 10:52 AM Keene Breath wrote: Reason for CRM: Calling to check the status of a referral for a power wheelchair.  She stated that they sent a fax two times with no response.  She will be sending a fax today as well.  CB# [phone]

## 2019-01-18 NOTE — Telephone Encounter (Signed)
Calling to check the status of a referral for a power wheelchair.  She stated that they sent a fax two times with no response.  She will be sending a fax today as well

## 2019-01-18 NOTE — Telephone Encounter (Signed)
Noted. Please look for this fax. I did not see it in my paperwork. Please see if it has already been completed and faxed as well as I believe I have already done this. Thanks.

## 2019-01-20 NOTE — Telephone Encounter (Signed)
I refaxed the wheelchair form that was originally faxed on 12/27/2018 to [facility] today. Confirmation printed. Nina,cma

## 2019-02-09 DIAGNOSIS — L218 Other seborrheic dermatitis: Secondary | ICD-10-CM | POA: Diagnosis not present

## 2019-02-12 ENCOUNTER — Other Ambulatory Visit: Payer: Self-pay | Admitting: Podiatry

## 2019-02-14 ENCOUNTER — Other Ambulatory Visit: Payer: Self-pay | Admitting: Family Medicine

## 2019-02-16 ENCOUNTER — Telehealth: Payer: Self-pay | Admitting: Family Medicine

## 2019-02-16 NOTE — Telephone Encounter (Signed)
Vanda with [facility] in [facility] called and would like to talk to referral coordinator about what diagnosis codes are for the order Dr. Caryl Bis send pt for. She can be reached at [phone].

## 2019-02-23 DIAGNOSIS — R2689 Other abnormalities of gait and mobility: Secondary | ICD-10-CM | POA: Diagnosis not present

## 2019-03-16 ENCOUNTER — Other Ambulatory Visit: Payer: Self-pay | Admitting: Podiatry

## 2019-03-16 NOTE — Telephone Encounter (Signed)
He can have 30 more

## 2019-03-17 NOTE — Telephone Encounter (Signed)
Patient will need to make an appointment once this medication runs out

## 2019-03-28 DIAGNOSIS — G373 Acute transverse myelitis in demyelinating disease of central nervous system: Secondary | ICD-10-CM | POA: Diagnosis not present

## 2019-03-28 DIAGNOSIS — M25512 Pain in left shoulder: Secondary | ICD-10-CM | POA: Diagnosis not present

## 2019-03-28 DIAGNOSIS — G8929 Other chronic pain: Secondary | ICD-10-CM | POA: Diagnosis not present

## 2019-04-01 ENCOUNTER — Other Ambulatory Visit: Payer: Self-pay | Admitting: Podiatry

## 2019-04-05 DIAGNOSIS — G360 Neuromyelitis optica [Devic]: Secondary | ICD-10-CM | POA: Diagnosis not present

## 2019-04-12 ENCOUNTER — Other Ambulatory Visit: Payer: Medicare Other

## 2019-04-14 ENCOUNTER — Telehealth: Payer: Self-pay | Admitting: Family Medicine

## 2019-04-14 DIAGNOSIS — U071 COVID-19: Secondary | ICD-10-CM

## 2019-04-14 NOTE — Telephone Encounter (Signed)
Pt is wanting something called in for a cough. He said that it has been going on for a month now

## 2019-04-14 NOTE — Telephone Encounter (Signed)
He needs to complete an evaluation for this. He could be scheduled for a virtual visit is there is availability.

## 2019-04-14 NOTE — Telephone Encounter (Signed)
Patient having a dry cough no fever, X 3 weeks, has not been exposed to COVID as to be aware."  Nothing but just an old dry cough." advised patient he would probably need  Evaluation ask be sent PCP .

## 2019-04-15 ENCOUNTER — Telehealth: Payer: Self-pay | Admitting: Nurse Practitioner

## 2019-04-15 NOTE — Telephone Encounter (Signed)
Called to Discuss with patient about Covid symptoms and the use of bamlanivimab, a monoclonal antibody infusion for those with mild to moderate Covid symptoms and at a high risk of hospitalization.     Patient states that cough started 3 weeks ago.

## 2019-04-15 NOTE — Telephone Encounter (Signed)
Patient tested Positive yesterday for COVID at work, he has been advised of quarantine, and home care, patient denies SOB, Chest pain, fatigue. Only patient reported symptoms is dry cough. Set up COVID companion my chart and scheduled telephone visit for next week to follow up. Advised if any new symptoms develop such as SOB ,Chest pain to be evaluated immediately. From looking at chart would be a candidate  possibly [facility]?

## 2019-04-15 NOTE — Telephone Encounter (Signed)
Notified patient of recommendation and called infusion center left patient information.

## 2019-04-15 NOTE — Telephone Encounter (Signed)
Noted. He would meet criteria as long as he is in the window for treatment. I think it would be good to refer him to the infusion team to figure out if he can have the antibody infusion. Thanks.

## 2019-04-15 NOTE — Telephone Encounter (Signed)
Called to Discuss with patient about Covid symptoms and the use of bamlanivimab, a monoclonal antibody infusion for those with mild to moderate Covid symptoms and at a high risk of hospitalization.     Pt is qualified for this infusion at the [facility] center due to co-morbid conditions and/or a member of an at-risk group.     Unable to reach pt  

## 2019-04-19 ENCOUNTER — Other Ambulatory Visit: Payer: Self-pay

## 2019-04-19 ENCOUNTER — Ambulatory Visit (INDEPENDENT_AMBULATORY_CARE_PROVIDER_SITE_OTHER): Payer: Medicare Other | Admitting: Family Medicine

## 2019-04-19 DIAGNOSIS — U071 COVID-19: Secondary | ICD-10-CM | POA: Diagnosis not present

## 2019-04-19 HISTORY — DX: COVID-19: U07.1

## 2019-04-19 MED ORDER — BENZONATATE 200 MG PO CAPS: 200.0000 mg | ORAL_CAPSULE | Freq: Two times a day (BID) | ORAL | 0 refills | Status: DC | PRN

## 2019-04-19 NOTE — Assessment & Plan Note (Signed)
Overall mild symptoms with cough and fever.  Cough continues.  He continues to have intermittent fevers.  We will trial Tessalon for his cough.  He will have somebody else pick this up and drop it at his door.  Discussed quarantine duration of 10 days with at least 3 days of improved symptoms and at least 3 days of no fever without the use of Tylenol.  He will additionally contact the health department again as he had a message from them yesterday.  I encouraged him to let them know that he is continuing to have fevers as that will lengthen his quarantine.  Discussed if his fever is not resolving over the next several days he should let us know and we would need to consider getting a chest x-ray given duration of symptoms.  Given reasons to seek medical attention in the emergency room.

## 2019-04-19 NOTE — Progress Notes (Signed)
Virtual Visit via telephone Note  This visit type was conducted due to national recommendations for restrictions regarding the COVID-19 pandemic (e.g. social distancing).  This format is felt to be most appropriate for this patient at this time.  All issues noted in this document were discussed and addressed.  No physical exam was performed (except for noted visual exam findings with Video Visits).   I connected with Matthew Juarez today at 12:30 PM EST by a video enabled telemedicine application or telephone and verified that I am speaking with the correct person using two identifiers. Location patient: home Location provider: work  Persons participating in the virtual visit: patient, provider  I discussed the limitations, risks, security and privacy concerns of performing an evaluation and management service by telephone and the availability of in person appointments. I also discussed with the patient that there may be a patient responsible charge related to this service. The patient expressed understanding and agreed to proceed.  Interactive audio and video telecommunications were attempted between this provider and patient, however failed, due to patient having technical difficulties OR patient did not have access to video capability.  We continued and completed visit with audio only.   Reason for visit: Same day visit.  HPI: COVID-19: Patient notes cough onset about 4 weeks ago.  He notes temperature going up and down for the last 2 to 3 weeks.  T-max 100.7 F yesterday.  Improved to 99.1 F with Tylenol.  Patient was tested on 1/12 and received his result on 1/14.  He notes no shortness of breath.  No rash.  No loss of smell or taste.  No diarrhea.  No headache.  No sore throat.  Cough is nonproductive.  He tried Delsym, Robitussin, and Mucinex.  He only takes his oxycodone as needed for shoulder pain.  He notes his wife tested positive as well.  He has been in touch with the health  department.   ROS: See pertinent positives and negatives per HPI.  Past Medical History:  Diagnosis Date  . Arthritis   . Cardiac arrest ([place]) 06/23/2012  . Chronic back pain   . Gout   . Hammer toe   . Headache(784.0)   . History of blood clots   . Hypertension   . Pulmonary embolism ([place])   . Pulmonary infarction ([place]) 07/23/2012   Overview:  Last Assessment & Plan:  With associated R heart failure and cardiac arrest.  - you could make an argument to treat him with lifelong coumadin given the severity of his PE, although the literature supports 12 months.  - he will be treated (at least) 12 months, to be followed by Dr Karlton Lemon.  - will need hypercoag w/u a month after the coumadin is stopped - restart coumadin if at any incr  . Transverse myelitis [facility])     Past Surgical History:  Procedure Laterality Date  . HAMMER TOE SURGERY     2016    Family History  Problem Relation Age of Onset  . Dementia Mother   . Lung cancer Maternal Uncle   . Colon cancer Maternal Aunt   . Lung cancer Maternal Aunt   . Heart disease Neg Hx     SOCIAL HX: Former smoker   Current Outpatient Medications:  .  acetaminophen (TYLENOL) 500 MG tablet, Take 500 mg by mouth every 6 (six) hours as needed for mild pain., Disp: , Rfl:  .  allopurinol (ZYLOPRIM) 300 MG tablet, TAKE 1 TABLET BY MOUTH EVERY  DAY, Disp: 90 tablet, Rfl: 3 .  atorvastatin (LIPITOR) 40 MG tablet, TAKE 1 TABLET BY MOUTH EVERY DAY, Disp: 90 tablet, Rfl: 3 .  Cholecalciferol (VITAMIN D) 2000 UNITS CAPS, Take 2,000 Units by mouth daily., Disp: , Rfl:  .  Clobetasol Propionate 0.05 % lotion, APPLY TO AFFECTED AREA TWICE A DAY AS NEEDED, Disp: , Rfl:  .  Coenzyme Q10 (CO Q-10) 200 MG CAPS, Take 1 capsule by mouth daily., Disp: 30 each, Rfl: 1 .  desonide (DESOWEN) 0.05 % cream, APPLY TO AFECTED AREA TWICE A DAY AS NEEDED, Disp: , Rfl: 3 .  esomeprazole (NEXIUM) 20 MG capsule, Take 1 capsule (20 mg total) by mouth daily before  breakfast., Disp: 90 capsule, Rfl: 3 .  Fluocinolone Acetonide Scalp 0.01 % OIL, APPLY TO SCALP AT BEDTIME, WASH OUT WHEN WAKE UP, Disp: , Rfl: 3 .  gabapentin (NEURONTIN) 600 MG tablet, Take 600 mg by mouth 2 (two) times daily., Disp: , Rfl:  .  ketoconazole (NIZORAL) 2 % cream, Apply 1 application topically daily., Disp: 15 g, Rfl: 0 .  Oxycodone HCl 10 MG TABS, Take by mouth., Disp: , Rfl:  .  terbinafine (LAMISIL) 250 MG tablet, TAKE 1 TABLET BY MOUTH EVERY DAY, Disp: 30 tablet, Rfl: 0 .  XARELTO 20 MG TABS tablet, TAKE 1 TABLET (20 MG TOTAL) BY MOUTH DAILY WITH SUPPER., Disp: 90 tablet, Rfl: 3 .  benzonatate (TESSALON) 200 MG capsule, Take 1 capsule (200 mg total) by mouth 2 (two) times daily as needed for cough., Disp: 20 capsule, Rfl: 0  EXAM: This was a telehealth telephone visit and thus no physical exam was completed.  ASSESSMENT AND PLAN:  Discussed the following assessment and plan:  COVID-19 Overall mild symptoms with cough and fever.  Cough continues.  He continues to have intermittent fevers.  We will trial Tessalon for his cough.  He will have somebody else pick this up and drop it at his door.  Discussed quarantine duration of 10 days with at least 3 days of improved symptoms and at least 3 days of no fever without the use of Tylenol.  He will additionally contact the health department again as he had a message from them yesterday.  I encouraged him to let them know that he is continuing to have fevers as that will lengthen his quarantine.  Discussed if his fever is not resolving over the next several days he should let us know and we would need to consider getting a chest x-ray given duration of symptoms.  Given reasons to seek medical attention in the emergency room.   No orders of the defined types were placed in this encounter.   Meds ordered this encounter  Medications  . benzonatate (TESSALON) 200 MG capsule    Sig: Take 1 capsule (200 mg total) by mouth 2 (two) times  daily as needed for cough.    Dispense:  20 capsule    Refill:  0     I discussed the assessment and treatment plan with the patient. The patient was provided an opportunity to ask questions and all were answered. The patient agreed with the plan and demonstrated an understanding of the instructions.   The patient was advised to call back or seek an in-person evaluation if the symptoms worsen or if the condition fails to improve as anticipated.  I provided 9 minutes of non-face-to-face time during this encounter.   [name], MD

## 2019-04-21 ENCOUNTER — Telehealth: Payer: Self-pay | Admitting: Family Medicine

## 2019-04-21 NOTE — Telephone Encounter (Signed)
Pt states that the benzonatate (TESSALON) 200 MG capsule is not doing anything for his cough and would like something else called in. Please advise.

## 2019-04-22 MED ORDER — HYDROCODONE-HOMATROPINE 5-1.5 MG/5ML PO SYRP: 5.0000 mL | ORAL_SOLUTION | Freq: Three times a day (TID) | ORAL | 0 refills | Status: DC | PRN

## 2019-04-22 NOTE — Telephone Encounter (Signed)
I called and spoke with the patient and informed him that the provider sent over [facility] for his cough and I informed him that it would make him drowsy and he is to not drive while taking it and he does not need to take it with oxycodone.  Patient understood.  Rori Goar,cma

## 2019-04-22 NOTE — Telephone Encounter (Signed)
I have sent Hycodan in for the patient.  The patient needs to be careful as this could make him drowsy.  If he is drowsy he should not drive.  He should not take his chronic oxycodone when he is taking the Hycodan.

## 2019-04-28 ENCOUNTER — Ambulatory Visit
Admission: RE | Admit: 2019-04-28 | Discharge: 2019-04-28 | Disposition: A | Discharge status: Home / Self Care | Payer: Medicare Other | Source: Home / Self Care | Attending: Internal Medicine | Admitting: Internal Medicine

## 2019-04-28 ENCOUNTER — Telehealth: Payer: Self-pay

## 2019-04-28 ENCOUNTER — Other Ambulatory Visit: Payer: Self-pay

## 2019-04-28 ENCOUNTER — Emergency Department
Admission: EM | Admit: 2019-04-28 | Discharge: 2019-04-29 | Disposition: A | Discharge status: Home / Self Care | Payer: Medicare Other | Attending: Emergency Medicine | Admitting: Emergency Medicine

## 2019-04-28 ENCOUNTER — Telehealth: Payer: Self-pay | Admitting: Family Medicine

## 2019-04-28 ENCOUNTER — Ambulatory Visit
Admission: RE | Admit: 2019-04-28 | Discharge: 2019-04-28 | Disposition: A | Discharge status: Home / Self Care | Payer: Medicare Other | Source: Ambulatory Visit | Attending: Internal Medicine | Admitting: Internal Medicine

## 2019-04-28 ENCOUNTER — Encounter: Payer: Self-pay | Admitting: Emergency Medicine

## 2019-04-28 ENCOUNTER — Other Ambulatory Visit: Payer: Self-pay | Admitting: Internal Medicine

## 2019-04-28 DIAGNOSIS — U071 COVID-19: Secondary | ICD-10-CM | POA: Insufficient documentation

## 2019-04-28 DIAGNOSIS — I10 Essential (primary) hypertension: Secondary | ICD-10-CM | POA: Diagnosis not present

## 2019-04-28 DIAGNOSIS — Z86711 Personal history of pulmonary embolism: Secondary | ICD-10-CM | POA: Diagnosis not present

## 2019-04-28 DIAGNOSIS — R059 Cough, unspecified: Secondary | ICD-10-CM

## 2019-04-28 DIAGNOSIS — R05 Cough: Secondary | ICD-10-CM

## 2019-04-28 DIAGNOSIS — R0602 Shortness of breath: Secondary | ICD-10-CM | POA: Diagnosis present

## 2019-04-28 DIAGNOSIS — Z79899 Other long term (current) drug therapy: Secondary | ICD-10-CM | POA: Insufficient documentation

## 2019-04-28 DIAGNOSIS — J1282 Pneumonia due to coronavirus disease 2019: Secondary | ICD-10-CM

## 2019-04-28 DIAGNOSIS — R509 Fever, unspecified: Secondary | ICD-10-CM | POA: Diagnosis not present

## 2019-04-28 DIAGNOSIS — Z7901 Long term (current) use of anticoagulants: Secondary | ICD-10-CM | POA: Insufficient documentation

## 2019-04-28 DIAGNOSIS — J22 Unspecified acute lower respiratory infection: Secondary | ICD-10-CM | POA: Diagnosis not present

## 2019-04-28 LAB — TROPONIN I (HIGH SENSITIVITY): Troponin I (High Sensitivity): 6 ng/L (ref <18)

## 2019-04-28 LAB — COMPREHENSIVE METABOLIC PANEL
ALT: 33 U/L (ref 0–44)
AST: 38 U/L (ref 15–41)
Albumin: 3.7 g/dL (ref 3.5–5.0)
Alkaline Phosphatase: 76 U/L (ref 38–126)
Anion gap: 6 (ref 5–15)
BUN: 10 mg/dL (ref 6–20)
CO2: 30 mmol/L (ref 22–32)
Calcium: 8.8 mg/dL — ABNORMAL LOW (ref 8.9–10.3)
Chloride: 100 mmol/L (ref 98–111)
Creatinine, Ser: 0.8 mg/dL (ref 0.61–1.24)
GFR calc Af Amer: >60 mL/min (ref >60)
GFR calc non Af Amer: >60 mL/min (ref >60)
Glucose, Bld: 94 mg/dL (ref 70–99)
Potassium: 3.6 mmol/L (ref 3.5–5.1)
Sodium: 136 mmol/L (ref 135–145)
Total Bilirubin: 1.5 mg/dL — ABNORMAL HIGH (ref 0.3–1.2)
Total Protein: 6.5 g/dL (ref 6.5–8.1)

## 2019-04-28 LAB — CBC WITH DIFFERENTIAL/PLATELET
Abs Immature Granulocytes: 0.01 10*3/uL (ref 0.00–0.07)
Basophils Absolute: 0 10*3/uL (ref 0.0–0.1)
Basophils Relative: 0 %
Eosinophils Absolute: 0 10*3/uL (ref 0.0–0.5)
Eosinophils Relative: 0 %
HCT: 42.5 % (ref 39.0–52.0)
Hemoglobin: 13.8 g/dL (ref 13.0–17.0)
Immature Granulocytes: 0 %
Lymphocytes Relative: 14 %
Lymphs Abs: 0.4 10*3/uL — ABNORMAL LOW (ref 0.7–4.0)
MCH: 28.2 pg (ref 26.0–34.0)
MCHC: 32.5 g/dL (ref 30.0–36.0)
MCV: 86.9 fL (ref 80.0–100.0)
Monocytes Absolute: 0.3 10*3/uL (ref 0.1–1.0)
Monocytes Relative: 12 %
Neutro Abs: 2 10*3/uL (ref 1.7–7.7)
Neutrophils Relative %: 74 %
Platelets: 204 10*3/uL (ref 150–400)
RBC: 4.89 MIL/uL (ref 4.22–5.81)
RDW: 12.1 % (ref 11.5–15.5)
WBC: 2.7 10*3/uL — ABNORMAL LOW (ref 4.0–10.5)
nRBC: 0 % (ref 0.0–0.2)

## 2019-04-28 LAB — LACTIC ACID, PLASMA: Lactic Acid, Venous: 1 mmol/L (ref 0.5–1.9)

## 2019-04-28 LAB — FIBRIN DERIVATIVES D-DIMER (ARMC ONLY): Fibrin derivatives D-dimer (ARMC): 503.14 ng/mL (FEU) — ABNORMAL HIGH (ref 0.00–499.00)

## 2019-04-28 MED ORDER — ALBUTEROL SULFATE HFA 108 (90 BASE) MCG/ACT IN AERS
2.0000 | INHALATION_SPRAY | Freq: Once | RESPIRATORY_TRACT | Status: AC
  Administered 2019-04-28: 2 via RESPIRATORY_TRACT
  Filled 2019-04-28: qty 6.7

## 2019-04-28 MED ORDER — AZITHROMYCIN 500 MG PO TABS
500.0000 mg | ORAL_TABLET | Freq: Once | ORAL | Status: AC
  Administered 2019-04-28: 500 mg via ORAL
  Filled 2019-04-28: qty 1

## 2019-04-28 MED ORDER — DEXAMETHASONE SODIUM PHOSPHATE 10 MG/ML IJ SOLN
8.0000 mg | Freq: Once | INTRAMUSCULAR | Status: AC
  Administered 2019-04-28: 8 mg via INTRAVENOUS
  Filled 2019-04-28: qty 1

## 2019-04-28 MED ORDER — SODIUM CHLORIDE 0.9 % IV SOLN
2.0000 g | Freq: Once | INTRAVENOUS | Status: AC
  Administered 2019-04-28: 2 g via INTRAVENOUS
  Filled 2019-04-28: qty 20

## 2019-04-28 MED ORDER — ALBUTEROL SULFATE HFA 108 (90 BASE) MCG/ACT IN AERS: 1.0000 | INHALATION_SPRAY | RESPIRATORY_TRACT | 0 refills | Status: DC | PRN

## 2019-04-28 NOTE — Telephone Encounter (Signed)
2nd attempt to reach patient. Left message on vm.  Matthew Juarez,cma

## 2019-04-28 NOTE — Telephone Encounter (Signed)
Patient returned my call and I informed him that his chest xray came back and he has Pneumonia and he needed to go to the emergency room, he stated he was going to [facility].  Winna Golla,cma

## 2019-04-28 NOTE — ED Provider Notes (Signed)
[facility] Emergency Department Provider Note ____________________________________________   First MD Initiated Contact with Patient 04/28/19 2124     (approximate)  I have reviewed the triage vital signs and the nursing notes.   HISTORY  Chief Complaint Shortness of Breath and Cough  HPI Matthew Juarez is a 59 y.o. male with significant past medical history of PE and CAD on Xarelto presenting to the emergency department for treatment and evaluation of pneumonia after diagnosis of COVID-19.  He was evaluated by his primary care provider today and a chest x-ray was ordered.  He was contacted this evening to come to the emergency department for treatment and further evaluation. He denies shortness of breath, but states that the cough has just continued to worsen since his initial diagnosis on 04/19/19. No fever over the past 24 hours that he has noticed.      Past Medical History:  Diagnosis Date  . Arthritis   . Cardiac arrest ([facility]) 06/23/2012  . Chronic back pain   . Gout   . Hammer toe   . Headache(784.0)   . History of blood clots   . Hypertension   . Pulmonary embolism ([facility])   . Pulmonary infarction ([facility]) 07/23/2012   Overview:  Last Assessment & Plan:  With associated R heart failure and cardiac arrest.  - you could make an argument to treat him with lifelong coumadin given the severity of his PE, although the literature supports 12 months.  - he will be treated (at least) 12 months, to be followed by Dr Renae Gloss.  - will need hypercoag w/u a month after the coumadin is stopped - restart coumadin if at any incr  . Transverse myelitis [facility])     Patient Active Problem List   Diagnosis Date Noted  . COVID-19 04/19/2019  . Bruise 12/22/2018  . Olecranon bursitis of left elbow 10/29/2018  . Fatty liver 10/26/2017  . Chronic left shoulder pain 03/19/2017  . GERD (gastroesophageal reflux disease) 10/23/2016  . Obesity 06/30/2016  . Right heart failure  (HCC) 04/30/2016  . Coronary artery calcification 04/30/2016  . Palpitations 04/14/2016  . Hyperlipidemia 01/16/2016  . Prediabetes 09/13/2015  . Gout 08/10/2015  . History of hypertension 08/10/2015  . Chronic pain syndrome 07/11/2015  . Chronic, continuous use of opioids 05/05/2013  . History of pulmonary embolism 07/23/2012  . 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 Closed right ankle fracture 04/19/2012  . Chronic back pain     Past Surgical History:  Procedure Laterality Date  . HAMMER TOE SURGERY     2016    Prior to Admission medications   Medication Sig Start Date End Date Taking? Authorizing Provider  acetaminophen (TYLENOL) 500 MG tablet Take 500 mg by mouth every 6 (six) hours as needed for mild pain.    [provider]  albuterol (VENTOLIN HFA) 108 (90 Base) MCG/ACT inhaler Inhale 1-2 puffs into the lungs every 4 (four) hours as needed for wheezing or shortness of breath. 04/28/19   McLean-Scocuzza, Pasty Spillers, MD  allopurinol (ZYLOPRIM) 300 MG tablet TAKE 1 TABLET BY MOUTH EVERY DAY 01/06/19   Glori Luis, MD  atorvastatin (LIPITOR) 40 MG tablet TAKE 1 TABLET BY MOUTH EVERY DAY 01/10/19   Glori Luis, MD  azithromycin (ZITHROMAX) 250 MG tablet Take 2 tablets PO on day 1, then take 1 tablet PO daily for 4 more days 04/29/19   Loleta Rose, MD  benzonatate (TESSALON PERLES) 100 MG capsule Take 1 capsule (  100 mg total) by mouth 3 (three) times daily as needed for cough. 04/29/19   Hinda Kehr, MD  Cholecalciferol (VITAMIN D) 2000 UNITS CAPS Take 2,000 Units by mouth daily.    [provider]  Clobetasol Propionate 0.05 % lotion APPLY TO AFFECTED AREA TWICE A DAY AS NEEDED 10/20/16   [provider]  Coenzyme Q10 (CO Q-10) 200 MG CAPS Take 1 capsule by mouth daily. 05/11/12   Angiulli, Lavon Paganini, PA-C  desonide (DESOWEN) 0.05 % cream APPLY TO AFECTED AREA TWICE A DAY AS NEEDED 05/21/17   [provider]  esomeprazole (NEXIUM) 20 MG  capsule Take 1 capsule (20 mg total) by mouth daily before breakfast. 10/23/16   Leone Haven, MD  Fluocinolone Acetonide Scalp 0.01 % OIL APPLY TO SCALP AT BEDTIME, [place] OUT WHEN WAKE UP 05/21/17   [provider]  gabapentin (NEURONTIN) 600 MG tablet Take 600 mg by mouth 2 (two) times daily.    [provider]  HYDROcodone-Chlorpheniramine 5-4 MG/5ML SOLN Take 5 mLs by mouth every 6 (six) hours as needed. 04/29/19   Hinda Kehr, MD  HYDROcodone-homatropine [facility]) 5-1.5 MG/5ML syrup Take 5 mLs by mouth every 8 (eight) hours as needed for cough. 04/22/19   Leone Haven, MD  ketoconazole (NIZORAL) 2 % cream Apply 1 application topically daily. 06/30/16   Leone Haven, MD  Oxycodone HCl 10 MG TABS Take by mouth. 06/06/15   [provider]  predniSONE (DELTASONE) 10 MG tablet Take 6 tabs (60 mg) PO x 3 days, then take 4 tabs (40 mg) PO x 3 days, then take 2 tabs (20 mg) PO x 3 days, then take 1 tab (10 mg) PO x 3 days, then take 1/2 tab (5 mg) PO x 4 days. 04/29/19   Hinda Kehr, MD  terbinafine (LAMISIL) 250 MG tablet TAKE 1 TABLET BY MOUTH EVERY DAY 03/17/19   Regal, Tamala Fothergill, DPM  XARELTO 20 MG TABS tablet TAKE 1 TABLET (20 MG TOTAL) BY MOUTH DAILY WITH SUPPER. 02/14/19   Leone Haven, MD    Allergies Patient has no known allergies.  Family History  Problem Relation Age of Onset  . Dementia Mother   . Lung cancer Maternal Uncle   . Colon cancer Maternal Aunt   . Lung cancer Maternal Aunt   . Heart disease Neg Hx     Social History Social History   Tobacco Use  . Smoking status: Former Smoker    Years: 1.00    Types: Cigarettes    Quit date: 04/01/1983    Years since quitting: 36.1  . Smokeless tobacco: Never Used  . Tobacco comment: only snmoke 2- cigsper day when he smoked  Substance Use Topics  . Alcohol use: No    Alcohol/week: 0.0 standard drinks  . Drug use: No    Review of Systems  Constitutional: No fever/chills Eyes:  No visual changes. ENT: No sore throat. Cardiovascular: Denies chest pain. Respiratory: Denies shortness of breath. Positive for cough. Gastrointestinal: No abdominal pain.  No nausea, no vomiting.  No diarrhea.  No constipation. Genitourinary: Negative for dysuria. Musculoskeletal: Negative for back pain. Skin: Negative for rash. Neurological: Negative for headaches, focal weakness or numbness. ____________________________________________   PHYSICAL EXAM:  VITAL SIGNS: ED Triage Vitals  Enc Vitals Group     BP 04/28/19 1800 (!) 142/74     Pulse Rate 04/28/19 1800 74     Resp 04/28/19 1800 20     Temp 04/28/19  1800 99 F (37.2 C)     Temp Source 04/28/19 1800 Oral     SpO2 04/28/19 1800 94 %     Weight 04/28/19 1741 (!) 325 lb (147.4 kg)     Height 04/28/19 1741 6\' 5"  (1.956 m)     Head Circumference --      Peak Flow --      Pain Score 04/28/19 1741 0     Pain Loc --      Pain Edu? --      Excl. in GC? --     Constitutional: Alert and oriented. Well appearing and in no acute distress. Eyes: Conjunctivae are normal. PERRL. EOMI. Head: Atraumatic. Nose: No congestion/rhinnorhea. Mouth/Throat: Mucous membranes are moist.  Oropharynx non-erythematous. Neck: No stridor.   Hematological/Lymphatic/Immunilogical: No cervical lymphadenopathy. Cardiovascular: Normal rate, regular rhythm. Grossly normal heart sounds.  Good peripheral circulation. Respiratory: Normal respiratory effort.  No retractions. Lungs CTAB. Gastrointestinal: Soft and nontender. No distention. No abdominal bruits. No CVA tenderness. Genitourinary:  Musculoskeletal: No lower extremity tenderness nor edema.  No joint effusions. Neurologic:  Normal speech and language. No gross focal neurologic deficits are appreciated. No gait instability. Skin:  Skin is warm, dry and intact. No rash noted. Psychiatric: Mood and affect are normal. Speech and behavior are  normal.  ____________________________________________   LABS (all labs ordered are listed, but only abnormal results are displayed)  Labs Reviewed  COMPREHENSIVE METABOLIC PANEL - Abnormal; Notable for the following components:      Result Value   Calcium 8.8 (*)    Total Bilirubin 1.5 (*)    All other components within normal limits  CBC WITH DIFFERENTIAL/PLATELET - Abnormal; Notable for the following components:   WBC 2.7 (*)    Lymphs Abs 0.4 (*)    All other components within normal limits  FIBRIN DERIVATIVES D-DIMER ([facility] ONLY) - Abnormal; Notable for the following components:   Fibrin derivatives D-dimer ([facility]) 503.14 (*)    All other components within normal limits  LACTIC ACID, PLASMA  TROPONIN I (HIGH SENSITIVITY)   ____________________________________________  EKG  ED ECG REPORT I, Bently Morath, FNP-BC personally viewed and interpreted this ECG.   Date: 04/28/2019  EKG Time: 2014  Rate: 77  Rhythm: normal EKG, normal sinus rhythm  Axis: normal axis  Intervals:none  ST&T Change: Nonspecific T wave flattening in lateral leads  ____________________________________________  RADIOLOGY  ED MD interpretation:    Patchy opacities bilaterally  I, Rosaleen Mazer, personally viewed and evaluated these images (plain radiographs) as part of my medical decision making, as well as reviewing the written report by the radiologist.  Official radiology report(s): No results found.  ____________________________________________   PROCEDURES  Procedure(s) performed (including Critical Care):  Procedures  ____________________________________________   INITIAL IMPRESSION / ASSESSMENT AND PLAN     59 year old male presenting to the emergency department after being advised to do so by his primary care provider.  Bilateral pneumonia in the setting of COVID-19 noted on an outpatient chest x-ray today.  While awaiting ER room assignment, protocol labs are drawn.  2  serial troponins are negative, lactic acid is normal, CM P is unremarkable with the exception of an very mild elevation in bilirubin at 1.5, CBC does show a white blood cell count of 2.7 without any other remarkable findings.  Because of his history of PE and significant cardiac events, D-dimer added.  Patient appears comfortable on exam.  No respiratory distress.  He is denying shortness of breath or pleuritic  type chest pain.  His main concern is the cough.  Plan will be to await the results of the D-dimer and consider obtaining a CTA of the chest.  DIFFERENTIAL DIAGNOSIS  Pneumonia in the presence of COVID-19, pulmonary embolus  ED COURSE  2 g of Rocephin IV, azithromycin 500 p.o., and 8 mg of dexamethasone IV to be given as well as albuterol inhaler.   ----------------------------------------- 11:15 PM on 04/28/2019 -----------------------------------------  Pneumatic tube system error. Tube with d-dimer didn't make it to the lab. NT carrying it to lab now.   ----------------------------------------- 11:45 PM on 04/28/2019 -----------------------------------------  Patient care transitioned to Dr. York Cerise who will await result of d-dimer and make final disposition. Patient moved to room 5. ____________________________________________   FINAL CLINICAL IMPRESSION(S) / ED DIAGNOSES  Final diagnoses:  Lower respiratory tract infection due to 2019 novel coronavirus  Cough     ED Discharge Orders         Ordered    predniSONE (DELTASONE) 10 MG tablet     04/29/19 0046    azithromycin (ZITHROMAX) 250 MG tablet     04/29/19 0046    benzonatate (TESSALON PERLES) 100 MG capsule  3 times daily PRN     04/29/19 0046    HYDROcodone-Chlorpheniramine 5-4 MG/5ML SOLN  Every 6 hours PRN     04/29/19 0046           AVEL OGAWA was evaluated in Emergency Department on 04/29/2019 for the symptoms described in the history of present illness. He was evaluated in the context of the  global COVID-19 pandemic, which necessitated consideration that the patient might be at risk for infection with the SARS-CoV-2 virus that causes COVID-19. Institutional protocols and algorithms that pertain to the evaluation of patients at risk for COVID-19 are in a state of rapid change based on information released by regulatory bodies including the CDC and federal and state organizations. These policies and algorithms were followed during the patient's care in the ED.   Note:  This document was prepared using Dragon voice recognition software and may include unintentional dictation errors.   Chinita Pester, FNP 04/29/19 1746    Sharman Cheek, MD 05/05/19 [phone]

## 2019-04-28 NOTE — Telephone Encounter (Signed)
Sent inhaler albuterol but after review of CXR covid pneumonia rec go to ED at [facility]   TMS

## 2019-04-28 NOTE — Telephone Encounter (Signed)
Patient had a virtual with Dr. Birdie Sons last week. Patient was prescribed a cough medication. Pt is still coughing and thinks he needs a chest x-ray.  Patient is positive for covid-19 Please advise

## 2019-04-28 NOTE — ED Triage Notes (Signed)
Patient presents to the ED from urgent care.  Patient states he had xrays performed that showed pneumonia.  Patient states he has been short of breath with a cough x 1 month.  Patient is speaking in full sentences and is in no obvious distress at this time.  Patient denies chest pain. Patient is in a motorized wheelchair.

## 2019-04-28 NOTE — Telephone Encounter (Signed)
3rd attempt to reach patient. Sent a Clinical cytogeneticist message for the patient to call the office.  Matthew Juarez,cma

## 2019-04-28 NOTE — Telephone Encounter (Signed)
I called to give patient his chest x-ray results and I left a message for him to give me a call back.  Oluwaseyi Tull,cma

## 2019-04-28 NOTE — Telephone Encounter (Signed)
Patient had a virtual with Dr. Birdie Sons last week. Patient was prescribed a cough medication. Pt is still coughing and thinks he needs a chest x-ray. Please advise.

## 2019-04-28 NOTE — Telephone Encounter (Signed)
[facility] needs to go asap for CXR  Rec mucinex DM for cough  Does he have inhaler to use?  Rec D3 4000 IU daily  Vitamin C 1000 mg daily  Quercetin 250-500 mg 2x per day  Zinc 100 mg daily as well  As needed Tylenol for pain    If cough, sob, chest pain/tightness, blue lips or fingers, confusion or dizziness, persistent fever call 911 and go to hospital   TMS

## 2019-04-28 NOTE — ED Notes (Signed)
Pt was dx with covid Apr 11 2019.  Pt reports he had a cxr today and was told to come to the er for eval of pneumonia.  No chest pain.  No cough.  Pt alert  Speech clear.

## 2019-04-28 NOTE — Telephone Encounter (Signed)
Patient was informed the instructions given, he does not have an inhaler and would like one sent to his pharmacy. He is on his way to get the chest xray.  Aunisty Reali,cma

## 2019-04-29 MED ORDER — PREDNISONE 10 MG PO TABS: ORAL_TABLET | ORAL | 0 refills | Status: DC

## 2019-04-29 MED ORDER — AZITHROMYCIN 250 MG PO TABS: ORAL_TABLET | ORAL | 0 refills | Status: DC

## 2019-04-29 MED ORDER — HYDROCODONE-CHLORPHENIRAMINE 5-4 MG/5ML PO SOLN: 5.0000 mL | Freq: Four times a day (QID) | ORAL | 0 refills | Status: DC | PRN

## 2019-04-29 MED ORDER — BENZONATATE 100 MG PO CAPS: 100.0000 mg | ORAL_CAPSULE | Freq: Three times a day (TID) | ORAL | 0 refills | Status: DC | PRN

## 2019-04-29 NOTE — ED Provider Notes (Signed)
-----------------------------------------   12:46 AM on 04/29/2019 -----------------------------------------  Assumed care from [facility].  In short, Matthew Juarez is a 59 y.o. male with a chief complaint of cough with known COVID-19.  Refer to the original H&P for additional details.  The plan was to follow-up with a D-dimer.  Although the patient has known COVID-19 and prior history of pulmonary embolism, reassuringly his D-dimer is only 503, and an age-adjusted cut off would give an upper limit of normal of 600.  In the setting of COVID-19 infection this is particularly reassuring, and given that he is compliant with his Xarelto, I think that there is virtually no chance of any of his symptoms being secondary to pulmonary embolism.  He is not reporting shortness of breath nor chest pain, only cough.  He has multifocal viral pneumonia on his chest x-ray which is consistent with his COVID-19 diagnosis.  His oxygen saturation has been in the low to mid 90s and he is in no respiratory distress.  He was sleeping comfortably and without distress when I checked on him and reassessed him and went over the plan.  At this point I do not believe he would benefit from any additional imaging.  To help with his symptoms of lower respiratory infection secondary to COVID-19, I am prescribing a prednisone taper, a 6-day course of azithromycin in the hopes that it will help both with the possibility of a secondary atypical bacterial pneumonia and also as an anti-inflammatory, more cough syrup, and additional Tessalon Perles.  He was given an albuterol inhaler today.  I went over the plan with the patient and he understands and agrees and is ready to go home.  I gave my usual and customary return precautions.     Loleta Rose, MD 04/29/19 [phone]

## 2019-04-29 NOTE — Discharge Instructions (Signed)
As we discussed, although you have tested positive for COVID-19 (coronavirus), you do not need to be hospitalized at this time.  Read through all the included information including the recommendations from the CDC.  We recommend that you self-quarantine at home with your immediate family only (people with whom you have already been in contact) for 10-14 days after your fever has gone away (without taking medication to make your temperature come down, such as Tylenol (acetaminophen)), after your respiratory symptoms have improved, and after at least 14 days have passed since your symptoms first appeared.  You should have as minimal contact as possible with anyone else including close family as per the CDC paperwork guidelines listed below. Follow-up with your doctor by phone or online as needed and return immediately to the emergency department or call 911 only if you develop new or worsening symptoms that concern you.  If you were prescribed any medications, please use them as instructed.  You can find up-to-date information about COVID-19 in [place] by calling the [place] Coronavirus Helpline: 1-866-462-3821. You may also call 2-1-1, or 888-892-1162, or additional resources.  You can also find information online at https://www.ncdhhs.gov/divisions/public-health/coronavirus-disease-2019-covid-19-response-[place], or on the Center for Disease Control (CDC) website at https://www.cdc.gov/coronavirus/2019-ncov/index.html.  

## 2019-05-09 ENCOUNTER — Telehealth: Payer: Self-pay | Admitting: Family Medicine

## 2019-05-09 NOTE — Telephone Encounter (Signed)
Pt called and states that he is still having symptoms of Covid. Pt is having a fever at night time and wants to be seen today. Please advice

## 2019-05-09 NOTE — Telephone Encounter (Signed)
Patient has been scheduled to see the provider.  Mirko Tailor,cma

## 2019-05-10 ENCOUNTER — Ambulatory Visit (INDEPENDENT_AMBULATORY_CARE_PROVIDER_SITE_OTHER): Payer: Medicare Other | Admitting: Family Medicine

## 2019-05-10 ENCOUNTER — Ambulatory Visit
Admission: RE | Admit: 2019-05-10 | Discharge: 2019-05-10 | Disposition: A | Discharge status: Home / Self Care | Payer: Medicare Other | Source: Ambulatory Visit | Attending: Family Medicine | Admitting: Family Medicine

## 2019-05-10 ENCOUNTER — Other Ambulatory Visit: Payer: Self-pay

## 2019-05-10 ENCOUNTER — Encounter: Payer: Self-pay | Admitting: Family Medicine

## 2019-05-10 VITALS — Ht 77.0 in | Wt 325.0 lb

## 2019-05-10 DIAGNOSIS — U071 COVID-19: Secondary | ICD-10-CM

## 2019-05-10 DIAGNOSIS — R05 Cough: Secondary | ICD-10-CM | POA: Insufficient documentation

## 2019-05-10 DIAGNOSIS — R509 Fever, unspecified: Secondary | ICD-10-CM | POA: Diagnosis not present

## 2019-05-10 DIAGNOSIS — R059 Cough, unspecified: Secondary | ICD-10-CM

## 2019-05-10 NOTE — Assessment & Plan Note (Signed)
Patient continues to have cough and fever.  Suspect this could be related to his COVID-19 infection and I did discuss with him that it could take at least 4 weeks for symptoms to start to improve.  There could be some underlying secondary bacterial pneumonia as well particularly given lack of improvement.  We will repeat a chest x-ray.  We will contact him with those results.  Discussed if he developed high fever again, chest pain, or shortness of breath he needs to go to the emergency department.  [facility]  He will inform them once they are that he tested positive for COVID-19 on 04/12/2019.

## 2019-05-10 NOTE — Progress Notes (Signed)
Virtual Visit via telephone Note  This visit type was conducted due to national recommendations for restrictions regarding the COVID-19 pandemic (e.g. social distancing).  This format is felt to be most appropriate for this patient at this time.  All issues noted in this document were discussed and addressed.  No physical exam was performed (except for noted visual exam findings with Video Visits).   I connected with Matthew Juarez today at  8:30 AM EST by telephone and verified that I am speaking with the correct person using two identifiers. Location patient: home Location provider: home office Persons participating in the virtual visit: patient, provider  I discussed the limitations, risks, security and privacy concerns of performing an evaluation and management service by telephone and the availability of in person appointments. I also discussed with the patient that there may be a patient responsible charge related to this service. The patient expressed understanding and agreed to proceed.  Interactive audio and video telecommunications were attempted between this provider and patient, however failed, due to patient having technical difficulties OR patient did not have access to video capability.  We continued and completed visit with audio only.   Reason for visit: same day  HPI: Cough: Patient was diagnosed with COVID-19 on 04/12/2019.  He was subsequently evaluated with a chest x-ray that showed multifocal pneumonia consistent with COVID-19.  He was evaluated in the emergency department and treated with a Z-Pak and prednisone.  He continues to have issues with dry cough and intermittent fevers.  He had a T-max of 106.1 F on Saturday night.  He took 2 Tylenol and that did come down.  Over the last several days his temperature is running between 97 F and 100 F.  He does note a little bit of wheezing and crackling this.  His smell is back.  He does have fatigue.  He has had no chest pain.   He has had no shortness of breath.   ROS: See pertinent positives and negatives per HPI.  Past Medical History:  Diagnosis Date  . Arthritis   . Cardiac arrest (HCC) 06/23/2012  . Chronic back pain   . Gout   . Hammer toe   . Headache(784.0)   . History of blood clots   . Hypertension   . Pulmonary embolism (HCC)   . Pulmonary infarction (HCC) 07/23/2012   Overview:  Last Assessment & Plan:  With associated R heart failure and cardiac arrest.  - you could make an argument to treat him with lifelong coumadin given the severity of his PE, although the literature supports 12 months.  - he will be treated (at least) 12 months, to be followed by Dr Renae Gloss.  - will need hypercoag w/u a month after the coumadin is stopped - restart coumadin if at any incr  . Transverse myelitis [facility])     Past Surgical History:  Procedure Laterality Date  . HAMMER TOE SURGERY     2016    Family History  Problem Relation Age of Onset  . Dementia Mother   . Lung cancer Maternal Uncle   . Colon cancer Maternal Aunt   . Lung cancer Maternal Aunt   . Heart disease Neg Hx     SOCIAL HX: Former smoker   Current Outpatient Medications:  .  acetaminophen (TYLENOL) 500 MG tablet, Take 500 mg by mouth every 6 (six) hours as needed for mild pain., Disp: , Rfl:  .  albuterol (VENTOLIN HFA) 108 (90 Base) MCG/ACT inhaler,  Inhale 1-2 puffs into the lungs every 4 (four) hours as needed for wheezing or shortness of breath., Disp: 18 g, Rfl: 0 .  allopurinol (ZYLOPRIM) 300 MG tablet, TAKE 1 TABLET BY MOUTH EVERY DAY, Disp: 90 tablet, Rfl: 3 .  atorvastatin (LIPITOR) 40 MG tablet, TAKE 1 TABLET BY MOUTH EVERY DAY, Disp: 90 tablet, Rfl: 3 .  azithromycin (ZITHROMAX) 250 MG tablet, Take 2 tablets PO on day 1, then take 1 tablet PO daily for 4 more days, Disp: 6 each, Rfl: 0 .  benzonatate (TESSALON PERLES) 100 MG capsule, Take 1 capsule (100 mg total) by mouth 3 (three) times daily as needed for cough., Disp: 30  capsule, Rfl: 0 .  Cholecalciferol (VITAMIN D) 2000 UNITS CAPS, Take 2,000 Units by mouth daily., Disp: , Rfl:  .  Clobetasol Propionate 0.05 % lotion, APPLY TO AFFECTED AREA TWICE A DAY AS NEEDED, Disp: , Rfl:  .  Coenzyme Q10 (CO Q-10) 200 MG CAPS, Take 1 capsule by mouth daily., Disp: 30 each, Rfl: 1 .  desonide (DESOWEN) 0.05 % cream, APPLY TO AFECTED AREA TWICE A DAY AS NEEDED, Disp: , Rfl: 3 .  esomeprazole (NEXIUM) 20 MG capsule, Take 1 capsule (20 mg total) by mouth daily before breakfast., Disp: 90 capsule, Rfl: 3 .  Fluocinolone Acetonide Scalp 0.01 % OIL, APPLY TO SCALP AT BEDTIME, WASH OUT WHEN WAKE UP, Disp: , Rfl: 3 .  gabapentin (NEURONTIN) 600 MG tablet, Take 600 mg by mouth 2 (two) times daily., Disp: , Rfl:  .  HYDROcodone-Chlorpheniramine 5-4 MG/5ML SOLN, Take 5 mLs by mouth every 6 (six) hours as needed., Disp: 473 mL, Rfl: 0 .  HYDROcodone-homatropine (HYCODAN) 5-1.5 MG/5ML syrup, Take 5 mLs by mouth every 8 (eight) hours as needed for cough., Disp: 120 mL, Rfl: 0 .  ketoconazole (NIZORAL) 2 % cream, Apply 1 application topically daily., Disp: 15 g, Rfl: 0 .  Oxycodone HCl 10 MG TABS, Take by mouth., Disp: , Rfl:  .  predniSONE (DELTASONE) 10 MG tablet, Take 6 tabs (60 mg) PO x 3 days, then take 4 tabs (40 mg) PO x 3 days, then take 2 tabs (20 mg) PO x 3 days, then take 1 tab (10 mg) PO x 3 days, then take 1/2 tab (5 mg) PO x 4 days., Disp: 41 tablet, Rfl: 0 .  terbinafine (LAMISIL) 250 MG tablet, TAKE 1 TABLET BY MOUTH EVERY DAY, Disp: 30 tablet, Rfl: 0 .  XARELTO 20 MG TABS tablet, TAKE 1 TABLET (20 MG TOTAL) BY MOUTH DAILY WITH SUPPER., Disp: 90 tablet, Rfl: 3  EXAM: This is a telehealth telephone visit and thus no physical exam was completed.  ASSESSMENT AND PLAN:  Discussed the following assessment and plan:  COVID-19 Patient continues to have cough and fever.  Suspect this could be related to his COVID-19 infection and I did discuss with him that it could take at  least 4 weeks for symptoms to start to improve.  There could be some underlying secondary bacterial pneumonia as well particularly given lack of improvement.  We will repeat a chest x-ray.  We will contact him with those results.  Discussed if he developed high fever again, chest pain, or shortness of breath he needs to go to the emergency department.  [facility]  He will inform them once they are that he tested positive for COVID-19 on 04/12/2019.   Orders Placed This Encounter  Procedures  . DG Chest 2 View    Standing Status:   Future    Standing Expiration Date:   07/07/2020    Order Specific Question:   Reason for Exam (SYMPTOM  OR DIAGNOSIS REQUIRED)    Answer:   persistent cough and fever following covid19 diagnosis on 04/12/19, continues after treatment with azithromycin and prednisone starting 04/28/19    Order Specific Question:   Preferred imaging location?    Answer:   [facility]    Order Specific Question:   Radiology Contrast Protocol - do NOT remove file path    Answer:   [email or web address]    No orders of the defined types were placed in this encounter.    I discussed the assessment and treatment plan with the patient. The patient was provided an opportunity to ask questions and all were answered. The patient agreed with the plan and demonstrated an understanding of the instructions.   The patient was advised to call back or seek an in-person evaluation if the symptoms worsen or if the condition fails to improve as anticipated.  I provided 13 minutes of non-face-to-face time during this encounter.   [name], MD

## 2019-05-11 ENCOUNTER — Other Ambulatory Visit: Payer: Self-pay | Admitting: Family Medicine

## 2019-05-11 DIAGNOSIS — R9389 Abnormal findings on diagnostic imaging of other specified body structures: Secondary | ICD-10-CM

## 2019-05-12 ENCOUNTER — Telehealth: Payer: Self-pay | Admitting: Family Medicine

## 2019-05-12 ENCOUNTER — Other Ambulatory Visit: Payer: Self-pay | Admitting: Family Medicine

## 2019-05-12 NOTE — Telephone Encounter (Signed)
I called and spoke with the patient about the xray and he understood.  Matthew Juarez,cma

## 2019-05-12 NOTE — Telephone Encounter (Signed)
Pt was calling about xray-Please advise

## 2019-05-17 ENCOUNTER — Ambulatory Visit (INDEPENDENT_AMBULATORY_CARE_PROVIDER_SITE_OTHER): Payer: Medicare Other | Admitting: Family Medicine

## 2019-05-17 ENCOUNTER — Other Ambulatory Visit: Payer: Self-pay

## 2019-05-17 ENCOUNTER — Other Ambulatory Visit
Admission: RE | Admit: 2019-05-17 | Discharge: 2019-05-17 | Disposition: A | Discharge status: Home / Self Care | Payer: Medicare Other | Source: Ambulatory Visit | Attending: Family Medicine | Admitting: Family Medicine

## 2019-05-17 VITALS — Ht 77.0 in | Wt 325.0 lb

## 2019-05-17 DIAGNOSIS — U071 COVID-19: Secondary | ICD-10-CM

## 2019-05-17 DIAGNOSIS — R1110 Vomiting, unspecified: Secondary | ICD-10-CM | POA: Insufficient documentation

## 2019-05-17 DIAGNOSIS — J1282 Pneumonia due to coronavirus disease 2019: Secondary | ICD-10-CM

## 2019-05-17 DIAGNOSIS — R112 Nausea with vomiting, unspecified: Secondary | ICD-10-CM

## 2019-05-17 DIAGNOSIS — R059 Cough, unspecified: Secondary | ICD-10-CM

## 2019-05-17 DIAGNOSIS — R05 Cough: Secondary | ICD-10-CM

## 2019-05-17 LAB — COMPREHENSIVE METABOLIC PANEL
ALT: 45 U/L — ABNORMAL HIGH (ref 0–44)
AST: 42 U/L — ABNORMAL HIGH (ref 15–41)
Albumin: 3.5 g/dL (ref 3.5–5.0)
Alkaline Phosphatase: 68 U/L (ref 38–126)
Anion gap: 11 (ref 5–15)
BUN: 11 mg/dL (ref 6–20)
CO2: 26 mmol/L (ref 22–32)
Calcium: 8.8 mg/dL — ABNORMAL LOW (ref 8.9–10.3)
Chloride: 100 mmol/L (ref 98–111)
Creatinine, Ser: 0.77 mg/dL (ref 0.61–1.24)
GFR calc Af Amer: >60 mL/min (ref >60)
GFR calc non Af Amer: >60 mL/min (ref >60)
Glucose, Bld: 98 mg/dL (ref 70–99)
Potassium: 3.9 mmol/L (ref 3.5–5.1)
Sodium: 137 mmol/L (ref 135–145)
Total Bilirubin: 1.9 mg/dL — ABNORMAL HIGH (ref 0.3–1.2)
Total Protein: 6.1 g/dL — ABNORMAL LOW (ref 6.5–8.1)

## 2019-05-17 LAB — CBC WITH DIFFERENTIAL/PLATELET
Abs Immature Granulocytes: 0.01 10*3/uL (ref 0.00–0.07)
Basophils Absolute: 0 10*3/uL (ref 0.0–0.1)
Basophils Relative: 0 %
Eosinophils Absolute: 0.1 10*3/uL (ref 0.0–0.5)
Eosinophils Relative: 2 %
HCT: 38.1 % — ABNORMAL LOW (ref 39.0–52.0)
Hemoglobin: 12.3 g/dL — ABNORMAL LOW (ref 13.0–17.0)
Immature Granulocytes: 0 %
Lymphocytes Relative: 27 %
Lymphs Abs: 0.6 10*3/uL — ABNORMAL LOW (ref 0.7–4.0)
MCH: 28.3 pg (ref 26.0–34.0)
MCHC: 32.3 g/dL (ref 30.0–36.0)
MCV: 87.8 fL (ref 80.0–100.0)
Monocytes Absolute: 0.4 10*3/uL (ref 0.1–1.0)
Monocytes Relative: 17 %
Neutro Abs: 1.2 10*3/uL — ABNORMAL LOW (ref 1.7–7.7)
Neutrophils Relative %: 54 %
Platelets: 176 10*3/uL (ref 150–400)
RBC: 4.34 MIL/uL (ref 4.22–5.81)
RDW: 12.2 % (ref 11.5–15.5)
WBC: 2.3 10*3/uL — ABNORMAL LOW (ref 4.0–10.5)
nRBC: 0 % (ref 0.0–0.2)

## 2019-05-17 MED ORDER — ALBUTEROL SULFATE HFA 108 (90 BASE) MCG/ACT IN AERS: 1.0000 | INHALATION_SPRAY | RESPIRATORY_TRACT | 0 refills | Status: DC | PRN

## 2019-05-17 MED ORDER — ONDANSETRON HCL 4 MG PO TABS: 4.0000 mg | ORAL_TABLET | Freq: Three times a day (TID) | ORAL | 0 refills | Status: DC | PRN

## 2019-05-17 NOTE — Progress Notes (Signed)
Virtual Visit via telephone Note  This visit type was conducted due to national recommendations for restrictions regarding the COVID-19 pandemic (e.g. social distancing).  This format is felt to be most appropriate for this patient at this time.  All issues noted in this document were discussed and addressed.  No physical exam was performed (except for noted visual exam findings with Video Visits).   I connected with Matthew Juarez today at 11:30 AM EST by telephone and verified that I am speaking with the correct person using two identifiers. Location patient: home Location provider: work Persons participating in the virtual visit: patient, provider  I discussed the limitations, risks, security and privacy concerns of performing an evaluation and management service by telephone and the availability of in person appointments. I also discussed with the patient that there may be a patient responsible charge related to this service. The patient expressed understanding and agreed to proceed.  Interactive audio and video telecommunications were attempted between this provider and patient, however failed, due to patient having technical difficulties OR patient did not have access to video capability.  We continued and completed visit with audio only.   Reason for visit: same day visit   HPI: Vomiting: Patient notes this has been going on 2 to 3 weeks.  He notes typically only occurs about once a day.  Occurs 4 to 5 hours after he eats and he vomits up the food that he has eaten and phlegm.  He has had some nausea.  He had some diarrhea yesterday with 2-3 episodes with no blood.  No blood in his vomitus.  No abdominal pain.  He feels as though he is urinating normally though the color is dark.  He has been trying to drink plenty of fluids with 3-4 bottles of water daily.  His taste and smell are okay.  No fevers.  No vomiting today or yesterday.  He has tried taking in chicken broth and mashed potatoes  though still vomits.  He notes his cough is progressively improving.  He has had no shortness of breath.  He was previously diagnosed with COVID-19 on 04/13/2019.   ROS: See pertinent positives and negatives per HPI.  Past Medical History:  Diagnosis Date  . Arthritis   . Cardiac arrest ([place]) 06/23/2012  . Chronic back pain   . Gout   . Hammer toe   . Headache(784.0)   . History of blood clots   . Hypertension   . Pulmonary embolism ([place])   . Pulmonary infarction ([place]) 07/23/2012   Overview:  Last Assessment & Plan:  With associated R heart failure and cardiac arrest.  - you could make an argument to treat him with lifelong coumadin given the severity of his PE, although the literature supports 12 months.  - he will be treated (at least) 12 months, to be followed by Dr Karlton Lemon.  - will need hypercoag w/u a month after the coumadin is stopped - restart coumadin if at any incr  . Transverse myelitis [facility])     Past Surgical History:  Procedure Laterality Date  . HAMMER TOE SURGERY     2016    Family History  Problem Relation Age of Onset  . Dementia Mother   . Lung cancer Maternal Uncle   . Colon cancer Maternal Aunt   . Lung cancer Maternal Aunt   . Heart disease Neg Hx     SOCIAL HX: Former smoker   Current Outpatient Medications:  .  acetaminophen (TYLENOL)  500 MG tablet, Take 500 mg by mouth every 6 (six) hours as needed for mild pain., Disp: , Rfl:  .  albuterol (VENTOLIN HFA) 108 (90 Base) MCG/ACT inhaler, Inhale 1-2 puffs into the lungs every 4 (four) hours as needed for wheezing or shortness of breath., Disp: 18 g, Rfl: 0 .  allopurinol (ZYLOPRIM) 300 MG tablet, TAKE 1 TABLET BY MOUTH EVERY DAY, Disp: 90 tablet, Rfl: 3 .  atorvastatin (LIPITOR) 40 MG tablet, TAKE 1 TABLET BY MOUTH EVERY DAY, Disp: 90 tablet, Rfl: 3 .  benzonatate (TESSALON PERLES) 100 MG capsule, Take 1 capsule (100 mg total) by mouth 3 (three) times daily as needed for cough., Disp: 30 capsule, Rfl:  0 .  Cholecalciferol (VITAMIN D) 2000 UNITS CAPS, Take 2,000 Units by mouth daily., Disp: , Rfl:  .  Clobetasol Propionate 0.05 % lotion, APPLY TO AFFECTED AREA TWICE A DAY AS NEEDED, Disp: , Rfl:  .  Coenzyme Q10 (CO Q-10) 200 MG CAPS, Take 1 capsule by mouth daily., Disp: 30 each, Rfl: 1 .  desonide (DESOWEN) 0.05 % cream, APPLY TO AFECTED AREA TWICE A DAY AS NEEDED, Disp: , Rfl: 3 .  esomeprazole (NEXIUM) 20 MG capsule, Take 1 capsule (20 mg total) by mouth daily before breakfast., Disp: 90 capsule, Rfl: 3 .  Fluocinolone Acetonide Scalp 0.01 % OIL, APPLY TO SCALP AT BEDTIME, WASH OUT WHEN WAKE UP, Disp: , Rfl: 3 .  gabapentin (NEURONTIN) 600 MG tablet, Take 600 mg by mouth 2 (two) times daily., Disp: , Rfl:  .  HYDROcodone-Chlorpheniramine 5-4 MG/5ML SOLN, Take 5 mLs by mouth every 6 (six) hours as needed., Disp: 473 mL, Rfl: 0 .  HYDROcodone-homatropine (HYCODAN) 5-1.5 MG/5ML syrup, Take 5 mLs by mouth every 8 (eight) hours as needed for cough., Disp: 120 mL, Rfl: 0 .  ketoconazole (NIZORAL) 2 % cream, Apply 1 application topically daily., Disp: 15 g, Rfl: 0 .  Oxycodone HCl 10 MG TABS, Take by mouth., Disp: , Rfl:  .  predniSONE (DELTASONE) 10 MG tablet, Take 6 tabs (60 mg) PO x 3 days, then take 4 tabs (40 mg) PO x 3 days, then take 2 tabs (20 mg) PO x 3 days, then take 1 tab (10 mg) PO x 3 days, then take 1/2 tab (5 mg) PO x 4 days., Disp: 41 tablet, Rfl: 0 .  terbinafine (LAMISIL) 250 MG tablet, TAKE 1 TABLET BY MOUTH EVERY DAY, Disp: 30 tablet, Rfl: 0 .  XARELTO 20 MG TABS tablet, TAKE 1 TABLET (20 MG TOTAL) BY MOUTH DAILY WITH SUPPER., Disp: 90 tablet, Rfl: 3 .  ondansetron (ZOFRAN) 4 MG tablet, Take 1 tablet (4 mg total) by mouth every 8 (eight) hours as needed for nausea or vomiting., Disp: 20 tablet, Rfl: 0  EXAM: This is a telehealth telephone visit notes and thus no physical exam was completed.  ASSESSMENT AND PLAN:  Discussed the following assessment and plan:  Vomiting I  suspect this is related to a post Covid GI issues.  He has not vomited today or yesterday.  We will obtain lab work to evaluate for elevated WBC and also evaluate his kidney function for dehydration.  I encouraged him to increase his fluid intake.  He will continue to try bland foods.  He can take Zofran for nausea.  He will follow up early next week for recheck.  He can contact us sooner if he worsens.  Discussed reasons to seek medical attention in the ED including abdominal pain,  blood in his vomit, blood in stool, or worsening symptoms.  He will try to go today to have his lab work done at [facility].  COVID-19 Possible residual inflammation versus postinflammatory changes on prior x-ray.  He will have another x-ray in about 3 weeks.   Orders Placed This Encounter  Procedures  . Comp Met (CMET)    Standing Status:   Future    Standing Expiration Date:   05/16/2020  . CBC with Differential/Platelet    Standing Status:   Future    Standing Expiration Date:   05/16/2020    Meds ordered this encounter  Medications  . ondansetron (ZOFRAN) 4 MG tablet    Sig: Take 1 tablet (4 mg total) by mouth every 8 (eight) hours as needed for nausea or vomiting.    Dispense:  20 tablet    Refill:  0    I discussed the assessment and treatment plan with the patient. The patient was provided an opportunity to ask questions and all were answered. The patient agreed with the plan and demonstrated an understanding of the instructions.   The patient was advised to call back or seek an in-person evaluation if the symptoms worsen or if the condition fails to improve as anticipated.  I provided 14 minutes of non-face-to-face time during this encounter.   [name], MD

## 2019-05-17 NOTE — Assessment & Plan Note (Addendum)
I suspect this is related to a post Covid GI issues.  He has not vomited today or yesterday.  We will obtain lab work to evaluate for elevated WBC and also evaluate his kidney function for dehydration.  I encouraged him to increase his fluid intake.  He will continue to try bland foods.  He can take Zofran for nausea.  He will follow up early next week for recheck.  He can contact us sooner if he worsens.  Discussed reasons to seek medical attention in the ED including abdominal pain, blood in his vomit, blood in stool, or worsening symptoms.  He will try to go today to have his lab work done at [facility].

## 2019-05-17 NOTE — Assessment & Plan Note (Signed)
Possible residual inflammation versus postinflammatory changes on prior x-ray.  He will have another x-ray in about 3 weeks.

## 2019-05-18 ENCOUNTER — Telehealth: Payer: Self-pay

## 2019-05-18 DIAGNOSIS — D72819 Decreased white blood cell count, unspecified: Secondary | ICD-10-CM

## 2019-05-18 DIAGNOSIS — R7989 Other specified abnormal findings of blood chemistry: Secondary | ICD-10-CM

## 2019-05-18 NOTE — Telephone Encounter (Signed)
-----   Message from Glori Luis, MD sent at 05/18/2019 10:57 AM EST ----- Please let the patient know that his kidney function is acceptable.  His liver function tests are mildly elevated.  I would suggest repeating these in a week or so.  His white blood cell count is mildly low which could go along with his infection.  He is mildly anemic.  We could check those at that time as well.  Please place an order for a hepatic function panel for a diagnosis of elevated LFTs and a CBC with differential for a diagnosis of leukopenia.  Please also find out if his vomiting and nausea has improved.  Thanks.

## 2019-05-23 ENCOUNTER — Ambulatory Visit (INDEPENDENT_AMBULATORY_CARE_PROVIDER_SITE_OTHER): Payer: Medicare Other | Admitting: Family Medicine

## 2019-05-23 ENCOUNTER — Other Ambulatory Visit: Payer: Self-pay

## 2019-05-23 ENCOUNTER — Encounter: Payer: Self-pay | Admitting: Family Medicine

## 2019-05-23 DIAGNOSIS — D72819 Decreased white blood cell count, unspecified: Secondary | ICD-10-CM | POA: Diagnosis not present

## 2019-05-23 DIAGNOSIS — R7989 Other specified abnormal findings of blood chemistry: Secondary | ICD-10-CM | POA: Diagnosis not present

## 2019-05-23 DIAGNOSIS — R112 Nausea with vomiting, unspecified: Secondary | ICD-10-CM

## 2019-05-23 MED ORDER — CETIRIZINE HCL 10 MG PO TABS: 10.0000 mg | ORAL_TABLET | Freq: Every day | ORAL | 11 refills | Status: DC

## 2019-05-23 NOTE — Assessment & Plan Note (Signed)
No longer vomiting up food though he is vomiting up mucus which may be from his postnasal drip.  We will treat postnasal drip with Zyrtec.  He will try to come off of the Zofran and see how he does with that.  If he has recurrent nausea he can continue to use the Zofran in the short-term.  If the Zyrtec is not beneficial we could try Flonase.

## 2019-05-23 NOTE — Assessment & Plan Note (Signed)
Mildly elevated.  Likely in the setting of vomiting and prior COVID-19 infection.  Discussed rechecking these.  He has an appointment to have these rechecked on March 4 as that is when he has transportation.

## 2019-05-23 NOTE — Assessment & Plan Note (Signed)
Likely related to prior infection.  We will plan on rechecking this as discussed above.

## 2019-05-23 NOTE — Progress Notes (Signed)
Virtual Visit via telephone Note  This visit type was conducted due to national recommendations for restrictions regarding the COVID-19 pandemic (e.g. social distancing).  This format is felt to be most appropriate for this patient at this time.  All issues noted in this document were discussed and addressed.  No physical exam was performed (except for noted visual exam findings with Video Visits).   I connected with Matthew Juarez today at 10:00 AM EST by telephone and verified that I am speaking with the correct person using two identifiers. Location patient: home Location provider: work  Persons participating in the virtual visit: patient, provider  I discussed the limitations, risks, security and privacy concerns of performing an evaluation and management service by telephone and the availability of in person appointments. I also discussed with the patient that there may be a patient responsible charge related to this service. The patient expressed understanding and agreed to proceed.  Interactive audio and video telecommunications were attempted between this provider and patient, however failed, due to patient having technical difficulties OR patient did not have access to video capability.  We continued and completed visit with audio only.   Reason for visit: follow-up  HPI: Vomiting: Patient notes he is no longer vomiting up food.  He does occasionally bring up mucus that is clear.  No blood.  Some nausea though it is quite a bit improved and well treated with Zofran.  He does have quite a bit of postnasal drip.  He notes minimal cough that has been improving.  No congestion.  He will occasionally take a sinus pill and that will be beneficial.  He does have a history of postnasal drip prior to his COVID-19 infection.  He does note his appetite is coming back some.  He has no taste issues.  Elevated LFTs: Noted on labs.  No right upper quadrant pain or jaundice.  Leukopenia: Noted on  labs.  He did have a COVID-19 infection recently.   ROS: See pertinent positives and negatives per HPI.  Past Medical History:  Diagnosis Date  . Arthritis   . Cardiac arrest (HCC) 06/23/2012  . Chronic back pain   . Gout   . Hammer toe   . Headache(784.0)   . History of blood clots   . Hypertension   . Pulmonary embolism (HCC)   . Pulmonary infarction (HCC) 07/23/2012   Overview:  Last Assessment & Plan:  With associated R heart failure and cardiac arrest.  - you could make an argument to treat him with lifelong coumadin given the severity of his PE, although the literature supports 12 months.  - he will be treated (at least) 12 months, to be followed by Dr Renae Gloss.  - will need hypercoag w/u a month after the coumadin is stopped - restart coumadin if at any incr  . Transverse myelitis [facility])     Past Surgical History:  Procedure Laterality Date  . HAMMER TOE SURGERY     2016    Family History  Problem Relation Age of Onset  . Dementia Mother   . Lung cancer Maternal Uncle   . Colon cancer Maternal Aunt   . Lung cancer Maternal Aunt   . Heart disease Neg Hx     SOCIAL HX: Former smoker   Current Outpatient Medications:  .  acetaminophen (TYLENOL) 500 MG tablet, Take 500 mg by mouth every 6 (six) hours as needed for mild pain., Disp: , Rfl:  .  albuterol (VENTOLIN HFA) 108 (90  Base) MCG/ACT inhaler, Inhale 1-2 puffs into the lungs every 4 (four) hours as needed for wheezing or shortness of breath., Disp: 18 g, Rfl: 0 .  allopurinol (ZYLOPRIM) 300 MG tablet, TAKE 1 TABLET BY MOUTH EVERY DAY, Disp: 90 tablet, Rfl: 3 .  atorvastatin (LIPITOR) 40 MG tablet, TAKE 1 TABLET BY MOUTH EVERY DAY, Disp: 90 tablet, Rfl: 3 .  benzonatate (TESSALON PERLES) 100 MG capsule, Take 1 capsule (100 mg total) by mouth 3 (three) times daily as needed for cough., Disp: 30 capsule, Rfl: 0 .  Cholecalciferol (VITAMIN D) 2000 UNITS CAPS, Take 2,000 Units by mouth daily., Disp: , Rfl:  .  Clobetasol  Propionate 0.05 % lotion, APPLY TO AFFECTED AREA TWICE A DAY AS NEEDED, Disp: , Rfl:  .  Coenzyme Q10 (CO Q-10) 200 MG CAPS, Take 1 capsule by mouth daily., Disp: 30 each, Rfl: 1 .  desonide (DESOWEN) 0.05 % cream, APPLY TO AFECTED AREA TWICE A DAY AS NEEDED, Disp: , Rfl: 3 .  esomeprazole (NEXIUM) 20 MG capsule, Take 1 capsule (20 mg total) by mouth daily before breakfast., Disp: 90 capsule, Rfl: 3 .  Fluocinolone Acetonide Scalp 0.01 % OIL, APPLY TO SCALP AT BEDTIME, WASH OUT WHEN WAKE UP, Disp: , Rfl: 3 .  gabapentin (NEURONTIN) 600 MG tablet, Take 600 mg by mouth 2 (two) times daily., Disp: , Rfl:  .  HYDROcodone-Chlorpheniramine 5-4 MG/5ML SOLN, Take 5 mLs by mouth every 6 (six) hours as needed., Disp: 473 mL, Rfl: 0 .  HYDROcodone-homatropine (HYCODAN) 5-1.5 MG/5ML syrup, Take 5 mLs by mouth every 8 (eight) hours as needed for cough., Disp: 120 mL, Rfl: 0 .  ketoconazole (NIZORAL) 2 % cream, Apply 1 application topically daily., Disp: 15 g, Rfl: 0 .  ondansetron (ZOFRAN) 4 MG tablet, Take 1 tablet (4 mg total) by mouth every 8 (eight) hours as needed for nausea or vomiting., Disp: 20 tablet, Rfl: 0 .  Oxycodone HCl 10 MG TABS, Take by mouth., Disp: , Rfl:  .  predniSONE (DELTASONE) 10 MG tablet, Take 6 tabs (60 mg) PO x 3 days, then take 4 tabs (40 mg) PO x 3 days, then take 2 tabs (20 mg) PO x 3 days, then take 1 tab (10 mg) PO x 3 days, then take 1/2 tab (5 mg) PO x 4 days., Disp: 41 tablet, Rfl: 0 .  terbinafine (LAMISIL) 250 MG tablet, TAKE 1 TABLET BY MOUTH EVERY DAY, Disp: 30 tablet, Rfl: 0 .  XARELTO 20 MG TABS tablet, TAKE 1 TABLET (20 MG TOTAL) BY MOUTH DAILY WITH SUPPER., Disp: 90 tablet, Rfl: 3 .  cetirizine (ZYRTEC) 10 MG tablet, Take 1 tablet (10 mg total) by mouth daily., Disp: 30 tablet, Rfl: 11  EXAM: This is a telehealth telephone visit and thus no physical exam was completed.  ASSESSMENT AND PLAN:  Discussed the following assessment and plan:  Vomiting No longer  vomiting up food though he is vomiting up mucus which may be from his postnasal drip.  We will treat postnasal drip with Zyrtec.  He will try to come off of the Zofran and see how he does with that.  If he has recurrent nausea he can continue to use the Zofran in the short-term.  If the Zyrtec is not beneficial we could try Flonase.  Elevated LFTs Mildly elevated.  Likely in the setting of vomiting and prior COVID-19 infection.  Discussed rechecking these.  He has an appointment to have these rechecked on March 4  as that is when he has transportation.  Leukopenia Likely related to prior infection.  We will plan on rechecking this as discussed above.   No orders of the defined types were placed in this encounter.   Meds ordered this encounter  Medications  . cetirizine (ZYRTEC) 10 MG tablet    Sig: Take 1 tablet (10 mg total) by mouth daily.    Dispense:  30 tablet    Refill:  11     I discussed the assessment and treatment plan with the patient. The patient was provided an opportunity to ask questions and all were answered. The patient agreed with the plan and demonstrated an understanding of the instructions.   The patient was advised to call back or seek an in-person evaluation if the symptoms worsen or if the condition fails to improve as anticipated.  I provided 12 minutes of non-face-to-face time during this encounter.   [name], MD

## 2019-06-01 ENCOUNTER — Telehealth: Payer: Self-pay | Admitting: *Deleted

## 2019-06-01 DIAGNOSIS — R945 Abnormal results of liver function studies: Secondary | ICD-10-CM

## 2019-06-01 DIAGNOSIS — D72819 Decreased white blood cell count, unspecified: Secondary | ICD-10-CM

## 2019-06-01 DIAGNOSIS — R7989 Other specified abnormal findings of blood chemistry: Secondary | ICD-10-CM

## 2019-06-01 NOTE — Telephone Encounter (Signed)
Please place future orders for lab appt.  

## 2019-06-02 ENCOUNTER — Other Ambulatory Visit (INDEPENDENT_AMBULATORY_CARE_PROVIDER_SITE_OTHER): Payer: Medicare Other

## 2019-06-02 ENCOUNTER — Ambulatory Visit
Admission: RE | Admit: 2019-06-02 | Discharge: 2019-06-02 | Disposition: A | Discharge status: Home / Self Care | Payer: Medicare Other | Source: Ambulatory Visit | Attending: Family Medicine | Admitting: Family Medicine

## 2019-06-02 ENCOUNTER — Other Ambulatory Visit: Payer: Self-pay

## 2019-06-02 DIAGNOSIS — R918 Other nonspecific abnormal finding of lung field: Secondary | ICD-10-CM | POA: Diagnosis not present

## 2019-06-02 DIAGNOSIS — R945 Abnormal results of liver function studies: Secondary | ICD-10-CM | POA: Diagnosis not present

## 2019-06-02 DIAGNOSIS — R9389 Abnormal findings on diagnostic imaging of other specified body structures: Secondary | ICD-10-CM | POA: Diagnosis not present

## 2019-06-02 DIAGNOSIS — D72819 Decreased white blood cell count, unspecified: Secondary | ICD-10-CM

## 2019-06-02 DIAGNOSIS — R7989 Other specified abnormal findings of blood chemistry: Secondary | ICD-10-CM

## 2019-06-02 LAB — CBC WITH DIFFERENTIAL/PLATELET
Basophils Absolute: 0 10*3/uL (ref 0.0–0.1)
Basophils Relative: 0.5 % (ref 0.0–3.0)
Eosinophils Absolute: 0.1 10*3/uL (ref 0.0–0.7)
Eosinophils Relative: 1.5 % (ref 0.0–5.0)
HCT: 36.5 % — ABNORMAL LOW (ref 39.0–52.0)
Hemoglobin: 11.9 g/dL — ABNORMAL LOW (ref 13.0–17.0)
Lymphocytes Relative: 25.8 % (ref 12.0–46.0)
Lymphs Abs: 1 10*3/uL (ref 0.7–4.0)
MCHC: 32.6 g/dL (ref 30.0–36.0)
MCV: 89.1 fl (ref 78.0–100.0)
Monocytes Absolute: 0.4 10*3/uL (ref 0.1–1.0)
Monocytes Relative: 9.5 % (ref 3.0–12.0)
Neutro Abs: 2.4 10*3/uL (ref 1.4–7.7)
Neutrophils Relative %: 62.7 % (ref 43.0–77.0)
Platelets: 208 10*3/uL (ref 150.0–400.0)
RBC: 4.1 Mil/uL — ABNORMAL LOW (ref 4.22–5.81)
RDW: 13.7 % (ref 11.5–15.5)
WBC: 3.8 10*3/uL — ABNORMAL LOW (ref 4.0–10.5)

## 2019-06-02 LAB — HEPATIC FUNCTION PANEL
ALT: 25 U/L (ref 0–53)
AST: 25 U/L (ref 0–37)
Albumin: 3.6 g/dL (ref 3.5–5.2)
Alkaline Phosphatase: 79 U/L (ref 39–117)
Bilirubin, Direct: 0.3 mg/dL (ref 0.0–0.3)
Total Bilirubin: 1.6 mg/dL — ABNORMAL HIGH (ref 0.2–1.2)
Total Protein: 5.6 g/dL — ABNORMAL LOW (ref 6.0–8.3)

## 2019-06-02 NOTE — Telephone Encounter (Signed)
Ordered

## 2019-06-13 ENCOUNTER — Ambulatory Visit: Payer: Medicare Other | Admitting: Cardiovascular Disease

## 2019-06-16 ENCOUNTER — Other Ambulatory Visit: Payer: Self-pay

## 2019-06-21 ENCOUNTER — Ambulatory Visit: Payer: Medicare Other | Admitting: Family Medicine

## 2019-07-02 NOTE — Progress Notes (Signed)
Cardiology Office Note  Date:  07/04/2019   ID:  Matthew Juarez, DOB [birth year], MRN [id number]  PCP:  Glori Luis, MD   Chief Complaint  Patient presents with  . office visit    Pt states swelling in ankles only when not wearing compressions Meds verbally reviewed w/ pt.    HPI:  Matthew Juarez is a 59 y.o. male with history of  [place], immobile cardiac arrest secondary to massive PE in 05/2012 on Xarelto,  paroxysmal SVT,  03/2012 transverse myelitis, leg weakness, chronic stable prediabetes,  hyperlipidemia and obesity  Leg edema who presents for follow-up of paroxysmal SVT, history of PE  Had covid 04/2019 Sent to hospital, Main sx was a chronic Cough Treated,  2 weeks of cough,  Better now  Had vaccine  Uses walker at home Wheelchair for distance  Weight down 35 pounds  Labs reviewed Total bili 1.9   Echo in 05/2016 showed an EF of 60 to 65%, no regional wall motion rise, grade 1 diastolic dysfunction, normal size left atrium,    uses a motorized wheelchair outside of the home and a walker around the house.   denied any recent falls, syncope, or feelings of pre-syncope.   He has chronic bilateral lower extremity edema Uses compression stockings,  medication compliance with his Xarelto.   EKG personally reviewed by myself on todays visit nsr 64 bpm normal   PMH:   has a past medical history of Arthritis, Cardiac arrest (HCC) (06/23/2012), Chronic back pain, Gout, Hammer toe, Headache(784.0), History of blood clots, Hypertension, Pulmonary embolism (HCC), Pulmonary infarction (HCC) (07/23/2012), and Transverse myelitis (HCC).  PSH:    Past Surgical History:  Procedure Laterality Date  . HAMMER TOE SURGERY     2016    Current Outpatient Medications  Medication Sig Dispense Refill  . acetaminophen (TYLENOL) 500 MG tablet Take 500 mg by mouth every 6 (six) hours as needed for mild pain.    Marland Kitchen albuterol (VENTOLIN HFA) 108 (90 Base) MCG/ACT inhaler Inhale 1-2  puffs into the lungs every 4 (four) hours as needed for wheezing or shortness of breath. 18 g 0  . allopurinol (ZYLOPRIM) 300 MG tablet TAKE 1 TABLET BY MOUTH EVERY DAY 90 tablet 3  . atorvastatin (LIPITOR) 40 MG tablet TAKE 1 TABLET BY MOUTH EVERY DAY 90 tablet 3  . benzonatate (TESSALON PERLES) 100 MG capsule Take 1 capsule (100 mg total) by mouth 3 (three) times daily as needed for cough. 30 capsule 0  . cetirizine (ZYRTEC) 10 MG tablet Take 1 tablet (10 mg total) by mouth daily. 30 tablet 11  . Cholecalciferol (VITAMIN D) 2000 UNITS CAPS Take 2,000 Units by mouth daily.    . Clobetasol Propionate 0.05 % lotion APPLY TO AFFECTED AREA TWICE A DAY AS NEEDED    . Coenzyme Q10 (CO Q-10) 200 MG CAPS Take 1 capsule by mouth daily. 30 each 1  . desonide (DESOWEN) 0.05 % cream APPLY TO AFECTED AREA TWICE A DAY AS NEEDED  3  . esomeprazole (NEXIUM) 20 MG capsule Take 1 capsule (20 mg total) by mouth daily before breakfast. 90 capsule 3  . Fluocinolone Acetonide Scalp 0.01 % OIL APPLY TO SCALP AT BEDTIME, WASH OUT WHEN WAKE UP  3  . gabapentin (NEURONTIN) 600 MG tablet Take 600 mg by mouth 2 (two) times daily.    Marland Kitchen HYDROcodone-Chlorpheniramine 5-4 MG/5ML SOLN Take 5 mLs by mouth every 6 (six) hours as needed. 473 mL 0  .  HYDROcodone-homatropine (HYCODAN) 5-1.5 MG/5ML syrup Take 5 mLs by mouth every 8 (eight) hours as needed for cough. 120 mL 0  . ketoconazole (NIZORAL) 2 % cream Apply 1 application topically daily. 15 g 0  . ondansetron (ZOFRAN) 4 MG tablet Take 1 tablet (4 mg total) by mouth every 8 (eight) hours as needed for nausea or vomiting. 20 tablet 0  . Oxycodone HCl 10 MG TABS Take by mouth.    . predniSONE (DELTASONE) 10 MG tablet Take 6 tabs (60 mg) PO x 3 days, then take 4 tabs (40 mg) PO x 3 days, then take 2 tabs (20 mg) PO x 3 days, then take 1 tab (10 mg) PO x 3 days, then take 1/2 tab (5 mg) PO x 4 days. 41 tablet 0  . terbinafine (LAMISIL) 250 MG tablet TAKE 1 TABLET BY MOUTH EVERY DAY  30 tablet 0  . XARELTO 20 MG TABS tablet TAKE 1 TABLET (20 MG TOTAL) BY MOUTH DAILY WITH SUPPER. 90 tablet 3   No current facility-administered medications for this visit.     Allergies:   Patient has no known allergies.   Social History:  The patient  reports that he quit smoking about 36 years ago. His smoking use included cigarettes. He quit after 1.00 year of use. He has never used smokeless tobacco. He reports that he does not drink alcohol or use drugs.   Family History:   family history includes Colon cancer in his maternal aunt; Dementia in his mother; Lung cancer in his maternal aunt and maternal uncle.    Review of Systems: Review of Systems  Constitutional: Negative.   HENT: Negative.   Respiratory: Negative.   Cardiovascular: Negative.   Gastrointestinal: Negative.   Musculoskeletal: Negative.   Neurological: Negative.   Psychiatric/Behavioral: Negative.   All other systems reviewed and are negative.   PHYSICAL EXAM: VS:  BP 110/64 (BP Location: Left Arm, Patient Position: Sitting, Cuff Size: Normal)   Pulse 64   Ht 6\' 5"  (1.956 m)   Wt 287 lb (130.2 kg)   SpO2 96%   BMI 34.03 kg/m  , BMI Body mass index is 34.03 kg/m. GEN: Well nourished, well developed, in no acute distress HEENT: normal Neck: no JVD, carotid bruits, or masses Cardiac: RRR; no murmurs, rubs, or gallops,no edema  Respiratory:  clear to auscultation bilaterally, normal work of breathing GI: soft, nontender, nondistended, + BS MS: no deformity or atrophy Skin: warm and dry, no rash Neuro:  Strength and sensation are intact Psych: euthymic mood, full affect    Recent Labs: 05/17/2019: BUN 11; Creatinine, Ser 0.77; Potassium 3.9; Sodium 137 06/02/2019: ALT 25; Hemoglobin 11.9; Platelets 208.0    Lipid Panel Lab Results  Component Value Date   CHOL 131 11/09/2018   HDL 44.90 11/09/2018   LDLCALC 70 11/09/2018   TRIG 78.0 11/09/2018      Wt Readings from Last 3 Encounters:   07/04/19 287 lb (130.2 kg)  05/23/19 (!) 325 lb (147.4 kg)  05/17/19 (!) 325 lb (147.4 kg)      ASSESSMENT AND PLAN:  Problem List Items Addressed This Visit      Cardiology Problems   Hyperlipidemia   Coronary artery calcification     Other   History of pulmonary embolism    Other Visit Diagnoses    PSVT (paroxysmal supraventricular tachycardia) (HCC)    -  Primary   Relevant Orders   EKG 12-Lead     Paroxysmal tachycardia No recent  episodes, currently not on any rate control medications  History of PE In 2014, had a fall leg injury was immobile Developed DVT and PE Is on chronic anticoagulation  Hyperlipidemia Numbers well controlled also with recent 30 pound weight loss from Covid and not eating  Coronary artery calcification Denies anginal symptoms, will continue current aggressive lipid management  Disposition:   F/U  12 months   Total encounter time more than 25 minutes  Greater than 50% was spent in counseling and coordination of care with the patient    Signed, Dossie Arbour, M.D., Ph.D. [facility] [place], [place] [phone]

## 2019-07-04 ENCOUNTER — Ambulatory Visit (INDEPENDENT_AMBULATORY_CARE_PROVIDER_SITE_OTHER): Payer: Medicare Other | Admitting: Cardiovascular Disease

## 2019-07-04 ENCOUNTER — Encounter: Payer: Self-pay | Admitting: Cardiovascular Disease

## 2019-07-04 ENCOUNTER — Other Ambulatory Visit: Payer: Self-pay

## 2019-07-04 VITALS — BP 110/64 | HR 64 | Ht 77.0 in | Wt 287.0 lb

## 2019-07-04 DIAGNOSIS — I2584 Coronary atherosclerosis due to calcified coronary lesion: Secondary | ICD-10-CM | POA: Diagnosis not present

## 2019-07-04 DIAGNOSIS — E785 Hyperlipidemia, unspecified: Secondary | ICD-10-CM | POA: Diagnosis not present

## 2019-07-04 DIAGNOSIS — I2510 Atherosclerotic heart disease of native coronary artery without angina pectoris: Secondary | ICD-10-CM | POA: Diagnosis not present

## 2019-07-04 DIAGNOSIS — I471 Supraventricular tachycardia: Secondary | ICD-10-CM | POA: Diagnosis not present

## 2019-07-04 DIAGNOSIS — M25512 Pain in left shoulder: Secondary | ICD-10-CM | POA: Diagnosis not present

## 2019-07-04 DIAGNOSIS — Z86711 Personal history of pulmonary embolism: Secondary | ICD-10-CM | POA: Diagnosis not present

## 2019-07-04 DIAGNOSIS — G8929 Other chronic pain: Secondary | ICD-10-CM | POA: Diagnosis not present

## 2019-07-04 DIAGNOSIS — Z79891 Long term (current) use of opiate analgesic: Secondary | ICD-10-CM | POA: Diagnosis not present

## 2019-07-04 NOTE — Patient Instructions (Addendum)

## 2019-07-06 ENCOUNTER — Telehealth: Payer: Self-pay

## 2019-07-06 NOTE — Telephone Encounter (Signed)
Noted  

## 2019-07-07 ENCOUNTER — Telehealth: Payer: Self-pay

## 2019-07-07 ENCOUNTER — Ambulatory Visit (INDEPENDENT_AMBULATORY_CARE_PROVIDER_SITE_OTHER): Payer: Medicare Other | Admitting: Family Medicine

## 2019-07-07 ENCOUNTER — Encounter: Payer: Self-pay | Admitting: Family Medicine

## 2019-07-07 ENCOUNTER — Other Ambulatory Visit: Payer: Self-pay

## 2019-07-07 VITALS — BP 100/70 | HR 63 | Temp 96.6°F | Ht 77.0 in | Wt 287.0 lb

## 2019-07-07 DIAGNOSIS — D72819 Decreased white blood cell count, unspecified: Secondary | ICD-10-CM

## 2019-07-07 DIAGNOSIS — Z86711 Personal history of pulmonary embolism: Secondary | ICD-10-CM

## 2019-07-07 DIAGNOSIS — R112 Nausea with vomiting, unspecified: Secondary | ICD-10-CM

## 2019-07-07 DIAGNOSIS — U071 COVID-19: Secondary | ICD-10-CM

## 2019-07-07 DIAGNOSIS — K219 Gastro-esophageal reflux disease without esophagitis: Secondary | ICD-10-CM | POA: Diagnosis not present

## 2019-07-07 DIAGNOSIS — G360 Neuromyelitis optica [Devic]: Secondary | ICD-10-CM | POA: Diagnosis not present

## 2019-07-07 DIAGNOSIS — R634 Abnormal weight loss: Secondary | ICD-10-CM | POA: Diagnosis not present

## 2019-07-07 LAB — COMPREHENSIVE METABOLIC PANEL
ALT: 18 U/L (ref 0–53)
AST: 17 U/L (ref 0–37)
Albumin: 4.2 g/dL (ref 3.5–5.2)
Alkaline Phosphatase: 82 U/L (ref 39–117)
BUN: 9 mg/dL (ref 6–23)
CO2: 29 mEq/L (ref 19–32)
Calcium: 9.3 mg/dL (ref 8.4–10.5)
Chloride: 104 mEq/L (ref 96–112)
Creatinine, Ser: 0.7 mg/dL (ref 0.40–1.50)
GFR: 138.87 mL/min (ref >60.00)
Glucose, Bld: 84 mg/dL (ref 70–99)
Potassium: 3.7 mEq/L (ref 3.5–5.1)
Sodium: 140 mEq/L (ref 135–145)
Total Bilirubin: 1.5 mg/dL — ABNORMAL HIGH (ref 0.2–1.2)
Total Protein: 5.9 g/dL — ABNORMAL LOW (ref 6.0–8.3)

## 2019-07-07 LAB — CBC WITH DIFFERENTIAL/PLATELET
Basophils Absolute: 0 10*3/uL (ref 0.0–0.1)
Basophils Relative: 0.5 % (ref 0.0–3.0)
Eosinophils Absolute: 0.2 10*3/uL (ref 0.0–0.7)
Eosinophils Relative: 5.3 % — ABNORMAL HIGH (ref 0.0–5.0)
HCT: 38.1 % — ABNORMAL LOW (ref 39.0–52.0)
Hemoglobin: 12.6 g/dL — ABNORMAL LOW (ref 13.0–17.0)
Lymphocytes Relative: 22 % (ref 12.0–46.0)
Lymphs Abs: 1 10*3/uL (ref 0.7–4.0)
MCHC: 33.1 g/dL (ref 30.0–36.0)
MCV: 89.5 fl (ref 78.0–100.0)
Monocytes Absolute: 0.4 10*3/uL (ref 0.1–1.0)
Monocytes Relative: 8.1 % (ref 3.0–12.0)
Neutro Abs: 2.9 10*3/uL (ref 1.4–7.7)
Neutrophils Relative %: 64.1 % (ref 43.0–77.0)
Platelets: 228 10*3/uL (ref 150.0–400.0)
RBC: 4.25 Mil/uL (ref 4.22–5.81)
RDW: 15 % (ref 11.5–15.5)
WBC: 4.6 10*3/uL (ref 4.0–10.5)

## 2019-07-07 LAB — TSH: TSH: 1.06 u[IU]/mL (ref 0.35–4.50)

## 2019-07-07 LAB — SEDIMENTATION RATE: Sed Rate: 10 mm/hr (ref 0–20)

## 2019-07-07 NOTE — Assessment & Plan Note (Signed)
Resolved  Appetite has improved

## 2019-07-07 NOTE — Progress Notes (Addendum)
[name], MD Phone: [phone]  Matthew Juarez is a 59 y.o. male who presents today for f/u.  Mobility assessment for power wheel chair: This is the primary reason for this visit.  The patient uses a power wheelchair.  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  He notes the power wheelchair allows him to cook and clean for himself as well as go to the bathroom on his own.  He is not able to ambulate adequately to accomplish ADLs such as cooking and cleaning for himself as well as going to the bathroom.  The patient has mental capacity to operate the power wheelchair safely and is willing to use this in the home.  HYPERLIPIDEMIA Symptoms Chest pain on exertion:  no    Medications: Compliance- taking lipitor Right upper quadrant pain- no  Muscle aches- no  GERD:   Reflux symptoms: no   Abd pain: no   Blood in stool: no  Dysphagia: no   EGD: no history of this  Medication: nexium for a long time  Weight loss: Patient went from 325 pounds down to 287 pounds.  He was not trying to lose weight.  He was sick with Covid during January and February when he feels that his lack of appetite and nausea and vomiting contributed to his weight loss.  He notes no sweats.  No itching.  His appetite has improved and he is eating well.  History of PE: Continues on Xarelto.  No shortness of breath.  No swelling.  No bleeding issues.     Social History   Tobacco Use  Smoking Status Former Smoker  . Years: 1.00  . Types: Cigarettes  . Quit date: 04/01/1983  . Years since quitting: 36.2  Smokeless Tobacco Never Used  Tobacco Comment   only snmoke 2- cigsper day when he smoked     ROS see history of present illness  Objective  Physical Exam Vitals:   07/07/19 1108  BP: 100/70  Pulse: 63  Temp: (!) 96.6 F (35.9 C)  SpO2: 99%   Height 6 feet 5 inches, weight 287 pounds  BP Readings from Last 3 Encounters:    07/07/19 100/70  07/04/19 110/64  04/29/19 113/77   Wt Readings from Last 3 Encounters:  07/07/19 287 lb (130.2 kg)  07/04/19 287 lb (130.2 kg)  05/23/19 (!) 325 lb (147.4 kg)    Physical Exam Constitutional:      General: He is not in acute distress.    Appearance: He is not diaphoretic.  Cardiovascular:     Rate and Rhythm: Normal rate and regular rhythm.     Heart sounds: Normal heart sounds.  Pulmonary:     Effort: Pulmonary effort is normal.     Breath sounds: Normal breath sounds.  Skin:    General: Skin is warm and dry.  Neurological:     Mental Status: He is alert.     Comments: Patient assistance himself up from a seated position in his wheelchair by using his arms      Assessment/Plan: Please see individual problem list.  Neuromyelitis optica ([place]) Mobility assessment for power wheelchair today.  Patient does have mobility issues relating to the neuromyelitis optica.  He has completed physical therapy and we are requesting a note for me to review.  An addendum will follow once we receive this.  Vomiting Resolved.  Appetite has improved.  GERD (gastroesophageal reflux disease) Adequate control on Nexium.  COVID-19 Patient has recovered.  Weight loss I suspect this is related to his prior decreased appetite, nausea, and vomiting following his COVID-19 illness.  We will check basic labs.  He will follow-up in about 3 weeks for weight check.  Leukopenia Recheck today.  Suspect this was related to his prior infection.  History of pulmonary embolism Continue Xarelto.   Orders Placed This Encounter  Procedures  . CBC w/Diff  . Comp Met (CMET)  . TSH  . Sedimentation rate  . POCT Urinalysis Dipstick    No orders of the defined types were placed in this encounter.   This visit occurred during the SARS-CoV-2 public health emergency.  Safety protocols were in place, including screening questions prior to the visit, additional usage of staff PPE, and  extensive cleaning of exam room while observing appropriate contact time as indicated for disinfecting solutions.    Addendum: Physical therapy wheelchair assessment reviewed.  I have read the PT evaluation and concur with the PT evaluation.  [name], MD [place]

## 2019-07-07 NOTE — Assessment & Plan Note (Signed)
Recheck today.  Suspect this was related to his prior infection.

## 2019-07-07 NOTE — Assessment & Plan Note (Signed)
Mobility assessment for power wheelchair today.  Patient does have mobility issues relating to the neuromyelitis optica.  He has completed physical therapy and we are requesting a note for me to review.  An addendum will follow once we receive this.

## 2019-07-07 NOTE — Assessment & Plan Note (Signed)
Patient has recovered.

## 2019-07-07 NOTE — Assessment & Plan Note (Signed)
Continue Xarelto 

## 2019-07-07 NOTE — Assessment & Plan Note (Signed)
Adequate control on Nexium.

## 2019-07-07 NOTE — Assessment & Plan Note (Signed)
I suspect this is related to his prior decreased appetite, nausea, and vomiting following his COVID-19 illness.  We will check basic labs.  He will follow-up in about 3 weeks for weight check.

## 2019-07-07 NOTE — Patient Instructions (Signed)
Nice to see you.  We will get labs today.  We will contact you with the results.

## 2019-07-07 NOTE — Progress Notes (Signed)
I called to physical therapy in [place] and they are faxing the form to [place].  Matthew Juarez,cma

## 2019-07-08 NOTE — Telephone Encounter (Signed)
Forms and notes faxed today to [employer] today.  Matthew Juarez,cma

## 2019-07-08 NOTE — Telephone Encounter (Signed)
Noted. Forms reviewed and completed. Coralee [facility] will fax.

## 2019-07-13 LAB — POCT URINALYSIS DIPSTICK
Bilirubin, UA: NEGATIVE
Blood, UA: NEGATIVE
Glucose, UA: NEGATIVE
Ketones, UA: NEGATIVE
Nitrite, UA: POSITIVE
Protein, UA: NEGATIVE
Spec Grav, UA: >=1.03 — AB (ref 1.010–1.025)
Urobilinogen, UA: 2 E.U./dL — AB
pH, UA: 6 (ref 5.0–8.0)

## 2019-07-13 NOTE — Addendum Note (Signed)
Addended by: Bonnell Public I on: 07/13/2019 09:14 AM   Modules accepted: Orders

## 2019-07-17 ENCOUNTER — Other Ambulatory Visit: Payer: Self-pay

## 2019-07-17 ENCOUNTER — Emergency Department
Admission: EM | Admit: 2019-07-17 | Discharge: 2019-07-17 | Disposition: A | Discharge status: Home / Self Care | Payer: Medicare Other | Attending: Emergency Medicine | Admitting: Emergency Medicine

## 2019-07-17 DIAGNOSIS — Z87891 Personal history of nicotine dependence: Secondary | ICD-10-CM | POA: Insufficient documentation

## 2019-07-17 DIAGNOSIS — R5083 Postvaccination fever: Secondary | ICD-10-CM | POA: Diagnosis not present

## 2019-07-17 DIAGNOSIS — Z8616 Personal history of COVID-19: Secondary | ICD-10-CM | POA: Insufficient documentation

## 2019-07-17 DIAGNOSIS — I110 Hypertensive heart disease with heart failure: Secondary | ICD-10-CM | POA: Diagnosis not present

## 2019-07-17 DIAGNOSIS — I509 Heart failure, unspecified: Secondary | ICD-10-CM | POA: Insufficient documentation

## 2019-07-17 DIAGNOSIS — Z79899 Other long term (current) drug therapy: Secondary | ICD-10-CM | POA: Insufficient documentation

## 2019-07-17 DIAGNOSIS — R509 Fever, unspecified: Secondary | ICD-10-CM | POA: Diagnosis not present

## 2019-07-17 DIAGNOSIS — T50Z95A Adverse effect of other vaccines and biological substances, initial encounter: Secondary | ICD-10-CM

## 2019-07-17 MED ORDER — ACETAMINOPHEN 325 MG PO TABS
ORAL_TABLET | ORAL | Status: AC
  Filled 2019-07-17: qty 2

## 2019-07-17 MED ORDER — ACETAMINOPHEN 325 MG PO TABS
650.0000 mg | ORAL_TABLET | Freq: Once | ORAL | Status: AC
  Administered 2019-07-17: 650 mg via ORAL

## 2019-07-17 NOTE — ED Provider Notes (Signed)
[facility] Emergency Department Provider Note   ____________________________________________   First MD Initiated Contact with Patient 07/17/19 [phone]     (approximate)  I have reviewed the triage vital signs and the nursing notes.   HISTORY  Chief Complaint Fever    HPI Matthew Juarez is a 59 y.o. male who presents to the ED from home with a chief complaint of fever and chills.  Patient received his second Moderna Covid vaccine around 8 AM.  Started to run low-grade fever 100.8 F around 5 PM with chills.  Did not take antipyretics prior to arrival.  Denies cough, chest pain, shortness of breath, abdominal pain, nausea, vomiting, dysuria or diarrhea.       Past Medical History:  Diagnosis Date  . Arthritis   . Cardiac arrest (HCC) 06/23/2012  . Chronic back pain   . Gout   . Hammer toe   . Headache(784.0)   . History of blood clots   . Hypertension   . Pulmonary embolism (HCC)   . Pulmonary infarction (HCC) 07/23/2012   Overview:  Last Assessment & Plan:  With associated R heart failure and cardiac arrest.  - you could make an argument to treat him with lifelong coumadin given the severity of his PE, although the literature supports 12 months.  - he will be treated (at least) 12 months, to be followed by Dr Renae Gloss.  - will need hypercoag w/u a month after the coumadin is stopped - restart coumadin if at any incr  . Transverse myelitis [facility])     Patient Active Problem List   Diagnosis Date Noted  . Weight loss 07/07/2019  . Elevated LFTs 05/23/2019  . Leukopenia 05/23/2019  . Vomiting 05/17/2019  . COVID-19 04/19/2019  . Bruise 12/22/2018  . Olecranon bursitis of left elbow 10/29/2018  . Fatty liver 10/26/2017  . Chronic left shoulder pain 03/19/2017  . GERD (gastroesophageal reflux disease) 10/23/2016  . Obesity 06/30/2016  . Right heart failure (HCC) 04/30/2016  . Coronary artery calcification 04/30/2016  . Palpitations 04/14/2016  .  Hyperlipidemia 01/16/2016  . Prediabetes 09/13/2015  . Gout 08/10/2015  . History of hypertension 08/10/2015  . Chronic pain syndrome 07/11/2015  . Chronic, continuous use of opioids 05/05/2013  . History of pulmonary embolism 07/23/2012  . 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 Closed right ankle fracture 04/19/2012  . Chronic back pain     Past Surgical History:  Procedure Laterality Date  . HAMMER TOE SURGERY     2016    Prior to Admission medications   Medication Sig Start Date End Date Taking? Authorizing Provider  acetaminophen (TYLENOL) 500 MG tablet Take 500 mg by mouth every 6 (six) hours as needed for mild pain.    [provider]  albuterol (VENTOLIN HFA) 108 (90 Base) MCG/ACT inhaler Inhale 1-2 puffs into the lungs every 4 (four) hours as needed for wheezing or shortness of breath. 05/17/19   Glori Luis, MD  allopurinol (ZYLOPRIM) 300 MG tablet TAKE 1 TABLET BY MOUTH EVERY DAY 01/06/19   Glori Luis, MD  atorvastatin (LIPITOR) 40 MG tablet TAKE 1 TABLET BY MOUTH EVERY DAY 01/10/19   Glori Luis, MD  benzonatate (TESSALON PERLES) 100 MG capsule Take 1 capsule (100 mg total) by mouth 3 (three) times daily as needed for cough. 04/29/19   Loleta Rose, MD  cetirizine (ZYRTEC) 10 MG tablet Take 1 tablet (10 mg total) by mouth daily. 05/23/19   [name]  G, MD  Cholecalciferol (VITAMIN D) 2000 UNITS CAPS Take 2,000 Units by mouth daily.    [provider]  Clobetasol Propionate 0.05 % lotion APPLY TO AFFECTED AREA TWICE A DAY AS NEEDED 10/20/16   [provider]  Coenzyme Q10 (CO Q-10) 200 MG CAPS Take 1 capsule by mouth daily. 05/11/12   Angiulli, Lavon Paganini, PA-C  desonide (DESOWEN) 0.05 % cream APPLY TO AFECTED AREA TWICE A DAY AS NEEDED 05/21/17   [provider]  esomeprazole (NEXIUM) 20 MG capsule Take 1 capsule (20 mg total) by mouth daily before breakfast. 10/23/16   Leone Haven, MD  Fluocinolone Acetonide  Scalp 0.01 % OIL APPLY TO SCALP AT BEDTIME, [place] OUT WHEN WAKE UP 05/21/17   [provider]  gabapentin (NEURONTIN) 600 MG tablet Take 600 mg by mouth 2 (two) times daily.    [provider]  HYDROcodone-Chlorpheniramine 5-4 MG/5ML SOLN Take 5 mLs by mouth every 6 (six) hours as needed. 04/29/19   Hinda Kehr, MD  HYDROcodone-homatropine [facility]) 5-1.5 MG/5ML syrup Take 5 mLs by mouth every 8 (eight) hours as needed for cough. 04/22/19   Leone Haven, MD  ketoconazole (NIZORAL) 2 % cream Apply 1 application topically daily. 06/30/16   Leone Haven, MD  ondansetron (ZOFRAN) 4 MG tablet Take 1 tablet (4 mg total) by mouth every 8 (eight) hours as needed for nausea or vomiting. 05/17/19   Leone Haven, MD  Oxycodone HCl 10 MG TABS Take by mouth. 06/06/15   [provider]  predniSONE (DELTASONE) 10 MG tablet Take 6 tabs (60 mg) PO x 3 days, then take 4 tabs (40 mg) PO x 3 days, then take 2 tabs (20 mg) PO x 3 days, then take 1 tab (10 mg) PO x 3 days, then take 1/2 tab (5 mg) PO x 4 days. 04/29/19   Hinda Kehr, MD  terbinafine (LAMISIL) 250 MG tablet TAKE 1 TABLET BY MOUTH EVERY DAY 03/17/19   Regal, Tamala Fothergill, DPM  XARELTO 20 MG TABS tablet TAKE 1 TABLET (20 MG TOTAL) BY MOUTH DAILY WITH SUPPER. 02/14/19   Leone Haven, MD    Allergies Patient has no known allergies.  Family History  Problem Relation Age of Onset  . Dementia Mother   . Lung cancer Maternal Uncle   . Colon cancer Maternal Aunt   . Lung cancer Maternal Aunt   . Heart disease Neg Hx     Social History Social History   Tobacco Use  . Smoking status: Former Smoker    Years: 1.00    Types: Cigarettes    Quit date: 04/01/1983    Years since quitting: 36.3  . Smokeless tobacco: Never Used  . Tobacco comment: only snmoke 2- cigsper day when he smoked  Substance Use Topics  . Alcohol use: No    Alcohol/week: 0.0 standard drinks  . Drug use: No    Review of  Systems  Constitutional: Positive for fever/chills Eyes: No visual changes. ENT: No sore throat. Cardiovascular: Denies chest pain. Respiratory: Denies shortness of breath. Gastrointestinal: No abdominal pain.  No nausea, no vomiting.  No diarrhea.  No constipation. Genitourinary: Negative for dysuria. Musculoskeletal: Negative for back pain. Skin: Negative for rash. Neurological: Negative for headaches, focal weakness or numbness.   ____________________________________________   PHYSICAL EXAM:  VITAL SIGNS: ED Triage Vitals  Enc Vitals Group     BP 07/17/19 0023 119/73     Pulse Rate 07/17/19 0023 88  Resp 07/17/19 0023 18     Temp 07/17/19 0023 (!) 100.4 F (38 C)     Temp Source 07/17/19 0023 Oral     SpO2 07/17/19 0023 98 %     Weight 07/17/19 0021 285 lb (129.3 kg)     Height 07/17/19 0021 6\' 5"  (1.956 m)     Head Circumference --      Peak Flow --      Pain Score 07/17/19 0021 0     Pain Loc --      Pain Edu? --      Excl. in GC? --     Constitutional: Alert and oriented. Well appearing and in no acute distress. Eyes: Conjunctivae are normal. PERRL. EOMI. Head: Atraumatic. Nose: No congestion/rhinnorhea. Mouth/Throat: Mucous membranes are moist.   Neck: No stridor.  Supple neck without meningismus. Cardiovascular: Normal rate, regular rhythm. Grossly normal heart sounds.  Good peripheral circulation. Respiratory: Normal respiratory effort.  No retractions. Lungs CTAB. Gastrointestinal: Soft and nontender to light or deep palpation. No distention. No abdominal bruits. No CVA tenderness. Musculoskeletal: No lower extremity tenderness nor edema.  No joint effusions. Neurologic:  Normal speech and language. No gross focal neurologic deficits are appreciated.  Skin:  Skin is warm, dry and intact. No rash noted.  No petechiae. Psychiatric: Mood and affect are normal. Speech and behavior are normal.  ____________________________________________   LABS (all  labs ordered are listed, but only abnormal results are displayed)  Labs Reviewed - No data to display ____________________________________________  EKG  None ____________________________________________  RADIOLOGY  ED MD interpretation: None  Official radiology report(s): No results found.  ____________________________________________   PROCEDURES  Procedure(s) performed (including Critical Care):  Procedures   ____________________________________________   INITIAL IMPRESSION / ASSESSMENT AND PLAN / ED COURSE  As part of my medical decision making, I reviewed the following data within the electronic MEDICAL RECORD NUMBER Nursing notes reviewed and incorporated, Old chart reviewed and Notes from prior ED visits     Matthew Juarez was evaluated in Emergency Department on 07/17/2019 for the symptoms described in the history of present illness. He was evaluated in the context of the global COVID-19 pandemic, which necessitated consideration that the patient might be at risk for infection with the SARS-CoV-2 virus that causes COVID-19. Institutional protocols and algorithms that pertain to the evaluation of patients at risk for COVID-19 are in a state of rapid change based on information released by regulatory bodies including the CDC and federal and state organizations. These policies and algorithms were followed during the patient's care in the ED.    59 year old male presenting with fever and chills after second Covid vaccination without other symptoms.  Feeling significantly better after Tylenol administered at triage.  Strict return precautions given.  Patient verbalizes understanding and agrees with plan of care.      ____________________________________________   FINAL CLINICAL IMPRESSION(S) / ED DIAGNOSES  Final diagnoses:  Vaccine reaction, initial encounter     ED Discharge Orders    None       Note:  This document was prepared using Dragon voice recognition  software and may include unintentional dictation errors.   65, MD 07/17/19 [phone]

## 2019-07-17 NOTE — ED Triage Notes (Signed)
Patient reports had 2nd COVID vaccine earlier in the day and around 5 pm started with fever and chills.

## 2019-07-17 NOTE — Discharge Instructions (Addendum)
You may alternate Tylenol and/or Ibuprofen as needed for fever.  Return to the ER for worsening symptoms, persistent vomiting, difficulty breathing or other concerns.

## 2019-07-21 ENCOUNTER — Other Ambulatory Visit: Payer: Self-pay | Admitting: Podiatry

## 2019-07-27 DIAGNOSIS — G894 Chronic pain syndrome: Secondary | ICD-10-CM | POA: Diagnosis not present

## 2019-07-27 DIAGNOSIS — Z79899 Other long term (current) drug therapy: Secondary | ICD-10-CM | POA: Diagnosis not present

## 2019-07-27 DIAGNOSIS — G360 Neuromyelitis optica [Devic]: Secondary | ICD-10-CM | POA: Diagnosis not present

## 2019-07-29 ENCOUNTER — Encounter: Payer: Self-pay | Admitting: Family Medicine

## 2019-07-29 ENCOUNTER — Other Ambulatory Visit (INDEPENDENT_AMBULATORY_CARE_PROVIDER_SITE_OTHER): Payer: Medicare Other

## 2019-07-29 ENCOUNTER — Other Ambulatory Visit: Payer: Self-pay

## 2019-07-29 ENCOUNTER — Ambulatory Visit (INDEPENDENT_AMBULATORY_CARE_PROVIDER_SITE_OTHER): Payer: Medicare Other | Admitting: Family Medicine

## 2019-07-29 VITALS — BP 140/70 | HR 68 | Temp 97.3°F | Ht 77.0 in | Wt 281.6 lb

## 2019-07-29 DIAGNOSIS — R112 Nausea with vomiting, unspecified: Secondary | ICD-10-CM

## 2019-07-29 DIAGNOSIS — R7303 Prediabetes: Secondary | ICD-10-CM | POA: Diagnosis not present

## 2019-07-29 DIAGNOSIS — R634 Abnormal weight loss: Secondary | ICD-10-CM

## 2019-07-29 DIAGNOSIS — R8290 Unspecified abnormal findings in urine: Secondary | ICD-10-CM

## 2019-07-29 DIAGNOSIS — Z6833 Body mass index (BMI) 33.0-33.9, adult: Secondary | ICD-10-CM

## 2019-07-29 DIAGNOSIS — D649 Anemia, unspecified: Secondary | ICD-10-CM | POA: Diagnosis not present

## 2019-07-29 DIAGNOSIS — E6609 Other obesity due to excess calories: Secondary | ICD-10-CM

## 2019-07-29 LAB — URINALYSIS, ROUTINE W REFLEX MICROSCOPIC
Bilirubin Urine: NEGATIVE
Hgb urine dipstick: NEGATIVE
Ketones, ur: NEGATIVE
Nitrite: NEGATIVE
RBC / HPF: NONE SEEN (ref 0–?)
Specific Gravity, Urine: 1.02 (ref 1.000–1.030)
Total Protein, Urine: NEGATIVE
Urine Glucose: NEGATIVE
Urobilinogen, UA: 4 — AB (ref 0.0–1.0)
pH: 7 (ref 5.0–8.0)

## 2019-07-29 LAB — CBC
HCT: 39.2 % (ref 39.0–52.0)
Hemoglobin: 13 g/dL (ref 13.0–17.0)
MCHC: 33.2 g/dL (ref 30.0–36.0)
MCV: 89.4 fl (ref 78.0–100.0)
Platelets: 188 10*3/uL (ref 150.0–400.0)
RBC: 4.39 Mil/uL (ref 4.22–5.81)
RDW: 14.3 % (ref 11.5–15.5)
WBC: 4.4 10*3/uL (ref 4.0–10.5)

## 2019-07-29 LAB — IBC + FERRITIN
Ferritin: 212.8 ng/mL (ref 22.0–322.0)
Iron: 66 ug/dL (ref 42–165)
Saturation Ratios: 21.2 % (ref 20.0–50.0)
Transferrin: 222 mg/dL (ref 212.0–360.0)

## 2019-07-29 LAB — HEMOGLOBIN A1C: Hgb A1c MFr Bld: 5.1 % (ref 4.6–6.5)

## 2019-07-29 NOTE — Assessment & Plan Note (Signed)
Encouraged maintaining dietary changes.  He will start exercising in the next couple of months.

## 2019-07-29 NOTE — Assessment & Plan Note (Signed)
Resolved

## 2019-07-29 NOTE — Assessment & Plan Note (Signed)
No symptoms.  Recheck UA.

## 2019-07-29 NOTE — Patient Instructions (Signed)
Nice to see you. We will check labs today. Please continue to work on dietary changes and exercise.

## 2019-07-29 NOTE — Assessment & Plan Note (Signed)
Check A1c. 

## 2019-07-29 NOTE — Assessment & Plan Note (Signed)
Previously seen by GI.  They felt this accounted for his elevated bilirubin.  We will periodically monitor.

## 2019-07-29 NOTE — Progress Notes (Signed)
[name], MD Phone: [phone]  Matthew Juarez is a 59 y.o. male who presents today for f/u.  Weight loss: Has trended down a few more pounds though today he reports he has made some significant dietary changes in an effort to lose weight.  He is eating lots of fruits and vegetables.  Eating baked meats.  Drinking mostly water.  Only occasional diet soda.  Notes his appetite is good.  No abdominal pain, blood in stool, nausea, vomiting, diarrhea, rash, itching, night sweats, depression, or anxiety.  Reports he had a colonoscopy last year.  Last PSA was acceptable.  Abnormal urinalysis: Patient denies frequency, urgency, and dysuria.  Prediabetes: No polyuria or polydipsia.  Social History   Tobacco Use  Smoking Status Former Smoker  . Years: 1.00  . Types: Cigarettes  . Quit date: 04/01/1983  . Years since quitting: 36.3  Smokeless Tobacco Never Used  Tobacco Comment   only snmoke 2- cigsper day when he smoked     ROS see history of present illness  Objective  Physical Exam Vitals:   07/29/19 1108  BP: 140/70  Pulse: 68  Temp: (!) 97.3 F (36.3 C)  SpO2: 97%    BP Readings from Last 3 Encounters:  07/29/19 140/70  07/17/19 119/73  07/07/19 100/70   Wt Readings from Last 3 Encounters:  07/29/19 281 lb 9.6 oz (127.7 kg)  07/17/19 285 lb (129.3 kg)  07/07/19 287 lb (130.2 kg)    Physical Exam Constitutional:      General: He is not in acute distress.    Appearance: He is not diaphoretic.  Cardiovascular:     Rate and Rhythm: Normal rate and regular rhythm.     Heart sounds: Normal heart sounds.  Pulmonary:     Effort: Pulmonary effort is normal.     Breath sounds: Normal breath sounds.  Lymphadenopathy:     Head:     Right side of head: No submental or submandibular adenopathy.     Left side of head: No submental or submandibular adenopathy.     Cervical: No cervical adenopathy.     Upper Body:     Right upper body: No supraclavicular or  axillary adenopathy.     Left upper body: No supraclavicular or axillary adenopathy.  Skin:    General: Skin is warm and dry.  Neurological:     Mental Status: He is alert.      Assessment/Plan: Please see individual problem list.  Vomiting Resolved.  Weight loss Slightly down though he has been making efforts to try to lose weight.  I suspect his prior significant weight loss is related to his prior illness.  He feels well overall.  We will continue to monitor.  Prior lab evaluation unremarkable for cause.  Patient will let us know if he develops any other symptoms.  Obesity Encouraged maintaining dietary changes.  He will start exercising in the next couple of months.  Prediabetes Check A1c.  Abnormal urinalysis No symptoms.  Recheck UA.  Anemia Check CBC and iron studies.  Gilbert's syndrome Previously seen by GI.  They felt this accounted for his elevated bilirubin.  We will periodically monitor.   Health Maintenance: Reports he has received both Covid vaccines.  Reports colonoscopy last year.  Will request records.  Orders Placed This Encounter  Procedures  . CBC  . IBC + Ferritin  . HgB A1c  . POCT Urinalysis Dipstick    No orders of the defined types were placed in  this encounter.   This visit occurred during the SARS-CoV-2 public health emergency.  Safety protocols were in place, including screening questions prior to the visit, additional usage of staff PPE, and extensive cleaning of exam room while observing appropriate contact time as indicated for disinfecting solutions.    [name], MD [place]

## 2019-07-29 NOTE — Addendum Note (Signed)
Addended by: Warden Fillers on: 07/29/2019 04:18 PM   Modules accepted: Orders

## 2019-07-29 NOTE — Assessment & Plan Note (Addendum)
Slightly down though he has been making efforts to try to lose weight.  I suspect his prior significant weight loss is related to his prior illness.  He feels well overall.  We will continue to monitor.  Prior lab evaluation unremarkable for cause.  Patient will let us know if he develops any other symptoms.

## 2019-07-29 NOTE — Assessment & Plan Note (Signed)
Check CBC and iron studies. 

## 2019-08-02 ENCOUNTER — Telehealth: Payer: Self-pay

## 2019-08-02 DIAGNOSIS — D649 Anemia, unspecified: Secondary | ICD-10-CM

## 2019-08-02 NOTE — Telephone Encounter (Signed)
-----   Message from Matthew Luis, MD sent at 07/31/2019  7:31 AM EDT ----- Please let the patient know his anemia is improving. He is not iron deficient. I would suggest rechecking his blood counts in 1 month. Please order a CBC for anemia unspecified.

## 2019-08-10 ENCOUNTER — Telehealth: Payer: Self-pay | Admitting: Family Medicine

## 2019-08-10 NOTE — Telephone Encounter (Signed)
Debbie with [employer] called about power chair. She states that there was a form that was not completed coreectly. [phone]

## 2019-08-10 NOTE — Telephone Encounter (Signed)
I called and LVM for debbie of [employer] to call me back to see what is incorrect on the form or I informed her on the VM to refax the form back with the incorrect information and with a note explaining what is incorrect.  Shirla Hodgkiss,cma

## 2019-08-16 DIAGNOSIS — Z79899 Other long term (current) drug therapy: Secondary | ICD-10-CM | POA: Diagnosis not present

## 2019-08-16 DIAGNOSIS — G360 Neuromyelitis optica [Devic]: Secondary | ICD-10-CM | POA: Diagnosis not present

## 2019-08-16 NOTE — Telephone Encounter (Signed)
I called and spoke to [facility] and she is faxing the form to me for the providers signature.  Deval Mroczka,cma

## 2019-08-18 NOTE — Telephone Encounter (Signed)
Fax received and placed in sign basket for provider to sign.  Adar Rase,cma

## 2019-09-02 ENCOUNTER — Ambulatory Visit (INDEPENDENT_AMBULATORY_CARE_PROVIDER_SITE_OTHER): Payer: Medicare Other | Admitting: Family Medicine

## 2019-09-02 ENCOUNTER — Ambulatory Visit
Admission: RE | Admit: 2019-09-02 | Discharge: 2019-09-02 | Disposition: A | Discharge status: Home / Self Care | Payer: Medicare Other | Attending: Family Medicine | Admitting: Family Medicine

## 2019-09-02 ENCOUNTER — Encounter: Payer: Self-pay | Admitting: Family Medicine

## 2019-09-02 ENCOUNTER — Ambulatory Visit
Admission: RE | Admit: 2019-09-02 | Discharge: 2019-09-02 | Disposition: A | Discharge status: Home / Self Care | Payer: Medicare Other | Source: Ambulatory Visit | Attending: Family Medicine | Admitting: Family Medicine

## 2019-09-02 ENCOUNTER — Other Ambulatory Visit (INDEPENDENT_AMBULATORY_CARE_PROVIDER_SITE_OTHER): Payer: Medicare Other

## 2019-09-02 ENCOUNTER — Other Ambulatory Visit: Payer: Self-pay

## 2019-09-02 VITALS — BP 150/80 | HR 74 | Temp 96.5°F | Ht 77.0 in | Wt 287.0 lb

## 2019-09-02 DIAGNOSIS — D649 Anemia, unspecified: Secondary | ICD-10-CM | POA: Diagnosis not present

## 2019-09-02 DIAGNOSIS — R05 Cough: Secondary | ICD-10-CM

## 2019-09-02 DIAGNOSIS — R059 Cough, unspecified: Secondary | ICD-10-CM

## 2019-09-02 LAB — CBC
HCT: 40.6 % (ref 39.0–52.0)
Hemoglobin: 13.7 g/dL (ref 13.0–17.0)
MCHC: 33.8 g/dL (ref 30.0–36.0)
MCV: 87.8 fl (ref 78.0–100.0)
Platelets: 181 10*3/uL (ref 150.0–400.0)
RBC: 4.63 Mil/uL (ref 4.22–5.81)
RDW: 13.1 % (ref 11.5–15.5)
WBC: 3.6 10*3/uL — ABNORMAL LOW (ref 4.0–10.5)

## 2019-09-02 NOTE — Progress Notes (Signed)
  [name], MD Phone: [phone]  Matthew Juarez is a 59 y.o. male who presents today for same-day visit.  Cough: Patient notes this has been going on since the end of April. He had Covid back in January and notes his cough from that pretty much resolved in March. He notes he is occasionally coughing up some yellow mucus. He has had no congestion, fevers, shortness of breath, postnasal drip, Covid exposures, sneezing, reflux, rhinorrhea, or wheezing. He has been fully vaccinated against Covid.  Social History   Tobacco Use  Smoking Status Former Smoker  . Years: 1.00  . Types: Cigarettes  . Quit date: 04/01/1983  . Years since quitting: 36.4  Smokeless Tobacco Never Used  Tobacco Comment   only snmoke 2- cigsper day when he smoked     ROS see history of present illness  Objective  Physical Exam Vitals:   09/02/19 1009  BP: (!) 150/80  Pulse: 74  Temp: (!) 96.5 F (35.8 C)  SpO2: 97%    BP Readings from Last 3 Encounters:  09/02/19 (!) 150/80  07/29/19 140/70  07/17/19 119/73   Wt Readings from Last 3 Encounters:  09/02/19 287 lb (130.2 kg)  07/29/19 281 lb 9.6 oz (127.7 kg)  07/17/19 285 lb (129.3 kg)    Physical Exam Constitutional:      General: He is not in acute distress.    Appearance: He is not diaphoretic.  Cardiovascular:     Rate and Rhythm: Normal rate and regular rhythm.     Heart sounds: Normal heart sounds.  Pulmonary:     Effort: Pulmonary effort is normal.     Breath sounds: Normal breath sounds.  Skin:    General: Skin is warm and dry.  Neurological:     Mental Status: He is alert.      Assessment/Plan: Please see individual problem list.  Cough This could represent a post Covid long-haul symptom though he did improve initially. His lungs are clear though given persistent cough and will obtain a chest x-ray. We'll then determine the next step in management.    Orders Placed This Encounter  Procedures  . DG Chest 2 View    Standing Status:   Future    Standing Expiration Date:   09/01/2020    Order Specific Question:   Reason for Exam (SYMPTOM  OR DIAGNOSIS REQUIRED)    Answer:   cough with mucus for >1 month    Order Specific Question:   Preferred imaging location?    Answer:   [facility]    Order Specific Question:   Radiology Contrast Protocol - do NOT remove file path    Answer:   [email or web address]    No orders of the defined types were placed in this encounter.   This visit occurred during the SARS-CoV-2 public health emergency.  Safety protocols were in place, including screening questions prior to the visit, additional usage of staff PPE, and extensive cleaning of exam room while observing appropriate contact time as indicated for disinfecting solutions.    [name], MD [facility]

## 2019-09-02 NOTE — Patient Instructions (Signed)
Please go get the CXR.

## 2019-09-02 NOTE — Assessment & Plan Note (Signed)
This could represent a post Covid long-haul symptom though he did improve initially. His lungs are clear though given persistent cough and will obtain a chest x-ray. We'll then determine the next step in management.

## 2019-09-06 ENCOUNTER — Other Ambulatory Visit: Payer: Self-pay | Admitting: Family Medicine

## 2019-09-06 DIAGNOSIS — D72819 Decreased white blood cell count, unspecified: Secondary | ICD-10-CM

## 2019-09-07 ENCOUNTER — Ambulatory Visit: Payer: Medicare Other | Admitting: Family Medicine

## 2019-09-07 ENCOUNTER — Other Ambulatory Visit: Payer: Self-pay | Admitting: Family Medicine

## 2019-09-07 DIAGNOSIS — R053 Chronic cough: Secondary | ICD-10-CM

## 2019-09-20 ENCOUNTER — Other Ambulatory Visit: Payer: Self-pay

## 2019-09-20 ENCOUNTER — Other Ambulatory Visit (INDEPENDENT_AMBULATORY_CARE_PROVIDER_SITE_OTHER): Payer: Medicare Other

## 2019-09-20 DIAGNOSIS — D72819 Decreased white blood cell count, unspecified: Secondary | ICD-10-CM

## 2019-09-21 ENCOUNTER — Telehealth: Payer: Self-pay | Admitting: Family Medicine

## 2019-09-21 ENCOUNTER — Ambulatory Visit (INDEPENDENT_AMBULATORY_CARE_PROVIDER_SITE_OTHER): Payer: Medicare Other

## 2019-09-21 VITALS — Ht 77.0 in | Wt 287.0 lb

## 2019-09-21 DIAGNOSIS — Z0000 Encounter for general adult medical examination without abnormal findings: Secondary | ICD-10-CM | POA: Diagnosis not present

## 2019-09-21 LAB — CBC WITH DIFFERENTIAL/PLATELET
Basophils Absolute: 0 10*3/uL (ref 0.0–0.1)
Basophils Relative: 0.6 % (ref 0.0–3.0)
Eosinophils Absolute: 0.3 10*3/uL (ref 0.0–0.7)
Eosinophils Relative: 6.5 % — ABNORMAL HIGH (ref 0.0–5.0)
HCT: 42.6 % (ref 39.0–52.0)
Hemoglobin: 14.3 g/dL (ref 13.0–17.0)
Lymphocytes Relative: 22.2 % (ref 12.0–46.0)
Lymphs Abs: 1.1 10*3/uL (ref 0.7–4.0)
MCHC: 33.5 g/dL (ref 30.0–36.0)
MCV: 87.8 fl (ref 78.0–100.0)
Monocytes Absolute: 0.4 10*3/uL (ref 0.1–1.0)
Monocytes Relative: 7.6 % (ref 3.0–12.0)
Neutro Abs: 3 10*3/uL (ref 1.4–7.7)
Neutrophils Relative %: 63.1 % (ref 43.0–77.0)
Platelets: 178 10*3/uL (ref 150.0–400.0)
RBC: 4.85 Mil/uL (ref 4.22–5.81)
RDW: 13.2 % (ref 11.5–15.5)
WBC: 4.8 10*3/uL (ref 4.0–10.5)

## 2019-09-21 NOTE — Telephone Encounter (Signed)
Zenia Resides with [employer] called about form that needs to be signed [phone]

## 2019-09-21 NOTE — Progress Notes (Signed)
Subjective:   Matthew Juarez is a 59 y.o. male who presents for Medicare Annual/Subsequent preventive examination.  Review of Systems    No ROS.  Medicare Wellness Virtual Visit.     Cardiac Risk Factors include: advanced age (>92men, >63 women);hypertension;male gender     Objective:    Today's Vitals   09/21/19 0902  Weight: 287 lb (130.2 kg)  Height: 6\' 5"  (1.956 m)   Body mass index is 34.03 kg/m.  Advanced Directives 09/21/2019 07/17/2019 04/28/2019 09/20/2018 09/09/2017 08/21/2016 12/22/2014  Does Patient Have a Medical Advance Directive? No No No No No No No  Would patient like information on creating a medical advance directive? No - Patient declined - No - Patient declined No - Patient declined Yes (MAU/Ambulatory/Procedural Areas - Information given) Yes (MAU/Ambulatory/Procedural Areas - Information given) -  Pre-existing out of facility DNR order (yellow form or pink MOST form) - - - - - - -    Current Medications (verified) Outpatient Encounter Medications as of 09/21/2019  Medication Sig  . acetaminophen (TYLENOL) 500 MG tablet Take 500 mg by mouth every 6 (six) hours as needed for mild pain.  09/23/2019 albuterol (VENTOLIN HFA) 108 (90 Base) MCG/ACT inhaler Inhale 1-2 puffs into the lungs every 4 (four) hours as needed for wheezing or shortness of breath.  . allopurinol (ZYLOPRIM) 300 MG tablet TAKE 1 TABLET BY MOUTH EVERY DAY  . atorvastatin (LIPITOR) 40 MG tablet TAKE 1 TABLET BY MOUTH EVERY DAY  . benzonatate (TESSALON PERLES) 100 MG capsule Take 1 capsule (100 mg total) by mouth 3 (three) times daily as needed for cough.  . cetirizine (ZYRTEC) 10 MG tablet Take 1 tablet (10 mg total) by mouth daily.  . Cholecalciferol (VITAMIN D) 2000 UNITS CAPS Take 2,000 Units by mouth daily.  . Clobetasol Propionate 0.05 % lotion APPLY TO AFFECTED AREA TWICE A DAY AS NEEDED  . Coenzyme Q10 (CO Q-10) 200 MG CAPS Take 1 capsule by mouth daily.  Marland Kitchen desonide (DESOWEN) 0.05 % cream APPLY TO  AFECTED AREA TWICE A DAY AS NEEDED  . esomeprazole (NEXIUM) 20 MG capsule Take 1 capsule (20 mg total) by mouth daily before breakfast.  . Fluocinolone Acetonide Scalp 0.01 % OIL APPLY TO SCALP AT BEDTIME, WASH OUT WHEN WAKE UP  . gabapentin (NEURONTIN) 600 MG tablet Take 600 mg by mouth 2 (two) times daily.  Marland Kitchen HYDROcodone-Chlorpheniramine 5-4 MG/5ML SOLN Take 5 mLs by mouth every 6 (six) hours as needed.  Marland Kitchen HYDROcodone-homatropine (HYCODAN) 5-1.5 MG/5ML syrup Take 5 mLs by mouth every 8 (eight) hours as needed for cough.  Marland Kitchen ketoconazole (NIZORAL) 2 % cream Apply 1 application topically daily.  . ondansetron (ZOFRAN) 4 MG tablet Take 1 tablet (4 mg total) by mouth every 8 (eight) hours as needed for nausea or vomiting.  . Oxycodone HCl 10 MG TABS Take by mouth.  . predniSONE (DELTASONE) 10 MG tablet Take 6 tabs (60 mg) PO x 3 days, then take 4 tabs (40 mg) PO x 3 days, then take 2 tabs (20 mg) PO x 3 days, then take 1 tab (10 mg) PO x 3 days, then take 1/2 tab (5 mg) PO x 4 days.  Marland Kitchen terbinafine (LAMISIL) 250 MG tablet TAKE 1 TABLET BY MOUTH EVERY DAY  . XARELTO 20 MG TABS tablet TAKE 1 TABLET (20 MG TOTAL) BY MOUTH DAILY WITH SUPPER.   No facility-administered encounter medications on file as of 09/21/2019.    Allergies (verified) Patient has no  known allergies.   History: Past Medical History:  Diagnosis Date  . Arthritis   . Cardiac arrest ([place]) 06/23/2012  . Chronic back pain   . Gout   . Hammer toe   . Headache(784.0)   . History of blood clots   . Hypertension   . Pulmonary embolism ([place])   . Pulmonary infarction ([place]) 07/23/2012   Overview:  Last Assessment & Plan:  With associated R heart failure and cardiac arrest.  - you could make an argument to treat him with lifelong coumadin given the severity of his PE, although the literature supports 12 months.  - he will be treated (at least) 12 months, to be followed by Dr Karlton Lemon.  - will need hypercoag w/u a month after the coumadin  is stopped - restart coumadin if at any incr  . Transverse myelitis [facility])    Past Surgical History:  Procedure Laterality Date  . HAMMER TOE SURGERY     2016   Family History  Problem Relation Age of Onset  . Dementia Mother   . Lung cancer Maternal Uncle   . Colon cancer Maternal Aunt   . Lung cancer Maternal Aunt   . Heart disease Neg Hx    Social History   Socioeconomic History  . Marital status: Married    Spouse name: Not on file  . Number of children: Not on file  . Years of education: Not on file  . Highest education level: Not on file  Occupational History  . Occupation: disability  Tobacco Use  . Smoking status: Former Smoker    Years: 1.00    Types: Cigarettes    Quit date: 04/01/1983    Years since quitting: 36.4  . Smokeless tobacco: Never Used  . Tobacco comment: only snmoke 2- cigsper day when he smoked  Vaping Use  . Vaping Use: Never used  Substance and Sexual Activity  . Alcohol use: No    Alcohol/week: 0.0 standard drinks  . Drug use: No  . Sexual activity: Not Currently  Other Topics Concern  . Not on file  Social History Narrative  . Not on file   Social Determinants of Health   Financial Resource Strain:   . Difficulty of Paying Living Expenses:   Food Insecurity:   . Worried About Charity fundraiser in the Last Year:   . Arboriculturist in the Last Year:   Transportation Needs:   . Film/video editor (Medical):   Marland Kitchen Lack of Transportation (Non-Medical):   Physical Activity: Sufficiently Active  . Days of Exercise per Week: 5 days  . Minutes of Exercise per Session: 30 min  Stress:   . Feeling of Stress :   Social Connections:   . Frequency of Communication with Friends and Family:   . Frequency of Social Gatherings with Friends and Family:   . Attends Religious Services:   . Active Member of Clubs or Organizations:   . Attends Archivist Meetings:   Marland Kitchen Marital Status:     Tobacco Counseling Counseling given: Not  Answered Comment: only snmoke 2- cigsper day when he smoked   Clinical Intake:  Pre-visit preparation completed: Yes        Diabetes: No  How often do you need to have someone help you when you read instructions, pamphlets, or other written materials from your doctor or pharmacy?: 1 - Never Interpreter Needed?: No      Activities of Daily Living In your present state  of health, do you have any difficulty performing the following activities: 09/21/2019  Hearing? N  Vision? N  Difficulty concentrating or making decisions? N  Walking or climbing stairs? Y  Dressing or bathing? N  Doing errands, shopping? Y  Preparing Food and eating ? Y  Comment Wife meal preps. Self feeds.  Using the Toilet? N  In the past six months, have you accidently leaked urine? N  Do you have problems with loss of bowel control? N  Managing your Medications? N  Managing your Finances? Y  Housekeeping or managing your Housekeeping? N  Some recent data might be hidden    Patient Care Team: Glori Luis, MD as PCP - General (Family Medicine)  Indicate any recent Medical Services you may have received from other than [facility] providers in the past year (date may be approximate).     Assessment:   This is a routine wellness examination for Rodrigues.  I connected with Rande  today by telephone and verified that I am speaking with the correct person using two identifiers. Location patient: home Location provider: work Persons participating in the virtual visit: patient, Engineer, civil (consulting).    I discussed the limitations, risks, security and privacy concerns of performing an evaluation and management service by telephone and the availability of in person appointments. The patient expressed understanding and verbally consented to this telephonic visit.    Interactive audio and video telecommunications were attempted between this provider and patient, however failed, due to patient having technical difficulties  OR patient did not have access to video capability.  We continued and completed visit with audio only.  Some vital signs may be absent or patient reported.   Hearing/Vision screen  Hearing Screening   125Hz  250Hz  500Hz  1000Hz  2000Hz  3000Hz  4000Hz  6000Hz  8000Hz   Right ear:           Left ear:           Comments: Patient is able to hear conversational tones without difficulty.  No issues reported.  Vision Screening Comments: Recommended annual ophthalmology exams for early detection of glaucoma and other disorders of the eye. Wears reader lenses Visual acuity not assessed, virtual visit.  They have seen their ophthalmologist in the last 12 months. Followed by Dr. .   Dietary issues and exercise activities discussed: Healthy/low carb/low cholesterol diet Good water intake Current Exercise Habits: The patient does not participate in regular exercise at present  Goals      Patient Stated   .  Weight (lb) < 250 lb (113.4 kg) (pt-stated)      Stay active with daily chair exercises      Depression Screen PHQ 2/9 Scores 09/21/2019 07/07/2019 04/19/2019 12/22/2018 10/29/2018 09/20/2018 09/09/2017  PHQ - 2 Score 0 0 0 0 0 0 0  PHQ- 9 Score - - - - - - -    Fall Risk Fall Risk  09/21/2019 07/07/2019 04/19/2019 12/22/2018 10/29/2018  Falls in the past year? 0 0 0 0 0  Number falls in past yr: 0 0 0 0 0  Follow up Falls evaluation completed Falls evaluation completed Falls evaluation completed Falls evaluation completed -    Handrails in use when using stairs? Yes  Home free of loose throw rugs in walkways, pet beds, electrical cords, etc? Yes  Adequate lighting in your home to reduce risk of falls? Yes   ASSISTIVE DEVICES UTILIZED TO PREVENT FALLS:  Life alert? No  Use of a cane, walker or w/c? Yes  Grab bars  in the bathroom? Yes  Shower chair or bench in shower? Yes  Elevated toilet seat or a handicapped toilet? Yes   TIMED UP AND GO:  Was the test performed? No . Virtual  visit  Cognitive Function: MMSE - Mini Mental State Exam 09/09/2017 08/21/2016  Orientation to time 5 5  Orientation to Place 5 5  Registration 3 3  Attention/ Calculation 5 5  Recall 3 3  Language- name 2 objects 2 2  Language- repeat 1 1  Language- follow 3 step command 3 3  Language- read & follow direction 1 1  Write a sentence 1 1  Copy design 1 1  Total score 30 30     6CIT Screen 09/21/2019 09/20/2018  What Year? 0 points 0 points  What month? 0 points 0 points  What time? - 0 points  Count back from 20 - 0 points  Months in reverse 0 points 0 points  Repeat phrase 0 points -    Immunizations Immunization History  Administered Date(s) Administered  . Hepatitis A, Adult 11/12/2017, 11/09/2018  . Influenza Split 01/30/2012  . Influenza,inj,Quad PF,6+ Mos 12/29/2013, 01/16/2016, 04/27/2017, 02/09/2018, 12/22/2018  . Influenza-Unspecified 02/19/2012, 01/01/2014, 01/16/2016, 04/27/2017, 02/09/2018  . PFIZER SARS-COV-2 Vaccination 06/25/2019, 07/16/2019  . PPD Test 12/05/2011    Health Maintenance There are no preventive care reminders to display for this patient. Health Maintenance  Topic Date Due  . INFLUENZA VACCINE  10/30/2019  . COLONOSCOPY  01/30/2020  . TETANUS/TDAP  04/01/2021  . COVID-19 Vaccine  Completed  . Hepatitis C Screening  Completed  . HIV Screening  Completed   Lung Cancer Screening: (Low Dose CT Chest recommended if Age 61-80 years, 30 pack-year currently smoking OR have quit w/in 15years.) does not qualify.   Dental Screening: Recommended annual dental exams for proper oral hygiene  Community Resource Referral / Chronic Care Management: CRR required this visit?  No  CCM required this visit?  No     Plan:    Keep all routine maintenance appointments.   Follow up  11/28/19 @ 10:30  I have personally reviewed and noted the following in the patient's chart:   . Medical and social history . Use of alcohol, tobacco or illicit drugs   . Current medications and supplements . Functional ability and status . Nutritional status . Physical activity . Advanced directives . List of other physicians . Hospitalizations, surgeries, and ER visits in previous 12 months . Vitals . Screenings to include cognitive, depression, and falls . Referrals and appointments  In addition, I have reviewed and discussed with patient certain preventive protocols, quality metrics, and best practice recommendations. A written personalized care plan for preventive services as well as general preventive health recommendations were provided to patient via mychart.     Ashok Pall, LPN   [id number]/09/6224

## 2019-09-21 NOTE — Progress Notes (Signed)
I have reviewed the above note and agree.  Tereka Thorley, M.D.  

## 2019-09-21 NOTE — Patient Instructions (Addendum)
Matthew Juarez , Thank you for taking time to come for your Medicare Wellness Visit. I appreciate your ongoing commitment to your health goals. Please review the following plan we discussed and let me know if I can assist you in the future.   These are the goals we discussed: Goals      Patient Stated   .  Weight (lb) < 250 lb (113.4 kg) (pt-stated)      Stay active with daily chair exercises       This is a list of the screening recommended for you and due dates:  Health Maintenance  Topic Date Due  . Flu Shot  10/30/2019  . Colon Cancer Screening  01/30/2020  . Tetanus Vaccine  04/01/2021  . COVID-19 Vaccine  Completed  .  Hepatitis C: One time screening is recommended by Center for Disease Control  (CDC) for  adults born from [birth year] through 1967.   Completed  . HIV Screening  Completed    Immunizations Immunization History  Administered Date(s) Administered  . Hepatitis A, Adult 11/12/2017, 11/09/2018  . Influenza Split 01/30/2012  . Influenza,inj,Quad PF,6+ Mos 12/29/2013, 01/16/2016, 04/27/2017, 02/09/2018, 12/22/2018  . Influenza-Unspecified 02/19/2012, 01/01/2014, 01/16/2016, 04/27/2017, 02/09/2018  . PFIZER SARS-COV-2 Vaccination 06/25/2019, 07/16/2019  . PPD Test 12/05/2011   Keep all routine maintenance appointments.   Follow up  11/28/19 @ 10:30  Advanced directives: declined  Conditions/risks identified: none new  Follow up in one year for your annual wellness visit   Preventive Care 40-64 Years, Male Preventive care refers to lifestyle choices and visits with your health care provider that can promote health and wellness. What does preventive care include?  A yearly physical exam. This is also called an annual well check.  Dental exams once or twice a year.  Routine eye exams. Ask your health care provider how often you should have your eyes checked.  Personal lifestyle choices, including:  Daily care of your teeth and gums.  Regular physical  activity.  Eating a healthy diet.  Avoiding tobacco and drug use.  Limiting alcohol use.  Practicing safe sex.  Taking low-dose aspirin every day starting at age 33. What happens during an annual well check? The services and screenings done by your health care provider during your annual well check will depend on your age, overall health, lifestyle risk factors, and family history of disease. Counseling  Your health care provider may ask you questions about your:  Alcohol use.  Tobacco use.  Drug use.  Emotional well-being.  Home and relationship well-being.  Sexual activity.  Eating habits.  Work and work Statistician. Screening  You may have the following tests or measurements:  Height, weight, and BMI.  Blood pressure.  Lipid and cholesterol levels. These may be checked every 5 years, or more frequently if you are over 103 years old.  Skin check.  Lung cancer screening. You may have this screening every year starting at age 24 if you have a 30-pack-year history of smoking and currently smoke or have quit within the past 15 years.  Fecal occult blood test (FOBT) of the stool. You may have this test every year starting at age 66.  Flexible sigmoidoscopy or colonoscopy. You may have a sigmoidoscopy every 5 years or a colonoscopy every 10 years starting at age 39.  Prostate cancer screening. Recommendations will vary depending on your family history and other risks.  Hepatitis C blood test.  Hepatitis B blood test.  Sexually transmitted disease (STD) testing.  Diabetes  screening. This is done by checking your blood sugar (glucose) after you have not eaten for a while (fasting). You may have this done every 1-3 years. Discuss your test results, treatment options, and if necessary, the need for more tests with your health care provider. Vaccines  Your health care provider may recommend certain vaccines, such as:  Influenza vaccine. This is recommended every  year.  Tetanus, diphtheria, and acellular pertussis (Tdap, Td) vaccine. You may need a Td booster every 10 years.  Zoster vaccine. You may need this after age 72.  Pneumococcal 13-valent conjugate (PCV13) vaccine. You may need this if you have certain conditions and have not been vaccinated.  Pneumococcal polysaccharide (PPSV23) vaccine. You may need one or two doses if you smoke cigarettes or if you have certain conditions. Talk to your health care provider about which screenings and vaccines you need and how often you need them. This information is not intended to replace advice given to you by your health care provider. Make sure you discuss any questions you have with your health care provider. Document Released: 04/13/2015 Document Revised: 12/05/2015 Document Reviewed: 01/16/2015 Elsevier Interactive Patient Education  2017 [employer].  Fall Prevention in the Home Falls can cause injuries. They can happen to people of all ages. There are many things you can do to make your home safe and to help prevent falls. What can I do on the outside of my home?  Regularly fix the edges of walkways and driveways and fix any cracks.  Remove anything that might make you trip as you walk through a door, such as a raised step or threshold.  Trim any bushes or trees on the path to your home.  Use bright outdoor lighting.  Clear any walking paths of anything that might make someone trip, such as rocks or tools.  Regularly check to see if handrails are loose or broken. Make sure that both sides of any steps have handrails.  Any raised decks and porches should have guardrails on the edges.  Have any leaves, snow, or ice cleared regularly.  Use sand or salt on walking paths during winter.  Clean up any spills in your garage right away. This includes oil or grease spills. What can I do in the bathroom?  Use night lights.  Install grab bars by the toilet and in the tub and shower. Do not  use towel bars as grab bars.  Use non-skid mats or decals in the tub or shower.  If you need to sit down in the shower, use a plastic, non-slip stool.  Keep the floor dry. Clean up any water that spills on the floor as soon as it happens.  Remove soap buildup in the tub or shower regularly.  Attach bath mats securely with double-sided non-slip rug tape.  Do not have throw rugs and other things on the floor that can make you trip. What can I do in the bedroom?  Use night lights.  Make sure that you have a light by your bed that is easy to reach.  Do not use any sheets or blankets that are too big for your bed. They should not hang down onto the floor.  Have a firm chair that has side arms. You can use this for support while you get dressed.  Do not have throw rugs and other things on the floor that can make you trip. What can I do in the kitchen?  Clean up any spills right away.  Avoid  walking on wet floors.  Keep items that you use a lot in easy-to-reach places.  If you need to reach something above you, use a strong step stool that has a grab bar.  Keep electrical cords out of the way.  Do not use floor polish or wax that makes floors slippery. If you must use wax, use non-skid floor wax.  Do not have throw rugs and other things on the floor that can make you trip. What can I do with my stairs?  Do not leave any items on the stairs.  Make sure that there are handrails on both sides of the stairs and use them. Fix handrails that are broken or loose. Make sure that handrails are as long as the stairways.  Check any carpeting to make sure that it is firmly attached to the stairs. Fix any carpet that is loose or worn.  Avoid having throw rugs at the top or bottom of the stairs. If you do have throw rugs, attach them to the floor with carpet tape.  Make sure that you have a light switch at the top of the stairs and the bottom of the stairs. If you do not have them, ask  someone to add them for you. What else can I do to help prevent falls?  Wear shoes that:  Do not have high heels.  Have rubber bottoms.  Are comfortable and fit you well.  Are closed at the toe. Do not wear sandals.  If you use a stepladder:  Make sure that it is fully opened. Do not climb a closed stepladder.  Make sure that both sides of the stepladder are locked into place.  Ask someone to hold it for you, if possible.  Clearly mark and make sure that you can see:  Any grab bars or handrails.  First and last steps.  Where the edge of each step is.  Use tools that help you move around (mobility aids) if they are needed. These include:  Canes.  Walkers.  Scooters.  Crutches.  Turn on the lights when you go into a dark area. Replace any light bulbs as soon as they burn out.  Set up your furniture so you have a clear path. Avoid moving your furniture around.  If any of your floors are uneven, fix them.  If there are any pets around you, be aware of where they are.  Review your medicines with your doctor. Some medicines can make you feel dizzy. This can increase your chance of falling. Ask your doctor what other things that you can do to help prevent falls. This information is not intended to replace advice given to you by your health care provider. Make sure you discuss any questions you have with your health care provider. Document Released: 01/11/2009 Document Revised: 08/23/2015 Document Reviewed: 04/21/2014 Elsevier Interactive Patient Education  2017 [employer].

## 2019-09-21 NOTE — Telephone Encounter (Signed)
I called Matthew Juarez at [phone] and LVM for her to call me back. Eliel Dudding,cma

## 2019-09-22 NOTE — Telephone Encounter (Signed)
Spoke with Matthew Juarez and will get the provider to sign the form needed to be signed for a power wheelchair.  Ahaana Rochette,cma

## 2019-09-29 DIAGNOSIS — G360 Neuromyelitis optica [Devic]: Secondary | ICD-10-CM | POA: Diagnosis not present

## 2019-09-29 DIAGNOSIS — G9589 Other specified diseases of spinal cord: Secondary | ICD-10-CM | POA: Diagnosis not present

## 2019-10-12 DIAGNOSIS — G373 Acute transverse myelitis in demyelinating disease of central nervous system: Secondary | ICD-10-CM | POA: Diagnosis not present

## 2019-10-12 DIAGNOSIS — M19012 Primary osteoarthritis, left shoulder: Secondary | ICD-10-CM | POA: Diagnosis not present

## 2019-10-12 DIAGNOSIS — G8929 Other chronic pain: Secondary | ICD-10-CM | POA: Diagnosis not present

## 2019-10-12 DIAGNOSIS — Z79891 Long term (current) use of opiate analgesic: Secondary | ICD-10-CM | POA: Diagnosis not present

## 2019-10-12 DIAGNOSIS — G894 Chronic pain syndrome: Secondary | ICD-10-CM | POA: Diagnosis not present

## 2019-10-12 DIAGNOSIS — M25512 Pain in left shoulder: Secondary | ICD-10-CM | POA: Diagnosis not present

## 2019-10-14 DIAGNOSIS — H35033 Hypertensive retinopathy, bilateral: Secondary | ICD-10-CM | POA: Diagnosis not present

## 2019-10-14 DIAGNOSIS — H35362 Drusen (degenerative) of macula, left eye: Secondary | ICD-10-CM | POA: Diagnosis not present

## 2019-10-14 DIAGNOSIS — H2513 Age-related nuclear cataract, bilateral: Secondary | ICD-10-CM | POA: Diagnosis not present

## 2019-10-14 DIAGNOSIS — H25013 Cortical age-related cataract, bilateral: Secondary | ICD-10-CM | POA: Diagnosis not present

## 2019-10-31 DIAGNOSIS — Z79899 Other long term (current) drug therapy: Secondary | ICD-10-CM | POA: Diagnosis not present

## 2019-10-31 DIAGNOSIS — M19012 Primary osteoarthritis, left shoulder: Secondary | ICD-10-CM | POA: Diagnosis not present

## 2019-10-31 DIAGNOSIS — G8929 Other chronic pain: Secondary | ICD-10-CM | POA: Diagnosis not present

## 2019-10-31 DIAGNOSIS — M25512 Pain in left shoulder: Secondary | ICD-10-CM | POA: Diagnosis not present

## 2019-11-11 ENCOUNTER — Ambulatory Visit (INDEPENDENT_AMBULATORY_CARE_PROVIDER_SITE_OTHER): Payer: Medicare Other | Admitting: Pulmonary Disease

## 2019-11-11 ENCOUNTER — Encounter: Payer: Self-pay | Admitting: Pulmonary Disease

## 2019-11-11 ENCOUNTER — Other Ambulatory Visit: Payer: Self-pay

## 2019-11-11 VITALS — BP 146/84 | HR 65 | Temp 96.9°F | Ht 77.0 in | Wt 290.0 lb

## 2019-11-11 DIAGNOSIS — R059 Cough, unspecified: Secondary | ICD-10-CM

## 2019-11-11 DIAGNOSIS — Z8616 Personal history of COVID-19: Secondary | ICD-10-CM

## 2019-11-11 DIAGNOSIS — U071 COVID-19: Secondary | ICD-10-CM

## 2019-11-11 DIAGNOSIS — J1282 Pneumonia due to coronavirus disease 2019: Secondary | ICD-10-CM

## 2019-11-11 DIAGNOSIS — R05 Cough: Secondary | ICD-10-CM | POA: Diagnosis not present

## 2019-11-11 MED ORDER — ALBUTEROL SULFATE HFA 108 (90 BASE) MCG/ACT IN AERS: 2.0000 | INHALATION_SPRAY | Freq: Four times a day (QID) | RESPIRATORY_TRACT | 5 refills | Status: DC | PRN

## 2019-11-11 MED ORDER — FLOVENT HFA 110 MCG/ACT IN AERO
2.0000 | INHALATION_SPRAY | Freq: Two times a day (BID) | RESPIRATORY_TRACT | 12 refills | Status: DC
Start: 2019-11-11 — End: 2021-09-27

## 2019-11-11 NOTE — Progress Notes (Signed)
[place] [facility]  Chief Complaint  Patient presents with  . pulmonary consult    per Dr. Birdie Sons-- non prod cough since being dx with covid 04/2019.    Constitutional:  BP (!) 146/84 (BP Location: Left Arm, Cuff Size: Normal)   Pulse 65   Temp (!) 96.9 F (36.1 C) (Temporal)   Ht 6\' 5"  (1.956 m)   Wt 290 lb (131.5 kg)   SpO2 100%   BMI 34.39 kg/m   Past Medical History:  PEA 2nd to PE with Rt leg DVT 2014, C diff, Transverse myelitis, NSVT, Chronic pain, Gout, HTN, Neuromyelitis optica, Influenza B February 2018, ANA positive, COVID 19 pneumonia January 2021  Summary:  Matthew Juarez is a 59 y.o. male with cough that developed after COVID 19 pneumonia infection in January 2021.  Subjective:   He developed COVID 19 infection in January.  Was treated with monoclonal antibody as outpatient.  He didn't require hospitalization.  Since then he has a persistent cough.  This can happen day or night.  He will bring up clear sputum.  Not having wheezing ,chest pain, or fever.  No skin rash, joint swelling, or GI symptoms.  Takes OTC antihistamine for allergies.  No prior history of asthma.  Denies food allergies.  No animal/bird exposures.  He is not currently working.  He smoked cigarettes for about 6 months when he was younger.  CXR from 04/28/19 showed bilateral infiltrates.  CXR from 09/02/19 showed clear lungs.  CBC from 09/20/19 showed eosinophil of 0.3 K/uL  Physical Exam:   Appearance - well kempt, sitting in wheelchair  ENMT - no sinus tenderness, no nasal discharge, no oral exudate, Mallampati 2  Respiratory - no wheeze, or rales  CV - regular rate and rhythm, no murmurs  GI - soft, non tender  Lymph - no adenopathy noted in neck  Ext - no edema  Skin - no rashes  Neuro - alert and oriented x 3  Psych - normal mood and affect  Discussion:  He had COVID 19 pneumonia in January 2021.  He has developed a persistent cough since  then.  His symptoms are more suggestive of asthma.  He did have relative elevation in eosinophils from CBC in June 2021.  He also has history of allergic rhinitis.  I am less concerned about post-COVID pulmonary fibrosis given his clear chest xray from June 2021.  Assessment/Plan:   Chronic cough likely from asthma. - will give trial of flovent 110 mcg bid  - prn ventolin - will arrange for PFT - if cough doesn't improve or if he has diffusion defect on PFT, then will need to arrange for HRCT  History of DVT with PE in 2014. - he is on lifelong anticoagulation per PCP   Neuromyelitis optica. - followed by Dr. 2015 with [facility] neurology - maintained on rituximab  Chronic pain after transverse myelitis. - followed by Dr. [facility] with [facility] pain management  A total of  47 minutes spent addressing patient care issues on day of visit.  Follow up:  Patient Instructions  Flovent two puffs in the morning and two puffs in the evening; rinse your mouth after each use  Ventolin two puffs every 6 hours as needed for cough, wheezing, chest congestion, or shortness of breath  Will schedule pulmonary function test  Follow up in 6 to 8 weeks with Dr. [facility] or Nurse Practitioner   Signature:  Craige Cotta, MD  [place] [facility] Pager: [phone] 11/11/2019, 10:51 AM  Flow Sheet     Pulmonary tests:    Chest imaging:   PPD 12/08/11 >> negative  CT chest 01/08/15 >> coronary atherosclerosis, fatty liver  Cardiac tests:   Doppler leg 04/22/12 >> Rt popliteal vein DVT  Echo 06/20/16 >> EF 60 to 65%, grade 1 DD  Medications:   Allergies as of 11/11/2019   No Known Allergies     Medication List       Accurate as of November 11, 2019 10:51 AM. If you have any questions, ask your nurse or doctor.        STOP taking these medications   benzonatate 100 MG capsule Commonly known as: Lawyer Stopped by: Coralyn Helling, MD     cetirizine 10 MG tablet Commonly known as: ZYRTEC Stopped by: Coralyn Helling, MD   HYDROcodone-Chlorpheniramine 5-4 MG/5ML Soln Stopped by: Coralyn Helling, MD   predniSONE 10 MG tablet Commonly known as: DELTASONE Stopped by: Coralyn Helling, MD   terbinafine 250 MG tablet Commonly known as: LAMISIL Stopped by: Coralyn Helling, MD     TAKE these medications   acetaminophen 500 MG tablet Commonly known as: TYLENOL Take 500 mg by mouth every 6 (six) hours as needed for mild pain.   albuterol 108 (90 Base) MCG/ACT inhaler Commonly known as: VENTOLIN HFA Inhale 2 puffs into the lungs every 6 (six) hours as needed for wheezing or shortness of breath. What changed:   how much to take  when to take this Changed by: Coralyn Helling, MD   allopurinol 300 MG tablet Commonly known as: ZYLOPRIM TAKE 1 TABLET BY MOUTH EVERY DAY   atorvastatin 40 MG tablet Commonly known as: LIPITOR TAKE 1 TABLET BY MOUTH EVERY DAY   Clobetasol Propionate 0.05 % lotion APPLY TO AFFECTED AREA TWICE A DAY AS NEEDED   Co Q-10 200 MG Caps Take 1 capsule by mouth daily.   desonide 0.05 % cream Commonly known as: DESOWEN APPLY TO AFECTED AREA TWICE A DAY AS NEEDED   esomeprazole 20 MG capsule Commonly known as: NEXIUM Take 1 capsule (20 mg total) by mouth daily before breakfast.   Flovent HFA 110 MCG/ACT inhaler Generic drug: fluticasone Inhale 2 puffs into the lungs in the morning and at bedtime. Started by: Coralyn Helling, MD   Fluocinolone Acetonide Scalp 0.01 % Oil APPLY TO SCALP AT BEDTIME, WASH OUT WHEN WAKE UP   gabapentin 600 MG tablet Commonly known as: NEURONTIN Take 600 mg by mouth 2 (two) times daily.   HYDROcodone-homatropine 5-1.5 MG/5ML syrup Commonly known as: HYCODAN Take 5 mLs by mouth every 8 (eight) hours as needed for cough.   ketoconazole 2 % cream Commonly known as: NIZORAL Apply 1 application topically daily.   ondansetron 4 MG tablet Commonly known as: Zofran Take 1 tablet  (4 mg total) by mouth every 8 (eight) hours as needed for nausea or vomiting.   Oxycodone HCl 10 MG Tabs Take by mouth.   Vitamin D 50 MCG (2000 UT) Caps Take 2,000 Units by mouth daily.   Xarelto 20 MG Tabs tablet Generic drug: rivaroxaban TAKE 1 TABLET (20 MG TOTAL) BY MOUTH DAILY WITH SUPPER.       Past Surgical History:  He  has a past surgical history that includes Hammer toe surgery.  Family History:  His family history includes Colon cancer in his maternal aunt; Dementia in his mother; Lung cancer in his maternal aunt and maternal uncle.  Social  History:  He  reports that he quit smoking about 36 years ago. His smoking use included cigarettes. He quit after 0.50 years of use. He has never used smokeless tobacco. He reports that he does not drink alcohol and does not use drugs.

## 2019-11-11 NOTE — Patient Instructions (Signed)
Flovent two puffs in the morning and two puffs in the evening; rinse your mouth after each use  Ventolin two puffs every 6 hours as needed for cough, wheezing, chest congestion, or shortness of breath  Will schedule pulmonary function test  Follow up in 6 to 8 weeks with Dr. Craige Cotta or Nurse Practitioner

## 2019-11-25 ENCOUNTER — Other Ambulatory Visit: Payer: Self-pay

## 2019-11-28 ENCOUNTER — Encounter: Payer: Self-pay | Admitting: Family Medicine

## 2019-11-28 ENCOUNTER — Ambulatory Visit (INDEPENDENT_AMBULATORY_CARE_PROVIDER_SITE_OTHER): Payer: Medicare Other | Admitting: Family Medicine

## 2019-11-28 ENCOUNTER — Other Ambulatory Visit: Payer: Self-pay

## 2019-11-28 VITALS — BP 115/80 | HR 64 | Temp 98.2°F | Ht 77.0 in | Wt 223.4 lb

## 2019-11-28 DIAGNOSIS — M1A0710 Idiopathic chronic gout, right ankle and foot, without tophus (tophi): Secondary | ICD-10-CM

## 2019-11-28 DIAGNOSIS — K219 Gastro-esophageal reflux disease without esophagitis: Secondary | ICD-10-CM | POA: Diagnosis not present

## 2019-11-28 DIAGNOSIS — Z125 Encounter for screening for malignant neoplasm of prostate: Secondary | ICD-10-CM | POA: Diagnosis not present

## 2019-11-28 DIAGNOSIS — G360 Neuromyelitis optica [Devic]: Secondary | ICD-10-CM

## 2019-11-28 LAB — URIC ACID: Uric Acid, Serum: 3.6 mg/dL — ABNORMAL LOW (ref 4.0–7.8)

## 2019-11-28 LAB — PSA, MEDICARE: PSA: 1.66 ng/ml (ref 0.10–4.00)

## 2019-11-28 NOTE — Patient Instructions (Signed)
Nice to see you. We will get lab work today and contact you with results. Please call the number on the form and see if we can submit the appeal for you.  Please let us know if we can do this for you.  If we need to sign something please let us know.

## 2019-11-28 NOTE — Progress Notes (Signed)
[name], MD Phone: [phone]  Matthew Juarez is a 59 y.o. male who presents today for f/u.  Gout: no recent flares. Taking allopurinol.  GERD:   Reflux symptoms: no   Abd pain: no   Blood in stool: no  Dysphagia: no    Medication: taking nexium  Neuromyelitis optica: Patient notes this is stable.  There are no changes to his leg weakness.  He has chronic weakness in his legs that limits his mobility by ambulation.  He has been in a wheelchair that appears to be a group 3 power wheelchair with suspension based on visual inspection of the wheelchair.  He uses this for most of his mobility needs to get around his house.  He uses it to help him bathe himself and to get to the shower.  He uses it to go to the toilet.  He also uses it as an assistance device to help him dress and help him eat.  He uses it to help him wash dishes and do laundry.  He notes he occasionally gets up and moves on his legs throughout the also cannot do this for any significant period of time.  He notes using the power wheelchair for the vast majority of his mobility needs while he is awake.  He notes his request for a new group 3 power wheelchair was declined by his insurance.   Social History   Tobacco Use  Smoking Status Former Smoker  . Years: 0.50  . Types: Cigarettes  . Quit date: 04/01/1983  . Years since quitting: 36.6  Smokeless Tobacco Never Used  Tobacco Comment   only snmoke 2- cigsper day when he smoked     ROS see history of present illness  Objective  Physical Exam Vitals:   11/28/19 1035  BP: 115/80  Pulse: 64  Temp: 98.2 F (36.8 C)  SpO2: 95%    BP Readings from Last 3 Encounters:  11/28/19 115/80  11/11/19 (!) 146/84  09/02/19 (!) 150/80   Wt Readings from Last 3 Encounters:  11/28/19 223 lb 6.4 oz (101.3 kg)  11/11/19 290 lb (131.5 kg)  09/21/19 287 lb (130.2 kg)    Physical Exam Constitutional:      General: He is not in acute distress.    Appearance: He is  not diaphoretic.  Cardiovascular:     Rate and Rhythm: Normal rate and regular rhythm.     Heart sounds: Normal heart sounds.  Pulmonary:     Effort: Pulmonary effort is normal.     Breath sounds: Normal breath sounds.  Skin:    General: Skin is warm and dry.  Neurological:     Mental Status: He is alert.     Comments: Strength exam done while patient was seated, 5/5 strength in bilateral biceps, triceps, grip, quads, hamstrings, plantar and dorsiflexion, sensation to light touch intact in bilateral UE and LE, on trying to rise from his wheelchair the patient appears weak and unsteady when trying to bear his own weight      Assessment/Plan: Please see individual problem list.  Neuromyelitis optica (HCC) This is stable.  His group 3 power wheelchair was declined.  I do think he meets criteria for this based on what they have outlined.  I have encouraged the patient to do a appeal for this.  He will contact the number to see if we can do the appeal for him.  He will let us know if we need to sign something for this or  if we can just submit the appeal.  He would seem to benefit from the wheelchair suspension that is in a group 3 power wheelchair given his neuromyelitis optica history.  He also requires a power wheelchair to help him complete his ADLs at home and requires a power wheelchair to get around inside of his home as it is quite difficult for him to ambulate for any significant distances.  He can operate a power wheelchair safely on his own.  GERD (gastroesophageal reflux disease) Adequate control.  Continue Nexium.  Gout Asymptomatic.  Continue allopurinol.  Check uric acid.   Health Maintenance: PSA today.   Orders Placed This Encounter  Procedures  . PSA, Medicare ( [place] only)  . Uric acid    No orders of the defined types were placed in this encounter.   This visit occurred during the SARS-CoV-2 public health emergency.  Safety protocols were in place,  including screening questions prior to the visit, additional usage of staff PPE, and extensive cleaning of exam room while observing appropriate contact time as indicated for disinfecting solutions.    [name], MD [facility]

## 2019-11-28 NOTE — Assessment & Plan Note (Signed)
This is stable.  His group 3 power wheelchair was declined.  I do think he meets criteria for this based on what they have outlined.  I have encouraged the patient to do a appeal for this.  He will contact the number to see if we can do the appeal for him.  He will let us know if we need to sign something for this or if we can just submit the appeal.  He would seem to benefit from the wheelchair suspension that is in a group 3 power wheelchair given his neuromyelitis optica history.  He also requires a power wheelchair to help him complete his ADLs at home and requires a power wheelchair to get around inside of his home as it is quite difficult for him to ambulate for any significant distances.  He can operate a power wheelchair safely on his own.

## 2019-11-28 NOTE — Assessment & Plan Note (Signed)
Adequate control.  Continue Nexium.

## 2019-11-28 NOTE — Assessment & Plan Note (Addendum)
Asymptomatic.  Continue allopurinol.  Check uric acid. 

## 2019-12-01 ENCOUNTER — Other Ambulatory Visit: Payer: Self-pay | Admitting: Family Medicine

## 2019-12-01 DIAGNOSIS — R972 Elevated prostate specific antigen [PSA]: Secondary | ICD-10-CM

## 2019-12-09 ENCOUNTER — Other Ambulatory Visit
Admission: RE | Admit: 2019-12-09 | Discharge: 2019-12-09 | Disposition: A | Discharge status: Home / Self Care | Payer: Medicare Other | Source: Ambulatory Visit | Attending: Pulmonary Disease | Admitting: Pulmonary Disease

## 2019-12-09 ENCOUNTER — Other Ambulatory Visit: Payer: Self-pay

## 2019-12-09 DIAGNOSIS — Z01812 Encounter for preprocedural laboratory examination: Secondary | ICD-10-CM | POA: Diagnosis not present

## 2019-12-09 DIAGNOSIS — Z20822 Contact with and (suspected) exposure to covid-19: Secondary | ICD-10-CM | POA: Diagnosis not present

## 2019-12-10 LAB — SARS CORONAVIRUS 2 (TAT 6-24 HRS): SARS Coronavirus 2: NEGATIVE

## 2019-12-12 ENCOUNTER — Other Ambulatory Visit: Payer: Self-pay

## 2019-12-12 ENCOUNTER — Ambulatory Visit: Payer: Medicare Other | Attending: Pulmonary Disease

## 2019-12-12 DIAGNOSIS — R059 Cough, unspecified: Secondary | ICD-10-CM

## 2019-12-12 DIAGNOSIS — J449 Chronic obstructive pulmonary disease, unspecified: Secondary | ICD-10-CM | POA: Diagnosis not present

## 2019-12-12 DIAGNOSIS — Z8616 Personal history of COVID-19: Secondary | ICD-10-CM

## 2019-12-12 DIAGNOSIS — R05 Cough: Secondary | ICD-10-CM | POA: Diagnosis present

## 2019-12-12 LAB — PULMONARY FUNCTION TEST ARMC ONLY
DL/VA % pred: 104 %
DL/VA: 4.29 ml/min/mmHg/L
DLCO unc % pred: 70 %
DLCO unc: 23.78 ml/min/mmHg
FEF 25-75 Post: 3.69 L/sec
FEF 25-75 Pre: 2.19 L/sec
FEF2575-%Change-Post: 68 %
FEF2575-%Pred-Post: 103 %
FEF2575-%Pred-Pre: 61 %
FEV1-%Change-Post: 17 %
FEV1-%Pred-Post: 86 %
FEV1-%Pred-Pre: 73 %
FEV1-Post: 3.43 L
FEV1-Pre: 2.91 L
FEV1FVC-%Change-Post: 0 %
FEV1FVC-%Pred-Pre: 91 %
FEV6-%Change-Post: 20 %
FEV6-%Pred-Post: 96 %
FEV6-%Pred-Pre: 79 %
FEV6-Post: 4.76 L
FEV6-Pre: 3.94 L
FEV6FVC-%Change-Post: -1 %
FEV6FVC-%Pred-Post: 101 %
FEV6FVC-%Pred-Pre: 103 %
FVC-%Change-Post: 18 %
FVC-%Pred-Post: 94 %
FVC-%Pred-Pre: 79 %
FVC-Post: 4.85 L
FVC-Pre: 4.08 L
Post FEV1/FVC ratio: 71 %
Post FEV6/FVC ratio: 98 %
Pre FEV1/FVC ratio: 71 %
Pre FEV6/FVC Ratio: 100 %
RV % pred: 117 %
RV: 3.11 L
TLC % pred: 92 %
TLC: 7.78 L

## 2019-12-12 MED ORDER — ALBUTEROL SULFATE (2.5 MG/3ML) 0.083% IN NEBU
2.5000 mg | INHALATION_SOLUTION | Freq: Once | RESPIRATORY_TRACT | Status: AC
  Administered 2019-12-12: 2.5 mg via RESPIRATORY_TRACT
  Filled 2019-12-12: qty 3

## 2019-12-14 ENCOUNTER — Ambulatory Visit: Payer: Self-pay | Admitting: Urology

## 2019-12-15 DIAGNOSIS — G360 Neuromyelitis optica [Devic]: Secondary | ICD-10-CM | POA: Diagnosis not present

## 2019-12-21 ENCOUNTER — Encounter: Payer: Self-pay | Admitting: Urology

## 2019-12-21 ENCOUNTER — Ambulatory Visit (INDEPENDENT_AMBULATORY_CARE_PROVIDER_SITE_OTHER): Payer: BC Managed Care – PPO | Admitting: Urology

## 2019-12-21 ENCOUNTER — Other Ambulatory Visit: Payer: Self-pay

## 2019-12-21 VITALS — BP 150/87 | HR 63 | Ht 77.0 in | Wt 296.0 lb

## 2019-12-21 DIAGNOSIS — R972 Elevated prostate specific antigen [PSA]: Secondary | ICD-10-CM

## 2019-12-21 NOTE — Patient Instructions (Signed)
Prostate Cancer Screening  Prostate cancer screening is a test that is done to check for the presence of prostate cancer in men. The prostate gland is a walnut-sized gland that is located below the bladder and in front of the rectum in males. The function of the prostate is to add fluid to semen during ejaculation. Prostate cancer is the second most common type of cancer in men. Who should have prostate cancer screening?  Screening recommendations vary based on age and other risk factors. Screening is recommended if:  You are older than age 55. If you are age 55-69, talk with your health care provider about your need for screening and how often screening should be done. Because most prostate cancers are slow growing and will not cause death, screening is generally reserved in this age group for men who have a 10-15-year life expectancy.  You are younger than age 55, and you have these risk factors: ? Being a black male or a male of African descent. ? Having a father, brother, or uncle who has been diagnosed with prostate cancer. The risk is higher if your family member's cancer occurred at an early age. Screening is not recommended if:  You are younger than age 40.  You are between the ages of 40 and 54 and you have no risk factors.  You are 70 years of age or older. At this age, the risks that screening can cause are greater than the benefits that it may provide. If you are at high risk for prostate cancer, your health care provider may recommend that you have screenings more often or that you start screening at a younger age. How is screening for prostate cancer done? The recommended prostate cancer screening test is a blood test called the prostate-specific antigen (PSA) test. PSA is a protein that is made in the prostate. As you age, your prostate naturally produces more PSA. Abnormally high PSA levels may be caused by:  Prostate cancer.  An enlarged prostate that is not caused by cancer  (benign prostatic hyperplasia, BPH). This condition is very common in older men.  A prostate gland infection (prostatitis). Depending on the PSA results, you may need more tests, such as:  A physical exam to check the size of your prostate gland.  Blood and imaging tests.  A procedure to remove tissue samples from your prostate gland for testing (biopsy). What are the benefits of prostate cancer screening?  Screening can help to identify cancer at an early stage, before symptoms start and when the cancer can be treated more easily.  There is a small chance that screening may lower your risk of dying from prostate cancer. The chance is small because prostate cancer is a slow-growing cancer, and most men with prostate cancer die from a different cause. What are the risks of prostate cancer screening? The main risk of prostate cancer screening is diagnosing and treating prostate cancer that would never have caused any symptoms or problems. This is called overdiagnosisand overtreatment. PSA screening cannot tell you if your PSA is high due to cancer or a different cause. A prostate biopsy is the only procedure to diagnose prostate cancer. Even the results of a biopsy may not tell you if your cancer needs to be treated. Slow-growing prostate cancer may not need any treatment other than monitoring, so diagnosing and treating it may cause unnecessary stress or other side effects. A prostate biopsy may also cause:  Infection or fever.  A false negative. This is   a result that shows that you do not have prostate cancer when you actually do have prostate cancer. Questions to ask your health care provider  When should I start prostate cancer screening?  What is my risk for prostate cancer?  How often do I need screening?  What type of screening tests do I need?  How do I get my test results?  What do my results mean?  Do I need treatment? Where to find more information  The American Cancer  Society: www.cancer.org  American Urological Association: www.auanet.org Contact a health care provider if:  You have difficulty urinating.  You have pain when you urinate or ejaculate.  You have blood in your urine or semen.  You have pain in your back or in the area of your prostate. Summary  Prostate cancer is a common type of cancer in men. The prostate gland is located below the bladder and in front of the rectum. This gland adds fluid to semen during ejaculation.  Prostate cancer screening may identify cancer at an early stage, when the cancer can be treated more easily.  The prostate-specific antigen (PSA) test is the recommended screening test for prostate cancer.  Discuss the risks and benefits of prostate cancer screening with your health care provider. If you are age 70 or older, the risks that screening can cause are greater than the benefits that it may provide. This information is not intended to replace advice given to you by your health care provider. Make sure you discuss any questions you have with your health care provider. Document Revised: 10/28/2018 Document Reviewed: 10/28/2018 Elsevier Patient Education  2020 Elsevier Inc.  

## 2019-12-21 NOTE — Progress Notes (Signed)
12/21/19 12:33 PM   Matthew Juarez 10-07-1960 [id number]  CC: PSA screening  HPI: [name] is a 59 year old male with a number of comorbidities including CAD, transverse myelitis(wheelchair dependent), and history of PE on anticoagulation who was recently found to have a PSA of 1.66 which was increased from prior.  PSA was previously 0.87 in July 2017, and 0.46 in 2018, 2019, and 2020.  He denies any gross hematuria or urinary symptoms.  He denies any bone pain or weight loss.  He denies any family history of prostate or breast cancer.  He has never undergone previous prostate biopsy.   PMH: Past Medical History:  Diagnosis Date  . Arthritis   . Cardiac arrest (HCC) 06/23/2012  . Chronic back pain   . COVID-19 04/19/2019  . Gout   . Hammer toe   . Headache(784.0)   . History of blood clots   . Hypertension   . Pulmonary embolism (HCC)   . Pulmonary infarction (HCC) 07/23/2012   Overview:  Last Assessment & Plan:  With associated R heart failure and cardiac arrest.  - you could make an argument to treat him with lifelong coumadin given the severity of his PE, although the literature supports 12 months.  - he will be treated (at least) 12 months, to be followed by Dr Renae Gloss.  - will need hypercoag w/u a month after the coumadin is stopped - restart coumadin if at any incr  . Transverse myelitis [facility])     Surgical History: Past Surgical History:  Procedure Laterality Date  . HAMMER TOE SURGERY     2016    Family History: Family History  Problem Relation Age of Onset  . Dementia Mother   . Lung cancer Maternal Uncle   . Colon cancer Maternal Aunt   . Lung cancer Maternal Aunt   . Heart disease Neg Hx     Social History:  reports that he quit smoking about 36 years ago. His smoking use included cigarettes. He quit after 0.50 years of use. He has never used smokeless tobacco. He reports that he does not drink alcohol and does not use drugs.  Physical Exam: BP (!) 150/87    Pulse 63   Ht 6\' 5"  (1.956 m)   Wt 296 lb (134.3 kg)   BMI 35.10 kg/m    Constitutional: In wheelchair Cardiovascular: No clubbing, cyanosis, or edema. Respiratory: Normal respiratory effort, no increased work of breathing. GI: Abdomen is soft, nontender, nondistended, no abdominal masses  Laboratory Data: PSA history reviewed, see HPI  Pertinent Imaging: None to review  Assessment & Plan:   In summary, he is a comorbid 59 year old male with a slight rise in his PSA to 1.66 from prior values ranging from 0.5-0.9.  We reviewed the AUA guidelines regarding PSA screening and the risks and benefits at length.  With his comorbidities, recommend repeating a PSA in 2 to 3 months to establish a trend prior to considering biopsy, especially with his anticoagulation and history of PE.  We reviewed the implications of a rising PSA and the uncertainty surrounding it. In general, a man's PSA increases with age and is produced by both normal and cancerous prostate tissue. The differential diagnosis for elevated PSA includes BPH, prostate cancer, infection, recent intercourse/ejaculation, recent urethroscopic manipulation (foley placement/cystoscopy) or trauma, and prostatitis.   Management of an elevated PSA can include observation or prostate biopsy and we discussed this in detail. Our goal is to detect clinically significant prostate cancers, and  manage with either active surveillance, surgery, or radiation for localized disease. Risks of prostate biopsy include bleeding, infection (including life threatening sepsis), pain, and lower urinary symptoms. Hematuria, hematospermia, and blood in the stool are all common after biopsy and can persist up to 4 weeks.   Follow-up in 2 to 3 months for repeat PSA  Legrand Rams, MD 12/21/2019  [facility] [address] [place], [place] [address] [phone]

## 2020-01-01 ENCOUNTER — Other Ambulatory Visit: Payer: Self-pay | Admitting: Family Medicine

## 2020-01-11 DIAGNOSIS — Z79891 Long term (current) use of opiate analgesic: Secondary | ICD-10-CM | POA: Diagnosis not present

## 2020-01-11 DIAGNOSIS — G8929 Other chronic pain: Secondary | ICD-10-CM | POA: Diagnosis not present

## 2020-01-11 DIAGNOSIS — M25512 Pain in left shoulder: Secondary | ICD-10-CM | POA: Diagnosis not present

## 2020-01-11 DIAGNOSIS — G894 Chronic pain syndrome: Secondary | ICD-10-CM | POA: Diagnosis not present

## 2020-02-11 ENCOUNTER — Other Ambulatory Visit: Payer: Self-pay | Admitting: Family Medicine

## 2020-02-27 DIAGNOSIS — Z79899 Other long term (current) drug therapy: Secondary | ICD-10-CM | POA: Diagnosis not present

## 2020-02-27 DIAGNOSIS — G894 Chronic pain syndrome: Secondary | ICD-10-CM | POA: Diagnosis not present

## 2020-02-27 DIAGNOSIS — Z23 Encounter for immunization: Secondary | ICD-10-CM | POA: Diagnosis not present

## 2020-02-27 DIAGNOSIS — G360 Neuromyelitis optica [Devic]: Secondary | ICD-10-CM | POA: Diagnosis not present

## 2020-03-15 ENCOUNTER — Other Ambulatory Visit: Payer: Medicare Other

## 2020-03-15 ENCOUNTER — Other Ambulatory Visit: Payer: Self-pay

## 2020-03-15 DIAGNOSIS — R972 Elevated prostate specific antigen [PSA]: Secondary | ICD-10-CM

## 2020-03-16 LAB — PSA TOTAL (REFLEX TO FREE): Prostate Specific Ag, Serum: 0.6 ng/mL (ref 0.0–4.0)

## 2020-03-21 ENCOUNTER — Other Ambulatory Visit: Payer: Self-pay

## 2020-03-21 ENCOUNTER — Ambulatory Visit (INDEPENDENT_AMBULATORY_CARE_PROVIDER_SITE_OTHER): Payer: Medicare Other | Admitting: Urology

## 2020-03-21 VITALS — BP 135/84 | HR 67 | Ht 77.0 in | Wt 296.0 lb

## 2020-03-21 DIAGNOSIS — Z125 Encounter for screening for malignant neoplasm of prostate: Secondary | ICD-10-CM

## 2020-03-21 NOTE — Progress Notes (Signed)
   03/21/2020 5:00 PM   Matthew Juarez 04-05-1960 [id number]  Reason for visit: Follow up PSA screening  HPI: I saw Mr. Bozzi back in urology clinic today for PSA screening.  He is a 59 year old with a number of comorbidities including CAD, transverse myelitis(wheelchair dependent), and history of PE on anticoagulation who was recently found to have a PSA of 1.66 which was increased from prior.  PSA was previously 0.87 in July 2017, and 0.46 in 2018, 2019, and 2020.  We opted for a repeat PSA in 3 months.  PSA decreased to 0.6 which is back within his baseline over the last 5 years.  Reassurance was provided at length today.  We reviewed the AUA guidelines regarding the risks and benefits of PSA screening.  With his number of comorbidities, it is certainly reasonable to space screening to every other year, or even consider discontinuing screening.  PCP follow-up for PSA screening every other year up to age 40   Sondra Come, MD  [facility] [address] [place], [place] [address] [phone]

## 2020-03-21 NOTE — Patient Instructions (Signed)
Prostate Cancer Screening  Prostate cancer screening is a test that is done to check for the presence of prostate cancer in men. The prostate gland is a walnut-sized gland that is located below the bladder and in front of the rectum in males. The function of the prostate is to add fluid to semen during ejaculation. Prostate cancer is the second most common type of cancer in men. Who should have prostate cancer screening?  Screening recommendations vary based on age and other risk factors. Screening is recommended if:  You are older than age 55. If you are age 55-69, talk with your health care provider about your need for screening and how often screening should be done. Because most prostate cancers are slow growing and will not cause death, screening is generally reserved in this age group for men who have a 10-15-year life expectancy.  You are younger than age 55, and you have these risk factors: ? Being a black male or a male of African descent. ? Having a father, brother, or uncle who has been diagnosed with prostate cancer. The risk is higher if your family member's cancer occurred at an early age. Screening is not recommended if:  You are younger than age 40.  You are between the ages of 40 and 54 and you have no risk factors.  You are 70 years of age or older. At this age, the risks that screening can cause are greater than the benefits that it may provide. If you are at high risk for prostate cancer, your health care provider may recommend that you have screenings more often or that you start screening at a younger age. How is screening for prostate cancer done? The recommended prostate cancer screening test is a blood test called the prostate-specific antigen (PSA) test. PSA is a protein that is made in the prostate. As you age, your prostate naturally produces more PSA. Abnormally high PSA levels may be caused by:  Prostate cancer.  An enlarged prostate that is not caused by cancer  (benign prostatic hyperplasia, BPH). This condition is very common in older men.  A prostate gland infection (prostatitis). Depending on the PSA results, you may need more tests, such as:  A physical exam to check the size of your prostate gland.  Blood and imaging tests.  A procedure to remove tissue samples from your prostate gland for testing (biopsy). What are the benefits of prostate cancer screening?  Screening can help to identify cancer at an early stage, before symptoms start and when the cancer can be treated more easily.  There is a small chance that screening may lower your risk of dying from prostate cancer. The chance is small because prostate cancer is a slow-growing cancer, and most men with prostate cancer die from a different cause. What are the risks of prostate cancer screening? The main risk of prostate cancer screening is diagnosing and treating prostate cancer that would never have caused any symptoms or problems. This is called overdiagnosisand overtreatment. PSA screening cannot tell you if your PSA is high due to cancer or a different cause. A prostate biopsy is the only procedure to diagnose prostate cancer. Even the results of a biopsy may not tell you if your cancer needs to be treated. Slow-growing prostate cancer may not need any treatment other than monitoring, so diagnosing and treating it may cause unnecessary stress or other side effects. A prostate biopsy may also cause:  Infection or fever.  A false negative. This is   a result that shows that you do not have prostate cancer when you actually do have prostate cancer. Questions to ask your health care provider  When should I start prostate cancer screening?  What is my risk for prostate cancer?  How often do I need screening?  What type of screening tests do I need?  How do I get my test results?  What do my results mean?  Do I need treatment? Where to find more information  The American Cancer  Society: www.cancer.org  American Urological Association: www.auanet.org Contact a health care provider if:  You have difficulty urinating.  You have pain when you urinate or ejaculate.  You have blood in your urine or semen.  You have pain in your back or in the area of your prostate. Summary  Prostate cancer is a common type of cancer in men. The prostate gland is located below the bladder and in front of the rectum. This gland adds fluid to semen during ejaculation.  Prostate cancer screening may identify cancer at an early stage, when the cancer can be treated more easily.  The prostate-specific antigen (PSA) test is the recommended screening test for prostate cancer.  Discuss the risks and benefits of prostate cancer screening with your health care provider. If you are age 70 or older, the risks that screening can cause are greater than the benefits that it may provide. This information is not intended to replace advice given to you by your health care provider. Make sure you discuss any questions you have with your health care provider. Document Revised: 10/28/2018 Document Reviewed: 10/28/2018 Elsevier Patient Education  2020 Elsevier Inc.  

## 2020-04-12 DIAGNOSIS — R269 Unspecified abnormalities of gait and mobility: Secondary | ICD-10-CM | POA: Diagnosis not present

## 2020-04-12 DIAGNOSIS — G360 Neuromyelitis optica [Devic]: Secondary | ICD-10-CM | POA: Diagnosis not present

## 2020-04-12 DIAGNOSIS — S82309A Unspecified fracture of lower end of unspecified tibia, initial encounter for closed fracture: Secondary | ICD-10-CM | POA: Diagnosis not present

## 2020-04-12 DIAGNOSIS — G8220 Paraplegia, unspecified: Secondary | ICD-10-CM | POA: Diagnosis not present

## 2020-05-23 ENCOUNTER — Ambulatory Visit (INDEPENDENT_AMBULATORY_CARE_PROVIDER_SITE_OTHER): Payer: Medicare Other | Admitting: Family Medicine

## 2020-05-23 ENCOUNTER — Other Ambulatory Visit: Payer: Self-pay

## 2020-05-23 ENCOUNTER — Encounter: Payer: Self-pay | Admitting: Family Medicine

## 2020-05-23 VITALS — BP 118/80 | HR 65 | Temp 98.3°F | Ht 77.0 in | Wt 298.6 lb

## 2020-05-23 DIAGNOSIS — Z86711 Personal history of pulmonary embolism: Secondary | ICD-10-CM

## 2020-05-23 DIAGNOSIS — G360 Neuromyelitis optica [Devic]: Secondary | ICD-10-CM

## 2020-05-23 DIAGNOSIS — R7303 Prediabetes: Secondary | ICD-10-CM

## 2020-05-23 DIAGNOSIS — E785 Hyperlipidemia, unspecified: Secondary | ICD-10-CM | POA: Diagnosis not present

## 2020-05-23 DIAGNOSIS — Z23 Encounter for immunization: Secondary | ICD-10-CM

## 2020-05-23 LAB — HEMOGLOBIN A1C: Hgb A1c MFr Bld: 5.8 % (ref 4.6–6.5)

## 2020-05-23 LAB — COMPREHENSIVE METABOLIC PANEL
ALT: 14 U/L (ref 0–53)
AST: 15 U/L (ref 0–37)
Albumin: 4.2 g/dL (ref 3.5–5.2)
Alkaline Phosphatase: 101 U/L (ref 39–117)
BUN: 15 mg/dL (ref 6–23)
CO2: 30 mEq/L (ref 19–32)
Calcium: 9.4 mg/dL (ref 8.4–10.5)
Chloride: 102 mEq/L (ref 96–112)
Creatinine, Ser: 0.87 mg/dL (ref 0.40–1.50)
GFR: 93.1 mL/min (ref >60.00)
Glucose, Bld: 87 mg/dL (ref 70–99)
Potassium: 4.2 mEq/L (ref 3.5–5.1)
Sodium: 138 mEq/L (ref 135–145)
Total Bilirubin: 1.6 mg/dL — ABNORMAL HIGH (ref 0.2–1.2)
Total Protein: 6.2 g/dL (ref 6.0–8.3)

## 2020-05-23 LAB — CBC
HCT: 41.2 % (ref 39.0–52.0)
Hemoglobin: 14.1 g/dL (ref 13.0–17.0)
MCHC: 34.2 g/dL (ref 30.0–36.0)
MCV: 85.4 fl (ref 78.0–100.0)
Platelets: 165 10*3/uL (ref 150.0–400.0)
RBC: 4.83 Mil/uL (ref 4.22–5.81)
RDW: 13.5 % (ref 11.5–15.5)
WBC: 4.2 10*3/uL (ref 4.0–10.5)

## 2020-05-23 LAB — LIPID PANEL
Cholesterol: 125 mg/dL (ref 0–200)
HDL: 46.5 mg/dL (ref >39.00)
LDL Cholesterol: 62 mg/dL (ref 0–99)
NonHDL: 78.45
Total CHOL/HDL Ratio: 3
Triglycerides: 81 mg/dL (ref 0.0–149.0)
VLDL: 16.2 mg/dL (ref 0.0–40.0)

## 2020-05-23 NOTE — Progress Notes (Signed)
Matthew Rumps, MD Phone: [phone]  Matthew Juarez is a 60 y.o. male who presents today for f/u.  History of PE: Patient is on Xarelto 20 mg daily.  No chest pain, shortness of breath, or edema.  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  He denies any changes to his strength.  No numbness.  He notes he did get a new wheelchair that was too small and they had to get him another one.  Prediabetes: No polyuria or polydipsia.  He has been doing leg exercises and arm exercises every other day.  He has been juicing and eating salads and fruit.  Eating baked chicken.  1 diet soda per day.  Occasional sweet tea.  Not much junk food.  Patient reports he has jury duty.  He notes he has significant transportation issues with this given his wheelchair and neuromyelitis optica status.  Physically he could withstand jury duty once he is there.  Social History   Tobacco Use  Smoking Status Former Smoker  . Years: 0.50  . Types: Cigarettes  . Quit date: 04/01/1983  . Years since quitting: 37.1  Smokeless Tobacco Never Used  Tobacco Comment   only snmoke 2- cigsper day when he smoked    Current Outpatient Medications on File Prior to Visit  Medication Sig Dispense Refill  . acetaminophen (TYLENOL) 500 MG tablet Take 500 mg by mouth every 6 (six) hours as needed for mild pain.    Marland Kitchen albuterol (VENTOLIN HFA) 108 (90 Base) MCG/ACT inhaler Inhale 2 puffs into the lungs every 6 (six) hours as needed for wheezing or shortness of breath. 6.7 g 5  . allopurinol (ZYLOPRIM) 300 MG tablet TAKE 1 TABLET BY MOUTH EVERY DAY 90 tablet 3  . atorvastatin (LIPITOR) 40 MG tablet TAKE 1 TABLET BY MOUTH EVERY DAY 90 tablet 3  . Cholecalciferol (VITAMIN D) 2000 UNITS CAPS Take 2,000 Units by mouth daily.    . Clobetasol Propionate 0.05 % lotion APPLY TO AFFECTED AREA TWICE A DAY AS NEEDED    . Coenzyme Q10 (CO Q-10) 200 MG CAPS Take 1 capsule by mouth daily. 30 each 1  . desonide (DESOWEN) 0.05 %  cream APPLY TO AFECTED AREA TWICE A DAY AS NEEDED  3  . esomeprazole (NEXIUM) 20 MG capsule Take 1 capsule (20 mg total) by mouth daily before breakfast. 90 capsule 3  . Fluocinolone Acetonide Scalp 0.01 % OIL APPLY TO SCALP AT BEDTIME, WASH OUT WHEN WAKE UP  3  . fluticasone (FLOVENT HFA) 110 MCG/ACT inhaler Inhale 2 puffs into the lungs in the morning and at bedtime. 1 each 12  . gabapentin (NEURONTIN) 600 MG tablet Take 600 mg by mouth 2 (two) times daily.    Marland Kitchen ketoconazole (NIZORAL) 2 % cream Apply 1 application topically daily. 15 g 0  . ondansetron (ZOFRAN) 4 MG tablet Take 1 tablet (4 mg total) by mouth every 8 (eight) hours as needed for nausea or vomiting. 20 tablet 0  . Oxycodone HCl 10 MG TABS Take by mouth.    Matthew Juarez 20 MG TABS tablet TAKE 1 TABLET (20 MG TOTAL) BY MOUTH DAILY WITH SUPPER. 90 tablet 3   No current facility-administered medications on file prior to visit.     ROS see history of present illness  Objective  Physical Exam Vitals:   05/23/20 1043  BP: 118/80  Pulse: 65  Temp: 98.3 F (36.8 C)  SpO2: 98%    BP Readings from Last 3 Encounters:  05/23/20 118/80  03/21/20 135/84  12/21/19 (!) 150/87   Wt Readings from Last 3 Encounters:  05/23/20 298 lb 9.6 oz (135.4 kg)  03/21/20 296 lb (134.3 kg)  12/21/19 296 lb (134.3 kg)    Physical Exam Constitutional:      General: He is not in acute distress.    Appearance: He is not diaphoretic.  Cardiovascular:     Rate and Rhythm: Normal rate and regular rhythm.     Heart sounds: Normal heart sounds.  Pulmonary:     Effort: Pulmonary effort is normal.     Breath sounds: Normal breath sounds.  Musculoskeletal:        General: No edema.     Right lower leg: No edema.     Left lower leg: No edema.  Skin:    General: Skin is warm and dry.  Neurological:     Mental Status: He is alert.      Assessment/Plan: Please see individual problem list.  Problem List Items Addressed This Visit     History of pulmonary embolism    Continue Xarelto 20 mg daily.  We will check a CBC.      Relevant Orders   CBC   Hyperlipidemia    Check lipid panel.      Relevant Orders   Comp Met (CMET)   Lipid panel   Neuromyelitis optica ([place])    Stable.  He will continue to see neurology.  I did provide a letter outlining his transportation issues for his jury duty.      Prediabetes    Most recent A1c was in the normal range.  We will recheck an A1c today.  Continue diet and exercise.      Relevant Orders   HgB A1c       This visit occurred during the SARS-CoV-2 public health emergency.  Safety protocols were in place, including screening questions prior to the visit, additional usage of staff PPE, and extensive cleaning of exam room while observing appropriate contact time as indicated for disinfecting solutions.    Matthew Rumps, MD [place]

## 2020-05-23 NOTE — Patient Instructions (Signed)
Nice to see you. We will get labs today. 

## 2020-05-23 NOTE — Assessment & Plan Note (Signed)
Most recent A1c was in the normal range.  We will recheck an A1c today.  Continue diet and exercise.

## 2020-05-23 NOTE — Assessment & Plan Note (Signed)
Stable.  He will continue to see neurology.  I did provide a letter outlining his transportation issues for his jury duty.

## 2020-05-23 NOTE — Assessment & Plan Note (Signed)
Continue Xarelto 20 mg daily.  We will check a CBC.

## 2020-05-23 NOTE — Assessment & Plan Note (Signed)
Check lipid panel  

## 2020-05-28 ENCOUNTER — Ambulatory Visit: Payer: Medicare Other | Admitting: Family Medicine

## 2020-06-13 ENCOUNTER — Telehealth: Payer: Self-pay | Admitting: Family Medicine

## 2020-06-13 ENCOUNTER — Telehealth: Payer: Self-pay | Admitting: Cardiovascular Disease

## 2020-06-13 MED ORDER — GABAPENTIN 600 MG PO TABS
600.0000 mg | ORAL_TABLET | Freq: Two times a day (BID) | ORAL | 1 refills | Status: DC
Start: 2020-06-13 — End: 2023-09-21

## 2020-06-13 NOTE — Addendum Note (Signed)
Addended byElise Benne T on: 06/13/2020 03:59 PM   Modules accepted: Orders

## 2020-06-13 NOTE — Telephone Encounter (Signed)
I spoke with pt and he is aware to contact PCP for his refill request.

## 2020-06-13 NOTE — Telephone Encounter (Signed)
*  STAT* If patient is at the pharmacy, call can be transferred to refill team.   1. Which medications need to be refilled? (please list name of each medication and dose if known) gabapentin 300 MG two times daily  2. Which pharmacy/location (including street and city if local pharmacy) is medication to be sent to? [facility] [place]  3. Do they need a 30 day or 90 day supply? 90 day

## 2020-06-13 NOTE — Telephone Encounter (Signed)
Pt called and said that Dr. Birdie Sons fills his gabapentin 300mg  twice daily  [facility] to [facility]

## 2020-06-19 DIAGNOSIS — R0602 Shortness of breath: Secondary | ICD-10-CM | POA: Diagnosis not present

## 2020-06-19 DIAGNOSIS — G360 Neuromyelitis optica [Devic]: Secondary | ICD-10-CM | POA: Diagnosis not present

## 2020-07-02 DIAGNOSIS — Z79891 Long term (current) use of opiate analgesic: Secondary | ICD-10-CM | POA: Diagnosis not present

## 2020-07-02 DIAGNOSIS — G8929 Other chronic pain: Secondary | ICD-10-CM | POA: Diagnosis not present

## 2020-07-02 DIAGNOSIS — M25512 Pain in left shoulder: Secondary | ICD-10-CM | POA: Diagnosis not present

## 2020-09-11 DIAGNOSIS — L218 Other seborrheic dermatitis: Secondary | ICD-10-CM | POA: Diagnosis not present

## 2020-09-21 ENCOUNTER — Ambulatory Visit (INDEPENDENT_AMBULATORY_CARE_PROVIDER_SITE_OTHER): Payer: Medicare Other

## 2020-09-21 VITALS — Ht 77.0 in | Wt 298.0 lb

## 2020-09-21 DIAGNOSIS — Z0000 Encounter for general adult medical examination without abnormal findings: Secondary | ICD-10-CM | POA: Diagnosis not present

## 2020-09-21 NOTE — Patient Instructions (Addendum)
Matthew Juarez , Thank you for taking time to come for your Medicare Wellness Visit. I appreciate your ongoing commitment to your health goals. Please review the following plan we discussed and let me know if I can assist you in the future.   These are the goals we discussed:  Goals       Patient Stated     Weight (lb) < 250 lb (113.4 kg) (pt-stated)      Stay active with daily chair exercise         This is a list of the screening recommended for you and due dates:  Health Maintenance  Topic Date Due   COVID-19 Vaccine (3 - Pfizer risk series) 10/07/2020*   Zoster (Shingles) Vaccine (1 of 2) 12/22/2020*   Pneumococcal Vaccination (1 - PCV) 09/21/2021*   Cologuard (Stool DNA test)  09/21/2021*   Flu Shot  10/29/2020   Tetanus Vaccine  04/01/2021   Hepatitis C Screening: USPSTF Recommendation to screen - Ages 18-79 yo.  Completed   HIV Screening  Completed   HPV Vaccine  Aged Out  *Topic was postponed. The date shown is not the original due date.    Advanced directives: not yet completed  Conditions/risks identified: none new  Follow up in one year for your annual wellness visit.   Preventive Care 7 Years and Older, Male Preventive care refers to lifestyle choices and visits with your health care provider that can promote health and wellness. What does preventive care include? A yearly physical exam. This is also called an annual well check. Dental exams once or twice a year. Routine eye exams. Ask your health care provider how often you should have your eyes checked. Personal lifestyle choices, including: Daily care of your teeth and gums. Regular physical activity. Eating a healthy diet. Avoiding tobacco and drug use. Limiting alcohol use. Practicing safe sex. Taking low doses of aspirin every day. Taking vitamin and mineral supplements as recommended by your health care provider. What happens during an annual well check? The services and screenings done by your  health care provider during your annual well check will depend on your age, overall health, lifestyle risk factors, and family history of disease. Counseling  Your health care provider may ask you questions about your: Alcohol use. Tobacco use. Drug use. Emotional well-being. Home and relationship well-being. Sexual activity. Eating habits. History of falls. Memory and ability to understand (cognition). Work and work Astronomer. Screening  You may have the following tests or measurements: Height, weight, and BMI. Blood pressure. Lipid and cholesterol levels. These may be checked every 5 years, or more frequently if you are over 2 years old. Skin check. Lung cancer screening. You may have this screening every year starting at age 36 if you have a 30-pack-year history of smoking and currently smoke or have quit within the past 15 years. Fecal occult blood test (FOBT) of the stool. You may have this test every year starting at age 51. Flexible sigmoidoscopy or colonoscopy. You may have a sigmoidoscopy every 5 years or a colonoscopy every 10 years starting at age 5. Prostate cancer screening. Recommendations will vary depending on your family history and other risks. Hepatitis C blood test. Hepatitis B blood test. Sexually transmitted disease (STD) testing. Diabetes screening. This is done by checking your blood sugar (glucose) after you have not eaten for a while (fasting). You may have this done every 1-3 years. Abdominal aortic aneurysm (AAA) screening. You may need this if you are a  current or former smoker. Osteoporosis. You may be screened starting at age 52 if you are at high risk. Talk with your health care provider about your test results, treatment options, and if necessary, the need for more tests. Vaccines  Your health care provider may recommend certain vaccines, such as: Influenza vaccine. This is recommended every year. Tetanus, diphtheria, and acellular pertussis  (Tdap, Td) vaccine. You may need a Td booster every 10 years. Zoster vaccine. You may need this after age 92. Pneumococcal 13-valent conjugate (PCV13) vaccine. One dose is recommended after age 36. Pneumococcal polysaccharide (PPSV23) vaccine. One dose is recommended after age 64. Talk to your health care provider about which screenings and vaccines you need and how often you need them. This information is not intended to replace advice given to you by your health care provider. Make sure you discuss any questions you have with your health care provider. Document Released: 04/13/2015 Document Revised: 12/05/2015 Document Reviewed: 01/16/2015 Elsevier Interactive Patient Education  2017 [place] Prevention in the Home Falls can cause injuries. They can happen to people of all ages. There are many things you can do to make your home safe and to help prevent falls. What can I do on the outside of my home? Regularly fix the edges of walkways and driveways and fix any cracks. Remove anything that might make you trip as you walk through a door, such as a raised step or threshold. Trim any bushes or trees on the path to your home. Use bright outdoor lighting. Clear any walking paths of anything that might make someone trip, such as rocks or tools. Regularly check to see if handrails are loose or broken. Make sure that both sides of any steps have handrails. Any raised decks and porches should have guardrails on the edges. Have any leaves, snow, or ice cleared regularly. Use sand or salt on walking paths during winter. Clean up any spills in your garage right away. This includes oil or grease spills. What can I do in the bathroom? Use night lights. Install grab bars by the toilet and in the tub and shower. Do not use towel bars as grab bars. Use non-skid mats or decals in the tub or shower. If you need to sit down in the shower, use a plastic, non-slip stool. Keep the floor dry. Clean  up any water that spills on the floor as soon as it happens. Remove soap buildup in the tub or shower regularly. Attach bath mats securely with double-sided non-slip rug tape. Do not have throw rugs and other things on the floor that can make you trip. What can I do in the bedroom? Use night lights. Make sure that you have a light by your bed that is easy to reach. Do not use any sheets or blankets that are too big for your bed. They should not hang down onto the floor. Have a firm chair that has side arms. You can use this for support while you get dressed. Do not have throw rugs and other things on the floor that can make you trip. What can I do in the kitchen? Clean up any spills right away. Avoid walking on wet floors. Keep items that you use a lot in easy-to-reach places. If you need to reach something above you, use a strong step stool that has a grab bar. Keep electrical cords out of the way. Do not use floor polish or wax that makes floors slippery. If you must use  wax, use non-skid floor wax. Do not have throw rugs and other things on the floor that can make you trip. What can I do with my stairs? Do not leave any items on the stairs. Make sure that there are handrails on both sides of the stairs and use them. Fix handrails that are broken or loose. Make sure that handrails are as long as the stairways. Check any carpeting to make sure that it is firmly attached to the stairs. Fix any carpet that is loose or worn. Avoid having throw rugs at the top or bottom of the stairs. If you do have throw rugs, attach them to the floor with carpet tape. Make sure that you have a light switch at the top of the stairs and the bottom of the stairs. If you do not have them, ask someone to add them for you. What else can I do to help prevent falls? Wear shoes that: Do not have high heels. Have rubber bottoms. Are comfortable and fit you well. Are closed at the toe. Do not wear sandals. If you  use a stepladder: Make sure that it is fully opened. Do not climb a closed stepladder. Make sure that both sides of the stepladder are locked into place. Ask someone to hold it for you, if possible. Clearly mark and make sure that you can see: Any grab bars or handrails. First and last steps. Where the edge of each step is. Use tools that help you move around (mobility aids) if they are needed. These include: Canes. Walkers. Scooters. Crutches. Turn on the lights when you go into a dark area. Replace any light bulbs as soon as they burn out. Set up your furniture so you have a clear path. Avoid moving your furniture around. If any of your floors are uneven, fix them. If there are any pets around you, be aware of where they are. Review your medicines with your doctor. Some medicines can make you feel dizzy. This can increase your chance of falling. Ask your doctor what other things that you can do to help prevent falls. This information is not intended to replace advice given to you by your health care provider. Make sure you discuss any questions you have with your health care provider. Document Released: 01/11/2009 Document Revised: 08/23/2015 Document Reviewed: 04/21/2014 Elsevier Interactive Patient Education  2017 [employer].

## 2020-09-21 NOTE — Progress Notes (Signed)
Subjective:   Matthew Juarez is a 60 y.o. male who presents for Medicare Annual/Subsequent preventive examination.  Review of Systems    No ROS.  Medicare Wellness Virtual Visit.  Visual/audio telehealth visit, UTA vital signs.   See social history for additional risk factors.   Cardiac Risk Factors include: advanced age (>2men, >53 women);male gender     Objective:    Today's Vitals   09/21/20 0904  Weight: 298 lb (135.2 kg)  Height: 6\' 5"  (1.956 m)   Body mass index is 35.34 kg/m.  Advanced Directives 09/21/2020 09/21/2019 07/17/2019 04/28/2019 09/20/2018 09/09/2017 08/21/2016  Does Patient Have a Medical Advance Directive? No No No No No No No  Would patient like information on creating a medical advance directive? No - Patient declined No - Patient declined - No - Patient declined No - Patient declined Yes (MAU/Ambulatory/Procedural Areas - Information given) Yes (MAU/Ambulatory/Procedural Areas - Information given)  Pre-existing out of facility DNR order (yellow form or pink MOST form) - - - - - - -    Current Medications (verified) Outpatient Encounter Medications as of 09/21/2020  Medication Sig   acetaminophen (TYLENOL) 500 MG tablet Take 500 mg by mouth every 6 (six) hours as needed for mild pain.   albuterol (VENTOLIN HFA) 108 (90 Base) MCG/ACT inhaler Inhale 2 puffs into the lungs every 6 (six) hours as needed for wheezing or shortness of breath.   allopurinol (ZYLOPRIM) 300 MG tablet TAKE 1 TABLET BY MOUTH EVERY DAY   atorvastatin (LIPITOR) 40 MG tablet TAKE 1 TABLET BY MOUTH EVERY DAY   Cholecalciferol (VITAMIN D) 2000 UNITS CAPS Take 2,000 Units by mouth daily.   Clobetasol Propionate 0.05 % lotion APPLY TO AFFECTED AREA TWICE A DAY AS NEEDED   Coenzyme Q10 (CO Q-10) 200 MG CAPS Take 1 capsule by mouth daily.   desonide (DESOWEN) 0.05 % cream APPLY TO AFECTED AREA TWICE A DAY AS NEEDED   esomeprazole (NEXIUM) 20 MG capsule Take 1 capsule (20 mg total) by mouth daily  before breakfast.   Fluocinolone Acetonide Scalp 0.01 % OIL APPLY TO SCALP AT BEDTIME, WASH OUT WHEN WAKE UP   fluticasone (FLOVENT HFA) 110 MCG/ACT inhaler Inhale 2 puffs into the lungs in the morning and at bedtime.   gabapentin (NEURONTIN) 600 MG tablet Take 1 tablet (600 mg total) by mouth 2 (two) times daily.   ketoconazole (NIZORAL) 2 % cream Apply 1 application topically daily.   ondansetron (ZOFRAN) 4 MG tablet Take 1 tablet (4 mg total) by mouth every 8 (eight) hours as needed for nausea or vomiting.   Oxycodone HCl 10 MG TABS Take by mouth.   XARELTO 20 MG TABS tablet TAKE 1 TABLET (20 MG TOTAL) BY MOUTH DAILY WITH SUPPER.   No facility-administered encounter medications on file as of 09/21/2020.    Allergies (verified) Patient has no known allergies.   History: Past Medical History:  Diagnosis Date   Arthritis    Cardiac arrest (HCC) 06/23/2012   Chronic back pain    COVID-19 04/19/2019   Gout    Hammer toe    Headache(784.0)    History of blood clots    Hypertension    Pulmonary embolism (HCC)    Pulmonary infarction (HCC) 07/23/2012   Overview:  Last Assessment & Plan:  With associated R heart failure and cardiac arrest.  - you could make an argument to treat him with lifelong coumadin given the severity of his PE, although the literature supports  12 months.  - he will be treated (at least) 12 months, to be followed by Dr Renae Gloss.  - will need hypercoag w/u a month after the coumadin is stopped - restart coumadin if at any incr   Transverse myelitis [facility])    Past Surgical History:  Procedure Laterality Date   HAMMER TOE SURGERY     2016   Family History  Problem Relation Age of Onset   Dementia Mother    Lung cancer Maternal Uncle    Colon cancer Maternal Aunt    Lung cancer Maternal Aunt    Heart disease Neg Hx    Social History   Socioeconomic History   Marital status: Married    Spouse name: Not on file   Number of children: Not on file   Years of  education: Not on file   Highest education level: Not on file  Occupational History   Occupation: disability  Tobacco Use   Smoking status: Former    Years: 0.50    Pack years: 0.00    Types: Cigarettes    Quit date: 04/01/1983    Years since quitting: 37.5   Smokeless tobacco: Never   Tobacco comments:    only snmoke 2- cigsper day when he smoked  Vaping Use   Vaping Use: Never used  Substance and Sexual Activity   Alcohol use: No    Alcohol/week: 0.0 standard drinks   Drug use: No   Sexual activity: Not Currently  Other Topics Concern   Not on file  Social History Narrative   Not on file   Social Determinants of Health   Financial Resource Strain: Low Risk    Difficulty of Paying Living Expenses: Not hard at all  Food Insecurity: No Food Insecurity   Worried About Programme researcher, broadcasting/film/video in the Last Year: Never true   Ran Out of Food in the Last Year: Never true  Transportation Needs: No Transportation Needs   Lack of Transportation (Medical): No   Lack of Transportation (Non-Medical): No  Physical Activity: Inactive   Days of Exercise per Week: 0 days   Minutes of Exercise per Session: 30 min  Stress: No Stress Concern Present   Feeling of Stress : Not at all  Social Connections: Unknown   Frequency of Communication with Friends and Family: Not on file   Frequency of Social Gatherings with Friends and Family: Not on file   Attends Religious Services: Not on Scientist, clinical (histocompatibility and immunogenetics) or Organizations: Not on file   Attends Banker Meetings: Not on file   Marital Status: Married    Tobacco Counseling Counseling given: Not Answered Tobacco comments: only snmoke 2- cigsper day when he smoked   Clinical Intake:  Pre-visit preparation completed: Yes        Diabetes: No  How often do you need to have someone help you when you read instructions, pamphlets, or other written materials from your doctor or pharmacy?: 1 - Never   Interpreter  Needed?: No      Activities of Daily Living In your present state of health, do you have any difficulty performing the following activities: 09/21/2020  Hearing? N  Vision? N  Difficulty concentrating or making decisions? N  Walking or climbing stairs? Y  Dressing or bathing? N  Doing errands, shopping? Y  Preparing Food and eating ? N  Using the Toilet? N  In the past six months, have you accidently leaked urine? N  Do you  have problems with loss of bowel control? N  Managing your Medications? N  Managing your Finances? N  Housekeeping or managing your Housekeeping? N  Some recent data might be hidden    Patient Care Team: Glori Luis, MD as PCP - General (Family Medicine)  Indicate any recent Medical Services you may have received from other than [facility] providers in the past year (date may be approximate).     Assessment:   This is a routine wellness examination for Taten.  I connected with Matthew Juarez today by telephone and verified that I am speaking with the correct person using two identifiers. Location patient: home Location provider: work Persons participating in the virtual visit: patient, Engineer, civil (consulting).    I discussed the limitations, risks, security and privacy concerns of performing an evaluation and management service by telephone and the availability of in person appointments. The patient expressed understanding and verbally consented to this telephonic visit.    Interactive audio and video telecommunications were attempted between this provider and patient, however failed, due to patient having technical difficulties OR patient did not have access to video capability.  We continued and completed visit with audio only.  Some vital signs may be absent or patient reported.   Dietary issues and exercise activities discussed: Current Exercise Habits: Home exercise routine, Intensity: Mild   Goals Addressed               This Visit's Progress     Patient  Stated     Weight (lb) < 250 lb (113.4 kg) (pt-stated)   298 lb (135.2 kg)     Stay active with daily chair exercise        Depression Screen PHQ 2/9 Scores 09/21/2020 05/23/2020 11/28/2019 09/21/2019 07/07/2019 04/19/2019 12/22/2018  PHQ - 2 Score 0 0 0 0 0 0 0  PHQ- 9 Score - - - - - - -    Fall Risk Fall Risk  09/21/2020 05/23/2020 11/28/2019 09/21/2019 07/07/2019  Falls in the past year? 0 0 0 0 0  Number falls in past yr: 0 0 0 0 0  Injury with Fall? 0 - - - -  Follow up Falls evaluation completed Falls evaluation completed Falls evaluation completed Falls evaluation completed Falls evaluation completed    FALL RISK PREVENTION PERTAINING TO THE HOME: Avoids stairs. Home free of loose throw rugs in walkways, pet beds, electrical cords, etc? Yes  Adequate lighting in your home to reduce risk of falls? Yes   ASSISTIVE DEVICES UTILIZED TO PREVENT FALLS: Use of a cane, walker or w/c? Yes   TIMED UP AND GO: Was the test performed? No .   Cognitive Function: MMSE - Mini Mental State Exam 09/09/2017 08/21/2016  Orientation to time 5 5  Orientation to Place 5 5  Registration 3 3  Attention/ Calculation 5 5  Recall 3 3  Language- name 2 objects 2 2  Language- repeat 1 1  Language- follow 3 step command 3 3  Language- read & follow direction 1 1  Write a sentence 1 1  Copy design 1 1  Total score 30 30     6CIT Screen 09/21/2020 09/21/2019 09/20/2018  What Year? 0 points 0 points 0 points  What month? 0 points 0 points 0 points  What time? 0 points - 0 points  Count back from 20 - - 0 points  Months in reverse 0 points 0 points 0 points  Repeat phrase - 0 points -    Immunizations Immunization  History  Administered Date(s) Administered   Hepatitis A, Adult 11/12/2017, 11/09/2018   Influenza Split 01/30/2012   Influenza,inj,Quad PF,6+ Mos 12/29/2013, 01/16/2016, 04/27/2017, 02/09/2018, 12/22/2018, 05/23/2020   Influenza-Unspecified 02/19/2012, 01/01/2014, 01/16/2016, 04/27/2017,  02/09/2018   PFIZER(Purple Top)SARS-COV-2 Vaccination 06/25/2019, 07/16/2019   PPD Test 12/05/2011   Pneumococcal vaccine status: Due, Education has been provided regarding the importance of this vaccine. Advised may receive this vaccine at local pharmacy or Health Dept. Aware to provide a copy of the vaccination record if obtained from local pharmacy or Health Dept. Verbalized acceptance and understanding. Deferred.  Cologuard-deferred per patient at this time.  Covid vaccine- 1st booster completed.   Health Maintenance Health Maintenance  Topic Date Due   COVID-19 Vaccine (3 - Pfizer risk series) 10/07/2020 (Originally 08/13/2019)   Zoster Vaccines- Shingrix (1 of 2) 12/22/2020 (Originally 10/31/1977)   Pneumococcal Vaccine 490-62 Years old (1 - PCV) 09/21/2021 (Originally [birth year]31/1964)   Fecal DNA (Cologuard)  09/21/2021 (Originally 10/31/2008)   INFLUENZA VACCINE  10/29/2020   TETANUS/TDAP  04/01/2021   Hepatitis C Screening  Completed   HIV Screening  Completed   HPV VACCINES  Aged Out   Lung Cancer Screening: (Low Dose CT Chest recommended if Age 55-80 years, 30 pack-year currently smoking OR have quit w/in 15years.) does not qualify.   Dental Screening: Recommended annual dental exams for proper oral hygiene. Visits every 6 months.  Community Resource Referral / Chronic Care Management: CRR required this visit?  No   CCM required this visit?  No      Plan:   Keep all routine maintenance appointments.   I have personally reviewed and noted the following in the patient's chart:   Medical and social history Use of alcohol, tobacco or illicit drugs  Current medications and supplements including opioid prescriptions. Patient is not currently taking opioid prescriptions. Functional ability and status Nutritional status Physical activity Advanced directives List of other physicians Hospitalizations, surgeries, and ER visits in previous 12 months Vitals Screenings to include  cognitive, depression, and falls Referrals and appointments  In addition, I have reviewed and discussed with patient certain preventive protocols, quality metrics, and best practice recommendations. A written personalized care plan for preventive services as well as general preventive health recommendations were provided to patient via mychart.     Ashok PallOBrien-Blaney, Royston Bekele L, LPN   [id number]/24/2022

## 2020-09-26 DIAGNOSIS — Z79891 Long term (current) use of opiate analgesic: Secondary | ICD-10-CM | POA: Diagnosis not present

## 2020-09-26 DIAGNOSIS — M25512 Pain in left shoulder: Secondary | ICD-10-CM | POA: Diagnosis not present

## 2020-09-26 DIAGNOSIS — G8929 Other chronic pain: Secondary | ICD-10-CM | POA: Diagnosis not present

## 2020-09-26 DIAGNOSIS — G894 Chronic pain syndrome: Secondary | ICD-10-CM | POA: Diagnosis not present

## 2020-10-16 DIAGNOSIS — H524 Presbyopia: Secondary | ICD-10-CM | POA: Diagnosis not present

## 2020-10-16 DIAGNOSIS — H04123 Dry eye syndrome of bilateral lacrimal glands: Secondary | ICD-10-CM | POA: Diagnosis not present

## 2020-10-16 DIAGNOSIS — H25013 Cortical age-related cataract, bilateral: Secondary | ICD-10-CM | POA: Diagnosis not present

## 2020-10-16 DIAGNOSIS — H35033 Hypertensive retinopathy, bilateral: Secondary | ICD-10-CM | POA: Diagnosis not present

## 2020-10-16 DIAGNOSIS — H2513 Age-related nuclear cataract, bilateral: Secondary | ICD-10-CM | POA: Diagnosis not present

## 2020-10-19 DIAGNOSIS — G360 Neuromyelitis optica [Devic]: Secondary | ICD-10-CM | POA: Diagnosis not present

## 2020-10-22 DIAGNOSIS — G360 Neuromyelitis optica [Devic]: Secondary | ICD-10-CM | POA: Diagnosis not present

## 2020-10-22 DIAGNOSIS — R2681 Unsteadiness on feet: Secondary | ICD-10-CM | POA: Diagnosis not present

## 2020-11-08 DIAGNOSIS — G360 Neuromyelitis optica [Devic]: Secondary | ICD-10-CM | POA: Diagnosis not present

## 2020-11-08 DIAGNOSIS — G9589 Other specified diseases of spinal cord: Secondary | ICD-10-CM | POA: Diagnosis not present

## 2020-11-12 ENCOUNTER — Other Ambulatory Visit: Payer: Self-pay

## 2020-11-12 ENCOUNTER — Ambulatory Visit (INDEPENDENT_AMBULATORY_CARE_PROVIDER_SITE_OTHER): Payer: Medicare Other | Admitting: Podiatry

## 2020-11-12 ENCOUNTER — Encounter: Payer: Self-pay | Admitting: Podiatry

## 2020-11-12 DIAGNOSIS — B351 Tinea unguium: Secondary | ICD-10-CM | POA: Diagnosis not present

## 2020-11-12 DIAGNOSIS — L309 Dermatitis, unspecified: Secondary | ICD-10-CM | POA: Diagnosis not present

## 2020-11-12 DIAGNOSIS — M2041 Other hammer toe(s) (acquired), right foot: Secondary | ICD-10-CM | POA: Diagnosis not present

## 2020-11-12 DIAGNOSIS — M2042 Other hammer toe(s) (acquired), left foot: Secondary | ICD-10-CM

## 2020-11-12 NOTE — Progress Notes (Signed)
Subjective:   Patient ID: Matthew Juarez, male   DOB: 60 y.o.   MRN: [id number]   HPI Patient presents with concerns about discoloration between his digits which gets itchy and has started up again.  Will be treated several years ago and responded very well and is given a problem and now his left toes are also elevated with history of having had digital fusion of the right digits   ROS      Objective:  Physical Exam  Neurovascular status unchanged with discoloration whiteness between the lesser digits bilateral with no proximal edema erythema and on the left I did note that there is elevation of the digits moderate rigid contracture     Assessment:  Dermatitis with possibility for fungal infection between the lesser digits both feet along with hammertoe deformity     Plan:  H&P reviewed condition and we will start him on oral antifungal therapy topical creams and sprays along with shoe gear changes and sock changes.  Also discussed digital deformities consideration for fusion this will be held off at the current time

## 2020-11-13 ENCOUNTER — Telehealth: Payer: Self-pay | Admitting: Podiatry

## 2020-11-13 NOTE — Telephone Encounter (Signed)
Patient is calling to request prescription for antibiotic. Please advise

## 2020-11-14 ENCOUNTER — Other Ambulatory Visit: Payer: Self-pay | Admitting: Podiatry

## 2020-11-14 MED ORDER — TERBINAFINE HCL 250 MG PO TABS: 250.0000 mg | ORAL_TABLET | Freq: Every day | ORAL | 0 refills | Status: DC

## 2020-11-14 NOTE — Progress Notes (Unsigned)
lam

## 2020-11-14 NOTE — Telephone Encounter (Signed)
Sent in prescription for antifungal today

## 2020-11-20 ENCOUNTER — Ambulatory Visit: Payer: Medicare Other | Admitting: Family Medicine

## 2020-11-21 ENCOUNTER — Other Ambulatory Visit: Payer: Self-pay

## 2020-11-21 ENCOUNTER — Encounter: Payer: Self-pay | Admitting: Family Medicine

## 2020-11-21 ENCOUNTER — Ambulatory Visit (INDEPENDENT_AMBULATORY_CARE_PROVIDER_SITE_OTHER): Payer: Medicare Other | Admitting: Family Medicine

## 2020-11-21 DIAGNOSIS — Z86711 Personal history of pulmonary embolism: Secondary | ICD-10-CM

## 2020-11-21 DIAGNOSIS — M1A0710 Idiopathic chronic gout, right ankle and foot, without tophus (tophi): Secondary | ICD-10-CM

## 2020-11-21 DIAGNOSIS — E6609 Other obesity due to excess calories: Secondary | ICD-10-CM

## 2020-11-21 DIAGNOSIS — Z6833 Body mass index (BMI) 33.0-33.9, adult: Secondary | ICD-10-CM | POA: Diagnosis not present

## 2020-11-21 DIAGNOSIS — E785 Hyperlipidemia, unspecified: Secondary | ICD-10-CM | POA: Diagnosis not present

## 2020-11-21 DIAGNOSIS — E66811 Obesity, class 1: Secondary | ICD-10-CM

## 2020-11-21 LAB — URIC ACID: Uric Acid, Serum: 3.7 mg/dL — ABNORMAL LOW (ref 4.0–7.8)

## 2020-11-21 LAB — HEMOGLOBIN A1C: Hgb A1c MFr Bld: 5.9 % (ref 4.6–6.5)

## 2020-11-21 NOTE — Assessment & Plan Note (Signed)
I encouraged continued healthy diet and exercise.  We will continue to monitor his weight.

## 2020-11-21 NOTE — Assessment & Plan Note (Signed)
Continue Lipitor 40 mg once daily.

## 2020-11-21 NOTE — Patient Instructions (Signed)
Nice to see you. We will get some lab work today and contact you with the results. I will see you in 6 months.

## 2020-11-21 NOTE — Assessment & Plan Note (Signed)
Continue Xarelto 20 mg once daily.

## 2020-11-21 NOTE — Assessment & Plan Note (Signed)
Asymptomatic.  Continue allopurinol 300 mg once daily.  Check uric acid.  Recent BMP reviewed.

## 2020-11-21 NOTE — Progress Notes (Signed)
[name], MD Phone: [phone]  Matthew Juarez is a 60 y.o. male who presents today for f/u.  HYPERLIPIDEMIA Symptoms Chest pain on exertion:  no   Leg claudication:   no Medications: Compliance- taking lipitor Right upper quadrant pain- no  Muscle aches- no  Gout: Patient is taking allopurinol.  No recent flares.  History of pulmonary embolism: Patient continues on Xarelto.  He has not missed any doses.  No chest pain or shortness of breath.  Obesity: The patient has been walking quite a bit now during his day-to-day life.  He is no longer using the electric wheelchair as much as he was previously.  He has been juicing as well as eating more salads and greens.  He is drinking mostly water.  He is doing exercises at home as well.  Social History   Tobacco Use  Smoking Status Former   Years: 0.50   Types: Cigarettes   Quit date: 04/01/1983   Years since quitting: 37.6  Smokeless Tobacco Never  Tobacco Comments   only snmoke 2- cigsper day when he smoked    Current Outpatient Medications on File Prior to Visit  Medication Sig Dispense Refill   acetaminophen (TYLENOL) 500 MG tablet Take 500 mg by mouth every 6 (six) hours as needed for mild pain.     allopurinol (ZYLOPRIM) 300 MG tablet TAKE 1 TABLET BY MOUTH EVERY DAY 90 tablet 3   atorvastatin (LIPITOR) 40 MG tablet TAKE 1 TABLET BY MOUTH EVERY DAY 90 tablet 3   Cholecalciferol (VITAMIN D) 2000 UNITS CAPS Take 2,000 Units by mouth daily.     Clobetasol Propionate 0.05 % lotion APPLY TO AFFECTED AREA TWICE A DAY AS NEEDED     Coenzyme Q10 (CO Q-10) 200 MG CAPS Take 1 capsule by mouth daily. 30 each 1   desonide (DESOWEN) 0.05 % cream APPLY TO AFECTED AREA TWICE A DAY AS NEEDED  3   esomeprazole (NEXIUM) 20 MG capsule Take 1 capsule (20 mg total) by mouth daily before breakfast. 90 capsule 3   Fluocinolone Acetonide Scalp 0.01 % OIL APPLY TO SCALP AT BEDTIME, WASH OUT WHEN WAKE UP  3   fluticasone (FLOVENT HFA) 110  MCG/ACT inhaler Inhale 2 puffs into the lungs in the morning and at bedtime. 1 each 12   gabapentin (NEURONTIN) 600 MG tablet Take 1 tablet (600 mg total) by mouth 2 (two) times daily. 180 tablet 1   ketoconazole (NIZORAL) 2 % cream Apply 1 application topically daily. 15 g 0   Oxycodone HCl 10 MG TABS Take by mouth.     terbinafine (LAMISIL) 250 MG tablet Take 1 tablet (250 mg total) by mouth daily. 45 tablet 0   XARELTO 20 MG TABS tablet TAKE 1 TABLET (20 MG TOTAL) BY MOUTH DAILY WITH SUPPER. 90 tablet 3   No current facility-administered medications on file prior to visit.     ROS see history of present illness  Objective  Physical Exam Vitals:   11/21/20 1342  BP: 130/80  Pulse: 81  Temp: 98.6 F (37 C)  SpO2: 97%    BP Readings from Last 3 Encounters:  11/21/20 130/80  05/23/20 118/80  03/21/20 135/84   Wt Readings from Last 3 Encounters:  11/21/20 (!) 305 lb 3.2 oz (138.4 kg)  09/21/20 298 lb (135.2 kg)  05/23/20 298 lb 9.6 oz (135.4 kg)    Physical Exam Constitutional:      General: He is not in acute distress.  Appearance: He is not diaphoretic.  Cardiovascular:     Rate and Rhythm: Normal rate and regular rhythm.     Heart sounds: Normal heart sounds.  Pulmonary:     Effort: Pulmonary effort is normal.     Breath sounds: Normal breath sounds.  Musculoskeletal:     Comments: Compression stockings in place over his lower extremities.  Skin:    General: Skin is warm and dry.  Neurological:     Mental Status: He is alert.     Assessment/Plan: Please see individual problem list.  Problem List Items Addressed This Visit     Gout    Asymptomatic.  Continue allopurinol 300 mg once daily.  Check uric acid.  Recent BMP reviewed.      Relevant Orders   Uric acid   History of pulmonary embolism    Continue Xarelto 20 mg once daily.      Hyperlipidemia    Continue Lipitor 40 mg once daily.      Obesity    I encouraged continued healthy diet and  exercise.  We will continue to monitor his weight.      Relevant Orders   HgB A1c     Return in about 6 months (around 05/24/2021).  This visit occurred during the SARS-CoV-2 public health emergency.  Safety protocols were in place, including screening questions prior to the visit, additional usage of staff PPE, and extensive cleaning of exam room while observing appropriate contact time as indicated for disinfecting solutions.    [name], MD [facility]

## 2020-12-28 ENCOUNTER — Other Ambulatory Visit: Payer: Self-pay | Admitting: Family Medicine

## 2021-01-01 DIAGNOSIS — M25512 Pain in left shoulder: Secondary | ICD-10-CM | POA: Diagnosis not present

## 2021-01-01 DIAGNOSIS — Z79891 Long term (current) use of opiate analgesic: Secondary | ICD-10-CM | POA: Diagnosis not present

## 2021-01-01 DIAGNOSIS — M961 Postlaminectomy syndrome, not elsewhere classified: Secondary | ICD-10-CM | POA: Diagnosis not present

## 2021-01-01 DIAGNOSIS — Z5181 Encounter for therapeutic drug level monitoring: Secondary | ICD-10-CM | POA: Diagnosis not present

## 2021-01-01 DIAGNOSIS — G894 Chronic pain syndrome: Secondary | ICD-10-CM | POA: Diagnosis not present

## 2021-01-01 DIAGNOSIS — G8929 Other chronic pain: Secondary | ICD-10-CM | POA: Diagnosis not present

## 2021-01-30 ENCOUNTER — Telehealth: Payer: Self-pay | Admitting: Family Medicine

## 2021-01-30 NOTE — Telephone Encounter (Signed)
I called and spoke with the patient and he stated his symptoms started on yesterday and he tested this morning and it was negative.  Patient has not been around anyone with Covid, no chest pain, no SOB, no fever, patient has a cough and runny nose  and coughing up yellow mucous.  Mong Neal,cma

## 2021-01-30 NOTE — Telephone Encounter (Signed)
Pt called in about symptoms that he is having. Pt stated that he have a stuffy nose, and cough. Pt did a Covid at home test. Covid test came back neg. Pt stated that he is self medicating with sudafed sinus pm. Dorin Stooksbury,cma

## 2021-01-30 NOTE — Telephone Encounter (Signed)
Noted.  He may have an upper respiratory infection with a virus.  Treatment is generally supportive care.  He can try an antihistamine such as Claritin or Zyrtec to see if that helps with the runny nose.  He could try over-the-counter Mucinex to see if that would help as well.  He may have tested too early to completely rule out COVID.  He should test again at home either tomorrow or Friday.  If he is positive he needs to let us know.

## 2021-01-30 NOTE — Telephone Encounter (Signed)
I need more details on this. When did his symptoms start? When did he take the COVID test? Has he been around anyone with covid? Is he having shortness of breath, chest pain, fever >103 F? What other symptoms is he having?

## 2021-01-30 NOTE — Telephone Encounter (Signed)
I called and spoke with the patient and inforemd him that the provider stated he could try zyrtec for the runny nose and mucinex for the cough, also he needs to retest for Covid either tomorrow or Friday and let us know if he is positive and he understood.

## 2021-01-30 NOTE — Telephone Encounter (Signed)
Pt called in about symptoms that he is having. Pt stated that he have a stuffy nose, and cough. Pt did a Covid at home test. Covid test came back neg. Pt stated that he is self medicating with sudafed sinus pm. Advise pt that he will be transfer over to access nurse. Transfer Pt to access nurse.

## 2021-02-01 ENCOUNTER — Telehealth: Payer: Self-pay

## 2021-02-01 NOTE — Telephone Encounter (Signed)
Call pt Per note, he has had  neg COVID test.  Advised him that accuracy with antigen COVID test (home test) is best to take 4 to 5 days after symptom onset.  For cough I would recommend Delsym over-the-counter or Mucinex DM.  Mucinex is more helpful to break up thick mucus. It is important to stay very hydrated as this helps as well to break up thick mucus and congestion.  If you develop shortness of breath, wheezing, fever, worsening cough over the weekend, please advise that he must go in person to urgent care to be evaluated

## 2021-02-01 NOTE — Telephone Encounter (Signed)
I called and spoke with the patient and informed him that the provider stated for him to take OVC delsym or Mucinex DM and to be well hydrated and I also informed him that if he develops SOB, fever, worsening cough and wheezing to be seen in person at urgent care.  He understood.  Kimyata Milich,cma

## 2021-02-09 ENCOUNTER — Other Ambulatory Visit: Payer: Self-pay | Admitting: Family Medicine

## 2021-03-21 DIAGNOSIS — G360 Neuromyelitis optica [Devic]: Secondary | ICD-10-CM | POA: Diagnosis not present

## 2021-03-26 DIAGNOSIS — G894 Chronic pain syndrome: Secondary | ICD-10-CM | POA: Diagnosis not present

## 2021-03-26 DIAGNOSIS — Z79891 Long term (current) use of opiate analgesic: Secondary | ICD-10-CM | POA: Diagnosis not present

## 2021-03-26 DIAGNOSIS — G373 Acute transverse myelitis in demyelinating disease of central nervous system: Secondary | ICD-10-CM | POA: Diagnosis not present

## 2021-03-26 DIAGNOSIS — G8929 Other chronic pain: Secondary | ICD-10-CM | POA: Diagnosis not present

## 2021-03-26 DIAGNOSIS — Z5181 Encounter for therapeutic drug level monitoring: Secondary | ICD-10-CM | POA: Diagnosis not present

## 2021-03-26 DIAGNOSIS — M25512 Pain in left shoulder: Secondary | ICD-10-CM | POA: Diagnosis not present

## 2021-03-26 DIAGNOSIS — M961 Postlaminectomy syndrome, not elsewhere classified: Secondary | ICD-10-CM | POA: Diagnosis not present

## 2021-05-24 ENCOUNTER — Ambulatory Visit: Payer: Medicare Other | Admitting: Family Medicine

## 2021-06-26 ENCOUNTER — Other Ambulatory Visit: Payer: Self-pay

## 2021-06-26 ENCOUNTER — Ambulatory Visit (INDEPENDENT_AMBULATORY_CARE_PROVIDER_SITE_OTHER): Payer: Medicare PPO | Admitting: Family Medicine

## 2021-06-26 ENCOUNTER — Encounter: Payer: Self-pay | Admitting: Family Medicine

## 2021-06-26 VITALS — BP 110/70 | HR 67 | Temp 98.7°F | Ht 77.0 in | Wt 299.4 lb

## 2021-06-26 DIAGNOSIS — G360 Neuromyelitis optica [Devic]: Secondary | ICD-10-CM

## 2021-06-26 DIAGNOSIS — E785 Hyperlipidemia, unspecified: Secondary | ICD-10-CM | POA: Diagnosis not present

## 2021-06-26 DIAGNOSIS — R7303 Prediabetes: Secondary | ICD-10-CM | POA: Diagnosis not present

## 2021-06-26 DIAGNOSIS — Z86711 Personal history of pulmonary embolism: Secondary | ICD-10-CM

## 2021-06-26 DIAGNOSIS — R051 Acute cough: Secondary | ICD-10-CM

## 2021-06-26 LAB — CBC
HCT: 39.6 % (ref 39.0–52.0)
Hemoglobin: 13.3 g/dL (ref 13.0–17.0)
MCHC: 33.5 g/dL (ref 30.0–36.0)
MCV: 85.7 fl (ref 78.0–100.0)
Platelets: 223 10*3/uL (ref 150.0–400.0)
RBC: 4.62 Mil/uL (ref 4.22–5.81)
RDW: 13.2 % (ref 11.5–15.5)
WBC: 3.8 10*3/uL — ABNORMAL LOW (ref 4.0–10.5)

## 2021-06-26 LAB — LIPID PANEL
Cholesterol: 120 mg/dL (ref 0–200)
HDL: 33.8 mg/dL — ABNORMAL LOW (ref >39.00)
LDL Cholesterol: 69 mg/dL (ref 0–99)
NonHDL: 86.44
Total CHOL/HDL Ratio: 4
Triglycerides: 89 mg/dL (ref 0.0–149.0)
VLDL: 17.8 mg/dL (ref 0.0–40.0)

## 2021-06-26 LAB — COMPREHENSIVE METABOLIC PANEL
ALT: 14 U/L (ref 0–53)
AST: 15 U/L (ref 0–37)
Albumin: 4.2 g/dL (ref 3.5–5.2)
Alkaline Phosphatase: 91 U/L (ref 39–117)
BUN: 12 mg/dL (ref 6–23)
CO2: 30 mEq/L (ref 19–32)
Calcium: 9.1 mg/dL (ref 8.4–10.5)
Chloride: 101 mEq/L (ref 96–112)
Creatinine, Ser: 0.85 mg/dL (ref 0.40–1.50)
GFR: 93.04 mL/min (ref >60.00)
Glucose, Bld: 84 mg/dL (ref 70–99)
Potassium: 4.1 mEq/L (ref 3.5–5.1)
Sodium: 137 mEq/L (ref 135–145)
Total Bilirubin: 1.5 mg/dL — ABNORMAL HIGH (ref 0.2–1.2)
Total Protein: 6.3 g/dL (ref 6.0–8.3)

## 2021-06-26 LAB — HEMOGLOBIN A1C: Hgb A1c MFr Bld: 6.1 % (ref 4.6–6.5)

## 2021-06-26 MED ORDER — TETANUS-DIPHTHERIA TOXOIDS TD 5-2 LFU IM INJ: 0.5000 mL | INJECTION | Freq: Once | INTRAMUSCULAR | 0 refills | Status: AC

## 2021-06-26 NOTE — Progress Notes (Signed)
?[name], MD ?Phone: [phone] ? ?Matthew Juarez is a 61 y.o. male who presents today for f/u. ? ?HYPERLIPIDEMIA ?Symptoms ?Chest pain on exertion:  no   Leg claudication:   no ?Medications: ?Compliance- taking Lipitor right upper quadrant pain- no  Muscle aches- no ?Lipid Panel  ?   ?Component Value Date/Time  ? CHOL 125 05/23/2020 1118  ? TRIG 81.0 05/23/2020 1118  ? HDL 46.50 05/23/2020 1118  ? CHOLHDL 3 05/23/2020 1118  ? VLDL 16.2 05/23/2020 1118  ? [employer] 62 05/23/2020 1118  ? LDLDIRECT 80.0 04/27/2017 1051  ? 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  He continues to follow with neurology.  He is getting infusions every 4 months. ? ?History of PE: No bleeding issues.  He is taking Xarelto.  No chest pain. ? ?Cough: Patient reports cough the last week or so.  No rhinorrhea, congestion, sore throat, loss of taste or smell, or sick contacts.  No significant recent reflux.  He does have a history of allergies.  He notes the cough has gotten somewhat better.  Robitussin is somewhat beneficial. ? ?Prediabetes: Patient notes he has cut down on fried food and sodas.  He is generally been good with his diet.  He continues to do chair exercises. ? ?Social History  ? ?Tobacco Use  ?Smoking Status Former  ? Years: 0.50  ? Types: Cigarettes  ? Quit date: 04/01/1983  ? Years since quitting: 38.2  ?Smokeless Tobacco Never  ?Tobacco Comments  ? only snmoke 2- cigsper day when he smoked  ? ? ?Current Outpatient Medications on File Prior to Visit  ?Medication Sig Dispense Refill  ? acetaminophen (TYLENOL) 500 MG tablet Take 500 mg by mouth every 6 (six) hours as needed for mild pain.    ? allopurinol (ZYLOPRIM) 300 MG tablet TAKE 1 TABLET BY MOUTH EVERY DAY 90 tablet 3  ? atorvastatin (LIPITOR) 40 MG tablet TAKE 1 TABLET BY MOUTH EVERY DAY 90 tablet 3  ? Cholecalciferol (VITAMIN D) 2000 UNITS CAPS Take 2,000 Units by mouth daily.    ? Clobetasol Propionate 0.05 % lotion APPLY TO AFFECTED AREA  TWICE A DAY AS NEEDED    ? Coenzyme Q10 (CO Q-10) 200 MG CAPS Take 1 capsule by mouth daily. 30 each 1  ? desonide (DESOWEN) 0.05 % cream APPLY TO AFECTED AREA TWICE A DAY AS NEEDED  3  ? esomeprazole (NEXIUM) 20 MG capsule Take 1 capsule (20 mg total) by mouth daily before breakfast. 90 capsule 3  ? Fluocinolone Acetonide Scalp 0.01 % OIL APPLY TO SCALP AT BEDTIME, WASH OUT WHEN WAKE UP  3  ? gabapentin (NEURONTIN) 600 MG tablet Take 1 tablet (600 mg total) by mouth 2 (two) times daily. 180 tablet 1  ? ketoconazole (NIZORAL) 2 % cream Apply 1 application topically daily. 15 g 0  ? Oxycodone HCl 10 MG TABS Take by mouth.    ? XARELTO 20 MG TABS tablet TAKE 1 TABLET BY MOUTH DAILY WITH SUPPER. 90 tablet 3  ? fluticasone (FLOVENT HFA) 110 MCG/ACT inhaler Inhale 2 puffs into the lungs in the morning and at bedtime. 1 each 12  ? ?No current facility-administered medications on file prior to visit.  ? ? ? ?ROS see history of present illness ? ?Objective ? ?Physical Exam ?Vitals:  ? 06/26/21 0940  ?BP: 110/70  ?Pulse: 67  ?Temp: 98.7 ?F (37.1 ?C)  ?SpO2: 95%  ? ? ?BP Readings from Last 3 Encounters:  ?06/26/21 110/70  ?  11/21/20 130/80  ?05/23/20 118/80  ? ?Wt Readings from Last 3 Encounters:  ?06/26/21 299 lb 6.4 oz (135.8 kg)  ?11/21/20 (!) 305 lb 3.2 oz (138.4 kg)  ?09/21/20 298 lb (135.2 kg)  ? ? ?Physical Exam ?Constitutional:   ?   General: He is not in acute distress. ?   Appearance: He is not diaphoretic.  ?Cardiovascular:  ?   Rate and Rhythm: Normal rate and regular rhythm.  ?   Heart sounds: Normal heart sounds.  ?Pulmonary:  ?   Effort: Pulmonary effort is normal.  ?   Breath sounds: Normal breath sounds.  ?Skin: ?   General: Skin is warm and dry.  ?Neurological:  ?   Mental Status: He is alert.  ? ? ? ?Assessment/Plan: Please see individual problem list. ? ?Problem List Items Addressed This Visit   ? ? Cough  ?  Recent onset cough that could be related to a viral illness or allergies given his history of  allergies.  Discussed if is a viral illness that should improve over the next few days.  Advised given the duration of his symptoms and that he has been improving there is no reason to test for COVID.  He will wear a mask when he is around anybody.  He we will trial Claritin or Zyrtec over-the-counter to see if that will help.  If he is not improving by Monday he will contact us.  If he has any worsening or new symptoms he will contact us. ?  ?  ? History of pulmonary embolism  ?  Continue Xarelto 20 mg once daily.  Check CBC. ?  ?  ? Relevant Orders  ? CBC  ? Hyperlipidemia  ?  Continue Lipitor 40 mg once daily.  Check lipid panel and CMP. ?  ?  ? Relevant Orders  ? Comp Met (CMET)  ? Lipid panel  ? Neuromyelitis optica ([place]) - Primary  ?  Stable.  He will continue to see neurology. ?  ?  ? Prediabetes  ?  Check A1c.  Continue diet and exercise changes. ?  ?  ? Relevant Orders  ? HgB A1c  ? ? ? ?Health Maintenance: The patient reports that he has had 2 shingles vaccines.  He is due for a tetanus vaccine. ? ?Return in about 6 months (around 12/27/2021). ? ?This visit occurred during the SARS-CoV-2 public health emergency.  Safety protocols were in place, including screening questions prior to the visit, additional usage of staff PPE, and extensive cleaning of exam room while observing appropriate contact time as indicated for disinfecting solutions.  ? ? ?[name], MD ?[place] ? ?

## 2021-06-26 NOTE — Assessment & Plan Note (Addendum)
Recent onset cough that could be related to a viral illness or allergies given his history of allergies.  Discussed if is a viral illness that should improve over the next few days.  Advised given the duration of his symptoms and that he has been improving there is no reason to test for COVID.  He will wear a mask when he is around anybody.  He we will trial Claritin or Zyrtec over-the-counter to see if that will help.  If he is not improving by Monday he will contact us.  If he has any worsening or new symptoms he will contact us. ?

## 2021-06-26 NOTE — Assessment & Plan Note (Signed)
Stable.  He will continue to see neurology. 

## 2021-06-26 NOTE — Assessment & Plan Note (Signed)
Continue Lipitor 40 mg once daily.  Check lipid panel and CMP. 

## 2021-06-26 NOTE — Assessment & Plan Note (Signed)
Continue Xarelto 20 mg once daily.  Check CBC. ?

## 2021-06-26 NOTE — Assessment & Plan Note (Addendum)
Check A1c.  Continue diet and exercise changes. 

## 2021-06-26 NOTE — Patient Instructions (Signed)
Nice to see you. ?Please try adding on claritin or zyrtec over the counter to see if that will help with your cough. If you are not improving by Monday please let me know.  ?If you develop worsening symptoms or new symptoms please let us know right away.  ?You can get the tetanus shot at the pharmacy.  ?We will call with your lab results.  ?

## 2021-06-28 ENCOUNTER — Other Ambulatory Visit: Payer: Self-pay

## 2021-06-28 DIAGNOSIS — D72819 Decreased white blood cell count, unspecified: Secondary | ICD-10-CM

## 2021-07-17 ENCOUNTER — Other Ambulatory Visit (INDEPENDENT_AMBULATORY_CARE_PROVIDER_SITE_OTHER): Payer: Medicare PPO

## 2021-07-17 DIAGNOSIS — D72819 Decreased white blood cell count, unspecified: Secondary | ICD-10-CM

## 2021-07-17 LAB — CBC WITH DIFFERENTIAL/PLATELET
Basophils Absolute: 0 10*3/uL (ref 0.0–0.1)
Basophils Relative: 0.5 % (ref 0.0–3.0)
Eosinophils Absolute: 0.2 10*3/uL (ref 0.0–0.7)
Eosinophils Relative: 4.6 % (ref 0.0–5.0)
HCT: 37.4 % — ABNORMAL LOW (ref 39.0–52.0)
Hemoglobin: 12.7 g/dL — ABNORMAL LOW (ref 13.0–17.0)
Lymphocytes Relative: 21.7 % (ref 12.0–46.0)
Lymphs Abs: 1.2 10*3/uL (ref 0.7–4.0)
MCHC: 33.9 g/dL (ref 30.0–36.0)
MCV: 86.3 fl (ref 78.0–100.0)
Monocytes Absolute: 0.4 10*3/uL (ref 0.1–1.0)
Monocytes Relative: 7.9 % (ref 3.0–12.0)
Neutro Abs: 3.5 10*3/uL (ref 1.4–7.7)
Neutrophils Relative %: 65.3 % (ref 43.0–77.0)
Platelets: 216 10*3/uL (ref 150.0–400.0)
RBC: 4.34 Mil/uL (ref 4.22–5.81)
RDW: 13.4 % (ref 11.5–15.5)
WBC: 5.3 10*3/uL (ref 4.0–10.5)

## 2021-07-18 ENCOUNTER — Other Ambulatory Visit: Payer: Self-pay | Admitting: Family Medicine

## 2021-07-18 DIAGNOSIS — D649 Anemia, unspecified: Secondary | ICD-10-CM

## 2021-07-29 DIAGNOSIS — G360 Neuromyelitis optica [Devic]: Secondary | ICD-10-CM | POA: Diagnosis not present

## 2021-08-01 DIAGNOSIS — G360 Neuromyelitis optica [Devic]: Secondary | ICD-10-CM | POA: Diagnosis not present

## 2021-08-01 DIAGNOSIS — G894 Chronic pain syndrome: Secondary | ICD-10-CM | POA: Diagnosis not present

## 2021-08-01 DIAGNOSIS — Z79891 Long term (current) use of opiate analgesic: Secondary | ICD-10-CM | POA: Diagnosis not present

## 2021-08-01 DIAGNOSIS — Z5181 Encounter for therapeutic drug level monitoring: Secondary | ICD-10-CM | POA: Diagnosis not present

## 2021-08-06 ENCOUNTER — Other Ambulatory Visit (INDEPENDENT_AMBULATORY_CARE_PROVIDER_SITE_OTHER): Payer: Medicare PPO

## 2021-08-06 DIAGNOSIS — D649 Anemia, unspecified: Secondary | ICD-10-CM

## 2021-08-06 LAB — IBC + FERRITIN
Ferritin: 159.2 ng/mL (ref 22.0–322.0)
Iron: 79 ug/dL (ref 42–165)
Saturation Ratios: 27.4 % (ref 20.0–50.0)
TIBC: 288.4 ug/dL (ref 250.0–450.0)
Transferrin: 206 mg/dL — ABNORMAL LOW (ref 212.0–360.0)

## 2021-08-06 LAB — CBC
HCT: 39.7 % (ref 39.0–52.0)
Hemoglobin: 13.1 g/dL (ref 13.0–17.0)
MCHC: 33 g/dL (ref 30.0–36.0)
MCV: 87.6 fl (ref 78.0–100.0)
Platelets: 167 10*3/uL (ref 150.0–400.0)
RBC: 4.53 Mil/uL (ref 4.22–5.81)
RDW: 14.3 % (ref 11.5–15.5)
WBC: 4.3 10*3/uL (ref 4.0–10.5)

## 2021-08-20 ENCOUNTER — Telehealth: Payer: Self-pay | Admitting: Cardiovascular Disease

## 2021-08-20 NOTE — Telephone Encounter (Signed)
3 attempts to schedule fu appt from recall list.   Deleting recall.   

## 2021-09-27 ENCOUNTER — Encounter: Payer: Self-pay | Admitting: Nurse Practitioner

## 2021-09-27 ENCOUNTER — Ambulatory Visit (INDEPENDENT_AMBULATORY_CARE_PROVIDER_SITE_OTHER): Payer: Medicare PPO

## 2021-09-27 ENCOUNTER — Ambulatory Visit (INDEPENDENT_AMBULATORY_CARE_PROVIDER_SITE_OTHER): Payer: Medicare PPO | Admitting: Nurse Practitioner

## 2021-09-27 ENCOUNTER — Telehealth: Payer: Self-pay

## 2021-09-27 ENCOUNTER — Ambulatory Visit: Payer: Medicare PPO

## 2021-09-27 VITALS — BP 118/84 | Ht 77.0 in | Wt 299.0 lb

## 2021-09-27 VITALS — BP 118/84 | HR 79 | Temp 97.6°F | Resp 16

## 2021-09-27 DIAGNOSIS — M7989 Other specified soft tissue disorders: Secondary | ICD-10-CM | POA: Insufficient documentation

## 2021-09-27 DIAGNOSIS — R600 Localized edema: Secondary | ICD-10-CM | POA: Insufficient documentation

## 2021-09-27 DIAGNOSIS — Z0000 Encounter for general adult medical examination without abnormal findings: Secondary | ICD-10-CM | POA: Diagnosis not present

## 2021-09-27 DIAGNOSIS — L03119 Cellulitis of unspecified part of limb: Secondary | ICD-10-CM | POA: Diagnosis not present

## 2021-09-27 LAB — CBC
HCT: 42.8 % (ref 39.0–52.0)
Hemoglobin: 13.9 g/dL (ref 13.0–17.0)
MCHC: 32.5 g/dL (ref 30.0–36.0)
MCV: 89.1 fl (ref 78.0–100.0)
Platelets: 190 10*3/uL (ref 150.0–400.0)
RBC: 4.8 Mil/uL (ref 4.22–5.81)
RDW: 13.9 % (ref 11.5–15.5)
WBC: 8.4 10*3/uL (ref 4.0–10.5)

## 2021-09-27 LAB — BRAIN NATRIURETIC PEPTIDE: Pro B Natriuretic peptide (BNP): 19 pg/mL (ref 0.0–100.0)

## 2021-09-27 MED ORDER — CEFTRIAXONE SODIUM 1 G IJ SOLR
1.0000 g | Freq: Once | INTRAMUSCULAR | Status: AC
  Administered 2021-09-27: 1 g via INTRAMUSCULAR

## 2021-09-27 MED ORDER — DOXYCYCLINE HYCLATE 100 MG PO TABS: 100.0000 mg | ORAL_TABLET | Freq: Two times a day (BID) | ORAL | 0 refills | Status: AC

## 2021-09-27 NOTE — Patient Instructions (Addendum)
Matthew Juarez , Thank you for taking time to come for your Medicare Wellness Visit. I appreciate your ongoing commitment to your health goals. Please review the following plan we discussed and let me know if I can assist you in the future.   These are the goals we discussed:  Goals       Patient Stated     Weight (lb) < 250 lb (113.4 kg) (pt-stated)      Stay active with daily chair exercises        This is a list of the screening recommended for you and due dates:  Health Maintenance  Topic Date Due   COVID-19 Vaccine (3 - Pfizer risk series) 10/13/2021*   Cologuard (Stool DNA test)  11/29/2021*   Tetanus Vaccine  09/28/2022*   Flu Shot  10/29/2021   Hepatitis C Screening: USPSTF Recommendation to screen - Ages 18-79 yo.  Completed   HIV Screening  Completed   Zoster (Shingles) Vaccine  Completed   HPV Vaccine  Aged Out   Colon Cancer Screening  Discontinued  *Topic was postponed. The date shown is not the original due date.   Opioid Pain Medicine Management Opioids are powerful medicines that are used to treat moderate to severe pain. When used for short periods of time, they can help you to: Sleep better. Do better in physical or occupational therapy. Feel better in the first few days after an injury. Recover from surgery. Opioids should be taken with the supervision of a trained health care provider. They should be taken for the shortest period of time possible. This is because opioids can be addictive, and the longer you take opioids, the greater your risk of addiction. This addiction can also be called opioid use disorder. What are the risks? Using opioid pain medicines for longer than 3 days increases your risk of side effects. Side effects include: Constipation. Nausea and vomiting. Breathing difficulties (respiratory depression). Drowsiness. Confusion. Opioid use disorder. Itching. Taking opioid pain medicine for a long period of time can affect your ability to do  daily tasks. It also puts you at risk for: Motor vehicle crashes. Depression. Suicide. Heart attack. Overdose, which can be life-threatening. What is a pain treatment plan? A pain treatment plan is an agreement between you and your health care provider. Pain is unique to each person, and treatments vary depending on your condition. To manage your pain, you and your health care provider need to work together. To help you do this: Discuss the goals of your treatment, including how much pain you might expect to have and how you will manage the pain. Review the risks and benefits of taking opioid medicines. Remember that a good treatment plan uses more than one approach and minimizes the chance of side effects. Be honest about the amount of medicines you take and about any drug or alcohol use. Get pain medicine prescriptions from only one health care provider. Pain can be managed with many types of alternative treatments. Ask your health care provider to refer you to one or more specialists who can help you manage pain through: Physical or occupational therapy. Counseling (cognitive behavioral therapy). Good nutrition. Biofeedback. Massage. Meditation. Non-opioid medicine. Following a gentle exercise program. How to use opioid pain medicine Taking medicine Take your pain medicine exactly as told by your health care provider. Take it only when you need it. If your pain gets less severe, you may take less than your prescribed dose if your health care provider approves. If  you are not having pain, do nottake pain medicine unless your health care provider tells you to take it. If your pain is severe, do nottry to treat it yourself by taking more pills than instructed on your prescription. Contact your health care provider for help. Write down the times when you take your pain medicine. It is easy to become confused while on pain medicine. Writing the time can help you avoid overdose. Take other  over-the-counter or prescription medicines only as told by your health care provider. Keeping yourself and others safe  While you are taking opioid pain medicine: Do not drive, use machinery, or power tools. Do not sign legal documents. Do not drink alcohol. Do not take sleeping pills. Do not supervise children by yourself. Do not do activities that require climbing or being in high places. Do not go to a lake, river, ocean, spa, or swimming pool. Do not share your pain medicine with anyone. Keep pain medicine in a locked cabinet or in a secure area where pets and children cannot reach it. Stopping your use of opioids If you have been taking opioid medicine for more than a few weeks, you may need to slowly decrease (taper) how much you take until you stop completely. Tapering your use of opioids can decrease your risk of symptoms of withdrawal, such as: Pain and cramping in the abdomen. Nausea. Sweating. Sleepiness. Restlessness. Uncontrollable shaking (tremors). Cravings for the medicine. Do not attempt to taper your use of opioids on your own. Talk with your health care provider about how to do this. Your health care provider may prescribe a step-down schedule based on how much medicine you are taking and how long you have been taking it. Getting rid of leftover pills Do not save any leftover pills. Get rid of leftover pills safely by: Taking the medicine to a prescription take-back program. This is usually offered by the county or law enforcement. Bringing them to a pharmacy that has a drug disposal container. Flushing them down the toilet. Check the label or package insert of your medicine to see whether this is safe to do. Throwing them out in the trash. Check the label or package insert of your medicine to see whether this is safe to do. If it is safe to throw it out, remove the medicine from the original container, put it into a sealable bag or container, and mix it with used  coffee grounds, food scraps, dirt, or cat litter before putting it in the trash. Follow these instructions at home: Activity Do exercises as told by your health care provider. Avoid activities that make your pain worse. Return to your normal activities as told by your health care provider. Ask your health care provider what activities are safe for you. General instructions You may need to take these actions to prevent or treat constipation: Drink enough fluid to keep your urine pale yellow. Take over-the-counter or prescription medicines. Eat foods that are high in fiber, such as beans, whole grains, and fresh fruits and vegetables. Limit foods that are high in fat and processed sugars, such as fried or sweet foods. Keep all follow-up visits. This is important. Where to find support If you have been taking opioids for a long time, you may benefit from receiving support for quitting from a local support group or counselor. Ask your health care provider for a referral to these resources in your area. Where to find more information Centers for Disease Control and Prevention (CDC): http://www.wolf.info/ [place] Food  and Drug Administration (FDA): PumpkinSearch.com.ee Get help right away if: You may have taken too much of an opioid (overdosed). Common symptoms of an overdose: Your breathing is slower or more shallow than normal. You have a very slow heartbeat (pulse). You have slurred speech. You have nausea and vomiting. Your pupils become very small. You have other potential symptoms: You are very confused. You faint or feel like you will faint. You have cold, clammy skin. You have blue lips or fingernails. You have thoughts of harming yourself or harming others. These symptoms may represent a serious problem that is an emergency. Do not wait to see if the symptoms will go away. Get medical help right away. Call your local emergency services (911 in the [place] Do not drive yourself to the hospital.  If you  ever feel like you may hurt yourself or others, or have thoughts about taking your own life, get help right away. Go to your nearest emergency department or: Call your local emergency services (911 in the [place] Call the [facility] ((804)477-2632 in the [place] Call a suicide crisis helpline, such as the National Suicide Prevention Lifeline at 2084599570 or 988 in the [place] This is open 24 hours a day in the [place] Text the Crisis Text Line at 6200300100 (in the [place] Summary Opioid medicines can help you manage moderate to severe pain for a short period of time. A pain treatment plan is an agreement between you and your health care provider. Discuss the goals of your treatment, including how much pain you might expect to have and how you will manage the pain. If you think that you or someone else may have taken too much of an opioid, get medical help right away. This information is not intended to replace advice given to you by your health care provider. Make sure you discuss any questions you have with your health care provider. Document Revised: 10/10/2020 Document Reviewed: 06/27/2020 Elsevier Patient Education  2023 [employer].

## 2021-09-27 NOTE — Assessment & Plan Note (Signed)
Likely secondary to infectious process.  Patient does generally wear compression stockings to the day did encourage patient to not wear those if possible and to keep his feet elevated is much as possible.  Also informed them to clean feet with just plain soap and water did give strict instructions when to be seen over the weekend.  We will have close follow-up pending CBC and BNP because patient has history of right heart failure

## 2021-09-27 NOTE — Progress Notes (Signed)
Subjective:   Matthew Juarez is a 61 y.o. male who presents for Medicare Annual/Subsequent preventive examination.  Review of Systems    No ROS.  Medicare Wellness Virtual Visit.  Visual/audio telehealth visit, UTA vital signs.   See social history for additional risk factors.   Cardiac Risk Factors include: advanced age (>68men, >53 women);male gender     Objective:    Today's Vitals   09/27/21 1532  BP: 118/84  Weight: 299 lb (135.6 kg)  Height: 6\' 5"  (1.956 m)   Body mass index is 35.46 kg/m.     09/27/2021    3:34 PM 09/21/2020    9:20 AM 09/21/2019    9:08 AM 07/17/2019   12:22 AM 04/28/2019    5:46 PM 09/20/2018    9:28 AM 09/09/2017   10:29 AM  Advanced Directives  Does Patient Have a Medical Advance Directive? No No No No No No No  Would patient like information on creating a medical advance directive? No - Patient declined No - Patient declined No - Patient declined  No - Patient declined No - Patient declined Yes (MAU/Ambulatory/Procedural Areas - Information given)    Current Medications (verified) Outpatient Encounter Medications as of 09/27/2021  Medication Sig   acetaminophen (TYLENOL) 500 MG tablet Take 500 mg by mouth every 6 (six) hours as needed for mild pain.   allopurinol (ZYLOPRIM) 300 MG tablet TAKE 1 TABLET BY MOUTH EVERY DAY   atorvastatin (LIPITOR) 40 MG tablet TAKE 1 TABLET BY MOUTH EVERY DAY   Cholecalciferol (VITAMIN D) 2000 UNITS CAPS Take 2,000 Units by mouth daily.   Clobetasol Propionate 0.05 % lotion APPLY TO AFFECTED AREA TWICE A DAY AS NEEDED   Coenzyme Q10 (CO Q-10) 200 MG CAPS Take 1 capsule by mouth daily.   desonide (DESOWEN) 0.05 % cream APPLY TO AFECTED AREA TWICE A DAY AS NEEDED   doxycycline (VIBRA-TABS) 100 MG tablet Take 1 tablet (100 mg total) by mouth 2 (two) times daily for 7 days.   esomeprazole (NEXIUM) 20 MG capsule Take 1 capsule (20 mg total) by mouth daily before breakfast.   Fluocinolone Acetonide Scalp 0.01 % OIL  APPLY TO SCALP AT BEDTIME, WASH OUT WHEN WAKE UP   gabapentin (NEURONTIN) 600 MG tablet Take 1 tablet (600 mg total) by mouth 2 (two) times daily.   ketoconazole (NIZORAL) 2 % cream Apply 1 application topically daily.   Oxycodone HCl 10 MG TABS Take by mouth.   XARELTO 20 MG TABS tablet TAKE 1 TABLET BY MOUTH DAILY WITH SUPPER.   [EXPIRED] cefTRIAXone (ROCEPHIN) injection 1 g    No facility-administered encounter medications on file as of 09/27/2021.    Allergies (verified) Patient has no known allergies.   History: Past Medical History:  Diagnosis Date   Arthritis    Cardiac arrest ([place]) 06/23/2012   Chronic back pain    COVID-19 04/19/2019   Gout    Hammer toe    Headache(784.0)    History of blood clots    Hypertension    Pulmonary embolism ([place])    Pulmonary infarction ([place]) 07/23/2012   Overview:  Last Assessment & Plan:  With associated R heart failure and cardiac arrest.  - you could make an argument to treat him with lifelong coumadin given the severity of his PE, although the literature supports 12 months.  - he will be treated (at least) 12 months, to be followed by Dr Karlton Lemon.  - will need hypercoag w/u a month after  the coumadin is stopped - restart coumadin if at any incr   Transverse myelitis [facility])    Past Surgical History:  Procedure Laterality Date   HAMMER TOE SURGERY     2016   Family History  Problem Relation Age of Onset   Dementia Mother    Lung cancer Maternal Uncle    Colon cancer Maternal Aunt    Lung cancer Maternal Aunt    Heart disease Neg Hx    Social History   Socioeconomic History   Marital status: Married    Spouse name: Not on file   Number of children: Not on file   Years of education: Not on file   Highest education level: Not on file  Occupational History   Occupation: disability  Tobacco Use   Smoking status: Former    Years: 0.50    Types: Cigarettes    Quit date: 04/01/1983    Years since quitting: 38.5   Smokeless tobacco:  Never   Tobacco comments:    only smoke 2- cigs per day when he smoked  Vaping Use   Vaping Use: Never used  Substance and Sexual Activity   Alcohol use: No    Alcohol/week: 0.0 standard drinks of alcohol   Drug use: No   Sexual activity: Not Currently  Other Topics Concern   Not on file  Social History Narrative   Not on file   Social Determinants of Health   Financial Resource Strain: Low Risk  (09/27/2021)   Overall Financial Resource Strain (CARDIA)    Difficulty of Paying Living Expenses: Not hard at all  Food Insecurity: No Food Insecurity (09/27/2021)   Hunger Vital Sign    Worried About Running Out of Food in the Last Year: Never true    Ran Out of Food in the Last Year: Never true  Transportation Needs: No Transportation Needs (09/27/2021)   PRAPARE - Hydrologist (Medical): No    Lack of Transportation (Non-Medical): No  Physical Activity: Inactive (09/21/2020)   Exercise Vital Sign    Days of Exercise per Week: 0 days    Minutes of Exercise per Session: 30 min  Stress: No Stress Concern Present (09/27/2021)   [place]    Feeling of Stress : Not at all  Social Connections: Unknown (09/27/2021)   Social Connection and Isolation Panel [NHANES]    Frequency of Communication with Friends and Family: Not on file    Frequency of Social Gatherings with Friends and Family: Not on file    Attends Religious Services: Not on file    Active Member of Clubs or Organizations: Not on file    Attends Archivist Meetings: Not on file    Marital Status: Married    Tobacco Counseling Counseling given: Not Answered Tobacco comments: only smoke 2- cigs per day when he smoked   Clinical Intake:  Pre-visit preparation completed: Yes        Diabetes: No  How often do you need to have someone help you when you read instructions, pamphlets, or other written materials from  your doctor or pharmacy?: 1 - Never   Interpreter Needed?: No      Activities of Daily Living    09/27/2021    3:36 PM  In your present state of health, do you have any difficulty performing the following activities:  Hearing? 0  Vision? 0  Difficulty concentrating or making decisions? 0  Walking  or climbing stairs? 1  Comment Wheelchair in use.  Dressing or bathing? 0  Doing errands, shopping? 1  Comment Family assist as needed  Preparing Food and eating ? N  Comment Family assist as needed with meal prep. Self feeds.  Using the Toilet? N  In the past six months, have you accidently leaked urine? N  Do you have problems with loss of bowel control? N  Managing your Medications? N  Managing your Finances? N  Housekeeping or managing your Housekeeping? Y  Comment Family assist as needed    Patient Care Team: Glori Luis, MD as PCP - General (Family Medicine)  Indicate any recent Medical Services you may have received from other than [facility] providers in the past year (date may be approximate).     Assessment:   This is a routine wellness examination for Matthew Juarez.  Virtual Visit via Telephone Note  I connected with  Matthew Juarez on 09/27/21 at  3:30 PM EDT by telephone and verified that I am speaking with the correct person using two identifiers.  Persons participating in the virtual visit: patient/Nurse Health Advisor   I discussed the limitations of performing an evaluation and management service by telehealth. We continued and completed visit with audio only. Some vital signs may be absent or patient reported.   Hearing/Vision screen Hearing Screening - Comments:: Patient is able to hear conversational tones without difficulty.  No issues reported. Vision Screening - Comments:: Recommended annual ophthalmology exams for early detection of glaucoma and other disorders of the eye. Wears reader lenses They have seen their ophthalmologist in the last 12 months.  Followed by Dr. Shirlyn Goltz.   Dietary issues and exercise activities discussed:   Regular diet Good water   Goals Addressed               This Visit's Progress     Patient Stated     Weight (lb) < 250 lb (113.4 kg) (pt-stated)        Stay active with daily chair exercises       Depression Screen    09/27/2021    3:35 PM 11/21/2020    1:44 PM 09/21/2020    9:09 AM 05/23/2020   10:45 AM 11/28/2019   10:36 AM 09/21/2019    9:09 AM 07/07/2019   11:08 AM  PHQ 2/9 Scores  PHQ - 2 Score 0 0 0 0 0 0 0    Fall Risk    09/27/2021    3:35 PM 11/21/2020    1:44 PM 09/21/2020    9:21 AM 05/23/2020   10:45 AM 11/28/2019   10:36 AM  Fall Risk   Falls in the past year? 0 0 0 0 0  Number falls in past yr:  0 0 0 0  Injury with Fall?  0 0    Follow up Falls evaluation completed Falls evaluation completed Falls evaluation completed Falls evaluation completed Falls evaluation completed    FALL RISK PREVENTION PERTAINING TO THE HOME: Home free of loose throw rugs in walkways, pet beds, electrical cords, etc? Yes  Adequate lighting in your home to reduce risk of falls? Yes   ASSISTIVE DEVICES UTILIZED TO PREVENT FALLS: Use of a cane, walker or w/c? Yes   TIMED UP AND GO: Was the test performed? No .   Cognitive Function: Patient is alert and oriented x3.      09/09/2017   10:54 AM 08/21/2016   10:26 AM  MMSE - Mini Mental State  Exam  Orientation to time 5 5  Orientation to Place 5 5  Registration 3 3  Attention/ Calculation 5 5  Recall 3 3  Language- name 2 objects 2 2  Language- repeat 1 1  Language- follow 3 step command 3 3  Language- read & follow direction 1 1  Write a sentence 1 1  Copy design 1 1  Total score 30 30        09/21/2020    9:15 AM 09/21/2019    9:14 AM 09/20/2018    9:30 AM  6CIT Screen  What Year? 0 points 0 points 0 points  What month? 0 points 0 points 0 points  What time? 0 points  0 points  Count back from 20   0 points  Months in reverse 0  points 0 points 0 points  Repeat phrase  0 points     Immunizations Immunization History  Administered Date(s) Administered   Hepatitis A, Adult 11/12/2017, 11/09/2018   Influenza Split 01/30/2012   Influenza,inj,Quad PF,6+ Mos 12/29/2013, 01/16/2016, 04/27/2017, 02/09/2018, 12/22/2018, 05/23/2020   Influenza-Unspecified 02/19/2012, 01/01/2014, 01/16/2016, 04/27/2017, 02/09/2018   PFIZER(Purple Top)SARS-COV-2 Vaccination 06/25/2019, 07/16/2019   PPD Test 12/05/2011   Zoster Recombinat (Shingrix) 03/13/2021, 05/15/2021    TDAP status: Due, Education has been provided regarding the importance of this vaccine. Advised may receive this vaccine at local pharmacy or Health Dept. Aware to provide a copy of the vaccination record if obtained from local pharmacy or Health Dept. Verbalized acceptance and understanding.  Screening Tests Health Maintenance  Topic Date Due   COVID-19 Vaccine (3 - Pfizer risk series) 10/13/2021 (Originally 08/13/2019)   Fecal DNA (Cologuard)  11/29/2021 (Originally 11/01/2003)   TETANUS/TDAP  09/28/2022 (Originally 04/01/2021)   INFLUENZA VACCINE  10/29/2021   Hepatitis C Screening  Completed   HIV Screening  Completed   Zoster Vaccines- Shingrix  Completed   HPV VACCINES  Aged Out   COLONOSCOPY (Pts 45-34yrs Insurance coverage will need to be confirmed)  Discontinued   Health Maintenance There are no preventive care reminders to display for this patient.  Cologuard- notes completed 2 yrs ago, negative result.   Lung Cancer Screening: (Low Dose CT Chest recommended if Age 19-80 years, 30 pack-year currently smoking OR have quit w/in 15years.) does not qualify.   Vision Screening: Recommended annual ophthalmology exams for early detection of glaucoma and other disorders of the eye.  Dental Screening: Recommended annual dental exams for proper oral hygiene  Community Resource Referral / Chronic Care Management: CRR required this visit?  No   CCM required this  visit?  No      Plan:     I have personally reviewed and noted the following in the patient's chart:   Medical and social history Use of alcohol, tobacco or illicit drugs  Current medications and supplements including opioid prescriptions. Patient is currently taking opioid prescriptions. Information provided to patient regarding non-opioid alternatives. Patient advised to discuss non-opioid treatment plan with their provider. Followed by physician.  Functional ability and status Nutritional status Physical activity Advanced directives List of other physicians Hospitalizations, surgeries, and ER visits in previous 12 months Vitals Screenings to include cognitive, depression, and falls Referrals and appointments  In addition, I have reviewed and discussed with patient certain preventive protocols, quality metrics, and best practice recommendations. A written personalized care plan for preventive services as well as general preventive health recommendations were provided to patient.     Ashok Pall, LPN   1/61/0960

## 2021-09-27 NOTE — Progress Notes (Signed)
Acute Office Visit  Subjective:     Patient ID: Matthew Juarez, male    DOB: 05-25-1960, 61 y.o.   MRN: [id number]  Chief Complaint  Patient presents with   Foot Swelling    Bilateral foot swelling and draining, cracked skin. Noticed it on 09/25/21. Patient did go to [place] and was in the car-ride took about 9 to 10 hours but had a few stops on the way.    HPI Patient is in today for foot swelling  Started Wednesday while on vacation.  States they traveled back home last night.  They did drive to and from [place]. Patient is accompanied by his wife.  Patient in home electric wheelchair secondary to myelitis optica.  Patient also endorses he normally wears compression socks to help with swelling of bilateral lower extremities.  Swelling of the feet occurred on Wednesday and also cracked open Wednesday night.  The discharge is clear in nature. No pain, itching, fever, chills. States he has good sensation in bilateral feet.  There is some noted redness outlining some of the darker skin of his feet  Patient sees Dr. Tillman Sers at [facility] in [place] for his podiatrist.  States that they have called to see if they can see Dr. Ether Griffins but no available spots today and his colleagues were also full they did schedule him for July 18 per patient report  Review of Systems  Constitutional:  Negative for chills and fever.  Cardiovascular:  Positive for leg swelling.  Gastrointestinal:  Negative for nausea and vomiting.  Skin:  Positive for rash. Negative for itching.        Objective:    BP 118/84   Pulse 79   Temp 97.6 F (36.4 C)   Resp 16   SpO2 97%    Physical Exam Vitals and nursing note reviewed.  Constitutional:      Appearance: Normal appearance.  Cardiovascular:     Rate and Rhythm: Normal rate and regular rhythm.     Pulses:          Dorsalis pedis pulses are 2+ on the right side and 2+ on the left side.     Heart sounds: Normal heart sounds.  Pulmonary:      Effort: Pulmonary effort is normal.     Breath sounds: Normal breath sounds.  Musculoskeletal:     Right lower leg: Edema present.     Left lower leg: Edema present.  Skin:    Capillary Refill: Capillary refill takes less than 2 seconds.     Findings: Erythema and lesion present.     Comments: See clinical photo  Neurological:     Mental Status: He is alert.            No results found for any visits on 09/27/21.      Assessment & Plan:   Problem List Items Addressed This Visit       Other   Foot swelling - Primary    Likely secondary to infectious process.  Patient does generally wear compression stockings to the day did encourage patient to not wear those if possible and to keep his feet elevated is much as possible.  Also informed them to clean feet with just plain soap and water did give strict instructions when to be seen over the weekend.  We will have close follow-up pending CBC and BNP because patient has history of right heart failure      Relevant Orders   CBC  Brain natriuretic peptide   Cellulitis of lower extremity    Proactively treat cellulitis of bilateral lower feet.  Did administer 1 g of Rocephin in office IM x1 dose.  Will write prescription for doxycycline 100 mg twice daily for 7 days.  Patient to have close follow-up either with Dr. Birdie Sons or podiatry or me.  We will reach out to Dr. Ether Griffins to see if they can get him in urgently early next week.  Pending CBC      Relevant Medications   doxycycline (VIBRA-TABS) 100 MG tablet   Other Relevant Orders   CBC    Meds ordered this encounter  Medications   cefTRIAXone (ROCEPHIN) injection 1 g   doxycycline (VIBRA-TABS) 100 MG tablet    Sig: Take 1 tablet (100 mg total) by mouth 2 (two) times daily for 7 days.    Dispense:  14 tablet    Refill:  0    Order Specific Question:   Supervising Provider    Answer:   Roxy Manns A [[id number]]    Return for Follow up Monday 09/30/2021 with Dr  Osborne Casco or someone in his office. Audria Nine, NP

## 2021-09-27 NOTE — Patient Instructions (Signed)
Follow up Monday with Dr. Birdie Sons or someone in his office.  I am going to see if we cannot get you into the foot doctor next week.  Been seen over the weekend if you get greatly worse or have the signs we talked about

## 2021-09-27 NOTE — Assessment & Plan Note (Addendum)
Proactively treat cellulitis of bilateral lower feet.  Did administer 1 g of Rocephin in office IM x1 dose.  Will write prescription for doxycycline 100 mg twice daily for 7 days.  Patient to have close follow-up either with Dr. Birdie Sons or podiatry or me.  We will reach out to Dr. Ether Griffins to see if they can get him in urgently early next week.  Pending CBC

## 2021-09-27 NOTE — Telephone Encounter (Signed)
No answer when called for scheduled AWV. Unable to leave message, no voicemail. Okay to reschedule.

## 2021-09-27 NOTE — Telephone Encounter (Signed)
Called Dr [facility], asking if patient can be worked in next week to follow up with Dr Ether Griffins on bilateral cellulitis-see OV notes from today, receptionist sent note to the nurse to see if patient can be worked in and most likely will follow up with patient directly on this. I called patient and let him know this and to be on the look out for the phone call

## 2021-09-30 ENCOUNTER — Ambulatory Visit (INDEPENDENT_AMBULATORY_CARE_PROVIDER_SITE_OTHER): Payer: Medicare PPO | Admitting: Family Medicine

## 2021-09-30 ENCOUNTER — Encounter: Payer: Self-pay | Admitting: Family Medicine

## 2021-09-30 DIAGNOSIS — L03119 Cellulitis of unspecified part of limb: Secondary | ICD-10-CM

## 2021-09-30 MED ORDER — CEPHALEXIN 500 MG PO CAPS: 500.0000 mg | ORAL_CAPSULE | Freq: Four times a day (QID) | ORAL | 0 refills | Status: DC

## 2021-09-30 NOTE — Progress Notes (Signed)
Swollen feet.

## 2021-09-30 NOTE — Assessment & Plan Note (Addendum)
Patient with possible cellulitis though certainly symptoms could be related to restricted blood flow to his skin related to wearing compression stockings.  His toes are warm and freely mobile seemingly indicating that there is no underlying tissue necrosis and no neurological damage.  Discussed finishing his doxycycline.  We will start him on Keflex as well to cover for strep bacteria.  He will see his podiatrist on Friday as planned.  He was advised to seek medical attention immediately if he develops any worsening symptoms or systemic symptoms.  He will continue to cleanse the area with soap and water.  He will not wear his compression stockings until this has healed completely.

## 2021-09-30 NOTE — Progress Notes (Signed)
[name], MD Phone: [phone]  Matthew Juarez is a 61 y.o. male who presents today for f/u.  Leg swelling/cellulitis: Patient was seen on 09/27/2021 for leg swelling and possible cellulitis.  He was given Rocephin in the office and started on doxycycline.  He notes his right foot has improved quite a bit.  Notes his left foot remains swollen and does still have some erythema.  He notes no fevers or systemic illness symptoms.  He does see his podiatrist on Friday.  He has been cleaning with soap and water.  He notes no spreading of the symptoms.  Notably CBC had a normal WBC.  The patient notes this started after he was wearing his compression socks while at [facility].  He notes he got very hot and he wonders if the compression socks were too tight.  Social History   Tobacco Use  Smoking Status Former   Years: 0.50   Types: Cigarettes   Quit date: 04/01/1983   Years since quitting: 38.5  Smokeless Tobacco Never  Tobacco Comments   only smoke 2- cigs per day when he smoked    Current Outpatient Medications on File Prior to Visit  Medication Sig Dispense Refill   acetaminophen (TYLENOL) 500 MG tablet Take 500 mg by mouth every 6 (six) hours as needed for mild pain.     allopurinol (ZYLOPRIM) 300 MG tablet TAKE 1 TABLET BY MOUTH EVERY DAY 90 tablet 3   atorvastatin (LIPITOR) 40 MG tablet TAKE 1 TABLET BY MOUTH EVERY DAY 90 tablet 3   Cholecalciferol (VITAMIN D) 2000 UNITS CAPS Take 2,000 Units by mouth daily.     Clobetasol Propionate 0.05 % lotion APPLY TO AFFECTED AREA TWICE A DAY AS NEEDED     Coenzyme Q10 (CO Q-10) 200 MG CAPS Take 1 capsule by mouth daily. 30 each 1   desonide (DESOWEN) 0.05 % cream APPLY TO AFECTED AREA TWICE A DAY AS NEEDED  3   doxycycline (VIBRA-TABS) 100 MG tablet Take 1 tablet (100 mg total) by mouth 2 (two) times daily for 7 days. 14 tablet 0   esomeprazole (NEXIUM) 20 MG capsule Take 1 capsule (20 mg total) by mouth daily before breakfast. 90  capsule 3   Fluocinolone Acetonide Scalp 0.01 % OIL APPLY TO SCALP AT BEDTIME, WASH OUT WHEN WAKE UP  3   gabapentin (NEURONTIN) 600 MG tablet Take 1 tablet (600 mg total) by mouth 2 (two) times daily. 180 tablet 1   ketoconazole (NIZORAL) 2 % cream Apply 1 application topically daily. 15 g 0   Oxycodone HCl 10 MG TABS Take by mouth.     XARELTO 20 MG TABS tablet TAKE 1 TABLET BY MOUTH DAILY WITH SUPPER. 90 tablet 3   No current facility-administered medications on file prior to visit.     ROS see history of present illness  Objective  Physical Exam Vitals:   09/30/21 0853  BP: 126/78  Pulse: 67  Temp: 98.1 F (36.7 C)  SpO2: 95%    BP Readings from Last 3 Encounters:  09/30/21 126/78  09/27/21 118/84  09/27/21 118/84   Wt Readings from Last 3 Encounters:  09/30/21 299 lb (135.6 kg)  09/27/21 299 lb (135.6 kg)  06/26/21 299 lb 6.4 oz (135.8 kg)    Physical Exam  Right foot, nontender, no fluctuance, toes are warm and mobile    Right foot   Left foot    Left foot, nontender, no fluctuance, toes are warm and mobile  Assessment/Plan: Please see individual problem list.  Problem List Items Addressed This Visit     Cellulitis of lower extremity    Patient with possible cellulitis though certainly symptoms could be related to restricted blood flow to his skin related to wearing compression stockings.  His toes are warm and freely mobile seemingly indicating that there is no underlying tissue necrosis and no neurological damage.  Discussed finishing his doxycycline.  We will start him on Keflex as well to cover for strep bacteria.  He will see his podiatrist on Friday as planned.  He was advised to seek medical attention immediately if he develops any worsening symptoms or systemic symptoms.  He will continue to cleanse the area with soap and water.  He will not wear his compression stockings until this has healed completely.      Relevant Medications   cephALEXin  (KEFLEX) 500 MG capsule      Return in about 1 week (around 10/07/2021) for Foot swelling/cellulitis.   [name], MD [facility]

## 2021-10-04 DIAGNOSIS — I87303 Chronic venous hypertension (idiopathic) without complications of bilateral lower extremity: Secondary | ICD-10-CM | POA: Diagnosis not present

## 2021-10-04 DIAGNOSIS — L03116 Cellulitis of left lower limb: Secondary | ICD-10-CM | POA: Diagnosis not present

## 2021-10-04 DIAGNOSIS — G373 Acute transverse myelitis in demyelinating disease of central nervous system: Secondary | ICD-10-CM | POA: Diagnosis not present

## 2021-10-04 DIAGNOSIS — Z86718 Personal history of other venous thrombosis and embolism: Secondary | ICD-10-CM | POA: Diagnosis not present

## 2021-10-07 ENCOUNTER — Ambulatory Visit: Payer: Medicare PPO | Admitting: Family Medicine

## 2021-10-07 ENCOUNTER — Encounter: Payer: Self-pay | Admitting: Family Medicine

## 2021-10-07 DIAGNOSIS — L03119 Cellulitis of unspecified part of limb: Secondary | ICD-10-CM

## 2021-10-07 NOTE — Assessment & Plan Note (Signed)
Significant improvement.  He will finish his course of Keflex.  He will follow-up with podiatry as planned.  If he has any recurrence of infectious symptoms he will let us know.

## 2021-10-07 NOTE — Progress Notes (Signed)
[name], MD Phone: [phone]  Matthew Juarez is a 61 y.o. male who presents today for f/u.  Cellulitis/foot swelling: Patient notes his right foot has resolved.  He saw podiatry last week and they advised he could resume wearing his compression stocking on the right leg.  His left foot continues to have some swelling and skin flaking and peeling.  He has 1 more dose of Keflex left.  He has not had any diarrhea with the antibiotics that he has been on.  He follows up with podiatry in a couple of weeks.  Social History   Tobacco Use  Smoking Status Former   Years: 0.50   Types: Cigarettes   Quit date: 04/01/1983   Years since quitting: 38.5  Smokeless Tobacco Never  Tobacco Comments   only smoke 2- cigs per day when he smoked    Current Outpatient Medications on File Prior to Visit  Medication Sig Dispense Refill   acetaminophen (TYLENOL) 500 MG tablet Take 500 mg by mouth every 6 (six) hours as needed for mild pain.     allopurinol (ZYLOPRIM) 300 MG tablet TAKE 1 TABLET BY MOUTH EVERY DAY 90 tablet 3   atorvastatin (LIPITOR) 40 MG tablet TAKE 1 TABLET BY MOUTH EVERY DAY 90 tablet 3   cephALEXin (KEFLEX) 500 MG capsule Take 1 capsule (500 mg total) by mouth 4 (four) times daily. 28 capsule 0   Cholecalciferol (VITAMIN D) 2000 UNITS CAPS Take 2,000 Units by mouth daily.     Clobetasol Propionate 0.05 % lotion APPLY TO AFFECTED AREA TWICE A DAY AS NEEDED     Coenzyme Q10 (CO Q-10) 200 MG CAPS Take 1 capsule by mouth daily. 30 each 1   desonide (DESOWEN) 0.05 % cream APPLY TO AFECTED AREA TWICE A DAY AS NEEDED  3   esomeprazole (NEXIUM) 20 MG capsule Take 1 capsule (20 mg total) by mouth daily before breakfast. 90 capsule 3   Fluocinolone Acetonide Scalp 0.01 % OIL APPLY TO SCALP AT BEDTIME, WASH OUT WHEN WAKE UP  3   gabapentin (NEURONTIN) 600 MG tablet Take 1 tablet (600 mg total) by mouth 2 (two) times daily. 180 tablet 1   ketoconazole (NIZORAL) 2 % cream Apply 1  application topically daily. 15 g 0   Oxycodone HCl 10 MG TABS Take by mouth.     XARELTO 20 MG TABS tablet TAKE 1 TABLET BY MOUTH DAILY WITH SUPPER. 90 tablet 3   No current facility-administered medications on file prior to visit.     ROS see history of present illness  Objective  Physical Exam Vitals:   10/07/21 1339  BP: 110/80  Pulse: 60  Temp: 98 F (36.7 C)  SpO2: 99%    BP Readings from Last 3 Encounters:  10/07/21 110/80  09/30/21 126/78  09/27/21 118/84   Wt Readings from Last 3 Encounters:  10/07/21 299 lb (135.6 kg)  09/30/21 299 lb (135.6 kg)  09/27/21 299 lb (135.6 kg)    Physical Exam    Left foot with no tenderness, warmth, or erythema, no fluctuance  Assessment/Plan: Please see individual problem list.  Problem List Items Addressed This Visit     Cellulitis of lower extremity    Significant improvement.  He will finish his course of Keflex.  He will follow-up with podiatry as planned.  If he has any recurrence of infectious symptoms he will let us know.        Return for As scheduled.   [name],  MD [place]

## 2021-10-15 DIAGNOSIS — G373 Acute transverse myelitis in demyelinating disease of central nervous system: Secondary | ICD-10-CM | POA: Diagnosis not present

## 2021-10-15 DIAGNOSIS — Z86718 Personal history of other venous thrombosis and embolism: Secondary | ICD-10-CM | POA: Diagnosis not present

## 2021-10-15 DIAGNOSIS — L03116 Cellulitis of left lower limb: Secondary | ICD-10-CM | POA: Diagnosis not present

## 2021-10-15 DIAGNOSIS — I87303 Chronic venous hypertension (idiopathic) without complications of bilateral lower extremity: Secondary | ICD-10-CM | POA: Diagnosis not present

## 2021-10-17 DIAGNOSIS — H04123 Dry eye syndrome of bilateral lacrimal glands: Secondary | ICD-10-CM | POA: Diagnosis not present

## 2021-10-17 DIAGNOSIS — H25813 Combined forms of age-related cataract, bilateral: Secondary | ICD-10-CM | POA: Diagnosis not present

## 2021-10-17 DIAGNOSIS — H02885 Meibomian gland dysfunction left lower eyelid: Secondary | ICD-10-CM | POA: Diagnosis not present

## 2021-10-17 DIAGNOSIS — H02882 Meibomian gland dysfunction right lower eyelid: Secondary | ICD-10-CM | POA: Diagnosis not present

## 2021-10-28 DIAGNOSIS — G890 Central pain syndrome: Secondary | ICD-10-CM | POA: Diagnosis not present

## 2021-10-28 DIAGNOSIS — G894 Chronic pain syndrome: Secondary | ICD-10-CM | POA: Diagnosis not present

## 2021-10-28 DIAGNOSIS — M25512 Pain in left shoulder: Secondary | ICD-10-CM | POA: Diagnosis not present

## 2021-10-28 DIAGNOSIS — G8929 Other chronic pain: Secondary | ICD-10-CM | POA: Diagnosis not present

## 2021-11-04 DIAGNOSIS — G360 Neuromyelitis optica [Devic]: Secondary | ICD-10-CM | POA: Diagnosis not present

## 2021-11-04 DIAGNOSIS — R269 Unspecified abnormalities of gait and mobility: Secondary | ICD-10-CM | POA: Diagnosis not present

## 2021-11-04 DIAGNOSIS — Z79899 Other long term (current) drug therapy: Secondary | ICD-10-CM | POA: Diagnosis not present

## 2021-11-04 DIAGNOSIS — G8929 Other chronic pain: Secondary | ICD-10-CM | POA: Diagnosis not present

## 2021-11-29 DIAGNOSIS — G360 Neuromyelitis optica [Devic]: Secondary | ICD-10-CM | POA: Diagnosis not present

## 2021-12-12 ENCOUNTER — Ambulatory Visit: Payer: Medicare PPO | Admitting: Internal Medicine

## 2021-12-12 ENCOUNTER — Encounter: Payer: Self-pay | Admitting: Internal Medicine

## 2021-12-12 VITALS — BP 120/80 | HR 60 | Temp 97.7°F | Ht 77.0 in | Wt 299.0 lb

## 2021-12-12 DIAGNOSIS — Z1329 Encounter for screening for other suspected endocrine disorder: Secondary | ICD-10-CM | POA: Diagnosis not present

## 2021-12-12 DIAGNOSIS — L03116 Cellulitis of left lower limb: Secondary | ICD-10-CM | POA: Diagnosis not present

## 2021-12-12 DIAGNOSIS — R7303 Prediabetes: Secondary | ICD-10-CM | POA: Diagnosis not present

## 2021-12-12 DIAGNOSIS — R600 Localized edema: Secondary | ICD-10-CM

## 2021-12-12 DIAGNOSIS — Z125 Encounter for screening for malignant neoplasm of prostate: Secondary | ICD-10-CM

## 2021-12-12 DIAGNOSIS — I890 Lymphedema, not elsewhere classified: Secondary | ICD-10-CM

## 2021-12-12 DIAGNOSIS — I872 Venous insufficiency (chronic) (peripheral): Secondary | ICD-10-CM

## 2021-12-12 LAB — CBC WITH DIFFERENTIAL/PLATELET
Basophils Absolute: 0 10*3/uL (ref 0.0–0.1)
Basophils Relative: 0.8 % (ref 0.0–3.0)
Eosinophils Absolute: 0.2 10*3/uL (ref 0.0–0.7)
Eosinophils Relative: 4 % (ref 0.0–5.0)
HCT: 41.7 % (ref 39.0–52.0)
Hemoglobin: 13.7 g/dL (ref 13.0–17.0)
Lymphocytes Relative: 22.7 % (ref 12.0–46.0)
Lymphs Abs: 0.9 10*3/uL (ref 0.7–4.0)
MCHC: 32.9 g/dL (ref 30.0–36.0)
MCV: 87.6 fl (ref 78.0–100.0)
Monocytes Absolute: 0.4 10*3/uL (ref 0.1–1.0)
Monocytes Relative: 10.2 % (ref 3.0–12.0)
Neutro Abs: 2.5 10*3/uL (ref 1.4–7.7)
Neutrophils Relative %: 62.3 % (ref 43.0–77.0)
Platelets: 190 10*3/uL (ref 150.0–400.0)
RBC: 4.75 Mil/uL (ref 4.22–5.81)
RDW: 13.3 % (ref 11.5–15.5)
WBC: 4 10*3/uL (ref 4.0–10.5)

## 2021-12-12 LAB — COMPREHENSIVE METABOLIC PANEL
ALT: 15 U/L (ref 0–53)
AST: 17 U/L (ref 0–37)
Albumin: 4.2 g/dL (ref 3.5–5.2)
Alkaline Phosphatase: 106 U/L (ref 39–117)
BUN: 19 mg/dL (ref 6–23)
CO2: 30 mEq/L (ref 19–32)
Calcium: 9.6 mg/dL (ref 8.4–10.5)
Chloride: 103 mEq/L (ref 96–112)
Creatinine, Ser: 1.01 mg/dL (ref 0.40–1.50)
GFR: 79.37 mL/min (ref >60.00)
Glucose, Bld: 94 mg/dL (ref 70–99)
Potassium: 4.3 mEq/L (ref 3.5–5.1)
Sodium: 140 mEq/L (ref 135–145)
Total Bilirubin: 1.7 mg/dL — ABNORMAL HIGH (ref 0.2–1.2)
Total Protein: 6.4 g/dL (ref 6.0–8.3)

## 2021-12-12 LAB — HEMOGLOBIN A1C: Hgb A1c MFr Bld: 6.2 % (ref 4.6–6.5)

## 2021-12-12 LAB — PSA: PSA: 0.53 ng/mL (ref 0.10–4.00)

## 2021-12-12 LAB — BRAIN NATRIURETIC PEPTIDE: Pro B Natriuretic peptide (BNP): 6 pg/mL (ref 0.0–100.0)

## 2021-12-12 LAB — TSH: TSH: 1.46 u[IU]/mL (ref 0.35–5.50)

## 2021-12-12 MED ORDER — FUROSEMIDE 20 MG PO TABS: 20.0000 mg | ORAL_TABLET | Freq: Every day | ORAL | 1 refills | Status: DC

## 2021-12-12 MED ORDER — DOXYCYCLINE HYCLATE 100 MG PO TABS: 100.0000 mg | ORAL_TABLET | Freq: Two times a day (BID) | ORAL | 0 refills | Status: DC

## 2021-12-12 NOTE — Patient Instructions (Addendum)
Vascular for unna boot  Take lasix 20 mg daily in the am  Phone Fax E-mail Address  [phone] Not available [address]   [place] [address]     Specialties     Vascular Surgery, Cardiology, Radiology, Vascular Surgery             Lymphedema  Lymphedema is swelling that is caused by the abnormal collection of lymph in the tissues under the skin. Lymph is excess fluid from the tissues in your body that is removed through the lymphatic system. This system is part of your body's defense system (immune system) and includes lymph nodes and lymph vessels. The lymph vessels collect and carry the excess fluid, fats, proteins, and waste from the tissues of the body to the bloodstream. This system also works to clean and remove bacteria and waste products from the body. Lymphedema occurs when the lymphatic system is blocked. When the lymph vessels or lymph nodes are blocked or damaged, lymph does not drain properly. This causes an abnormal buildup of lymph, which leads to swelling in the affected area. This may include the trunk area, or an arm or leg. Lymphedema cannot be cured by medicines, but various methods can be used to help reduce the swelling. What are the causes? The cause of this condition depends on the type of lymphedema that you have. Primary lymphedema is caused by the absence of lymph vessels or having abnormal lymph vessels at birth. Secondary lymphedema occurs when lymph vessels are blocked or damaged. Secondary lymphedema is more common. Common causes of lymph vessel blockage include: Skin infection, such as cellulitis. Infection by parasites (filariasis). Injury. Radiation therapy. Cancer. Formation of scar tissue. Surgery. What are the signs or symptoms? Symptoms of this condition include: Swelling of the arm or leg. A heavy or tight feeling in the arm or leg. Swelling of the feet, toes, or fingers. Shoes or rings may fit more tightly than  before. Redness of the skin over the affected area. Limited movement of the affected limb. Sensitivity to touch or discomfort in the affected limb. How is this diagnosed? This condition may be diagnosed based on: Your symptoms and medical history. A physical exam. Bioimpedance spectroscopy. In this test, painless electrical currents are used to measure fluid levels in your body. Imaging tests, such as: MRI. CT scan. Duplex ultrasound. This test uses sound waves to produce images of the vessels and the blood flow on a screen. Lymphoscintigraphy. In this test, a low dose of a radioactive substance is injected to trace the flow of lymph through your lymph vessels. Lymphangiography. In this test, a contrast dye is injected into the lymph vessel to help show blockages. How is this treated?  If an underlying condition is causing the lymphedema, that condition will be treated. For example, antibiotic medicines may be used to treat an infection. Treatment for this condition will depend on the cause of your lymphedema. Treatment may include: Complete decongestive therapy (CDT). This is done by a certified lymphedema therapist to reduce fluid congestion. This therapy includes: Skin care. Compression wrapping of the affected area. Manual lymph drainage. This is a special massage technique that promotes lymph drainage out of a limb. Specific exercises. Certain exercises can help fluid move out of the affected limb. Compression. Various methods may be used to apply pressure to the affected limb to reduce the swelling. They include: Wearing compression stockings or sleeves on the affected limb. Wrapping the affected limb with special bandages. Surgery. This  is usually done for severe cases only. For example, surgery may be done if you have trouble moving the limb or if the swelling does not get better with other treatments. Follow these instructions at home: Self-care The affected area is more likely  to become injured or infected. Take these steps to help prevent infection: Keep the affected area clean and dry. Use approved creams or lotions to keep the skin moisturized. Protect your skin from cuts: Use gloves while cooking or gardening. Do not walk barefoot. If you shave the affected area, use an Neurosurgeon. Do not wear tight clothes, shoes, or jewelry. Eat a healthy diet that includes a lot of fruits and vegetables. Activity Do exercises as told by your health care provider. Do not sit with your legs crossed. When possible, keep the affected limb raised (elevated) above the level of your heart. Avoid carrying things with an arm that is affected by lymphedema. General instructions Wear compression stockings or sleeves as told by your health care provider. Note any changes in size of the affected limb. You may be instructed to take regular measurements and keep track of them. Take over-the-counter and prescription medicines only as told by your health care provider. If you were prescribed an antibiotic medicine, take or apply it as told by your health care provider. Do not stop using the antibiotic even if you start to feel better or if your condition improves. Do not use heating pads or ice packs on the affected area. Avoid having blood draws, IV insertions, or blood pressure checks on the affected limb. Keep all follow-up visits. This is important. Contact a health care provider if you: Continue to have swelling in your limb. Have fluid leaking from the skin of your swollen limb. Have a cut that does not heal. Have redness or pain in the affected area. Develop purplish spots, rash, blisters, or sores (lesions) on your affected limb. Get help right away if you: Have new swelling in your limb that starts suddenly. Have shortness of breath or chest pain. Have a fever or chills. These symptoms may represent a serious problem that is an emergency. Do not wait to see if the  symptoms will go away. Get medical help right away. Call your local emergency services (911 in the [place] Do not drive yourself to the hospital. Summary Lymphedema is swelling that is caused by the abnormal collection of lymph in the tissues under the skin. Lymph is fluid from the tissues in your body that is removed through the lymphatic system. This system collects and carries excess fluid, fats, proteins, and wastes from the tissues of the body to the bloodstream. Lymphedema causes swelling, pain, and redness in the affected area. This may include the trunk area, or an arm or leg. Treatment for this condition may depend on the cause of your lymphedema. Treatment may include treating the underlying cause, complete decongestive therapy (CDT), compression methods, or surgery. This information is not intended to replace advice given to you by your health care provider. Make sure you discuss any questions you have with your health care provider. Document Revised: 01/11/2020 Document Reviewed: 01/11/2020 Elsevier Patient Education  2023 [employer].

## 2021-12-12 NOTE — Progress Notes (Signed)
Chief Complaint  Patient presents with   Foot Swelling    Both feet are swollen pt  stated there isnt any heat coming from them and he said his pain level is at a 1    F/u  B/l feet swelling since 08/2021 when he drove to [place] drinking 3-4 bottles of water + soda daily echo in 0865 with diastolic dysfunction EF 78-46% he has seen podiatry and given keflex due to cellulitis b/l feet due to swelling and left foot is leaking > right foot this has never happened to him before he does eat out but does not think diet is high in salt     Review of Systems  Constitutional:  Negative for weight loss.  HENT:  Negative for hearing loss.   Eyes:  Negative for blurred vision.  Respiratory:  Negative for shortness of breath.   Cardiovascular:  Positive for leg swelling. Negative for chest pain.  Gastrointestinal:  Negative for abdominal pain and blood in stool.  Musculoskeletal:  Negative for back pain.  Skin:  Negative for rash.  Neurological:  Negative for headaches.  Psychiatric/Behavioral:  Negative for depression.    Past Medical History:  Diagnosis Date   Arthritis    Cardiac arrest ([place]) 06/23/2012   Chronic back pain    COVID-19 04/19/2019   Gout    Hammer toe    Headache(784.0)    History of blood clots    Hypertension    Pulmonary embolism ([place])    Pulmonary infarction ([place]) 07/23/2012   Overview:  Last Assessment & Plan:  With associated R heart failure and cardiac arrest.  - you could make an argument to treat him with lifelong coumadin given the severity of his PE, although the literature supports 12 months.  - he will be treated (at least) 12 months, to be followed by Dr Karlton Lemon.  - will need hypercoag w/u a month after the coumadin is stopped - restart coumadin if at any incr   Transverse myelitis [facility])    Past Surgical History:  Procedure Laterality Date   HAMMER TOE SURGERY     2016   Family History  Problem Relation Age of Onset   Dementia Mother    Lung cancer  Maternal Uncle    Colon cancer Maternal Aunt    Lung cancer Maternal Aunt    Heart disease Neg Hx    Social History   Socioeconomic History   Marital status: Married    Spouse name: Not on file   Number of children: Not on file   Years of education: Not on file   Highest education level: Not on file  Occupational History   Occupation: disability  Tobacco Use   Smoking status: Former    Years: 0.50    Types: Cigarettes    Quit date: 04/01/1983    Years since quitting: 38.7   Smokeless tobacco: Never   Tobacco comments:    only smoke 2- cigs per day when he smoked  Vaping Use   Vaping Use: Never used  Substance and Sexual Activity   Alcohol use: No    Alcohol/week: 0.0 standard drinks of alcohol   Drug use: No   Sexual activity: Not Currently  Other Topics Concern   Not on file  Social History Narrative   Married    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    From [place] went to [employer]   Social Determinants of Health   Financial Resource Strain: Low Risk  (  09/27/2021)   Overall Financial Resource Strain (CARDIA)    Difficulty of Paying Living Expenses: Not hard at all  Food Insecurity: No Food Insecurity (09/27/2021)   Hunger Vital Sign    Worried About Running Out of Food in the Last Year: Never true    Ran Out of Food in the Last Year: Never true  Transportation Needs: No Transportation Needs (09/27/2021)   PRAPARE - Hydrologist (Medical): No    Lack of Transportation (Non-Medical): No  Physical Activity: Inactive (09/21/2020)   Exercise Vital Sign    Days of Exercise per Week: 0 days    Minutes of Exercise per Session: 30 min  Stress: No Stress Concern Present (09/27/2021)   [place]    Feeling of Stress : Not at all  Social Connections: Unknown (09/27/2021)   Social Connection and Isolation Panel [NHANES]    Frequency of Communication with Friends and Family: Not on file     Frequency of Social Gatherings with Friends and Family: Not on file    Attends Religious Services: Not on file    Active Member of Clubs or Organizations: Not on file    Attends Archivist Meetings: Not on file    Marital Status: Married  Intimate Partner Violence: Not At Risk (09/27/2021)   Humiliation, Afraid, Rape, and Kick questionnaire    Fear of Current or Ex-Partner: No    Emotionally Abused: No    Physically Abused: No    Sexually Abused: No   Current Meds  Medication Sig   acetaminophen (TYLENOL) 500 MG tablet Take 500 mg by mouth every 6 (six) hours as needed for mild pain.   allopurinol (ZYLOPRIM) 300 MG tablet TAKE 1 TABLET BY MOUTH EVERY DAY   atorvastatin (LIPITOR) 40 MG tablet TAKE 1 TABLET BY MOUTH EVERY DAY   Cholecalciferol (VITAMIN D) 2000 UNITS CAPS Take 2,000 Units by mouth daily.   Clobetasol Propionate 0.05 % lotion APPLY TO AFFECTED AREA TWICE A DAY AS NEEDED   Coenzyme Q10 (CO Q-10) 200 MG CAPS Take 1 capsule by mouth daily.   desonide (DESOWEN) 0.05 % cream APPLY TO AFECTED AREA TWICE A DAY AS NEEDED   doxycycline (VIBRA-TABS) 100 MG tablet Take 1 tablet (100 mg total) by mouth 2 (two) times daily. With food   esomeprazole (NEXIUM) 20 MG capsule Take 1 capsule (20 mg total) by mouth daily before breakfast.   Fluocinolone Acetonide Scalp 0.01 % OIL APPLY TO SCALP AT BEDTIME, WASH OUT WHEN WAKE UP   furosemide (LASIX) 20 MG tablet Take 1 tablet (20 mg total) by mouth daily. In the am leg edema   gabapentin (NEURONTIN) 600 MG tablet Take 1 tablet (600 mg total) by mouth 2 (two) times daily.   ketoconazole (NIZORAL) 2 % cream Apply 1 application topically daily.   Oxycodone HCl 10 MG TABS Take by mouth.   XARELTO 20 MG TABS tablet TAKE 1 TABLET BY MOUTH DAILY WITH SUPPER.   [DISCONTINUED] cephALEXin (KEFLEX) 500 MG capsule Take 1 capsule (500 mg total) by mouth 4 (four) times daily.   No Known Allergies Recent Results (from the past 2160 hour(s))   CBC     Status: None   Collection Time: 09/27/21 12:07 PM  Result Value Ref Range   WBC 8.4 4.0 - 10.5 K/uL   RBC 4.80 4.22 - 5.81 Mil/uL   Platelets 190.0 150.0 - 400.0 K/uL   Hemoglobin 13.9  13.0 - 17.0 g/dL   HCT 42.8 39.0 - 52.0 %   MCV 89.1 78.0 - 100.0 fl   MCHC 32.5 30.0 - 36.0 g/dL   RDW 13.9 11.5 - 15.5 %  Brain natriuretic peptide     Status: None   Collection Time: 09/27/21 12:07 PM  Result Value Ref Range   Pro B Natriuretic peptide (BNP) 19.0 0.0 - 100.0 pg/mL   Objective  Body mass index is 35.46 kg/m. Wt Readings from Last 3 Encounters:  12/12/21 299 lb (135.6 kg)  10/07/21 299 lb (135.6 kg)  09/30/21 299 lb (135.6 kg)   Temp Readings from Last 3 Encounters:  12/12/21 97.7 F (36.5 C) (Oral)  10/07/21 98 F (36.7 C) (Oral)  09/30/21 98.1 F (36.7 C) (Oral)   BP Readings from Last 3 Encounters:  12/12/21 120/80  10/07/21 110/80  09/30/21 126/78   Pulse Readings from Last 3 Encounters:  12/12/21 60  10/07/21 60  09/30/21 67    Physical Exam Vitals and nursing note reviewed.  Constitutional:      Appearance: Normal appearance. He is well-developed and well-groomed.  HENT:     Head: Normocephalic and atraumatic.  Eyes:     Conjunctiva/sclera: Conjunctivae normal.     Pupils: Pupils are equal, round, and reactive to light.  Cardiovascular:     Rate and Rhythm: Normal rate and regular rhythm.     Heart sounds: Normal heart sounds.     Comments: Cellulitis changes and fluid leaking left foot with stasis derm changes L>r clear fluid leaking left foot Pulmonary:     Effort: Pulmonary effort is normal. No respiratory distress.     Breath sounds: Normal breath sounds.  Abdominal:     Tenderness: There is no abdominal tenderness.  Musculoskeletal:     Right lower leg: 2+ Pitting Edema present.     Left lower leg: 2+ Pitting Edema present.  Skin:    General: Skin is warm and moist.  Neurological:     General: No focal deficit present.     Mental  Status: He is alert and oriented to person, place, and time. Mental status is at baseline.     Gait: Gait is intact. Gait normal.     Comments: In wheelchair electric  Psychiatric:        Attention and Perception: Attention and perception normal.        Mood and Affect: Mood and affect normal.        Speech: Speech normal.        Behavior: Behavior normal. Behavior is cooperative.        Thought Content: Thought content normal.        Cognition and Memory: Cognition and memory normal.        Judgment: Judgment normal.     Assessment  Plan  Leg edema - Plan: Comprehensive metabolic panel, CBC with Differential/Platelet, B Nat Peptide, TSH, furosemide (LASIX) 20 MG tablet qam with high K food list low in sugar, Ambulatory referral to Vascular Surgery for unna boot and consider lymph pumps Consider repeat echo lst in 2018   Lymphedema - Plan: Ambulatory referral to Vascular Surgery, doxycycline (VIBRA-TABS) 100 MG tablet Cellulitis of left lower extremity - Plan: doxycycline (VIBRA-TABS) 100 MG tablet  Venous stasis dermatitis of both lower extremities   Fu PCP as sch Provider: Dr. Olivia Mackie McLean-Scocuzza-Internal Medicine

## 2021-12-17 ENCOUNTER — Ambulatory Visit (INDEPENDENT_AMBULATORY_CARE_PROVIDER_SITE_OTHER): Payer: Medicare PPO | Admitting: Nurse Practitioner

## 2021-12-17 ENCOUNTER — Encounter (INDEPENDENT_AMBULATORY_CARE_PROVIDER_SITE_OTHER): Payer: Self-pay

## 2021-12-17 VITALS — BP 144/84 | HR 57 | Resp 18

## 2021-12-17 DIAGNOSIS — I83019 Varicose veins of right lower extremity with ulcer of unspecified site: Secondary | ICD-10-CM

## 2021-12-17 DIAGNOSIS — L97919 Non-pressure chronic ulcer of unspecified part of right lower leg with unspecified severity: Secondary | ICD-10-CM | POA: Diagnosis not present

## 2021-12-17 DIAGNOSIS — L97929 Non-pressure chronic ulcer of unspecified part of left lower leg with unspecified severity: Secondary | ICD-10-CM | POA: Diagnosis not present

## 2021-12-17 DIAGNOSIS — L03119 Cellulitis of unspecified part of limb: Secondary | ICD-10-CM | POA: Diagnosis not present

## 2021-12-17 DIAGNOSIS — I83029 Varicose veins of left lower extremity with ulcer of unspecified site: Secondary | ICD-10-CM

## 2021-12-17 NOTE — Progress Notes (Unsigned)
History of Present Illness  There is no documented history at this time  Assessments & Plan   There are no diagnoses linked to this encounter.    Additional instructions  Subjective:  Patient presents with venous ulcer of the Bilateral lower extremity.    Procedure:  3 layer unna wrap was placed Bilateral lower extremity.   Plan:   Follow up in one week.  

## 2021-12-18 ENCOUNTER — Encounter (INDEPENDENT_AMBULATORY_CARE_PROVIDER_SITE_OTHER): Payer: Self-pay | Admitting: Nurse Practitioner

## 2021-12-24 ENCOUNTER — Ambulatory Visit (INDEPENDENT_AMBULATORY_CARE_PROVIDER_SITE_OTHER): Payer: Medicare PPO | Admitting: Nurse Practitioner

## 2021-12-24 ENCOUNTER — Encounter (INDEPENDENT_AMBULATORY_CARE_PROVIDER_SITE_OTHER): Payer: Self-pay | Admitting: Nurse Practitioner

## 2021-12-24 VITALS — BP 120/76 | HR 60 | Resp 16

## 2021-12-24 DIAGNOSIS — L03119 Cellulitis of unspecified part of limb: Secondary | ICD-10-CM | POA: Diagnosis not present

## 2021-12-24 NOTE — Progress Notes (Unsigned)
History of Present Illness  There is no documented history at this time  Assessments & Plan   There are no diagnoses linked to this encounter.    Additional instructions  Subjective:  Patient presents with venous ulcer of the Bilateral lower extremity.    Procedure:  3 layer unna wrap was placed Bilateral lower extremity.   Plan:   Follow up in one week.  

## 2021-12-25 ENCOUNTER — Other Ambulatory Visit: Payer: Self-pay | Admitting: Family Medicine

## 2021-12-27 ENCOUNTER — Ambulatory Visit: Payer: Medicare PPO | Admitting: Family Medicine

## 2021-12-30 ENCOUNTER — Ambulatory Visit: Payer: Medicare PPO | Admitting: Family Medicine

## 2021-12-31 ENCOUNTER — Encounter (INDEPENDENT_AMBULATORY_CARE_PROVIDER_SITE_OTHER): Payer: Self-pay

## 2021-12-31 ENCOUNTER — Ambulatory Visit (INDEPENDENT_AMBULATORY_CARE_PROVIDER_SITE_OTHER): Payer: Medicare PPO | Admitting: Nurse Practitioner

## 2021-12-31 VITALS — BP 171/74 | HR 78 | Resp 16

## 2021-12-31 DIAGNOSIS — L03115 Cellulitis of right lower limb: Secondary | ICD-10-CM | POA: Diagnosis not present

## 2021-12-31 DIAGNOSIS — L03119 Cellulitis of unspecified part of limb: Secondary | ICD-10-CM | POA: Diagnosis not present

## 2021-12-31 NOTE — Progress Notes (Signed)
History of Present Illness  There is no documented history at this time  Assessments & Plan   There are no diagnoses linked to this encounter.    Additional instructions  Subjective:  Patient presents with venous ulcer of the Bilateral lower extremity.    Procedure:  3 layer unna wrap was placed Bilateral lower extremity.   Plan:   Follow up in one week.  

## 2022-01-03 ENCOUNTER — Other Ambulatory Visit: Payer: Self-pay | Admitting: Internal Medicine

## 2022-01-03 ENCOUNTER — Ambulatory Visit: Payer: Medicare PPO | Admitting: Family Medicine

## 2022-01-03 ENCOUNTER — Encounter: Payer: Self-pay | Admitting: Family Medicine

## 2022-01-03 VITALS — BP 125/80 | HR 74 | Temp 97.9°F | Ht 77.0 in | Wt 303.4 lb

## 2022-01-03 DIAGNOSIS — K219 Gastro-esophageal reflux disease without esophagitis: Secondary | ICD-10-CM

## 2022-01-03 DIAGNOSIS — Z23 Encounter for immunization: Secondary | ICD-10-CM

## 2022-01-03 DIAGNOSIS — R600 Localized edema: Secondary | ICD-10-CM | POA: Diagnosis not present

## 2022-01-03 DIAGNOSIS — E785 Hyperlipidemia, unspecified: Secondary | ICD-10-CM | POA: Diagnosis not present

## 2022-01-03 NOTE — Progress Notes (Signed)
[name], MD Phone: [phone]  Matthew Juarez is a 61 y.o. male who presents today for f/u.  HYPERLIPIDEMIA Symptoms Chest pain on exertion:  no    Medications: Compliance- taking lipitor Right upper quadrant pain- no  Muscle aches- no Lipid Panel     Component Value Date/Time   CHOL 120 06/26/2021 0949   TRIG 89.0 06/26/2021 0949   HDL 33.80 (L) 06/26/2021 0949   CHOLHDL 4 06/26/2021 0949   VLDL 17.8 06/26/2021 0949   LDLCALC 69 06/26/2021 0949   LDLDIRECT 80.0 04/27/2017 1051   GERD:  Has had reflux for at least 10 years  Reflux symptoms: no   Abd pain: no   Blood in stool: no  Dysphagia: no   EGD: no  Medication: taking nexium  Leg edema: Has been improving with Unna boots.  He sees vascular surgery next week.  He has also been on Lasix 20 mg daily.   Social History   Tobacco Use  Smoking Status Former   Years: 0.50   Types: Cigarettes   Quit date: 04/01/1983   Years since quitting: 38.7  Smokeless Tobacco Never  Tobacco Comments   only smoke 2- cigs per day when he smoked    Current Outpatient Medications on File Prior to Visit  Medication Sig Dispense Refill   acetaminophen (TYLENOL) 500 MG tablet Take 500 mg by mouth every 6 (six) hours as needed for mild pain.     allopurinol (ZYLOPRIM) 300 MG tablet TAKE 1 TABLET BY MOUTH EVERY DAY 90 tablet 3   atorvastatin (LIPITOR) 40 MG tablet TAKE 1 TABLET BY MOUTH EVERY DAY 90 tablet 3   Cholecalciferol (VITAMIN D) 2000 UNITS CAPS Take 2,000 Units by mouth daily.     Clobetasol Propionate 0.05 % lotion APPLY TO AFFECTED AREA TWICE A DAY AS NEEDED     Coenzyme Q10 (CO Q-10) 200 MG CAPS Take 1 capsule by mouth daily. 30 each 1   desonide (DESOWEN) 0.05 % cream APPLY TO AFECTED AREA TWICE A DAY AS NEEDED  3   doxycycline (VIBRA-TABS) 100 MG tablet Take 1 tablet (100 mg total) by mouth 2 (two) times daily. With food 20 tablet 0   esomeprazole (NEXIUM) 20 MG capsule Take 1 capsule (20 mg total) by mouth daily  before breakfast. 90 capsule 3   Fluocinolone Acetonide Scalp 0.01 % OIL APPLY TO SCALP AT BEDTIME, WASH OUT WHEN WAKE UP  3   furosemide (LASIX) 20 MG tablet Take 1 tablet (20 mg total) by mouth daily. In the am leg edema 30 tablet 1   gabapentin (NEURONTIN) 600 MG tablet Take 1 tablet (600 mg total) by mouth 2 (two) times daily. 180 tablet 1   ketoconazole (NIZORAL) 2 % cream Apply 1 application topically daily. 15 g 0   Oxycodone HCl 10 MG TABS Take by mouth.     XARELTO 20 MG TABS tablet TAKE 1 TABLET BY MOUTH DAILY WITH SUPPER. 90 tablet 3   No current facility-administered medications on file prior to visit.     ROS see history of present illness  Objective  Physical Exam Vitals:   01/03/22 1416  BP: 125/80  Pulse: 74  Temp: 97.9 F (36.6 C)  SpO2: 97%    BP Readings from Last 3 Encounters:  01/03/22 125/80  12/31/21 (!) 171/74  12/24/21 120/76   Wt Readings from Last 3 Encounters:  01/03/22 (!) 303 lb 6.4 oz (137.6 kg)  12/12/21 299 lb (135.6 kg)  10/07/21 299  lb (135.6 kg)    Physical Exam Constitutional:      General: He is not in acute distress.    Appearance: He is not diaphoretic.  Cardiovascular:     Rate and Rhythm: Normal rate and regular rhythm.     Heart sounds: Normal heart sounds.  Pulmonary:     Effort: Pulmonary effort is normal.     Breath sounds: Normal breath sounds.  Musculoskeletal:     Comments: Unna boots in place on bilateral lower extremities  Skin:    General: Skin is warm and dry.  Neurological:     Mental Status: He is alert.      Assessment/Plan: Please see individual problem list.  Problem List Items Addressed This Visit     GERD (gastroesophageal reflux disease) - Primary (Chronic)    This is a long-term issue.  Discussed the potential need for an endoscopy given how long he has had reflux.  Refer to GI to discuss this.  He will continue Nexium 20 mg daily.      Relevant Orders   Ambulatory referral to  Gastroenterology   Hyperlipidemia (Chronic)    Continue Lipitor 40 mg daily.      Bilateral lower extremity edema    Likely venous insufficiency.  He does not have any symptoms consistent with heart failure.  His lab work did not reveal any other cause.  We will check a BMP today given that he is been taking Lasix daily.  Discussed he may not even need the Lasix given that this does not seem to be heart failure related.  He will continue to follow with vascular surgery for his Unna boots.      Relevant Orders   Basic Metabolic Panel (BMET)   Other Visit Diagnoses     Need for immunization against influenza       Relevant Orders   Flu Vaccine QUAD 72mo+IM (Fluarix, Fluzone & Alfiuria Quad PF) (Completed)       Return in about 6 months (around 07/05/2022).   [name], MD [place]

## 2022-01-03 NOTE — Assessment & Plan Note (Addendum)
Likely venous insufficiency.  He does not have any symptoms consistent with heart failure.  His lab work did not reveal any other cause.  We will check a BMP today given that he is been taking Lasix daily.  Discussed he may not even need the Lasix given that this does not seem to be heart failure related.  He will continue to follow with vascular surgery for his Unna boots.

## 2022-01-03 NOTE — Assessment & Plan Note (Signed)
-  Continue Lipitor 40 mg daily ?

## 2022-01-03 NOTE — Assessment & Plan Note (Signed)
This is a long-term issue.  Discussed the potential need for an endoscopy given how long he has had reflux.  Refer to GI to discuss this.  He will continue Nexium 20 mg daily.

## 2022-01-03 NOTE — Patient Instructions (Signed)
Nice to see you. GI should contact you to det up an appointment.  We will contact you with your lab results.

## 2022-01-04 LAB — BASIC METABOLIC PANEL
BUN: 18 mg/dL (ref 7–25)
CO2: 27 mmol/L (ref 20–32)
Calcium: 9.5 mg/dL (ref 8.6–10.3)
Chloride: 104 mmol/L (ref 98–110)
Creat: 1.05 mg/dL (ref 0.70–1.35)
Glucose, Bld: 91 mg/dL (ref 65–99)
Potassium: 4.1 mmol/L (ref 3.5–5.3)
Sodium: 142 mmol/L (ref 135–146)

## 2022-01-05 ENCOUNTER — Encounter (INDEPENDENT_AMBULATORY_CARE_PROVIDER_SITE_OTHER): Payer: Self-pay | Admitting: Nurse Practitioner

## 2022-01-06 DIAGNOSIS — M25512 Pain in left shoulder: Secondary | ICD-10-CM | POA: Diagnosis not present

## 2022-01-06 DIAGNOSIS — Z79891 Long term (current) use of opiate analgesic: Secondary | ICD-10-CM | POA: Diagnosis not present

## 2022-01-06 DIAGNOSIS — Z5181 Encounter for therapeutic drug level monitoring: Secondary | ICD-10-CM | POA: Diagnosis not present

## 2022-01-06 DIAGNOSIS — G8929 Other chronic pain: Secondary | ICD-10-CM | POA: Diagnosis not present

## 2022-01-07 ENCOUNTER — Ambulatory Visit (INDEPENDENT_AMBULATORY_CARE_PROVIDER_SITE_OTHER): Payer: Medicare PPO | Admitting: Nurse Practitioner

## 2022-01-07 DIAGNOSIS — I83019 Varicose veins of right lower extremity with ulcer of unspecified site: Secondary | ICD-10-CM

## 2022-01-07 DIAGNOSIS — L03119 Cellulitis of unspecified part of limb: Secondary | ICD-10-CM

## 2022-01-07 NOTE — Progress Notes (Unsigned)
History of Present Illness  There is no documented history at this time  Assessments & Plan   There are no diagnoses linked to this encounter.    Additional instructions  Subjective:  Patient presents with venous ulcer of the Bilateral lower extremity.    Procedure:  3 layer unna wrap was placed Bilateral lower extremity.   Plan:   Follow up in one week.  

## 2022-01-08 ENCOUNTER — Encounter (INDEPENDENT_AMBULATORY_CARE_PROVIDER_SITE_OTHER): Payer: Self-pay | Admitting: Nurse Practitioner

## 2022-01-13 ENCOUNTER — Telehealth: Payer: Self-pay

## 2022-01-13 NOTE — Telephone Encounter (Signed)
Call both home and cell of pt. Left message for pt to return call to [phone]

## 2022-01-14 ENCOUNTER — Ambulatory Visit (INDEPENDENT_AMBULATORY_CARE_PROVIDER_SITE_OTHER): Payer: Medicare PPO | Admitting: Nurse Practitioner

## 2022-01-14 ENCOUNTER — Encounter (INDEPENDENT_AMBULATORY_CARE_PROVIDER_SITE_OTHER): Payer: Self-pay | Admitting: Nurse Practitioner

## 2022-01-14 VITALS — BP 150/91 | HR 63 | Resp 16 | Ht 77.0 in | Wt 303.0 lb

## 2022-01-14 DIAGNOSIS — I890 Lymphedema, not elsewhere classified: Secondary | ICD-10-CM

## 2022-01-14 DIAGNOSIS — L03119 Cellulitis of unspecified part of limb: Secondary | ICD-10-CM

## 2022-01-14 DIAGNOSIS — Z1211 Encounter for screening for malignant neoplasm of colon: Secondary | ICD-10-CM | POA: Diagnosis not present

## 2022-01-20 ENCOUNTER — Encounter (INDEPENDENT_AMBULATORY_CARE_PROVIDER_SITE_OTHER): Payer: Self-pay | Admitting: Nurse Practitioner

## 2022-01-20 NOTE — Progress Notes (Signed)
Subjective:    Patient ID: Matthew Juarez, male    DOB: 06/10/1960, 61 y.o.   MRN: [id number] Chief Complaint  Patient presents with   Follow-up    Unna boot review    The patient returns to the office for followup evaluation regarding leg swelling.  The swelling has improved quite a bit and the pain associated with swelling has decreased substantially. There have not been any interval development of a ulcerations or wounds.  Since the previous visit the patient has been wearing graduated compression stockings and has noted some improvement in the lymphedema. The patient has been using compression routinely morning until night.  The patient also states elevation during the day and exercise (such as walking) is being done too.        Review of Systems  Cardiovascular:  Positive for leg swelling.  All other systems reviewed and are negative.      Objective:   Physical Exam Vitals reviewed.  HENT:     Head: Normocephalic.  Cardiovascular:     Rate and Rhythm: Normal rate.  Pulmonary:     Effort: Pulmonary effort is normal.  Musculoskeletal:     Right lower leg: Edema present.     Left lower leg: Edema present.  Skin:    General: Skin is warm and dry.  Neurological:     Mental Status: He is alert and oriented to person, place, and time.     Motor: Weakness present.     Gait: Gait abnormal.  Psychiatric:        Mood and Affect: Mood normal.        Behavior: Behavior normal.        Thought Content: Thought content normal.        Judgment: Judgment normal.     BP (!) 150/91 (BP Location: Left Arm)   Pulse 63   Resp 16   Ht 6\' 5"  (1.956 m)   Wt (!) 303 lb (137.4 kg)   BMI 35.93 kg/m   Past Medical History:  Diagnosis Date   Arthritis    Cardiac arrest (HCC) 06/23/2012   Chronic back pain    COVID-19 04/19/2019   Gout    Hammer toe    Headache(784.0)    History of blood clots    Hypertension    Pulmonary embolism (HCC)    Pulmonary infarction (HCC)  07/23/2012   Overview:  Last Assessment & Plan:  With associated R heart failure and cardiac arrest.  - you could make an argument to treat him with lifelong coumadin given the severity of his PE, although the literature supports 12 months.  - he will be treated (at least) 12 months, to be followed by Dr 07/25/2012.  - will need hypercoag w/u a month after the coumadin is stopped - restart coumadin if at any incr   Transverse myelitis (HCC)     Social History   Socioeconomic History   Marital status: Married    Spouse name: Not on file   Number of children: Not on file   Years of education: Not on file   Highest education level: Not on file  Occupational History   Occupation: disability  Tobacco Use   Smoking status: Former    Years: 0.50    Types: Cigarettes    Quit date: 04/01/1983    Years since quitting: 38.8   Smokeless tobacco: Never   Tobacco comments:    only smoke 2- cigs per day when he smoked  Vaping Use   Vaping Use: Never used  Substance and Sexual Activity   Alcohol use: No    Alcohol/week: 0.0 standard drinks of alcohol   Drug use: No   Sexual activity: Not Currently  Other Topics Concern   Not on file  Social History Narrative   Married    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    From [place] went to [employer]   Social Determinants of Health   Financial Resource Strain: Low Risk  (09/27/2021)   Overall Financial Resource Strain (CARDIA)    Difficulty of Paying Living Expenses: Not hard at all  Food Insecurity: No Food Insecurity (09/27/2021)   Hunger Vital Sign    Worried About Running Out of Food in the Last Year: Never true    Ran Out of Food in the Last Year: Never true  Transportation Needs: No Transportation Needs (09/27/2021)   PRAPARE - Administrator, Civil Service (Medical): No    Lack of Transportation (Non-Medical): No  Physical Activity: Inactive (09/21/2020)   Exercise Vital Sign    Days of Exercise per Week: 0 days    Minutes of Exercise  per Session: 30 min  Stress: No Stress Concern Present (09/27/2021)   [employer] of Occupational Health - Occupational Stress Questionnaire    Feeling of Stress : Not at all  Social Connections: Unknown (09/27/2021)   Social Connection and Isolation Panel [NHANES]    Frequency of Communication with Friends and Family: Not on file    Frequency of Social Gatherings with Friends and Family: Not on file    Attends Religious Services: Not on file    Active Member of Clubs or Organizations: Not on file    Attends Banker Meetings: Not on file    Marital Status: Married  Intimate Partner Violence: Not At Risk (09/27/2021)   Humiliation, Afraid, Rape, and Kick questionnaire    Fear of Current or Ex-Partner: No    Emotionally Abused: No    Physically Abused: No    Sexually Abused: No    Past Surgical History:  Procedure Laterality Date   HAMMER TOE SURGERY     2016    Family History  Problem Relation Age of Onset   Dementia Mother    Lung cancer Maternal Uncle    Colon cancer Maternal Aunt    Lung cancer Maternal Aunt    Heart disease Neg Hx     No Known Allergies     Latest Ref Rng & Units 12/12/2021   10:17 AM 09/27/2021   12:07 PM 08/06/2021    9:55 AM  CBC  WBC 4.0 - 10.5 K/uL 4.0  8.4  4.3   Hemoglobin 13.0 - 17.0 g/dL 70.4  88.8  91.6   Hematocrit 39.0 - 52.0 % 41.7  42.8  39.7   Platelets 150.0 - 400.0 K/uL 190.0  190.0  167.0       CMP     Component Value Date/Time   NA 142 01/03/2022 1437   NA 135 (L) 04/12/2012 1723   K 4.1 01/03/2022 1437   K 4.0 04/12/2012 1723   CL 104 01/03/2022 1437   CL 104 04/12/2012 1723   CO2 27 01/03/2022 1437   CO2 26 04/12/2012 1723   GLUCOSE 91 01/03/2022 1437   GLUCOSE 90 04/12/2012 1723   BUN 18 01/03/2022 1437   BUN 14 04/12/2012 1723   CREATININE 1.05 01/03/2022 1437   CALCIUM 9.5 01/03/2022 1437  CALCIUM 9.2 04/12/2012 1723   PROT 6.4 12/12/2021 1017   PROT 7.7 04/12/2012 1723   ALBUMIN 4.2  12/12/2021 1017   ALBUMIN 4.2 04/12/2012 1723   AST 17 12/12/2021 1017   AST 29 04/12/2012 1723   ALT 15 12/12/2021 1017   ALT 32 04/12/2012 1723   ALKPHOS 106 12/12/2021 1017   ALKPHOS 100 04/12/2012 1723   BILITOT 1.7 (H) 12/12/2021 1017   BILITOT 1.0 04/12/2012 1723   GFRNONAA >60 05/17/2019 1707   GFRNONAA >60 04/12/2012 1723   GFRAA >60 05/17/2019 1707   GFRAA >60 04/12/2012 1723     No results found.     Assessment & Plan:   1. Cellulitis of lower extremity, unspecified laterality Resolved  2. Lymphedema Recommend:  No surgery or intervention at this point in time.   The Patient is CEAP [id number]  I have reviewed my discussion with the patient regarding lymphedema and why it  causes symptoms.  Patient will continue wearing graduated compression on a daily basis. The patient should put the compression on first thing in the morning and removing them in the evening. The patient should not sleep in the compression.   In addition, behavioral modification throughout the day will be continued.  This will include frequent elevation (such as in a recliner), use of over the counter pain medications as needed and exercise such as walking.  The systemic causes for chronic edema such as liver, kidney and cardiac etiologies do not appear to have significant changed over the past year.    Despite conservative treatments, at least 4 weeks including graduated compression therapy class 1 and behavioral modification including exercise and elevation the patient  has not obtained adequate control of the lymphedema.  The patient still has stage 3 lymphedema and therefore, I believe that a lymph pump should be added to improve the control of the patient's lymphedema.  Additionally, a lymph pump is warranted because it will reduce the risk of cellulitis and ulceration in the future.  Patient should follow-up in six months      Current Outpatient Medications on File Prior to Visit   Medication Sig Dispense Refill   acetaminophen (TYLENOL) 500 MG tablet Take 500 mg by mouth every 6 (six) hours as needed for mild pain.     allopurinol (ZYLOPRIM) 300 MG tablet TAKE 1 TABLET BY MOUTH EVERY DAY 90 tablet 3   atorvastatin (LIPITOR) 40 MG tablet TAKE 1 TABLET BY MOUTH EVERY DAY 90 tablet 3   Cholecalciferol (VITAMIN D) 2000 UNITS CAPS Take 2,000 Units by mouth daily.     Clobetasol Propionate 0.05 % lotion APPLY TO AFFECTED AREA TWICE A DAY AS NEEDED     Coenzyme Q10 (CO Q-10) 200 MG CAPS Take 1 capsule by mouth daily. 30 each 1   desonide (DESOWEN) 0.05 % cream APPLY TO AFECTED AREA TWICE A DAY AS NEEDED  3   esomeprazole (NEXIUM) 20 MG capsule Take 1 capsule (20 mg total) by mouth daily before breakfast. 90 capsule 3   Fluocinolone Acetonide Scalp 0.01 % OIL APPLY TO SCALP AT BEDTIME, WASH OUT WHEN WAKE UP  3   gabapentin (NEURONTIN) 600 MG tablet Take 1 tablet (600 mg total) by mouth 2 (two) times daily. 180 tablet 1   ketoconazole (NIZORAL) 2 % cream Apply 1 application topically daily. 15 g 0   Oxycodone HCl 10 MG TABS Take by mouth.     XARELTO 20 MG TABS tablet TAKE 1 TABLET BY MOUTH DAILY  WITH SUPPER. 90 tablet 3   doxycycline (VIBRA-TABS) 100 MG tablet Take 1 tablet (100 mg total) by mouth 2 (two) times daily. With food (Patient not taking: Reported on 01/14/2022) 20 tablet 0   furosemide (LASIX) 20 MG tablet TAKE 1 TABLET (20 MG TOTAL) BY MOUTH DAILY. IN THE AM LEG EDEMA (Patient not taking: Reported on 01/14/2022) 90 tablet 1   No current facility-administered medications on file prior to visit.    There are no Patient Instructions on file for this visit. No follow-ups on file.   Kris Hartmann, NP

## 2022-01-21 LAB — COLOGUARD
COLOGUARD: NEGATIVE
Cologuard: NEGATIVE

## 2022-01-27 ENCOUNTER — Encounter (INDEPENDENT_AMBULATORY_CARE_PROVIDER_SITE_OTHER): Payer: Self-pay

## 2022-02-05 ENCOUNTER — Other Ambulatory Visit: Payer: Self-pay | Admitting: Family Medicine

## 2022-03-27 ENCOUNTER — Telehealth: Payer: Self-pay | Admitting: Family Medicine

## 2022-03-27 NOTE — Telephone Encounter (Signed)
Patient dropped off handicapp form to be signed by Dr Birdie Sons. Form is up front in Dr Purvis Sheffield color folder.

## 2022-03-27 NOTE — Telephone Encounter (Signed)
Handicap form is in the sign basket, patient cannot walk 200 feet without stopping and he uses a wheelchair for assistance.   Yamina Lenis,cma

## 2022-04-01 NOTE — Telephone Encounter (Signed)
I called the patient and informed him that his handicap form is ready and available for pickup, patient stated he will pick up on Thursday at his appointment with Wynetta Emery.  Aubrey Blackard,cma

## 2022-04-01 NOTE — Telephone Encounter (Signed)
Signed. This can be placed at the front for pick up.

## 2022-04-03 ENCOUNTER — Ambulatory Visit: Payer: Medicare HMO | Admitting: Nurse Practitioner

## 2022-04-03 ENCOUNTER — Encounter: Payer: Self-pay | Admitting: Nurse Practitioner

## 2022-04-03 VITALS — BP 134/83 | HR 62 | Temp 98.4°F | Ht 77.0 in | Wt 313.4 lb

## 2022-04-03 DIAGNOSIS — J019 Acute sinusitis, unspecified: Secondary | ICD-10-CM

## 2022-04-03 MED ORDER — AMOXICILLIN-POT CLAVULANATE 875-125 MG PO TABS: 1.0000 | ORAL_TABLET | Freq: Two times a day (BID) | ORAL | 0 refills | Status: DC

## 2022-04-03 NOTE — Progress Notes (Signed)
Tomasita Morrow, NP-C Phone: [phone]  IZAN MIRON is a 62 y.o. male who presents today for cough and congestion x 5 days. Patient reports he tested negative for COVID at home. Denies chest pain, shortness of breath and fever.   Respiratory illness:  Cough- Yes, productive.   Congestion-    Sinus- Nasal congestion, yellow mucus   Chest- No  Post nasal drip- Yes  Sore throat- Yes, resolved.  Shortness of breath- No  Fever- No  Fatigue/Myalgia- No Headache- No Nausea/Vomiting- No Taste disturbance- No  Smell disturbance- No  Covid vaccination- x 2  Flu vaccination- UTD  Medications- Robitussin   Social History   Tobacco Use  Smoking Status Former   Years: 0.50   Types: Cigarettes   Quit date: 04/01/1983   Years since quitting: 39.0  Smokeless Tobacco Never  Tobacco Comments   only smoke 2- cigs per day when he smoked    Current Outpatient Medications on File Prior to Visit  Medication Sig Dispense Refill   acetaminophen (TYLENOL) 500 MG tablet Take 500 mg by mouth every 6 (six) hours as needed for mild pain.     allopurinol (ZYLOPRIM) 300 MG tablet TAKE 1 TABLET BY MOUTH EVERY DAY 90 tablet 3   atorvastatin (LIPITOR) 40 MG tablet TAKE 1 TABLET BY MOUTH EVERY DAY 90 tablet 3   Cholecalciferol (VITAMIN D) 2000 UNITS CAPS Take 2,000 Units by mouth daily.     Clobetasol Propionate 0.05 % lotion APPLY TO AFFECTED AREA TWICE A DAY AS NEEDED     Coenzyme Q10 (CO Q-10) 200 MG CAPS Take 1 capsule by mouth daily. 30 each 1   desonide (DESOWEN) 0.05 % cream APPLY TO AFECTED AREA TWICE A DAY AS NEEDED  3   esomeprazole (NEXIUM) 20 MG capsule Take 1 capsule (20 mg total) by mouth daily before breakfast. 90 capsule 3   Fluocinolone Acetonide Scalp 0.01 % OIL APPLY TO SCALP AT BEDTIME, WASH OUT WHEN WAKE UP  3   gabapentin (NEURONTIN) 600 MG tablet Take 1 tablet (600 mg total) by mouth 2 (two) times daily. 180 tablet 1   Oxycodone HCl 10 MG TABS Take by mouth.     XARELTO 20 MG  TABS tablet TAKE 1 TABLET BY MOUTH DAILY WITH SUPPER 90 tablet 3   furosemide (LASIX) 20 MG tablet TAKE 1 TABLET (20 MG TOTAL) BY MOUTH DAILY. IN THE AM LEG EDEMA (Patient not taking: Reported on 01/14/2022) 90 tablet 1   ketoconazole (NIZORAL) 2 % cream Apply 1 application topically daily. 15 g 0   No current facility-administered medications on file prior to visit.     ROS see history of present illness  Objective  Physical Exam Vitals:   04/03/22 1114 04/03/22 1115  BP: (!) 162/95 134/83  Pulse: 62   Temp: 98.4 F (36.9 C)   SpO2: 99%     BP Readings from Last 3 Encounters:  04/03/22 134/83  01/14/22 (!) 150/91  01/03/22 125/80   Wt Readings from Last 3 Encounters:  04/03/22 (!) 313 lb 6.4 oz (142.2 kg)  01/14/22 (!) 303 lb (137.4 kg)  01/03/22 (!) 303 lb 6.4 oz (137.6 kg)    Physical Exam Constitutional:      General: He is not in acute distress.    Appearance: Normal appearance.  HENT:     Head: Normocephalic.     Right Ear: Tympanic membrane normal.     Left Ear: Tympanic membrane normal.     Nose: Congestion  present.     Mouth/Throat:     Mouth: Mucous membranes are moist.     Pharynx: Oropharynx is clear. No oropharyngeal exudate or posterior oropharyngeal erythema.  Eyes:     Pupils: Pupils are equal, round, and reactive to light.  Cardiovascular:     Rate and Rhythm: Normal rate and regular rhythm.     Heart sounds: Normal heart sounds.  Pulmonary:     Effort: Pulmonary effort is normal.     Breath sounds: Normal breath sounds. No wheezing.  Neurological:     Mental Status: He is alert.    Assessment/Plan: Please see individual problem list.  Acute non-recurrent sinusitis, unspecified location Assessment & Plan: Will treat with Augmentin x 10 days. Encouraged patient to complete entire course of antibiotics and discussed side effects such as diarrhea. Advised adequate fluid intake, can use OTC nasal spray and continue Robitussin for cough.    Orders: -     Amoxicillin-Pot Clavulanate; Take 1 tablet by mouth 2 (two) times daily.  Dispense: 20 tablet; Refill: 0    Return if symptoms worsen or fail to improve.   Tomasita Morrow, NP-C [place]

## 2022-04-03 NOTE — Patient Instructions (Addendum)
It was nice to meet you!  I have sent antibiotics to the pharmacy for you.  You can get nasal spay over the counter to help with nasal congestion.

## 2022-04-03 NOTE — Assessment & Plan Note (Signed)
Will treat with Augmentin x 10 days. Encouraged patient to complete entire course of antibiotics and discussed side effects such as diarrhea. Advised adequate fluid intake, can use OTC nasal spray and continue Robitussin for cough.

## 2022-04-09 DIAGNOSIS — G8929 Other chronic pain: Secondary | ICD-10-CM | POA: Diagnosis not present

## 2022-04-09 DIAGNOSIS — M25512 Pain in left shoulder: Secondary | ICD-10-CM | POA: Diagnosis not present

## 2022-04-09 DIAGNOSIS — G360 Neuromyelitis optica [Devic]: Secondary | ICD-10-CM | POA: Diagnosis not present

## 2022-04-09 DIAGNOSIS — G894 Chronic pain syndrome: Secondary | ICD-10-CM | POA: Diagnosis not present

## 2022-04-21 DIAGNOSIS — L218 Other seborrheic dermatitis: Secondary | ICD-10-CM | POA: Diagnosis not present

## 2022-04-26 DIAGNOSIS — G360 Neuromyelitis optica [Devic]: Secondary | ICD-10-CM | POA: Diagnosis not present

## 2022-05-09 ENCOUNTER — Other Ambulatory Visit: Payer: Self-pay

## 2022-05-12 DIAGNOSIS — Z5181 Encounter for therapeutic drug level monitoring: Secondary | ICD-10-CM | POA: Diagnosis not present

## 2022-05-12 DIAGNOSIS — G360 Neuromyelitis optica [Devic]: Secondary | ICD-10-CM | POA: Diagnosis not present

## 2022-05-12 DIAGNOSIS — R269 Unspecified abnormalities of gait and mobility: Secondary | ICD-10-CM | POA: Diagnosis not present

## 2022-05-12 DIAGNOSIS — Z79899 Other long term (current) drug therapy: Secondary | ICD-10-CM | POA: Diagnosis not present

## 2022-05-15 ENCOUNTER — Encounter: Payer: Self-pay | Admitting: Gastroenterology

## 2022-05-15 ENCOUNTER — Ambulatory Visit: Payer: Medicare HMO | Admitting: Gastroenterology

## 2022-05-15 VITALS — BP 119/73 | HR 59 | Temp 98.3°F | Ht 77.0 in | Wt 313.0 lb

## 2022-05-15 DIAGNOSIS — K219 Gastro-esophageal reflux disease without esophagitis: Secondary | ICD-10-CM | POA: Diagnosis not present

## 2022-05-15 NOTE — Progress Notes (Signed)
Matthew Bellows MD, MRCP(U.K) [address]  [place]  [place], [place] [address]  Main: [phone]  Fax: [phone]   Gastroenterology Consultation  Referring Provider:     Leone Haven, MD Primary Care Physician:  Leone Haven, MD Primary Gastroenterologist:  Dr. Jonathon Juarez  Reason for Consultation:     GERD        HPI:   Matthew Juarez is a 62 y.o. y/o male referred for consultation & management  by Dr. Caryl Bis, Angela Adam, MD.    He states he has had issues with acid reflux for over 15 to 20 years.  Has 1-2 episodes a week.  Usually when he lays flat at nighttime.  Developed heartburn.  Takes Nexium 20 mg once a day first thing in the morning on an empty stomach.  Does not seem to have many concerns about his acid reflux no difficulty swallowing.   Past Medical History:  Diagnosis Date   Arthritis    Cardiac arrest ([facility]) 06/23/2012   Chronic back pain    COVID-19 04/19/2019   Gout    Hammer toe    Headache(784.0)    History of blood clots    Hypertension    Pulmonary embolism ([facility])    Pulmonary infarction ([facility]) 07/23/2012   Overview:  Last Assessment & Plan:  With associated R heart failure and cardiac arrest.  - you could make an argument to treat him with lifelong coumadin given the severity of his PE, although the literature supports 12 months.  - he will be treated (at least) 12 months, to be followed by Dr Karlton Lemon.  - will need hypercoag w/u a month after the coumadin is stopped - restart coumadin if at any incr   Transverse myelitis [facility])     Past Surgical History:  Procedure Laterality Date   HAMMER TOE SURGERY     2016    Prior to Admission medications   Medication Sig Start Date End Date Taking? Authorizing Provider  acetaminophen (TYLENOL) 500 MG tablet Take 500 mg by mouth every 6 (six) hours as needed for mild pain.    [provider]  allopurinol (ZYLOPRIM) 300 MG tablet TAKE 1 TABLET BY MOUTH EVERY DAY 12/25/21   Leone Haven, MD  amoxicillin-clavulanate (AUGMENTIN) 875-125 MG tablet Take 1 tablet by mouth 2 (two) times daily. 04/03/22   Tomasita Morrow, NP  atorvastatin (LIPITOR) 40 MG tablet TAKE 1 TABLET BY MOUTH EVERY DAY 12/25/21   Leone Haven, MD  Cholecalciferol (VITAMIN D) 2000 UNITS CAPS Take 2,000 Units by mouth daily.    [provider]  Clobetasol Propionate 0.05 % lotion APPLY TO AFFECTED AREA TWICE A DAY AS NEEDED 10/20/16   [provider]  Coenzyme Q10 (CO Q-10) 200 MG CAPS Take 1 capsule by mouth daily. 05/11/12   Angiulli, Lavon Paganini, PA-C  desonide (DESOWEN) 0.05 % cream APPLY TO AFECTED AREA TWICE A DAY AS NEEDED 05/21/17   [provider]  esomeprazole (NEXIUM) 20 MG capsule Take 1 capsule (20 mg total) by mouth daily before breakfast. 10/23/16   Leone Haven, MD  Fluocinolone Acetonide Scalp 0.01 % OIL APPLY TO SCALP AT BEDTIME, [place] OUT WHEN WAKE UP 05/21/17   [provider]  gabapentin (NEURONTIN) 600 MG tablet Take 1 tablet (600 mg total) by mouth 2 (two) times daily. 06/13/20   Leone Haven, MD  oxyCODONE (OXY IR/ROXICODONE) 5 MG immediate release tablet Take 5 mg by  mouth every 6 (six) hours as needed. 04/09/22   [provider]  XARELTO 20 MG TABS tablet TAKE 1 TABLET BY MOUTH DAILY WITH SUPPER 02/06/22   Leone Haven, MD    Family History  Problem Relation Age of Onset   Dementia Mother    Lung cancer Maternal Uncle    Colon cancer Maternal Aunt    Lung cancer Maternal Aunt    Heart disease Neg Hx      Social History   Tobacco Use   Smoking status: Former    Years: 0.50    Types: Cigarettes    Quit date: 04/01/1983    Years since quitting: 39.1   Smokeless tobacco: Never   Tobacco comments:    only smoke 2- cigs per day when he smoked  Vaping Use   Vaping Use: Never used  Substance Use Topics   Alcohol use: No    Alcohol/week: 0.0 standard drinks of alcohol   Drug use: No    Allergies as of 05/15/2022   (No  Known Allergies)    Review of Systems:    All systems reviewed and negative except where noted in HPI.   Physical Exam:  BP 119/73   Pulse (!) 59   Temp 98.3 F (36.8 C) (Oral)   Ht 6' 5"$  (1.956 m)   Wt (!) 313 lb (142 kg) Comment: Patient on a wheel chair  BMI 37.12 kg/m  No LMP for male patient. Psych:  Alert and cooperative. Normal mood and affect. General:   Alert,  Well-developed, well-nourished, pleasant and cooperative in NAD Head:  Normocephalic and atraumatic. Eyes:  Sclera clear, no icterus.   Conjunctiva pink. Ears:  Normal auditory acuity. Neurologic:  Alert and oriented x3;  grossly normal neurologically. Psych:  Alert and cooperative. Normal mood and affect.  Imaging Studies: No results found.  Assessment and Plan:   Matthew Juarez is a 62 y.o. y/o male has been referred for GERD.  Longstanding for 15 to 20 years symptoms well-controlled on 20 mg of omeprazole.  Plan 1.  We discussed about lifestyle measures including avoid eating food for 2 to 3 hours before bedtime, using a wedge pillow at night, using smaller portions of food at nighttime.  2.  I discussed that since his symptoms are very seldom if he does the above lifestyle changes and continues to try to lose weight he could try going off his omeprazole completely to see if his symptoms stay away.  I explained to him that this is needed to see if we can get him off the medications because all medications have side effects and the best option is always to with the least amount of medication.  We discussed about screening for Barrett's esophagus he is not keen at this point of time  Follow up in as needed  Dr Matthew Bellows MD,MRCP(U.K)

## 2022-05-15 NOTE — Patient Instructions (Addendum)
Pease purchase a wedge pillow on Amazon to help you with your acid reflux.   Food Choices for Gastroesophageal Reflux Disease, Adult When you have gastroesophageal reflux disease (GERD), the foods you eat and your eating habits are very important. Choosing the right foods can help ease your discomfort. Think about working with a food expert (dietitian) to help you make good choices. What are tips for following this plan? Reading food labels Look for foods that are low in saturated fat. Foods that may help with your symptoms include: Foods that have less than 5% of daily value (DV) of fat. Foods that have 0 grams of trans fat. Cooking Do not fry your food. Cook your food by baking, steaming, grilling, or broiling. These are all methods that do not need a lot of fat for cooking. To add flavor, try to use herbs that are low in spice and acidity. Meal planning  Choose healthy foods that are low in fat, such as: Fruits and vegetables. Whole grains. Low-fat dairy products. Lean meats, fish, and poultry. Eat small meals often instead of eating 3 large meals each day. Eat your meals slowly in a place where you are relaxed. Avoid bending over or lying down until 2-3 hours after eating. Limit high-fat foods such as fatty meats or fried foods. Limit your intake of fatty foods, such as oils, butter, and shortening. Avoid the following as told by your doctor: Foods that cause symptoms. These may be different for different people. Keep a food diary to keep track of foods that cause symptoms. Alcohol. Drinking a lot of liquid with meals. Eating meals during the 2-3 hours before bed. Lifestyle Stay at a healthy weight. Ask your doctor what weight is healthy for you. If you need to lose weight, work with your doctor to do so safely. Exercise for at least 30 minutes on 5 or more days each week, or as told by your doctor. Wear loose-fitting clothes. Do not smoke or use any products that contain  nicotine or tobacco. If you need help quitting, ask your doctor. Sleep with the head of your bed higher than your feet. Use a wedge under the mattress or blocks under the bed frame to raise the head of the bed. Chew sugar-free gum after meals. What foods should eat?  Eat a healthy, well-balanced diet of fruits, vegetables, whole grains, low-fat dairy products, lean meats, fish, and poultry. Each person is different. Foods that may cause symptoms in one person may not cause any symptoms in another person. Work with your doctor to find foods that are safe for you. The items listed above may not be a complete list of what you can eat and drink. Contact a food expert for more options. What foods should I avoid? Limiting some of these foods may help in managing the symptoms of GERD. Everyone is different. Talk with a food expert or your doctor to help you find the exact foods to avoid, if any. Fruits Any fruits prepared with added fat. Any fruits that cause symptoms. For some people, this may include citrus fruits, such as oranges, grapefruit, pineapple, and lemons. Vegetables Deep-fried vegetables. [place] fries. Any vegetables prepared with added fat. Any vegetables that cause symptoms. For some people, this may include tomatoes and tomato products, chili peppers, onions and garlic, and horseradish. Grains Pastries or quick breads with added fat. Meats and other proteins High-fat meats, such as fatty beef or pork, hot dogs, ribs, ham, sausage, salami, and bacon. Fried meat or  protein, including fried fish and fried chicken. Nuts and nut butters, in large amounts. Dairy Whole milk and chocolate milk. Sour cream. Cream. Ice cream. Cream cheese. Milkshakes. Fats and oils Butter. Margarine. Shortening. Ghee. Beverages Coffee and tea, with or without caffeine. Carbonated beverages. Sodas. Energy drinks. Fruit juice made with acidic fruits, such as orange or grapefruit. Tomato juice. Alcoholic  drinks. Sweets and desserts Chocolate and cocoa. Donuts. Seasonings and condiments Pepper. Peppermint and spearmint. Added salt. Any condiments, herbs, or seasonings that cause symptoms. For some people, this may include curry, hot sauce, or vinegar-based salad dressings. The items listed above may not be a complete list of what you should not eat and drink. Contact a food expert for more options. Questions to ask your doctor Diet and lifestyle changes are often the first steps that are taken to manage symptoms of GERD. If diet and lifestyle changes do not help, talk with your doctor about taking medicines. Where to find more information International Foundation for Gastrointestinal Disorders: aboutgerd.org Summary When you have GERD, food and lifestyle choices are very important in easing your symptoms. Eat small meals often instead of 3 large meals a day. Eat your meals slowly and in a place where you are relaxed. Avoid bending over or lying down until 2-3 hours after eating. Limit high-fat foods such as fatty meats or fried foods. This information is not intended to replace advice given to you by your health care provider. Make sure you discuss any questions you have with your health care provider. Document Revised: 09/26/2019 Document Reviewed: 09/26/2019 Elsevier Patient Education  [place].

## 2022-07-07 ENCOUNTER — Ambulatory Visit (INDEPENDENT_AMBULATORY_CARE_PROVIDER_SITE_OTHER): Payer: Medicare HMO | Admitting: Family Medicine

## 2022-07-07 ENCOUNTER — Encounter: Payer: Self-pay | Admitting: Family Medicine

## 2022-07-07 VITALS — BP 128/76 | HR 56 | Temp 97.9°F | Ht 77.0 in | Wt 308.0 lb

## 2022-07-07 DIAGNOSIS — G360 Neuromyelitis optica [Devic]: Secondary | ICD-10-CM

## 2022-07-07 DIAGNOSIS — E785 Hyperlipidemia, unspecified: Secondary | ICD-10-CM

## 2022-07-07 DIAGNOSIS — Z86711 Personal history of pulmonary embolism: Secondary | ICD-10-CM | POA: Diagnosis not present

## 2022-07-07 DIAGNOSIS — K219 Gastro-esophageal reflux disease without esophagitis: Secondary | ICD-10-CM | POA: Diagnosis not present

## 2022-07-07 LAB — HEPATIC FUNCTION PANEL
ALT: 15 U/L (ref 0–53)
AST: 15 U/L (ref 0–37)
Albumin: 4.2 g/dL (ref 3.5–5.2)
Alkaline Phosphatase: 104 U/L (ref 39–117)
Bilirubin, Direct: 0.3 mg/dL (ref 0.0–0.3)
Total Bilirubin: 1.4 mg/dL — ABNORMAL HIGH (ref 0.2–1.2)
Total Protein: 5.8 g/dL — ABNORMAL LOW (ref 6.0–8.3)

## 2022-07-07 LAB — LIPID PANEL
Cholesterol: 124 mg/dL (ref 0–200)
HDL: 41.1 mg/dL (ref >39.00)
LDL Cholesterol: 62 mg/dL (ref 0–99)
NonHDL: 83.05
Total CHOL/HDL Ratio: 3
Triglycerides: 103 mg/dL (ref 0.0–149.0)
VLDL: 20.6 mg/dL (ref 0.0–40.0)

## 2022-07-07 NOTE — Assessment & Plan Note (Signed)
Chronic issue.  Patient will continue to follow with neurology.

## 2022-07-07 NOTE — Assessment & Plan Note (Signed)
Chronic issue.  Continue Xarelto 20 mg daily.

## 2022-07-07 NOTE — Assessment & Plan Note (Signed)
Chronic issue.  Asymptomatic.  He has seen GI.  He will continue Nexium 20 mg daily.  He will continue his wedge pillow.

## 2022-07-07 NOTE — Assessment & Plan Note (Signed)
Chronic issue.  Continue Lipitor 40 mg daily.  Check lipid panel.

## 2022-07-07 NOTE — Progress Notes (Signed)
Matthew Alar, MD Phone: [phone]  Matthew Juarez is a 62 y.o. male who presents today for f/u.  History of pulmonary embolism: Patient continues on Xarelto.  No chest pain, swelling, or shortness of breath.  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  He notes no change to chronic weakness.  He notes no numbness.  He saw his neurologist in February and he continues on rituximab every 4 months.  GERD: Patient saw GI.  It appears they discussed an EGD for Barrett's esophagus screening though the note notes the patient was not seen.  He started using a wedge pillow which has been beneficial.  He continues on omeprazole.  No reflux, abdominal pain, blood in stool, or dysphagia.  Hyperlipidemia: Patient continues on Lipitor.  Lab work due today.  Social History   Tobacco Use  Smoking Status Former   Years: .5   Types: Cigarettes   Quit date: 04/01/1983   Years since quitting: 39.2  Smokeless Tobacco Never  Tobacco Comments   only smoke 2- cigs per day when he smoked    Current Outpatient Medications on File Prior to Visit  Medication Sig Dispense Refill   acetaminophen (TYLENOL) 500 MG tablet Take 500 mg by mouth every 6 (six) hours as needed for mild pain.     allopurinol (ZYLOPRIM) 300 MG tablet TAKE 1 TABLET BY MOUTH EVERY DAY 90 tablet 3   atorvastatin (LIPITOR) 40 MG tablet TAKE 1 TABLET BY MOUTH EVERY DAY 90 tablet 3   Cholecalciferol (VITAMIN D) 2000 UNITS CAPS Take 2,000 Units by mouth daily.     Clobetasol Propionate 0.05 % lotion APPLY TO AFFECTED AREA TWICE A DAY AS NEEDED     Coenzyme Q10 (CO Q-10) 200 MG CAPS Take 1 capsule by mouth daily. 30 each 1   desonide (DESOWEN) 0.05 % cream APPLY TO AFECTED AREA TWICE A DAY AS NEEDED  3   esomeprazole (NEXIUM) 20 MG capsule Take 1 capsule (20 mg total) by mouth daily before breakfast. 90 capsule 3   Fluocinolone Acetonide Scalp 0.01 % OIL APPLY TO SCALP AT BEDTIME, WASH OUT WHEN WAKE UP  3    gabapentin (NEURONTIN) 600 MG tablet Take 1 tablet (600 mg total) by mouth 2 (two) times daily. 180 tablet 1   oxyCODONE (OXY IR/ROXICODONE) 5 MG immediate release tablet Take 5 mg by mouth every 6 (six) hours as needed.     XARELTO 20 MG TABS tablet TAKE 1 TABLET BY MOUTH DAILY WITH SUPPER 90 tablet 3   No current facility-administered medications on file prior to visit.     ROS see history of present illness  Objective  Physical Exam Vitals:   07/07/22 0920  BP: 128/76  Pulse: (!) 56  Temp: 97.9 F (36.6 C)  SpO2: 99%    BP Readings from Last 3 Encounters:  07/07/22 128/76  05/15/22 119/73  04/03/22 134/83   Wt Readings from Last 3 Encounters:  07/07/22 (!) 308 lb (139.7 kg)  05/15/22 (!) 313 lb (142 kg)  04/03/22 (!) 313 lb 6.4 oz (142.2 kg)    Physical Exam Constitutional:      General: He is not in acute distress.    Appearance: He is not diaphoretic.  Cardiovascular:     Rate and Rhythm: Normal rate and regular rhythm.     Heart sounds: Normal heart sounds.  Pulmonary:     Effort: Pulmonary effort is normal.     Breath sounds: Normal breath sounds.  Musculoskeletal:  Right lower leg: No edema.     Left lower leg: No edema.  Skin:    General: Skin is warm and dry.  Neurological:     Mental Status: He is alert.      Assessment/Plan: Please see individual problem list.  Gastroesophageal reflux disease, unspecified whether esophagitis present Assessment & Plan: Chronic issue.  Asymptomatic.  He has seen GI.  He will continue Nexium 20 mg daily.  He will continue his wedge pillow.   Hyperlipidemia, unspecified hyperlipidemia type Assessment & Plan: Chronic issue.  Continue Lipitor 40 mg daily.  Check lipid panel.  Orders: -     Lipid panel -     Hepatic function panel  Neuromyelitis optica ([facility]) Assessment & Plan: Chronic issue.  Patient will continue to follow with neurology.   History of pulmonary embolism Assessment & Plan: Chronic  issue.  Continue Xarelto 20 mg daily.      Health Maintenance: We will try to track down the patient's Cologuard results as he reports he had that completed last year.  Return in about 6 months (around 01/06/2023).   Matthew Alar, MD [facility]

## 2022-07-09 DIAGNOSIS — Z79891 Long term (current) use of opiate analgesic: Secondary | ICD-10-CM | POA: Diagnosis not present

## 2022-07-09 DIAGNOSIS — M25512 Pain in left shoulder: Secondary | ICD-10-CM | POA: Diagnosis not present

## 2022-07-09 DIAGNOSIS — G894 Chronic pain syndrome: Secondary | ICD-10-CM | POA: Diagnosis not present

## 2022-07-09 DIAGNOSIS — Z5181 Encounter for therapeutic drug level monitoring: Secondary | ICD-10-CM | POA: Diagnosis not present

## 2022-07-09 DIAGNOSIS — G8929 Other chronic pain: Secondary | ICD-10-CM | POA: Diagnosis not present

## 2022-07-16 ENCOUNTER — Ambulatory Visit (INDEPENDENT_AMBULATORY_CARE_PROVIDER_SITE_OTHER): Payer: Medicare PPO | Admitting: Nurse Practitioner

## 2022-07-25 ENCOUNTER — Ambulatory Visit (INDEPENDENT_AMBULATORY_CARE_PROVIDER_SITE_OTHER): Payer: Medicare HMO | Admitting: Nurse Practitioner

## 2022-07-25 ENCOUNTER — Encounter (INDEPENDENT_AMBULATORY_CARE_PROVIDER_SITE_OTHER): Payer: Self-pay | Admitting: Nurse Practitioner

## 2022-07-25 VITALS — BP 142/84 | HR 64 | Resp 16

## 2022-07-25 DIAGNOSIS — I890 Lymphedema, not elsewhere classified: Secondary | ICD-10-CM | POA: Diagnosis not present

## 2022-07-25 DIAGNOSIS — E785 Hyperlipidemia, unspecified: Secondary | ICD-10-CM

## 2022-08-11 ENCOUNTER — Encounter (INDEPENDENT_AMBULATORY_CARE_PROVIDER_SITE_OTHER): Payer: Self-pay | Admitting: Nurse Practitioner

## 2022-08-11 NOTE — Progress Notes (Signed)
Subjective:    Patient ID: Matthew Juarez, male    DOB: September 27, 1960, 62 y.o.   MRN: [id number] Chief Complaint  Patient presents with   Follow-up    6 month follow up    The patient returns to the office for followup evaluation regarding leg swelling.  The swelling has improved quite a bit and the pain associated with swelling has decreased substantially. There have not been any interval development of a ulcerations or wounds.  Since the previous visit the patient has been wearing graduated compression stockings and has noted some improvement in the lymphedema. The patient has been using compression routinely morning until night.  The patient also states elevation during the day        Review of Systems  Cardiovascular:  Positive for leg swelling.  Neurological:  Positive for weakness.  All other systems reviewed and are negative.      Objective:   Physical Exam Vitals reviewed.  HENT:     Head: Normocephalic.  Cardiovascular:     Rate and Rhythm: Normal rate.  Pulmonary:     Effort: Pulmonary effort is normal.  Skin:    General: Skin is warm and dry.  Neurological:     Mental Status: He is alert and oriented to person, place, and time.     Motor: Weakness present.  Psychiatric:        Mood and Affect: Mood normal.        Behavior: Behavior normal.        Thought Content: Thought content normal.        Judgment: Judgment normal.     BP (!) 142/84 (BP Location: Left Arm)   Pulse 64   Resp 16   Past Medical History:  Diagnosis Date   Arthritis    Cardiac arrest ([facility]) 06/23/2012   Chronic back pain    COVID-19 04/19/2019   Gout    Hammer toe    Headache(784.0)    History of blood clots    Hypertension    Pulmonary embolism ([facility])    Pulmonary infarction ([facility]) 07/23/2012   Overview:  Last Assessment & Plan:  With associated R heart failure and cardiac arrest.  - you could make an argument to treat him with lifelong coumadin given the severity of his PE,  although the literature supports 12 months.  - he will be treated (at least) 12 months, to be followed by Dr Renae Gloss.  - will need hypercoag w/u a month after the coumadin is stopped - restart coumadin if at any incr   Transverse myelitis ([facility])     Social History   Socioeconomic History   Marital status: Married    Spouse name: Not on file   Number of children: Not on file   Years of education: Not on file   Highest education level: 12th grade  Occupational History   Occupation: disability  Tobacco Use   Smoking status: Former    Years: .5    Types: Cigarettes    Quit date: 04/01/1983    Years since quitting: 39.3   Smokeless tobacco: Never   Tobacco comments:    only smoke 2- cigs per day when he smoked  Vaping Use   Vaping Use: Never used  Substance and Sexual Activity   Alcohol use: No    Alcohol/week: 0.0 standard drinks of alcohol   Drug use: No   Sexual activity: Not Currently  Other Topics Concern   Not on file  Social History  Narrative   Married    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    From [place] went to [employer]   Social Determinants of Health   Financial Resource Strain: Low Risk  (07/04/2022)   Overall Financial Resource Strain (CARDIA)    Difficulty of Paying Living Expenses: Not hard at all  Food Insecurity: No Food Insecurity (07/04/2022)   Hunger Vital Sign    Worried About Running Out of Food in the Last Year: Never true    Ran Out of Food in the Last Year: Never true  Transportation Needs: No Transportation Needs (07/04/2022)   PRAPARE - Administrator, Civil Service (Medical): No    Lack of Transportation (Non-Medical): No  Physical Activity: Unknown (07/04/2022)   Exercise Vital Sign    Days of Exercise per Week: 0 days    Minutes of Exercise per Session: Not on file  Stress: No Stress Concern Present (07/04/2022)   [employer] of Occupational Health - Occupational Stress Questionnaire    Feeling of Stress : Not at all  Social  Connections: Unknown (07/04/2022)   Social Connection and Isolation Panel [NHANES]    Frequency of Communication with Friends and Family: Three times a week    Frequency of Social Gatherings with Friends and Family: Three times a week    Attends Religious Services: More than 4 times per year    Active Member of Clubs or Organizations: Not on file    Attends Club or Organization Meetings: Not on file    Marital Status: Married  Intimate Partner Violence: Not At Risk (09/27/2021)   Humiliation, Afraid, Rape, and Kick questionnaire    Fear of Current or Ex-Partner: No    Emotionally Abused: No    Physically Abused: No    Sexually Abused: No    Past Surgical History:  Procedure Laterality Date   HAMMER TOE SURGERY     2016    Family History  Problem Relation Age of Onset   Dementia Mother    Lung cancer Maternal Uncle    Colon cancer Maternal Aunt    Lung cancer Maternal Aunt    Heart disease Neg Hx     No Known Allergies     Latest Ref Rng & Units 12/12/2021   10:17 AM 09/27/2021   12:07 PM 08/06/2021    9:55 AM  CBC  WBC 4.0 - 10.5 K/uL 4.0  8.4  4.3   Hemoglobin 13.0 - 17.0 g/dL 95.6  21.3  08.6   Hematocrit 39.0 - 52.0 % 41.7  42.8  39.7   Platelets 150.0 - 400.0 K/uL 190.0  190.0  167.0       CMP     Component Value Date/Time   NA 142 01/03/2022 1437   NA 135 (L) 04/12/2012 1723   K 4.1 01/03/2022 1437   K 4.0 04/12/2012 1723   CL 104 01/03/2022 1437   CL 104 04/12/2012 1723   CO2 27 01/03/2022 1437   CO2 26 04/12/2012 1723   GLUCOSE 91 01/03/2022 1437   GLUCOSE 90 04/12/2012 1723   BUN 18 01/03/2022 1437   BUN 14 04/12/2012 1723   CREATININE 1.05 01/03/2022 1437   CALCIUM 9.5 01/03/2022 1437   CALCIUM 9.2 04/12/2012 1723   PROT 5.8 (L) 07/07/2022 0939   PROT 7.7 04/12/2012 1723   ALBUMIN 4.2 07/07/2022 0939   ALBUMIN 4.2 04/12/2012 1723   AST 15 07/07/2022 0939   AST 29 04/12/2012 1723  ALT 15 07/07/2022 0939   ALT 32 04/12/2012 1723   ALKPHOS 104  07/07/2022 0939   ALKPHOS 100 04/12/2012 1723   BILITOT 1.4 (H) 07/07/2022 0939   BILITOT 1.0 04/12/2012 1723   GFRNONAA >60 05/17/2019 1707   GFRNONAA >60 04/12/2012 1723   GFRAA >60 05/17/2019 1707   GFRAA >60 04/12/2012 1723     No results found.     Assessment & Plan:   1. Lymphedema Recommend:  No surgery or intervention at this point in time.    I have reviewed my previous discussion with the patient regarding swelling and why it  causes symptoms.  The patient is doing well with compression and will continue wearing graduated compression on a daily basis. The patient will  continue wearing the compression first thing in the morning and removing them in the evening. The patient is instructed specifically not to sleep in the compression.    In addition, behavioral modification including elevation during the day and exercise as tolerated will be continued.    Patient should follow-up on an annual basis   2. Hyperlipidemia, unspecified hyperlipidemia type Continue statin as ordered and reviewed, no changes at this time   Current Outpatient Medications on File Prior to Visit  Medication Sig Dispense Refill   acetaminophen (TYLENOL) 500 MG tablet Take 500 mg by mouth every 6 (six) hours as needed for mild pain.     allopurinol (ZYLOPRIM) 300 MG tablet TAKE 1 TABLET BY MOUTH EVERY DAY 90 tablet 3   atorvastatin (LIPITOR) 40 MG tablet TAKE 1 TABLET BY MOUTH EVERY DAY 90 tablet 3   Cholecalciferol (VITAMIN D) 2000 UNITS CAPS Take 2,000 Units by mouth daily.     Clobetasol Propionate 0.05 % lotion APPLY TO AFFECTED AREA TWICE A DAY AS NEEDED     Coenzyme Q10 (CO Q-10) 200 MG CAPS Take 1 capsule by mouth daily. 30 each 1   desonide (DESOWEN) 0.05 % cream APPLY TO AFECTED AREA TWICE A DAY AS NEEDED  3   esomeprazole (NEXIUM) 20 MG capsule Take 1 capsule (20 mg total) by mouth daily before breakfast. 90 capsule 3   Fluocinolone Acetonide Scalp 0.01 % OIL APPLY TO SCALP AT BEDTIME,  WASH OUT WHEN WAKE UP  3   gabapentin (NEURONTIN) 600 MG tablet Take 1 tablet (600 mg total) by mouth 2 (two) times daily. 180 tablet 1   oxyCODONE (OXY IR/ROXICODONE) 5 MG immediate release tablet Take 5 mg by mouth every 6 (six) hours as needed.     XARELTO 20 MG TABS tablet TAKE 1 TABLET BY MOUTH DAILY WITH SUPPER 90 tablet 3   No current facility-administered medications on file prior to visit.    There are no Patient Instructions on file for this visit. No follow-ups on file.   Georgiana Spinner, NP

## 2022-08-23 DIAGNOSIS — G360 Neuromyelitis optica [Devic]: Secondary | ICD-10-CM | POA: Diagnosis not present

## 2022-09-16 ENCOUNTER — Telehealth: Payer: Self-pay | Admitting: Family Medicine

## 2022-09-16 NOTE — Telephone Encounter (Signed)
Copied from CRM [id number]. Topic: Medicare AWV >> Sep 16, 2022 10:18 AM Payton Doughty wrote: Reason for CRM: LM 09/16/2022 to schedule AWV   Verlee Rossetti; Care Guide Ambulatory Clinical Support [place] [facility] Direct Dial: [phone]

## 2022-10-01 DIAGNOSIS — M25512 Pain in left shoulder: Secondary | ICD-10-CM | POA: Diagnosis not present

## 2022-10-01 DIAGNOSIS — G8929 Other chronic pain: Secondary | ICD-10-CM | POA: Diagnosis not present

## 2022-10-01 DIAGNOSIS — G373 Acute transverse myelitis in demyelinating disease of central nervous system: Secondary | ICD-10-CM | POA: Diagnosis not present

## 2022-10-07 ENCOUNTER — Telehealth: Payer: Self-pay | Admitting: Family Medicine

## 2022-10-07 NOTE — Telephone Encounter (Signed)
He should come in to see [place] first for this. A podiatrist may not be the right specialist to see for this issue. It is ok for him to be booked in a 15 minute slot with me for this.

## 2022-10-07 NOTE — Telephone Encounter (Signed)
Copied from CRM [id number]. Topic: Medicare AWV >> Oct 07, 2022  9:14 AM Payton Doughty wrote: Reason for CRM: LVM 10/07/22 AWV time changed from 10am to 9:45am. Please confirm khc  Verlee Rossetti; Care Guide Ambulatory Clinical Support  l [facility] Direct Dial: [phone]

## 2022-10-07 NOTE — Telephone Encounter (Signed)
Patient would like a referral to a foot doctor. He states the reason is for retaining fluid in both feet.

## 2022-10-10 NOTE — Telephone Encounter (Signed)
Patient is scheduled for 10/13/22 for this issue.

## 2022-10-13 ENCOUNTER — Ambulatory Visit: Payer: Medicare HMO | Admitting: Family Medicine

## 2022-10-13 ENCOUNTER — Encounter: Payer: Self-pay | Admitting: Family Medicine

## 2022-10-13 VITALS — BP 122/78 | HR 96 | Temp 98.0°F | Ht 77.0 in | Wt 308.0 lb

## 2022-10-13 DIAGNOSIS — R600 Localized edema: Secondary | ICD-10-CM | POA: Diagnosis not present

## 2022-10-13 LAB — COMPREHENSIVE METABOLIC PANEL
ALT: 13 U/L (ref 0–53)
AST: 14 U/L (ref 0–37)
Albumin: 4.1 g/dL (ref 3.5–5.2)
Alkaline Phosphatase: 107 U/L (ref 39–117)
BUN: 12 mg/dL (ref 6–23)
CO2: 30 mEq/L (ref 19–32)
Calcium: 9.3 mg/dL (ref 8.4–10.5)
Chloride: 104 mEq/L (ref 96–112)
Creatinine, Ser: 0.9 mg/dL (ref 0.40–1.50)
GFR: 90.61 mL/min (ref >60.00)
Glucose, Bld: 97 mg/dL (ref 70–99)
Potassium: 4.1 mEq/L (ref 3.5–5.1)
Sodium: 139 mEq/L (ref 135–145)
Total Bilirubin: 1.5 mg/dL — ABNORMAL HIGH (ref 0.2–1.2)
Total Protein: 6.3 g/dL (ref 6.0–8.3)

## 2022-10-13 LAB — TSH: TSH: 1.48 u[IU]/mL (ref 0.35–5.50)

## 2022-10-13 LAB — CBC
HCT: 41 % (ref 39.0–52.0)
Hemoglobin: 13.4 g/dL (ref 13.0–17.0)
MCHC: 32.7 g/dL (ref 30.0–36.0)
MCV: 87.4 fl (ref 78.0–100.0)
Platelets: 186 10*3/uL (ref 150.0–400.0)
RBC: 4.69 Mil/uL (ref 4.22–5.81)
RDW: 13.6 % (ref 11.5–15.5)
WBC: 3.7 10*3/uL — ABNORMAL LOW (ref 4.0–10.5)

## 2022-10-13 NOTE — Patient Instructions (Signed)
Nice to see you. If your swelling gets worse please let us know.  If you develop spreading redness, fevers, or increasing pain please let us know. Vascular surgery should contact you to set up an evaluation to consider Unna boots. We will contact you with your lab results.

## 2022-10-13 NOTE — Progress Notes (Signed)
[name], MD Phone: [phone]  Matthew Juarez is a 62 y.o. male who presents today for same-day visit.  Bilateral leg swelling: Patient with onset of symptoms 7/2.  He notes this happened last year and he was diagnosed with cellulitis and ended up seeing podiatry and then later in the year vascular surgery.  Notes the swelling goes down overnight.  He notes no orthopnea, PND, shortness of breath, or fevers.  When he went to get her surgery for the swelling late last year he had Unna boots placed which were beneficial.  Social History   Tobacco Use  Smoking Status Former   Current packs/day: 0.00   Types: Cigarettes   Start date: 10/01/1982   Quit date: 04/01/1983   Years since quitting: 39.5  Smokeless Tobacco Never  Tobacco Comments   only smoke 2- cigs per day when he smoked    Current Outpatient Medications on File Prior to Visit  Medication Sig Dispense Refill   acetaminophen (TYLENOL) 500 MG tablet Take 500 mg by mouth every 6 (six) hours as needed for mild pain.     allopurinol (ZYLOPRIM) 300 MG tablet TAKE 1 TABLET BY MOUTH EVERY DAY 90 tablet 3   atorvastatin (LIPITOR) 40 MG tablet TAKE 1 TABLET BY MOUTH EVERY DAY 90 tablet 3   Cholecalciferol (VITAMIN D) 2000 UNITS CAPS Take 2,000 Units by mouth daily.     Clobetasol Propionate 0.05 % lotion APPLY TO AFFECTED AREA TWICE A DAY AS NEEDED     Coenzyme Q10 (CO Q-10) 200 MG CAPS Take 1 capsule by mouth daily. 30 each 1   desonide (DESOWEN) 0.05 % cream APPLY TO AFECTED AREA TWICE A DAY AS NEEDED  3   esomeprazole (NEXIUM) 20 MG capsule Take 1 capsule (20 mg total) by mouth daily before breakfast. 90 capsule 3   Fluocinolone Acetonide Scalp 0.01 % OIL APPLY TO SCALP AT BEDTIME, WASH OUT WHEN WAKE UP  3   gabapentin (NEURONTIN) 600 MG tablet Take 1 tablet (600 mg total) by mouth 2 (two) times daily. 180 tablet 1   oxyCODONE (OXY IR/ROXICODONE) 5 MG immediate release tablet Take 5 mg by mouth every 6 (six) hours as needed.      XARELTO 20 MG TABS tablet TAKE 1 TABLET BY MOUTH DAILY WITH SUPPER 90 tablet 3   No current facility-administered medications on file prior to visit.     ROS see history of present illness  Objective  Physical Exam Vitals:   10/13/22 1330  BP: 122/78  Pulse: 96  Temp: 98 F (36.7 C)  SpO2: 97%    BP Readings from Last 3 Encounters:  10/13/22 122/78  07/25/22 (!) 142/84  07/07/22 128/76   Wt Readings from Last 3 Encounters:  10/13/22 (!) 308 lb (139.7 kg)  07/07/22 (!) 308 lb (139.7 kg)  05/15/22 (!) 313 lb (142 kg)    Physical Exam Constitutional:      General: He is not in acute distress.    Appearance: He is not diaphoretic.  Cardiovascular:     Rate and Rhythm: Normal rate and regular rhythm.     Heart sounds: Normal heart sounds.  Pulmonary:     Effort: Pulmonary effort is normal.     Breath sounds: Normal breath sounds.  Musculoskeletal:     Comments: Bilateral lower extremity pitting edema to the midshin  Skin:    General: Skin is warm and dry.  Neurological:     Mental Status: He is alert.  Drainage is serous, maceration between toes, patient reports toes are almost always like this  Assessment/Plan: Please see individual problem list.  Bilateral lower extremity edema Assessment & Plan: Symptoms are likely related to venous insufficiency.  No apparent signs of infection at this time.  We are checking lab work today to evaluate for other underlying causes.  I will refer back to vascular surgery to consider Unna boots and further treatment.  Advised to seek medical attention for any signs of infection.  Orders: -     CBC -     TSH -     Comprehensive metabolic panel -     Ambulatory referral to Vascular Surgery     Return if symptoms worsen or fail to improve.   [name], MD [facility]

## 2022-10-13 NOTE — Assessment & Plan Note (Signed)
Symptoms are likely related to venous insufficiency.  No apparent signs of infection at this time.  We are checking lab work today to evaluate for other underlying causes.  I will refer back to vascular surgery to consider Unna boots and further treatment.  Advised to seek medical attention for any signs of infection.

## 2022-10-14 ENCOUNTER — Ambulatory Visit: Payer: Medicare HMO | Admitting: *Deleted

## 2022-10-14 ENCOUNTER — Telehealth: Payer: Self-pay | Admitting: Family Medicine

## 2022-10-14 VITALS — Ht 77.0 in | Wt 312.0 lb

## 2022-10-14 DIAGNOSIS — Z0000 Encounter for general adult medical examination without abnormal findings: Secondary | ICD-10-CM

## 2022-10-14 NOTE — Progress Notes (Signed)
Subjective:   Matthew Juarez is a 62 y.o. male who presents for Medicare Annual/Subsequent preventive examination.  Visit Complete: Virtual  I connected with  [name] on 10/14/22 by a audio enabled telemedicine application and verified that I am speaking with the correct person using two identifiers.  Patient Location: Home  Provider Location: Office/Clinic  I discussed the limitations of evaluation and management by telemedicine. The patient expressed understanding and agreed to proceed.  Review of Systems     Cardiac Risk Factors include: advanced age (>26men, >89 women);dyslipidemia;sedentary lifestyle;obesity (BMI >30kg/m2)     Objective:    Today's Vitals   10/14/22 0944  Weight: (!) 312 lb (141.5 kg)  Height: 6\' 5"  (1.956 m)   Body mass index is 37 kg/m.     10/14/2022    9:58 AM 09/27/2021    3:34 PM 09/21/2020    9:20 AM 09/21/2019    9:08 AM 07/17/2019   12:22 AM 04/28/2019    5:46 PM 09/20/2018    9:28 AM  Advanced Directives  Does Patient Have a Medical Advance Directive? No No No No No No No  Would patient like information on creating a medical advance directive?  No - Patient declined No - Patient declined No - Patient declined  No - Patient declined No - Patient declined    Current Medications (verified) Outpatient Encounter Medications as of 10/14/2022  Medication Sig   acetaminophen (TYLENOL) 500 MG tablet Take 500 mg by mouth every 6 (six) hours as needed for mild pain.   allopurinol (ZYLOPRIM) 300 MG tablet TAKE 1 TABLET BY MOUTH EVERY DAY   atorvastatin (LIPITOR) 40 MG tablet TAKE 1 TABLET BY MOUTH EVERY DAY   Cholecalciferol (VITAMIN D) 2000 UNITS CAPS Take 2,000 Units by mouth daily.   Clobetasol Propionate 0.05 % lotion APPLY TO AFFECTED AREA TWICE A DAY AS NEEDED   Coenzyme Q10 (CO Q-10) 200 MG CAPS Take 1 capsule by mouth daily.   desonide (DESOWEN) 0.05 % cream APPLY TO AFECTED AREA TWICE A DAY AS NEEDED   esomeprazole (NEXIUM) 20 MG  capsule Take 1 capsule (20 mg total) by mouth daily before breakfast.   Fluocinolone Acetonide Scalp 0.01 % OIL APPLY TO SCALP AT BEDTIME, WASH OUT WHEN WAKE UP   gabapentin (NEURONTIN) 600 MG tablet Take 1 tablet (600 mg total) by mouth 2 (two) times daily.   oxyCODONE (OXY IR/ROXICODONE) 5 MG immediate release tablet Take 5 mg by mouth every 6 (six) hours as needed.   XARELTO 20 MG TABS tablet TAKE 1 TABLET BY MOUTH DAILY WITH SUPPER   No facility-administered encounter medications on file as of 10/14/2022.    Allergies (verified) Patient has no known allergies.   History: Past Medical History:  Diagnosis Date   Arthritis    Cardiac arrest (HCC) 06/23/2012   Chronic back pain    COVID-19 04/19/2019   Gout    Hammer toe    Headache(784.0)    History of blood clots    Hypertension    Pulmonary embolism (HCC)    Pulmonary infarction (HCC) 07/23/2012   Overview:  Last Assessment & Plan:  With associated R heart failure and cardiac arrest.  - you could make an argument to treat him with lifelong coumadin given the severity of his PE, although the literature supports 12 months.  - he will be treated (at least) 12 months, to be followed by Dr Renae Gloss.  - will need hypercoag w/u a month after  the coumadin is stopped - restart coumadin if at any incr   Transverse myelitis [facility])    Past Surgical History:  Procedure Laterality Date   HAMMER TOE SURGERY     2016   Family History  Problem Relation Age of Onset   Dementia Mother    Lung cancer Maternal Uncle    Colon cancer Maternal Aunt    Lung cancer Maternal Aunt    Heart disease Neg Hx    Social History   Socioeconomic History   Marital status: Married    Spouse name: Not on file   Number of children: Not on file   Years of education: Not on file   Highest education level: 12th grade  Occupational History   Occupation: disability  Tobacco Use   Smoking status: Former    Current packs/day: 0.00    Types: Cigarettes    Start  date: 10/01/1982    Quit date: 04/01/1983    Years since quitting: 39.5   Smokeless tobacco: Never   Tobacco comments:    only smoke 2- cigs per day when he smoked  Vaping Use   Vaping status: Never Used  Substance and Sexual Activity   Alcohol use: No    Alcohol/week: 0.0 standard drinks of alcohol   Drug use: No   Sexual activity: Not Currently  Other Topics Concern   Not on file  Social History Narrative   Married    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    From [place] went to [employer]   Social Determinants of Health   Financial Resource Strain: Low Risk  (10/14/2022)   Overall Financial Resource Strain (CARDIA)    Difficulty of Paying Living Expenses: Not hard at all  Food Insecurity: No Food Insecurity (10/14/2022)   Hunger Vital Sign    Worried About Running Out of Food in the Last Year: Never true    Ran Out of Food in the Last Year: Never true  Transportation Needs: No Transportation Needs (10/14/2022)   PRAPARE - Administrator, Civil Service (Medical): No    Lack of Transportation (Non-Medical): No  Physical Activity: Inactive (10/14/2022)   Exercise Vital Sign    Days of Exercise per Week: 0 days    Minutes of Exercise per Session: 0 min  Stress: No Stress Concern Present (10/14/2022)   [employer] of Occupational Health - Occupational Stress Questionnaire    Feeling of Stress : Not at all  Social Connections: Moderately Integrated (10/14/2022)   Social Connection and Isolation Panel [NHANES]    Frequency of Communication with Friends and Family: More than three times a week    Frequency of Social Gatherings with Friends and Family: More than three times a week    Attends Religious Services: More than 4 times per year    Active Member of [employer] or Organizations: No    Attends Engineer, structural: Never    Marital Status: Married    Tobacco Counseling Counseling given: Not Answered Tobacco comments: only smoke 2- cigs per day when he  smoked   Clinical Intake:  Pre-visit preparation completed: Yes  Pain : No/denies pain     BMI - recorded: 37 Nutritional Status: BMI > 30  Obese Nutritional Risks: None Diabetes: No  How often do you need to have someone help you when you read instructions, pamphlets, or other written materials from your doctor or pharmacy?: 1 - Never  Interpreter Needed?: No  Information entered by ::  R. Rhonin Trott LPN   Activities of Daily Living    10/14/2022    9:47 AM 10/10/2022    1:00 PM  In your present state of health, do you have any difficulty performing the following activities:  Hearing? 0 0  Vision? 0 0  Comment readers   Difficulty concentrating or making decisions? 0 0  Walking or climbing stairs? 1   Comment wheelchair and walker   Dressing or bathing? 0 0  Doing errands, shopping? 1 0  Comment wife and family helps   Preparing Food and eating ? N N  Using the Toilet? N N  In the past six months, have you accidently leaked urine? N N  Do you have problems with loss of bowel control? N N  Managing your Medications? N N  Managing your Finances? N N  Housekeeping or managing your Housekeeping? N N    Patient Care Team: Glori Luis, MD as PCP - General (Family Medicine)  Indicate any recent Medical Services you may have received from other than [facility] providers in the past year (date may be approximate).     Assessment:   This is a routine wellness examination for [place].  Hearing/Vision screen Hearing Screening - Comments:: No issues Vision Screening - Comments:: readers  Dietary issues and exercise activities discussed:     Goals Addressed             This Visit's Progress    Patient Stated       Wants to exercise, walk some and lose some weight       Depression Screen    10/14/2022    9:52 AM 10/13/2022    1:39 PM 07/07/2022    9:22 AM 04/03/2022   11:17 AM 12/12/2021    9:44 AM 09/30/2021    8:54 AM 09/27/2021    3:35 PM  PHQ 2/9 Scores  PHQ  - 2 Score 0 0 0 0 0 0 0  PHQ- 9 Score 2 0 0 0       Fall Risk    10/14/2022    9:55 AM 10/13/2022    1:39 PM 10/10/2022    1:00 PM 07/07/2022    9:21 AM 04/03/2022   11:16 AM  Fall Risk   Falls in the past year? 0 0 0 0 0  Number falls in past yr: 0 0  0 0  Injury with Fall? 0 0  0 0  Risk for fall due to : No Fall Risks No Fall Risks  No Fall Risks No Fall Risks  Follow up Falls prevention discussed;Falls evaluation completed Falls evaluation completed  Falls evaluation completed Falls evaluation completed    MEDICARE RISK AT HOME:  Medicare Risk at Home - 10/14/22 0955     Any stairs in or around the home? Yes    If so, are there any without handrails? No    Home free of loose throw rugs in walkways, pet beds, electrical cords, etc? Yes    Adequate lighting in your home to reduce risk of falls? Yes    Life alert? No    Use of a cane, walker or w/c? Yes   walker and wheelchair   Grab bars in the bathroom? Yes    Shower chair or bench in shower? Yes    Elevated toilet seat or a handicapped toilet? Yes              Cognitive Function:    09/09/2017   10:54 AM  08/21/2016   10:26 AM  MMSE - Mini Mental State Exam  Orientation to time 5 5  Orientation to Place 5 5  Registration 3 3  Attention/ Calculation 5 5  Recall 3 3  Language- name 2 objects 2 2  Language- repeat 1 1  Language- follow 3 step command 3 3  Language- read & follow direction 1 1  Write a sentence 1 1  Copy design 1 1  Total score 30 30        10/14/2022    9:59 AM 09/21/2020    9:15 AM 09/21/2019    9:14 AM 09/20/2018    9:30 AM  6CIT Screen  What Year? 0 points 0 points 0 points 0 points  What month? 0 points 0 points 0 points 0 points  What time? 0 points 0 points  0 points  Count back from 20 0 points   0 points  Months in reverse 0 points 0 points 0 points 0 points  Repeat phrase 0 points  0 points   Total Score 0 points       Immunizations Immunization History  Administered Date(s)  Administered   Hepatitis A, Adult 11/12/2017, 11/09/2018   Influenza Split 01/30/2012   Influenza,inj,Quad PF,6+ Mos 12/29/2013, 01/16/2016, 04/27/2017, 02/09/2018, 12/22/2018, 05/23/2020, 01/03/2022   Influenza-Unspecified 02/19/2012, 01/01/2014, 01/16/2016, 04/27/2017, 02/09/2018   PFIZER(Purple Top)SARS-COV-2 Vaccination 06/25/2019, 07/16/2019   PPD Test 12/05/2011   Zoster Recombinant(Shingrix) 03/13/2021, 05/15/2021    TDAP status: Due, Education has been provided regarding the importance of this vaccine. Advised may receive this vaccine at local pharmacy or Health Dept. Aware to provide a copy of the vaccination record if obtained from local pharmacy or Health Dept. Verbalized acceptance and understanding.  Flu Vaccine status: Up to date  Pneumococcal vaccine status: Due, Education has been provided regarding the importance of this vaccine. Advised may receive this vaccine at local pharmacy or Health Dept. Aware to provide a copy of the vaccination record if obtained from local pharmacy or Health Dept. Verbalized acceptance and understanding.  Covid-19 vaccine status: Completed vaccines  Qualifies for Shingles Vaccine? Yes   Zostavax completed No   Shingrix Completed?: Yes  Screening Tests Health Maintenance  Topic Date Due   DTaP/Tdap/Td (1 - Tdap) Never done   Fecal DNA (Cologuard)  Never done   Medicare Annual Wellness (AWV)  09/28/2022   COVID-19 Vaccine (3 - Pfizer risk series) 10/29/2022 (Originally 08/13/2019)   INFLUENZA VACCINE  10/30/2022   Hepatitis C Screening  Completed   HIV Screening  Completed   Zoster Vaccines- Shingrix  Completed   HPV VACCINES  Aged Out   Colonoscopy  Discontinued    Health Maintenance  Health Maintenance Due  Topic Date Due   DTaP/Tdap/Td (1 - Tdap) Never done   Fecal DNA (Cologuard)  Never done   Medicare Annual Wellness (AWV)  09/28/2022    Colorectal cancer screening: Type of screening: Cologuard. Completed 11/23. Repeat every  3 years Patient stated that he did the test but has never gotten the results.  Lung Cancer Screening: (Low Dose CT Chest recommended if Age 78-80 years, 20 pack-year currently smoking OR have quit w/in 15years.) does not qualify.     Additional Screening:  Hepatitis C Screening: does qualify; Completed 7/17  Vision Screening: Recommended annual ophthalmology exams for early detection of glaucoma and other disorders of the eye. Is the patient up to date with their annual eye exam?  Yes  Who is the provider or what is  the name of the office in which the patient attends annual eye exams? Dr. Gardenia Phlegm If pt is not established with a provider, would they like to be referred to a provider to establish care? No .   Dental Screening: Recommended annual dental exams for proper oral hygiene    Community Resource Referral / Chronic Care Management: CRR required this visit?  No   CCM required this visit?  No     Plan:     I have personally reviewed and noted the following in the patient's chart:   Medical and social history Use of alcohol, tobacco or illicit drugs  Current medications and supplements including opioid prescriptions. Patient is currently taking opioid prescriptions. Information provided to patient regarding non-opioid alternatives. Patient advised to discuss non-opioid treatment plan with their provider. Functional ability and status Nutritional status Physical activity Advanced directives List of other physicians Hospitalizations, surgeries, and ER visits in previous 12 months Vitals Screenings to include cognitive, depression, and falls Referrals and appointments  In addition, I have reviewed and discussed with patient certain preventive protocols, quality metrics, and best practice recommendations. A written personalized care plan for preventive services as well as general preventive health recommendations were provided to patient.     Sydell Axon,  LPN   [id number]   After Visit Summary: (MyChart) Due to this being a telephonic visit, the after visit summary with patients personalized plan was offered to patient via MyChart   Nurse Notes: None

## 2022-10-14 NOTE — Telephone Encounter (Signed)
 Noted  

## 2022-10-14 NOTE — Telephone Encounter (Signed)
Abstracted Cologuard negative from 01/14/22 will put in your folder and add initials, not sure why this did not come over in Epic was it ordered through EPIC . I will have cologuard Rep; take a look to make sure but looks like may have been because the order was placed through the cologuard website instead of EPIC by previous CMA.

## 2022-10-14 NOTE — Patient Instructions (Signed)
ASSESSMENT:Per patient no change in vitals since last visit, unable to obtain new vitals due to telehealth visit  Matthew Juarez , Thank you for taking time to come for your Medicare Wellness Visit. I appreciate your ongoing commitment to your health goals. Please review the following plan we discussed and let me know if I can assist you in the future.   These are the goals we discussed:  Goals       Patient Stated      Wants to exercise, walk some and lose some weight      Weight (lb) < 250 lb (113.4 kg) (pt-stated)      Stay active with daily chair exercises        This is a list of the screening recommended for you and due dates:  Health Maintenance  Topic Date Due   DTaP/Tdap/Td vaccine (1 - Tdap) Never done   Cologuard (Stool DNA test)  Never done   COVID-19 Vaccine (3 - Pfizer risk series) 10/29/2022*   Flu Shot  10/30/2022   Medicare Annual Wellness Visit  10/14/2023   Hepatitis C Screening  Completed   HIV Screening  Completed   Zoster (Shingles) Vaccine  Completed   HPV Vaccine  Aged Out   Colon Cancer Screening  Discontinued  *Topic was postponed. The date shown is not the original due date.    Advanced directives: Will work on this  Conditions/risks identified: None  Next appointment: Follow up in one year for your annual wellness visit 10/16/23 @ 8:15  Preventive Care 40-64 Years, Male Preventive care refers to lifestyle choices and visits with your health care provider that can promote health and wellness. What does preventive care include? A yearly physical exam. This is also called an annual well check. Dental exams once or twice a year. Routine eye exams. Ask your health care provider how often you should have your eyes checked. Personal lifestyle choices, including: Daily care of your teeth and gums. Regular physical activity. Eating a healthy diet. Avoiding tobacco and drug use. Limiting alcohol use. Practicing safe sex. Taking low-dose aspirin every day  starting at age 64. What happens during an annual well check? The services and screenings done by your health care provider during your annual well check will depend on your age, overall health, lifestyle risk factors, and family history of disease. Counseling  Your health care provider may ask you questions about your: Alcohol use. Tobacco use. Drug use. Emotional well-being. Home and relationship well-being. Sexual activity. Eating habits. Work and work Astronomer. Screening  You may have the following tests or measurements: Height, weight, and BMI. Blood pressure. Lipid and cholesterol levels. These may be checked every 5 years, or more frequently if you are over 64 years old. Skin check. Lung cancer screening. You may have this screening every year starting at age 37 if you have a 30-pack-year history of smoking and currently smoke or have quit within the past 15 years. Fecal occult blood test (FOBT) of the stool. You may have this test every year starting at age 53. Flexible sigmoidoscopy or colonoscopy. You may have a sigmoidoscopy every 5 years or a colonoscopy every 10 years starting at age 14. Prostate cancer screening. Recommendations will vary depending on your family history and other risks. Hepatitis C blood test. Hepatitis B blood test. Sexually transmitted disease (STD) testing. Diabetes screening. This is done by checking your blood sugar (glucose) after you have not eaten for a while (fasting). You may have this  done every 1-3 years. Discuss your test results, treatment options, and if necessary, the need for more tests with your health care provider. Vaccines  Your health care provider may recommend certain vaccines, such as: Influenza vaccine. This is recommended every year. Tetanus, diphtheria, and acellular pertussis (Tdap, Td) vaccine. You may need a Td booster every 10 years. Zoster vaccine. You may need this after age 67. Pneumococcal 13-valent conjugate  (PCV13) vaccine. You may need this if you have certain conditions and have not been vaccinated. Pneumococcal polysaccharide (PPSV23) vaccine. You may need one or two doses if you smoke cigarettes or if you have certain conditions. Talk to your health care provider about which screenings and vaccines you need and how often you need them. This information is not intended to replace advice given to you by your health care provider. Make sure you discuss any questions you have with your health care provider. Document Released: 04/13/2015 Document Revised: 12/05/2015 Document Reviewed: 01/16/2015 Elsevier Interactive Patient Education  2017 [employer].  Fall Prevention in the Home Falls can cause injuries. They can happen to people of all ages. There are many things you can do to make your home safe and to help prevent falls. What can I do on the outside of my home? Regularly fix the edges of walkways and driveways and fix any cracks. Remove anything that might make you trip as you walk through a door, such as a raised step or threshold. Trim any bushes or trees on the path to your home. Use bright outdoor lighting. Clear any walking paths of anything that might make someone trip, such as rocks or tools. Regularly check to see if handrails are loose or broken. Make sure that both sides of any steps have handrails. Any raised decks and porches should have guardrails on the edges. Have any leaves, snow, or ice cleared regularly. Use sand or salt on walking paths during winter. Clean up any spills in your garage right away. This includes oil or grease spills. What can I do in the bathroom? Use night lights. Install grab bars by the toilet and in the tub and shower. Do not use towel bars as grab bars. Use non-skid mats or decals in the tub or shower. If you need to sit down in the shower, use a plastic, non-slip stool. Keep the floor dry. Clean up any water that spills on the floor as soon as it  happens. Remove soap buildup in the tub or shower regularly. Attach bath mats securely with double-sided non-slip rug tape. Do not have throw rugs and other things on the floor that can make you trip. What can I do in the bedroom? Use night lights. Make sure that you have a light by your bed that is easy to reach. Do not use any sheets or blankets that are too big for your bed. They should not hang down onto the floor. Have a firm chair that has side arms. You can use this for support while you get dressed. Do not have throw rugs and other things on the floor that can make you trip. What can I do in the kitchen? Clean up any spills right away. Avoid walking on wet floors. Keep items that you use a lot in easy-to-reach places. If you need to reach something above you, use a strong step stool that has a grab bar. Keep electrical cords out of the way. Do not use floor polish or wax that makes floors slippery. If you must  use wax, use non-skid floor wax. Do not have throw rugs and other things on the floor that can make you trip. What can I do with my stairs? Do not leave any items on the stairs. Make sure that there are handrails on both sides of the stairs and use them. Fix handrails that are broken or loose. Make sure that handrails are as long as the stairways. Check any carpeting to make sure that it is firmly attached to the stairs. Fix any carpet that is loose or worn. Avoid having throw rugs at the top or bottom of the stairs. If you do have throw rugs, attach them to the floor with carpet tape. Make sure that you have a light switch at the top of the stairs and the bottom of the stairs. If you do not have them, ask someone to add them for you. What else can I do to help prevent falls? Wear shoes that: Do not have high heels. Have rubber bottoms. Are comfortable and fit you well. Are closed at the toe. Do not wear sandals. If you use a stepladder: Make sure that it is fully opened.  Do not climb a closed stepladder. Make sure that both sides of the stepladder are locked into place. Ask someone to hold it for you, if possible. Clearly mark and make sure that you can see: Any grab bars or handrails. First and last steps. Where the edge of each step is. Use tools that help you move around (mobility aids) if they are needed. These include: Canes. Walkers. Scooters. Crutches. Turn on the lights when you go into a dark area. Replace any light bulbs as soon as they burn out. Set up your furniture so you have a clear path. Avoid moving your furniture around. If any of your floors are uneven, fix them. If there are any pets around you, be aware of where they are. Review your medicines with your doctor. Some medicines can make you feel dizzy. This can increase your chance of falling. Ask your doctor what other things that you can do to help prevent falls. This information is not intended to replace advice given to you by your health care provider. Make sure you discuss any questions you have with your health care provider. Document Released: 01/11/2009 Document Revised: 08/23/2015 Document Reviewed: 04/21/2014 Elsevier Interactive Patient Education  2017 [employer].

## 2022-10-27 DIAGNOSIS — H524 Presbyopia: Secondary | ICD-10-CM | POA: Diagnosis not present

## 2022-10-27 DIAGNOSIS — H04123 Dry eye syndrome of bilateral lacrimal glands: Secondary | ICD-10-CM | POA: Diagnosis not present

## 2022-10-27 DIAGNOSIS — H25813 Combined forms of age-related cataract, bilateral: Secondary | ICD-10-CM | POA: Diagnosis not present

## 2022-10-30 ENCOUNTER — Ambulatory Visit (INDEPENDENT_AMBULATORY_CARE_PROVIDER_SITE_OTHER): Payer: Medicare HMO | Admitting: Nurse Practitioner

## 2022-10-30 ENCOUNTER — Encounter (INDEPENDENT_AMBULATORY_CARE_PROVIDER_SITE_OTHER): Payer: Self-pay | Admitting: Nurse Practitioner

## 2022-10-30 ENCOUNTER — Other Ambulatory Visit (INDEPENDENT_AMBULATORY_CARE_PROVIDER_SITE_OTHER): Payer: Self-pay | Admitting: Nurse Practitioner

## 2022-10-30 VITALS — BP 156/90 | HR 64 | Resp 16

## 2022-10-30 DIAGNOSIS — S91309A Unspecified open wound, unspecified foot, initial encounter: Secondary | ICD-10-CM

## 2022-10-30 DIAGNOSIS — E785 Hyperlipidemia, unspecified: Secondary | ICD-10-CM | POA: Diagnosis not present

## 2022-10-30 DIAGNOSIS — I890 Lymphedema, not elsewhere classified: Secondary | ICD-10-CM

## 2022-10-30 NOTE — Progress Notes (Signed)
Subjective:    Patient ID: Matthew Juarez, male    DOB: 11/21/1960, 62 y.o.   MRN: [id number] Chief Complaint  Patient presents with   Follow-up    Le sweling    Matthew Juarez is a 62 year old male who returns today for follow-up evaluation of lower extremity edema.  The patient typically utilizes medical grade compression socks but recently his leg swelling has progressed to the point that he is unable to utilize them.  We have previously had the patient in Unna boots for which has been very helpful for his progression with swelling.  The additional complication he is having now is that he is having weeping from his toes.  It does appear that there is shallow wounds in the interspace between his toes, which happened to be swollen as well.    Review of Systems  Cardiovascular:  Positive for leg swelling.  Skin:  Positive for wound.  Neurological:  Positive for weakness.  All other systems reviewed and are negative.      Objective:   Physical Exam Vitals reviewed.  Constitutional:      Appearance: He is normal weight.  HENT:     Head: Normocephalic.  Cardiovascular:     Rate and Rhythm: Normal rate.  Pulmonary:     Effort: Pulmonary effort is normal.  Musculoskeletal:     Right lower leg: 2+ Pitting Edema present.     Left lower leg: 2+ Pitting Edema present.  Skin:    General: Skin is warm and dry.  Neurological:     Mental Status: He is alert. Mental status is at baseline. He is disoriented.     Motor: Weakness present.     Gait: Gait abnormal.     BP (!) 156/90 (BP Location: Left Arm)   Pulse 64   Resp 16   Past Medical History:  Diagnosis Date   Arthritis    Cardiac arrest (HCC) 06/23/2012   Chronic back pain    COVID-19 04/19/2019   Gout    Hammer toe    Headache(784.0)    History of blood clots    Hypertension    Pulmonary embolism (HCC)    Pulmonary infarction (HCC) 07/23/2012   Overview:  Last Assessment & Plan:  With associated R heart failure and  cardiac arrest.  - you could make an argument to treat him with lifelong coumadin given the severity of his PE, although the literature supports 12 months.  - he will be treated (at least) 12 months, to be followed by Dr Renae Gloss.  - will need hypercoag w/u a month after the coumadin is stopped - restart coumadin if at any incr   Transverse myelitis (HCC)     Social History   Socioeconomic History   Marital status: Married    Spouse name: Not on file   Number of children: Not on file   Years of education: Not on file   Highest education level: 12th grade  Occupational History   Occupation: disability  Tobacco Use   Smoking status: Former    Current packs/day: 0.00    Types: Cigarettes    Start date: 10/01/1982    Quit date: 04/01/1983    Years since quitting: 39.6   Smokeless tobacco: Never   Tobacco comments:    only smoke 2- cigs per day when he smoked  Vaping Use   Vaping status: Never Used  Substance and Sexual Activity   Alcohol use: No    Alcohol/week: 0.0 standard  drinks of alcohol   Drug use: No   Sexual activity: Not Currently  Other Topics Concern   Not on file  Social History Narrative   Married    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    From [place] went to [employer]   Social Determinants of Health   Financial Resource Strain: Low Risk  (10/14/2022)   Overall Financial Resource Strain (CARDIA)    Difficulty of Paying Living Expenses: Not hard at all  Food Insecurity: No Food Insecurity (10/14/2022)   Hunger Vital Sign    Worried About Running Out of Food in the Last Year: Never true    Ran Out of Food in the Last Year: Never true  Transportation Needs: No Transportation Needs (10/14/2022)   PRAPARE - Administrator, Civil Service (Medical): No    Lack of Transportation (Non-Medical): No  Physical Activity: Inactive (10/14/2022)   Exercise Vital Sign    Days of Exercise per Week: 0 days    Minutes of Exercise per Session: 0 min  Stress: No Stress  Concern Present (10/14/2022)   [employer] of Occupational Health - Occupational Stress Questionnaire    Feeling of Stress : Not at all  Social Connections: Moderately Integrated (10/14/2022)   Social Connection and Isolation Panel [NHANES]    Frequency of Communication with Friends and Family: More than three times a week    Frequency of Social Gatherings with Friends and Family: More than three times a week    Attends Religious Services: More than 4 times per year    Active Member of Clubs or Organizations: No    Attends Banker Meetings: Never    Marital Status: Married  Catering manager Violence: Not At Risk (10/14/2022)   Humiliation, Afraid, Rape, and Kick questionnaire    Fear of Current or Ex-Partner: No    Emotionally Abused: No    Physically Abused: No    Sexually Abused: No    Past Surgical History:  Procedure Laterality Date   HAMMER TOE SURGERY     2016    Family History  Problem Relation Age of Onset   Dementia Mother    Lung cancer Maternal Uncle    Colon cancer Maternal Aunt    Lung cancer Maternal Aunt    Heart disease Neg Hx     No Known Allergies     Latest Ref Rng & Units 10/13/2022    1:55 PM 12/12/2021   10:17 AM 09/27/2021   12:07 PM  CBC  WBC 4.0 - 10.5 K/uL 3.7  4.0  8.4   Hemoglobin 13.0 - 17.0 g/dL 16.1  09.6  04.5   Hematocrit 39.0 - 52.0 % 41.0  41.7  42.8   Platelets 150.0 - 400.0 K/uL 186.0  190.0  190.0       CMP     Component Value Date/Time   NA 139 10/13/2022 1355   NA 135 (L) 04/12/2012 1723   K 4.1 10/13/2022 1355   K 4.0 04/12/2012 1723   CL 104 10/13/2022 1355   CL 104 04/12/2012 1723   CO2 30 10/13/2022 1355   CO2 26 04/12/2012 1723   GLUCOSE 97 10/13/2022 1355   GLUCOSE 90 04/12/2012 1723   BUN 12 10/13/2022 1355   BUN 14 04/12/2012 1723   CREATININE 0.90 10/13/2022 1355   CREATININE 1.05 01/03/2022 1437   CALCIUM 9.3 10/13/2022 1355   CALCIUM 9.2 04/12/2012 1723   PROT 6.3 10/13/2022 1355  PROT 7.7 04/12/2012 1723   ALBUMIN 4.1 10/13/2022 1355   ALBUMIN 4.2 04/12/2012 1723   AST 14 10/13/2022 1355   AST 29 04/12/2012 1723   ALT 13 10/13/2022 1355   ALT 32 04/12/2012 1723   ALKPHOS 107 10/13/2022 1355   ALKPHOS 100 04/12/2012 1723   BILITOT 1.5 (H) 10/13/2022 1355   BILITOT 1.0 04/12/2012 1723   GFR 90.61 10/13/2022 1355   GFRNONAA >60 05/17/2019 1707   GFRNONAA >60 04/12/2012 1723     No results found.     Assessment & Plan:   1. Lymphedema We will place the patient in Unna boots today in order to help with his lower extremity edema.  I suspect some of the weeping of the toes is being driven by his edema.  He has tolerated this well in the past.  In addition to try to help with control of his lower extremity edema I feel that a lymph pump is warranted.  Recommend:  No surgery or intervention at this point in time.   The Patient is CEAP [id number].  The patient has been wearing compression for more than 12 weeks with no or little benefit.  The patient has been exercising daily for more than 12 weeks. The patient has been elevating and taking OTC pain medications for more than 12 weeks.  None of these have have eliminated the pain related to the lymphedema or the discomfort regarding excessive swelling and venous congestion.    I have reviewed my discussion with the patient regarding lymphedema and why it  causes symptoms.  Patient will continue wearing graduated compression on a daily basis. The patient should put the compression on first thing in the morning and removing them in the evening. The patient should not sleep in the compression.   In addition, behavioral modification throughout the day will be continued.  This will include frequent elevation (such as in a recliner), use of over the counter pain medications as needed and exercise such as walking.  The systemic causes for chronic edema such as liver, kidney and cardiac etiologies do not appear to have  significant changed over the past year.    The patient has chronic , severe lymphedema with hyperpigmentation of the skin and has done MLD, skin care, medication, diet, exercise, elevation and compression for 4 weeks with no improvement,  I am recommending a lymphedema pump.  The patient still has stage 3 lymphedema and therefore, I believe that a lymph pump is needed to improve the control of the patient's lymphedema and improve the quality of life.  Additionally, a lymph pump is warranted because it will reduce the risk of cellulitis and ulceration in the future.  Patient should follow-up in six months   2. Hyperlipidemia, unspecified hyperlipidemia type Continue statin as ordered and reviewed, no changes at this time  3. Open wound of foot, unspecified laterality, initial encounter Today I have cultured the area.  We have cleaned the area and placed triple antibiotic ointment with a gauze in the area.  Will also send patient to podiatrist due to patient surprised   Current Outpatient Medications on File Prior to Visit  Medication Sig Dispense Refill   acetaminophen (TYLENOL) 500 MG tablet Take 500 mg by mouth every 6 (six) hours as needed for mild pain.     allopurinol (ZYLOPRIM) 300 MG tablet TAKE 1 TABLET BY MOUTH EVERY DAY 90 tablet 3   atorvastatin (LIPITOR) 40 MG tablet TAKE 1 TABLET BY MOUTH EVERY DAY  90 tablet 3   Cholecalciferol (VITAMIN D) 2000 UNITS CAPS Take 2,000 Units by mouth daily.     Clobetasol Propionate 0.05 % lotion APPLY TO AFFECTED AREA TWICE A DAY AS NEEDED     Coenzyme Q10 (CO Q-10) 200 MG CAPS Take 1 capsule by mouth daily. 30 each 1   desonide (DESOWEN) 0.05 % cream APPLY TO AFECTED AREA TWICE A DAY AS NEEDED  3   esomeprazole (NEXIUM) 20 MG capsule Take 1 capsule (20 mg total) by mouth daily before breakfast. 90 capsule 3   Fluocinolone Acetonide Scalp 0.01 % OIL APPLY TO SCALP AT BEDTIME, WASH OUT WHEN WAKE UP  3   gabapentin (NEURONTIN) 600 MG tablet Take 1  tablet (600 mg total) by mouth 2 (two) times daily. 180 tablet 1   oxyCODONE (OXY IR/ROXICODONE) 5 MG immediate release tablet Take 5 mg by mouth every 6 (six) hours as needed.     XARELTO 20 MG TABS tablet TAKE 1 TABLET BY MOUTH DAILY WITH SUPPER 90 tablet 3   No current facility-administered medications on file prior to visit.    There are no Patient Instructions on file for this visit. No follow-ups on file.   Georgiana Spinner, NP

## 2022-11-04 ENCOUNTER — Other Ambulatory Visit (INDEPENDENT_AMBULATORY_CARE_PROVIDER_SITE_OTHER): Payer: Self-pay | Admitting: Nurse Practitioner

## 2022-11-04 MED ORDER — CIPROFLOXACIN HCL 250 MG PO TABS: 750.0000 mg | ORAL_TABLET | Freq: Two times a day (BID) | ORAL | 0 refills | Status: DC

## 2022-11-06 ENCOUNTER — Encounter (INDEPENDENT_AMBULATORY_CARE_PROVIDER_SITE_OTHER): Payer: Self-pay

## 2022-11-06 ENCOUNTER — Ambulatory Visit (INDEPENDENT_AMBULATORY_CARE_PROVIDER_SITE_OTHER): Payer: Medicare HMO | Admitting: Nurse Practitioner

## 2022-11-06 VITALS — BP 139/89 | HR 64 | Resp 18 | Ht 77.0 in | Wt 312.0 lb

## 2022-11-06 DIAGNOSIS — I890 Lymphedema, not elsewhere classified: Secondary | ICD-10-CM | POA: Diagnosis not present

## 2022-11-06 NOTE — Progress Notes (Signed)
History of Present Illness  There is no documented history at this time  Assessments & Plan   There are no diagnoses linked to this encounter.    Additional instructions  Subjective:  Patient presents with venous ulcer of the Bilateral lower extremity.    Procedure:  3 layer unna wrap was placed Bilateral lower extremity.   Plan:   Follow up in one week.  

## 2022-11-10 DIAGNOSIS — G360 Neuromyelitis optica [Devic]: Secondary | ICD-10-CM | POA: Diagnosis not present

## 2022-11-13 ENCOUNTER — Ambulatory Visit (INDEPENDENT_AMBULATORY_CARE_PROVIDER_SITE_OTHER): Payer: Medicare HMO | Admitting: Nurse Practitioner

## 2022-11-13 ENCOUNTER — Encounter (INDEPENDENT_AMBULATORY_CARE_PROVIDER_SITE_OTHER): Payer: Self-pay

## 2022-11-13 VITALS — BP 140/86 | HR 64 | Resp 16

## 2022-11-13 DIAGNOSIS — I890 Lymphedema, not elsewhere classified: Secondary | ICD-10-CM

## 2022-11-13 NOTE — Progress Notes (Signed)
History of Present Illness  There is no documented history at this time  Assessments & Plan   There are no diagnoses linked to this encounter.    Additional instructions  Subjective:  Patient presents with venous ulcer of the Bilateral lower extremity.    Procedure:  3 layer unna wrap was placed Bilateral lower extremity.   Plan:   Follow up in one week.  

## 2022-11-20 ENCOUNTER — Ambulatory Visit (INDEPENDENT_AMBULATORY_CARE_PROVIDER_SITE_OTHER): Payer: Medicare HMO | Admitting: Nurse Practitioner

## 2022-11-20 ENCOUNTER — Encounter (INDEPENDENT_AMBULATORY_CARE_PROVIDER_SITE_OTHER): Payer: Self-pay

## 2022-11-20 VITALS — BP 115/76 | HR 65 | Resp 16

## 2022-11-20 DIAGNOSIS — I890 Lymphedema, not elsewhere classified: Secondary | ICD-10-CM | POA: Diagnosis not present

## 2022-11-26 ENCOUNTER — Ambulatory Visit (INDEPENDENT_AMBULATORY_CARE_PROVIDER_SITE_OTHER): Payer: Medicare HMO | Admitting: Podiatry

## 2022-11-26 DIAGNOSIS — B353 Tinea pedis: Secondary | ICD-10-CM

## 2022-11-26 DIAGNOSIS — L97401 Non-pressure chronic ulcer of unspecified heel and midfoot limited to breakdown of skin: Secondary | ICD-10-CM

## 2022-11-26 DIAGNOSIS — L97509 Non-pressure chronic ulcer of other part of unspecified foot with unspecified severity: Secondary | ICD-10-CM | POA: Diagnosis not present

## 2022-11-26 DIAGNOSIS — E10621 Type 1 diabetes mellitus with foot ulcer: Secondary | ICD-10-CM | POA: Diagnosis not present

## 2022-11-26 MED ORDER — AMOXICILLIN-POT CLAVULANATE 875-125 MG PO TABS: 1.0000 | ORAL_TABLET | Freq: Two times a day (BID) | ORAL | 0 refills | Status: DC

## 2022-11-26 MED ORDER — KETOCONAZOLE 2 % EX CREA: 1.0000 | TOPICAL_CREAM | Freq: Every day | CUTANEOUS | 2 refills | Status: AC

## 2022-11-26 MED ORDER — GENTAMICIN SULFATE 0.1 % EX OINT: 1.0000 | TOPICAL_OINTMENT | Freq: Three times a day (TID) | CUTANEOUS | 0 refills | Status: DC

## 2022-11-26 MED ORDER — TERBINAFINE HCL 250 MG PO TABS: 250.0000 mg | ORAL_TABLET | Freq: Every day | ORAL | 0 refills | Status: DC

## 2022-11-27 ENCOUNTER — Encounter (INDEPENDENT_AMBULATORY_CARE_PROVIDER_SITE_OTHER): Payer: Medicare HMO | Admitting: Nurse Practitioner

## 2022-11-27 NOTE — Progress Notes (Signed)
  Subjective:  Patient ID: Matthew Juarez, male    DOB: 1961-01-02,  MRN: [id number]  Chief Complaint  Patient presents with   Wound Check    "I went to [facility] and they sent me here.  They said I had a wound and that there was some drainage." N - toe wound L - interdigital 2nd and 3rd D - Oct 30, 2022 O - suddenly C - drainage A - toes close together T - Vein and Vascular put some ointment on it.     62 y.o. male presents with the above complaint. History confirmed with patient.   Objective:  Physical Exam: warm, good capillary refill, no trophic changes or ulcerative lesions, normal DP and PT pulses, and hammertoe deformities bilateral, he has interdigital maceration and partial-thickness skin breakdown at the digital sulcus and in the interdigital spaces with tinea pedis.  Assessment:   1. Ulcer of heel and midfoot, unspecified laterality, limited to breakdown of skin (HCC)   2. Ulcer of forefoot due to type 1 diabetes mellitus (HCC)   3. Tinea pedis of both feet      Plan:  Patient was evaluated and treated and all questions answered.   Discussed the etiology and treatment options for tinea pedis.  Discussed topical and oral treatment.  We discussed that he has combined bacterial and tinea pedis superinfection.  Recommended topical treatment with 2% ketoconazole cream and gentamicin cream.  Also recommend oral treatment with terbinafine and Augmentin.  This was sent to the patient's pharmacy.  Also discussed appropriate foot hygiene, use of antifungal spray such as Tinactin in shoes, as well as cleaning her foot surfaces such as showers and bathroom floors with bleach.   Return in about 6 weeks (around 01/07/2023).

## 2022-11-28 MED ORDER — AMOXICILLIN-POT CLAVULANATE 875-125 MG PO TABS: 1.0000 | ORAL_TABLET | Freq: Two times a day (BID) | ORAL | 0 refills | Status: DC

## 2022-11-28 NOTE — Addendum Note (Signed)
Addended by: Kristian Covey on: 11/28/2022 06:57 AM   Modules accepted: Orders

## 2022-12-04 ENCOUNTER — Ambulatory Visit (INDEPENDENT_AMBULATORY_CARE_PROVIDER_SITE_OTHER): Payer: Medicare HMO | Admitting: Nurse Practitioner

## 2022-12-04 ENCOUNTER — Encounter (INDEPENDENT_AMBULATORY_CARE_PROVIDER_SITE_OTHER): Payer: Self-pay | Admitting: Nurse Practitioner

## 2022-12-04 VITALS — BP 155/88 | HR 56 | Resp 18 | Ht 77.0 in | Wt 312.0 lb

## 2022-12-04 DIAGNOSIS — S91309A Unspecified open wound, unspecified foot, initial encounter: Secondary | ICD-10-CM

## 2022-12-04 DIAGNOSIS — I890 Lymphedema, not elsewhere classified: Secondary | ICD-10-CM | POA: Diagnosis not present

## 2022-12-04 DIAGNOSIS — E785 Hyperlipidemia, unspecified: Secondary | ICD-10-CM | POA: Diagnosis not present

## 2022-12-07 NOTE — Progress Notes (Unsigned)
Subjective:    Patient ID: Matthew Juarez, male    DOB: 07-Aug-1960, 62 y.o.   MRN: [id number] Chief Complaint  Patient presents with  . Follow-up    Unna follow up     Matthew Juarez is a 62 year old male who returns today for follow-up evaluation of lower extremity edema.  The patient typically utilizes medical grade compression socks but recently his leg swelling has progressed to the point that he is unable to utilize them.  We have previously had the patient in Unna boots for which has been very helpful for his progression with swelling.  The additional complication he is having now is that he is having weeping from his toes.  It does appear that there is shallow wounds in the interspace between his toes, which happened to be swollen as well.   Review of Systems  Cardiovascular:  Positive for leg swelling.  Skin:  Positive for wound.  Neurological:  Positive for weakness.  All other systems reviewed and are negative.      Objective:   Physical Exam Vitals reviewed.  Constitutional:      Appearance: He is normal weight.  HENT:     Head: Normocephalic.  Cardiovascular:     Rate and Rhythm: Normal rate.  Pulmonary:     Effort: Pulmonary effort is normal.  Musculoskeletal:     Right lower leg: 2+ Pitting Edema present.     Left lower leg: 2+ Pitting Edema present.  Skin:    General: Skin is warm and dry.  Neurological:     Mental Status: He is alert. Mental status is at baseline. He is disoriented.     Motor: Weakness present.     Gait: Gait abnormal.    BP (!) 155/88 (BP Location: Left Arm)   Pulse (!) 56   Resp 18   Ht 6\' 5"  (1.956 m)   Wt (!) 312 lb (141.5 kg)   BMI 37.00 kg/m   Past Medical History:  Diagnosis Date  . Arthritis   . Cardiac arrest (HCC) 06/23/2012  . Chronic back pain   . COVID-19 04/19/2019  . Gout   . Hammer toe   . Headache(784.0)   . History of blood clots   . Hypertension   . Pulmonary embolism (HCC)   . Pulmonary infarction (HCC)  07/23/2012   Overview:  Last Assessment & Plan:  With associated R heart failure and cardiac arrest.  - you could make an argument to treat him with lifelong coumadin given the severity of his PE, although the literature supports 12 months.  - he will be treated (at least) 12 months, to be followed by Dr Renae Gloss.  - will need hypercoag w/u a month after the coumadin is stopped - restart coumadin if at any incr  . Transverse myelitis (HCC)     Social History   Socioeconomic History  . Marital status: Married    Spouse name: Not on file  . Number of children: Not on file  . Years of education: Not on file  . Highest education level: 12th grade  Occupational History  . Occupation: disability  Tobacco Use  . Smoking status: Former    Current packs/day: 0.00    Types: Cigarettes    Start date: 10/01/1982    Quit date: 04/01/1983    Years since quitting: 39.7  . Smokeless tobacco: Never  . Tobacco comments:    only smoke 2- cigs per day when he smoked  Vaping Use  .  Vaping status: Never Used  Substance and Sexual Activity  . Alcohol use: No    Alcohol/week: 0.0 standard drinks of alcohol  . Drug use: No  . Sexual activity: Not Currently  Other Topics Concern  . 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 Difficulty of Paying Living Expenses: Not hard at all  Food Insecurity: No Food Insecurity (10/14/2022)   Hunger Vital Sign   . Worried About Programme researcher, broadcasting/film/video in the Last Year: Never true   . Ran Out of Food in the Last Year: Never true  Transportation Needs: No Transportation Needs (10/14/2022)   PRAPARE - Transportation   . Lack of Transportation (Medical): No   . Lack of Transportation (Non-Medical): No  Physical Activity: Inactive (10/14/2022)   Exercise Vital Sign    . Days of Exercise per Week: 0 days   . Minutes of Exercise per Session: 0 min  Stress: No Stress Concern Present (10/14/2022)   [employer] of Occupational Health - Occupational Stress Questionnaire   . Feeling of Stress : Not at all  Social Connections: Moderately Integrated (10/14/2022)   Social Connection and Isolation Panel [NHANES]   . Frequency of Communication with Friends and Family: More than three times a week   . Frequency of Social Gatherings with Friends and Family: More than three times a week   . Attends Religious Services: More than 4 times per year   . Active Member of Clubs or Organizations: No   . Attends Banker Meetings: Never   . Marital Status: Married  Catering manager Violence: Not At Risk (10/14/2022)   Humiliation, Afraid, Rape, and Kick questionnaire   . Fear of Current or Ex-Partner: No   . Emotionally Abused: No   . Physically Abused: No   . Sexually Abused: No    Past Surgical History:  Procedure Laterality Date  . HAMMER TOE SURGERY     2016    Family History  Problem Relation Age of Onset  . Dementia Mother   . Lung cancer Maternal Uncle   . Colon cancer Maternal Aunt   . Lung cancer Maternal Aunt   . Heart disease Neg Hx     No Known Allergies     Latest Ref Rng & Units 10/13/2022    1:55 PM 12/12/2021   10:17 AM 09/27/2021   12:07 PM  CBC  WBC 4.0 - 10.5 K/uL 3.7  4.0  8.4   Hemoglobin 13.0 - 17.0 g/dL 16.1  09.6  04.5   Hematocrit 39.0 - 52.0 % 41.0  41.7  42.8   Platelets 150.0 - 400.0 K/uL 186.0  190.0  190.0       CMP     Component Value Date/Time   NA 139 10/13/2022 1355   NA 135 (L) 04/12/2012 1723   K 4.1 10/13/2022 1355   K 4.0 04/12/2012 1723   CL 104 10/13/2022 1355   CL 104 04/12/2012 1723   CO2 30 10/13/2022 1355   CO2 26 04/12/2012 1723   GLUCOSE 97 10/13/2022 1355   GLUCOSE 90 04/12/2012 1723   BUN 12 10/13/2022 1355   BUN 14 04/12/2012 1723   CREATININE 0.90 10/13/2022 1355   CREATININE  1.05  01/03/2022 1437   CALCIUM 9.3 10/13/2022 1355   CALCIUM 9.2 04/12/2012 1723   PROT 6.3 10/13/2022 1355   PROT 7.7 04/12/2012 1723   ALBUMIN 4.1 10/13/2022 1355   ALBUMIN 4.2 04/12/2012 1723   AST 14 10/13/2022 1355   AST 29 04/12/2012 1723   ALT 13 10/13/2022 1355   ALT 32 04/12/2012 1723   ALKPHOS 107 10/13/2022 1355   ALKPHOS 100 04/12/2012 1723   BILITOT 1.5 (H) 10/13/2022 1355   BILITOT 1.0 04/12/2012 1723   GFR 90.61 10/13/2022 1355   GFRNONAA >60 05/17/2019 1707   GFRNONAA >60 04/12/2012 1723     No results found.     Assessment & Plan:   1. Lymphedema We will place the patient in Unna boots today in order to help with his lower extremity edema.  I suspect some of the weeping of the toes is being driven by his edema.  He has tolerated this well in the past.  In addition to try to help with control of his lower extremity edema I feel that a lymph pump is warranted.  Recommend:  No surgery or intervention at this point in time.   The Patient is CEAP [id number].  The patient has been wearing compression for more than 12 weeks with no or little benefit.  The patient has been exercising daily for more than 12 weeks. The patient has been elevating and taking OTC pain medications for more than 12 weeks.  None of these have have eliminated the pain related to the lymphedema or the discomfort regarding excessive swelling and venous congestion.    I have reviewed my discussion with the patient regarding lymphedema and why it  causes symptoms.  Patient will continue wearing graduated compression on a daily basis. The patient should put the compression on first thing in the morning and removing them in the evening. The patient should not sleep in the compression.   In addition, behavioral modification throughout the day will be continued.  This will include frequent elevation (such as in a recliner), use of over the counter pain medications as needed and exercise such as  walking.  The systemic causes for chronic edema such as liver, kidney and cardiac etiologies do not appear to have significant changed over the past year.    The patient has chronic , severe lymphedema with hyperpigmentation of the skin and has done MLD, skin care, medication, diet, exercise, elevation and compression for 4 weeks with no improvement,  I am recommending a lymphedema pump.  The patient still has stage 3 lymphedema and therefore, I believe that a lymph pump is needed to improve the control of the patient's lymphedema and improve the quality of life.  Additionally, a lymph pump is warranted because it will reduce the risk of cellulitis and ulceration in the future.  Patient should follow-up in six months   2. Hyperlipidemia, unspecified hyperlipidemia type Continue statin as ordered and reviewed, no changes at this time  3. Open wound of foot, unspecified laterality, initial encounter Today I have cultured the area.  We have cleaned the area and placed triple antibiotic ointment with a gauze in the area.  Will also send patient to podiatrist due to patient surprised   Current Outpatient Medications on File Prior to Visit  Medication Sig Dispense Refill  . acetaminophen (TYLENOL) 500 MG tablet Take 500 mg by mouth every 6 (six) hours as needed for mild pain.    Marland Kitchen allopurinol (ZYLOPRIM) 300 MG tablet TAKE 1  TABLET BY MOUTH EVERY DAY 90 tablet 3  . amoxicillin-clavulanate (AUGMENTIN) 875-125 MG tablet Take 1 tablet by mouth 2 (two) times daily. 20 tablet 0  . atorvastatin (LIPITOR) 40 MG tablet TAKE 1 TABLET BY MOUTH EVERY DAY 90 tablet 3  . Cholecalciferol (VITAMIN D) 2000 UNITS CAPS Take 2,000 Units by mouth daily.    . Clobetasol Propionate 0.05 % lotion APPLY TO AFFECTED AREA TWICE A DAY AS NEEDED    . Coenzyme Q10 (CO Q-10) 200 MG CAPS Take 1 capsule by mouth daily. 30 each 1  . desonide (DESOWEN) 0.05 % cream   3  . esomeprazole (NEXIUM) 20 MG capsule Take 1 capsule (20 mg  total) by mouth daily before breakfast. 90 capsule 3  . Fluocinolone Acetonide Scalp 0.01 % OIL APPLY TO SCALP AT BEDTIME, WASH OUT WHEN WAKE UP  3  . gabapentin (NEURONTIN) 600 MG tablet Take 1 tablet (600 mg total) by mouth 2 (two) times daily. 180 tablet 1  . gentamicin ointment (GARAMYCIN) 0.1 % Apply 1 Application topically 3 (three) times daily. 15 g 0  . ketoconazole (NIZORAL) 2 % cream Apply 1 Application topically daily. 60 g 2  . oxyCODONE (OXY IR/ROXICODONE) 5 MG immediate release tablet Take 5 mg by mouth every 6 (six) hours as needed.    . terbinafine (LAMISIL) 250 MG tablet Take 1 tablet (250 mg total) by mouth daily. 30 tablet 0  . XARELTO 20 MG TABS tablet TAKE 1 TABLET BY MOUTH DAILY WITH SUPPER 90 tablet 3  . ciprofloxacin (CIPRO) 250 MG tablet Take 3 tablets (750 mg total) by mouth 2 (two) times daily. 60 tablet 0   No current facility-administered medications on file prior to visit.    There are no Patient Instructions on file for this visit. No follow-ups on file.   Georgiana Spinner, NP

## 2022-12-26 ENCOUNTER — Other Ambulatory Visit: Payer: Self-pay | Admitting: Family Medicine

## 2023-01-01 ENCOUNTER — Telehealth (INDEPENDENT_AMBULATORY_CARE_PROVIDER_SITE_OTHER): Payer: Self-pay

## 2023-01-01 DIAGNOSIS — Z5181 Encounter for therapeutic drug level monitoring: Secondary | ICD-10-CM | POA: Diagnosis not present

## 2023-01-01 DIAGNOSIS — G8929 Other chronic pain: Secondary | ICD-10-CM | POA: Diagnosis not present

## 2023-01-01 DIAGNOSIS — M25512 Pain in left shoulder: Secondary | ICD-10-CM | POA: Diagnosis not present

## 2023-01-01 DIAGNOSIS — Z79891 Long term (current) use of opiate analgesic: Secondary | ICD-10-CM | POA: Diagnosis not present

## 2023-01-01 DIAGNOSIS — G894 Chronic pain syndrome: Secondary | ICD-10-CM | POA: Diagnosis not present

## 2023-01-01 NOTE — Telephone Encounter (Signed)
Matthew Juarez called stating he has two companies contacting him and fitting him for pumps. Both companies stated they were referred by [facility]. Should he go with [facility] or [facility]?

## 2023-01-02 NOTE — Telephone Encounter (Signed)
I received an email from [facility] stating that he was going to go with them.  I would just call and confirm with the patient

## 2023-01-06 ENCOUNTER — Ambulatory Visit: Payer: Medicare HMO | Admitting: Family Medicine

## 2023-01-07 ENCOUNTER — Ambulatory Visit: Payer: BC Managed Care – PPO | Admitting: Podiatry

## 2023-01-07 ENCOUNTER — Telehealth: Payer: Self-pay

## 2023-01-07 MED ORDER — CLINDAMYCIN HCL 300 MG PO CAPS: 300.0000 mg | ORAL_CAPSULE | Freq: Three times a day (TID) | ORAL | 1 refills | Status: DC

## 2023-01-07 MED ORDER — FLUCONAZOLE 150 MG PO TABS: 150.0000 mg | ORAL_TABLET | ORAL | 0 refills | Status: DC

## 2023-01-07 NOTE — Addendum Note (Signed)
Addended byLilian Kapur, Parneet Glantz R on: 01/07/2023 12:25 PM   Modules accepted: Orders

## 2023-01-07 NOTE — Telephone Encounter (Signed)
Patient called and left a message. Stated that the prescription cream is not working, that he wants something stronger. He also would like oral antibiotics for infection. Please advise.  Thanks

## 2023-01-14 ENCOUNTER — Ambulatory Visit: Payer: Medicare HMO | Admitting: Family Medicine

## 2023-01-15 DIAGNOSIS — L509 Urticaria, unspecified: Secondary | ICD-10-CM | POA: Diagnosis not present

## 2023-01-19 ENCOUNTER — Telehealth: Payer: Self-pay | Admitting: Podiatry

## 2023-01-19 NOTE — Telephone Encounter (Signed)
Patient called.  Has broken out in a rash all over his body.  Went to [facility].  They think the clindamycin broke him out.  [facility] gave patient a steroid shot and prescribed prednisone.  HE finishes the prednisone on 01/20/23.  He still has some of the rash and is asking do you need to give him something else to take.

## 2023-01-26 ENCOUNTER — Encounter: Payer: Self-pay | Admitting: Family Medicine

## 2023-01-26 ENCOUNTER — Ambulatory Visit (INDEPENDENT_AMBULATORY_CARE_PROVIDER_SITE_OTHER): Payer: Medicare HMO | Admitting: Family Medicine

## 2023-01-26 ENCOUNTER — Ambulatory Visit (INDEPENDENT_AMBULATORY_CARE_PROVIDER_SITE_OTHER): Payer: Medicare HMO | Admitting: Podiatry

## 2023-01-26 ENCOUNTER — Ambulatory Visit: Payer: BC Managed Care – PPO | Admitting: Podiatry

## 2023-01-26 VITALS — BP 152/80 | HR 62

## 2023-01-26 VITALS — BP 118/74 | HR 66 | Temp 98.0°F | Ht 77.0 in

## 2023-01-26 DIAGNOSIS — Z125 Encounter for screening for malignant neoplasm of prostate: Secondary | ICD-10-CM

## 2023-01-26 DIAGNOSIS — B353 Tinea pedis: Secondary | ICD-10-CM

## 2023-01-26 DIAGNOSIS — Z86711 Personal history of pulmonary embolism: Secondary | ICD-10-CM

## 2023-01-26 DIAGNOSIS — R600 Localized edema: Secondary | ICD-10-CM | POA: Diagnosis not present

## 2023-01-26 DIAGNOSIS — Z23 Encounter for immunization: Secondary | ICD-10-CM | POA: Diagnosis not present

## 2023-01-26 DIAGNOSIS — G360 Neuromyelitis optica [Devic]: Secondary | ICD-10-CM | POA: Diagnosis not present

## 2023-01-26 DIAGNOSIS — M1A0710 Idiopathic chronic gout, right ankle and foot, without tophus (tophi): Secondary | ICD-10-CM | POA: Diagnosis not present

## 2023-01-26 NOTE — Addendum Note (Signed)
Addended by: Prince Solian A on: 01/26/2023 04:06 PM   Modules accepted: Orders

## 2023-01-26 NOTE — Progress Notes (Signed)
[name], MD Phone: [phone]  Matthew Juarez is a 62 y.o. male who presents today for follow-up.  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  He continues to follow with neurology.  History of PE: Continues on Xarelto.  No bleeding.  No swelling.  No chest pain or shortness of breath.  Gout: No recent flares.  He continues on allopurinol.  Allergic reaction: Patient notes he developed rash after taking clindamycin.  He went to urgent care and they advised him to stop the clindamycin and they placed him on steroids.  He notes his skin is peeling after the rash has resolved.  Social History   Tobacco Use  Smoking Status Former   Current packs/day: 0.00   Types: Cigarettes   Start date: 10/01/1982   Quit date: 04/01/1983   Years since quitting: 39.8  Smokeless Tobacco Never  Tobacco Comments   only smoke 2- cigs per day when he smoked    Current Outpatient Medications on File Prior to Visit  Medication Sig Dispense Refill   acetaminophen (TYLENOL) 500 MG tablet Take 500 mg by mouth every 6 (six) hours as needed for mild pain.     allopurinol (ZYLOPRIM) 300 MG tablet TAKE 1 TABLET BY MOUTH EVERY DAY 90 tablet 3   atorvastatin (LIPITOR) 40 MG tablet TAKE 1 TABLET BY MOUTH EVERY DAY 90 tablet 3   Cholecalciferol (VITAMIN D) 2000 UNITS CAPS Take 2,000 Units by mouth daily.     Clobetasol Propionate 0.05 % lotion APPLY TO AFFECTED AREA TWICE A DAY AS NEEDED     Coenzyme Q10 (CO Q-10) 200 MG CAPS Take 1 capsule by mouth daily. 30 each 1   desonide (DESOWEN) 0.05 % cream   3   esomeprazole (NEXIUM) 20 MG capsule Take 1 capsule (20 mg total) by mouth daily before breakfast. 90 capsule 3   Fluocinolone Acetonide Scalp 0.01 % OIL APPLY TO SCALP AT BEDTIME, WASH OUT WHEN WAKE UP  3   gabapentin (NEURONTIN) 600 MG tablet Take 1 tablet (600 mg total) by mouth 2 (two) times daily. 180 tablet 1   gentamicin ointment (GARAMYCIN) 0.1 % Apply 1 Application topically  3 (three) times daily. 15 g 0   ketoconazole (NIZORAL) 2 % cream Apply 1 Application topically daily. 60 g 2   oxyCODONE (OXY IR/ROXICODONE) 5 MG immediate release tablet Take 5 mg by mouth every 6 (six) hours as needed.     XARELTO 20 MG TABS tablet TAKE 1 TABLET BY MOUTH DAILY WITH SUPPER 90 tablet 3   No current facility-administered medications on file prior to visit.     ROS see history of present illness  Objective  Physical Exam Vitals:   01/26/23 1513  BP: 118/74  Pulse: 66  Temp: 98 F (36.7 C)  SpO2: 98%    BP Readings from Last 3 Encounters:  01/26/23 118/74  12/04/22 (!) 155/88  11/20/22 115/76   Wt Readings from Last 3 Encounters:  12/04/22 (!) 312 lb (141.5 kg)  11/06/22 (!) 312 lb (141.5 kg)  10/14/22 (!) 312 lb (141.5 kg)    Physical Exam Constitutional:      General: He is not in acute distress.    Appearance: He is not diaphoretic.  Cardiovascular:     Rate and Rhythm: Normal rate and regular rhythm.     Heart sounds: Normal heart sounds.  Pulmonary:     Effort: Pulmonary effort is normal.     Breath sounds: Normal breath sounds.  Skin:  General: Skin is warm and dry.     Comments: Some skin peeling over his torso and upper arms bilaterally, no skin peeling with finger pressure on the skin  Neurological:     Mental Status: He is alert.      Assessment/Plan: Please see individual problem list.  Neuromyelitis optica (HCC) Assessment & Plan: Chronic issue.  Stable.  Patient will continue to follow with neurology.   Bilateral lower extremity edema Assessment & Plan: Chronic issue.  Much improved.  Feet have improved.  Patient will continue compression stockings and continue to see podiatry and vascular surgery.   Idiopathic chronic gout of right foot without tophus Assessment & Plan: Chronic issue.  Asymptomatic.  Patient will continue allopurinol 300 mg daily.  Check labs.  Orders: -     Uric acid -     CBC with  Differential/Platelet  History of pulmonary embolism Assessment & Plan: Chronic issue.  Patient will continue Xarelto 20 mg daily.  Orders: -     CBC with Differential/Platelet  Prostate cancer screening -     PSA, Medicare   Return in about 6 months (around 07/27/2023) for transfer of care.   [name], MD [facility]

## 2023-01-26 NOTE — Assessment & Plan Note (Signed)
Chronic issue.  Stable.  Patient will continue to follow with neurology.

## 2023-01-26 NOTE — Assessment & Plan Note (Signed)
Chronic issue.  Asymptomatic.  Patient will continue allopurinol 300 mg daily.  Check labs.

## 2023-01-26 NOTE — Assessment & Plan Note (Signed)
Chronic issue.  Patient will continue Xarelto 20 mg daily.

## 2023-01-26 NOTE — Assessment & Plan Note (Signed)
Chronic issue.  Much improved.  Feet have improved.  Patient will continue compression stockings and continue to see podiatry and vascular surgery.

## 2023-01-27 LAB — CBC WITH DIFFERENTIAL/PLATELET
Basophils Absolute: 0.1 10*3/uL (ref 0.0–0.1)
Basophils Relative: 1.1 % (ref 0.0–3.0)
Eosinophils Absolute: 0.2 10*3/uL (ref 0.0–0.7)
Eosinophils Relative: 3.3 % (ref 0.0–5.0)
HCT: 44 % (ref 39.0–52.0)
Hemoglobin: 14 g/dL (ref 13.0–17.0)
Lymphocytes Relative: 20.8 % (ref 12.0–46.0)
Lymphs Abs: 1.2 10*3/uL (ref 0.7–4.0)
MCHC: 31.7 g/dL (ref 30.0–36.0)
MCV: 89.2 fL (ref 78.0–100.0)
Monocytes Absolute: 0.4 10*3/uL (ref 0.1–1.0)
Monocytes Relative: 7.1 % (ref 3.0–12.0)
Neutro Abs: 4 10*3/uL (ref 1.4–7.7)
Neutrophils Relative %: 67.7 % (ref 43.0–77.0)
Platelets: 201 10*3/uL (ref 150.0–400.0)
RBC: 4.93 Mil/uL (ref 4.22–5.81)
RDW: 14 % (ref 11.5–15.5)
WBC: 5.9 10*3/uL (ref 4.0–10.5)

## 2023-01-27 LAB — URIC ACID: Uric Acid, Serum: 4.5 mg/dL (ref 4.0–7.8)

## 2023-01-27 LAB — PSA, MEDICARE: PSA: 0.77 ng/mL (ref 0.10–4.00)

## 2023-01-27 NOTE — Progress Notes (Signed)
  Subjective:  Patient ID: Matthew Juarez, male    DOB: [birth year],  MRN: [id number]  Chief Complaint  Patient presents with   Wound Check    "It's doing good.  That last medicine he prescribed me broke me out.  It starts with a "C"."    62 y.o. male presents with the above complaint. History confirmed with patient.  He notes is doing much better he has been taking care of it as directed.  He had a rash breakout from the clindamycin  Objective:  Physical Exam: warm, good capillary refill, no trophic changes or ulcerative lesions, normal DP and PT pulses, and hammertoe deformities bilateral, interdigital maceration has greatly improved there is minimal skin breakdown and improvement in the tinea pedis Assessment:   1. Tinea pedis of both feet      Plan:  Patient was evaluated and treated and all questions answered.  Doing much better I do not think that further antifungal oral therapy or oral antibiotics are necessary at this point considering the significant reactions he had as well.  Clindamycin added to hypersensitivity allergy list as a rash.  Recommend he continue to use the gentamicin ointment once a day and Betadine another part of the day.  I will see him back in about a month to reevaluate   Return in about 5 weeks (around 03/02/2023) for follow up on tinea pedis.

## 2023-02-06 ENCOUNTER — Other Ambulatory Visit: Payer: Self-pay | Admitting: Family Medicine

## 2023-02-28 DIAGNOSIS — G360 Neuromyelitis optica [Devic]: Secondary | ICD-10-CM | POA: Diagnosis not present

## 2023-03-02 ENCOUNTER — Telehealth (INDEPENDENT_AMBULATORY_CARE_PROVIDER_SITE_OTHER): Payer: Self-pay | Admitting: Nurse Practitioner

## 2023-03-02 NOTE — Telephone Encounter (Signed)
Patient has Chronic Lymphedema and is being re-evaluated today following a 4-week trial of daily compression, elevation, and regular exercise. Their Lymphedema has not improved, and symptoms persist. They were trialed on an E0651 device and did not receive satisfactory outcomes. Due to the patients significant symptoms and unique characteristics of fibrotic tissue and significant pain from fluid accumulation. the patient will need the advanced E0652 with manual programmability and appropriate garments to better treat their lymphedema.  Measurements: Left ankle 27.5cm (9/18); 27.8cm(10/16) Right ankle 27.2cm(9/18) 27.3cm(10/16) Left calf 49.6cm(9/18); 49.8cm(10/16) Right calf 45.1cm(9/18); 45.4cm(10/16)  Matthew Spinner, NP

## 2023-03-04 ENCOUNTER — Ambulatory Visit (INDEPENDENT_AMBULATORY_CARE_PROVIDER_SITE_OTHER): Payer: BC Managed Care – PPO | Admitting: Podiatry

## 2023-03-04 DIAGNOSIS — Z91199 Patient's noncompliance with other medical treatment and regimen due to unspecified reason: Secondary | ICD-10-CM

## 2023-03-04 NOTE — Progress Notes (Signed)
Patient was no-show for appointment today 

## 2023-03-05 ENCOUNTER — Ambulatory Visit (INDEPENDENT_AMBULATORY_CARE_PROVIDER_SITE_OTHER): Payer: Medicare HMO | Admitting: Nurse Practitioner

## 2023-03-06 ENCOUNTER — Other Ambulatory Visit: Payer: Self-pay | Admitting: Podiatry

## 2023-03-27 DIAGNOSIS — G8929 Other chronic pain: Secondary | ICD-10-CM | POA: Diagnosis not present

## 2023-03-27 DIAGNOSIS — M25512 Pain in left shoulder: Secondary | ICD-10-CM | POA: Diagnosis not present

## 2023-03-27 DIAGNOSIS — G894 Chronic pain syndrome: Secondary | ICD-10-CM | POA: Diagnosis not present

## 2023-03-31 ENCOUNTER — Encounter (INDEPENDENT_AMBULATORY_CARE_PROVIDER_SITE_OTHER): Payer: Self-pay | Admitting: Nurse Practitioner

## 2023-03-31 ENCOUNTER — Ambulatory Visit (INDEPENDENT_AMBULATORY_CARE_PROVIDER_SITE_OTHER): Payer: Medicare HMO | Admitting: Nurse Practitioner

## 2023-03-31 VITALS — BP 181/90 | HR 62 | Resp 16

## 2023-03-31 DIAGNOSIS — L03119 Cellulitis of unspecified part of limb: Secondary | ICD-10-CM

## 2023-03-31 DIAGNOSIS — E785 Hyperlipidemia, unspecified: Secondary | ICD-10-CM

## 2023-03-31 DIAGNOSIS — I890 Lymphedema, not elsewhere classified: Secondary | ICD-10-CM | POA: Diagnosis not present

## 2023-04-06 ENCOUNTER — Encounter: Payer: Self-pay | Admitting: Podiatry

## 2023-04-06 ENCOUNTER — Encounter (INDEPENDENT_AMBULATORY_CARE_PROVIDER_SITE_OTHER): Payer: Self-pay | Admitting: Nurse Practitioner

## 2023-04-06 ENCOUNTER — Ambulatory Visit (INDEPENDENT_AMBULATORY_CARE_PROVIDER_SITE_OTHER): Payer: Medicare HMO | Admitting: Podiatry

## 2023-04-06 DIAGNOSIS — B353 Tinea pedis: Secondary | ICD-10-CM | POA: Diagnosis not present

## 2023-04-06 NOTE — Progress Notes (Signed)
  Subjective:  Patient ID: Matthew Juarez, male    DOB: 1960-08-25,  MRN: [id number]  Chief Complaint  Patient presents with   Tinea Pedis    They doing pretty good.  I think I still have a little wetness in between the toes.    63 y.o. male presents with the above complaint. History confirmed with patient.     Objective:  Physical Exam: warm, good capillary refill, no trophic changes or ulcerative lesions, normal DP and PT pulses, and hammertoe deformities bilateral, interdigital maceration has greatly improved there is minimal skin breakdown and improvement in the tinea pedis still some small areas Assessment:   1. Tinea pedis of both feet      Plan:  Patient was evaluated and treated and all questions answered.  Doing better he should continue to use the gentamicin  ointment once daily I have recommended he also switch from Betadine to Castellani's paint if he can find it once daily.  Follow-up with me in about 3 months to reevaluate sooner if there are issues.   Return in about 3 months (around 07/05/2023) for f/u tinea pedis.

## 2023-04-06 NOTE — Patient Instructions (Signed)
 Look for Castellani's pain to use between the toes instead of the betadine

## 2023-04-06 NOTE — Progress Notes (Signed)
 Subjective:    Patient ID: Matthew Juarez, male    DOB: [birth year], 63 y.o.   MRN: [id number] Chief Complaint  Patient presents with   Follow-up    3 month follow up     The patient returns to the office for followup evaluation regarding leg swelling.  The swelling has improved quite a bit and the pain associated with swelling has decreased substantially. There have not been any interval development of a ulcerations or wounds.  Since the previous visit the patient has been wearing graduated compression stockings and has noted some improvement in the lymphedema. The patient has been using compression routinely morning until night.  The patient also states elevation during the day and exercise (such as walking) is being done too.       Review of Systems  Cardiovascular:  Positive for leg swelling.  All other systems reviewed and are negative.      Objective:   Physical Exam Vitals reviewed.  HENT:     Head: Normocephalic.  Cardiovascular:     Rate and Rhythm: Normal rate.  Pulmonary:     Effort: Pulmonary effort is normal.  Musculoskeletal:     Right lower leg: Edema present.     Left lower leg: Edema present.  Skin:    General: Skin is warm and dry.  Neurological:     Mental Status: He is alert and oriented to person, place, and time.  Psychiatric:        Mood and Affect: Mood normal.        Behavior: Behavior normal.        Thought Content: Thought content normal.        Judgment: Judgment normal.     BP (!) 181/90   Pulse 62   Resp 16   Past Medical History:  Diagnosis Date   Arthritis    Cardiac arrest (HCC) 06/23/2012   Chronic back pain    COVID-19 04/19/2019   Gout    Hammer toe    Headache(784.0)    History of blood clots    Hypertension    Pulmonary embolism (HCC)    Pulmonary infarction (HCC) 07/23/2012   Overview:  Last Assessment & Plan:  With associated R heart failure and cardiac arrest.  - you could make an argument to treat him with  lifelong coumadin  given the severity of his PE, although the literature supports 12 months.  - he will be treated (at least) 12 months, to be followed by Dr Theo.  - will need hypercoag w/u a month after the coumadin  is stopped - restart coumadin  if at any incr   Transverse myelitis (HCC)     Social History   Socioeconomic History   Marital status: Married    Spouse name: Not on file   Number of children: Not on file   Years of education: Not on file   Highest education level: 12th grade  Occupational History   Occupation: disability  Tobacco Use   Smoking status: Former    Current packs/day: 0.00    Types: Cigarettes    Start date: 10/01/1982    Quit date: 04/01/1983    Years since quitting: 40.0   Smokeless tobacco: Never   Tobacco comments:    only smoke 2- cigs per day when he smoked  Vaping Use   Vaping status: Never Used  Substance and Sexual Activity   Alcohol use: No    Alcohol/week: 0.0 standard drinks of alcohol   Drug use:  No   Sexual activity: Not Currently  Other Topics Concern   Not on file  Social History Narrative   Married    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    From [place] went to [employer]   Social Drivers of Health   Financial Resource Strain: Low Risk  (01/25/2023)   Overall Financial Resource Strain (CARDIA)    Difficulty of Paying Living Expenses: Not hard at all  Food Insecurity: No Food Insecurity (01/25/2023)   Hunger Vital Sign    Worried About Running Out of Food in the Last Year: Never true    Ran Out of Food in the Last Year: Never true  Transportation Needs: No Transportation Needs (01/25/2023)   PRAPARE - Administrator, Civil Service (Medical): No    Lack of Transportation (Non-Medical): No  Physical Activity: Inactive (01/25/2023)   Exercise Vital Sign    Days of Exercise per Week: 0 days    Minutes of Exercise per Session: 0 min  Stress: No Stress Concern Present (01/25/2023)   [employer] of Occupational  Health - Occupational Stress Questionnaire    Feeling of Stress : Not at all  Social Connections: Moderately Integrated (01/25/2023)   Social Connection and Isolation Panel [NHANES]    Frequency of Communication with Friends and Family: Three times a week    Frequency of Social Gatherings with Friends and Family: Once a week    Attends Religious Services: More than 4 times per year    Active Member of Clubs or Organizations: No    Attends Banker Meetings: Never    Marital Status: Married  Catering Manager Violence: Not At Risk (10/14/2022)   Humiliation, Afraid, Rape, and Kick questionnaire    Fear of Current or Ex-Partner: No    Emotionally Abused: No    Physically Abused: No    Sexually Abused: No    Past Surgical History:  Procedure Laterality Date   HAMMER TOE SURGERY     2016    Family History  Problem Relation Age of Onset   Dementia Mother    Lung cancer Maternal Uncle    Colon cancer Maternal Aunt    Lung cancer Maternal Aunt    Heart disease Neg Hx     Allergies  Allergen Reactions   Clindamycin /Lincomycin Rash       Latest Ref Rng & Units 01/26/2023    3:08 PM 10/13/2022    1:55 PM 12/12/2021   10:17 AM  CBC  WBC 4.0 - 10.5 K/uL 5.9  3.7  4.0   Hemoglobin 13.0 - 17.0 g/dL 85.9  86.5  86.2   Hematocrit 39.0 - 52.0 % 44.0  41.0  41.7   Platelets 150.0 - 400.0 K/uL 201.0  186.0  190.0       CMP     Component Value Date/Time   NA 139 10/13/2022 1355   NA 135 (L) 04/12/2012 1723   K 4.1 10/13/2022 1355   K 4.0 04/12/2012 1723   CL 104 10/13/2022 1355   CL 104 04/12/2012 1723   CO2 30 10/13/2022 1355   CO2 26 04/12/2012 1723   GLUCOSE 97 10/13/2022 1355   GLUCOSE 90 04/12/2012 1723   BUN 12 10/13/2022 1355   BUN 14 04/12/2012 1723   CREATININE 0.90 10/13/2022 1355   CREATININE 1.05 01/03/2022 1437   CALCIUM  9.3 10/13/2022 1355   CALCIUM  9.2 04/12/2012 1723   PROT 6.3 10/13/2022 1355   PROT 7.7 04/12/2012  1723   ALBUMIN  4.1  10/13/2022 1355   ALBUMIN  4.2 04/12/2012 1723   AST 14 10/13/2022 1355   AST 29 04/12/2012 1723   ALT 13 10/13/2022 1355   ALT 32 04/12/2012 1723   ALKPHOS 107 10/13/2022 1355   ALKPHOS 100 04/12/2012 1723   BILITOT 1.5 (H) 10/13/2022 1355   BILITOT 1.0 04/12/2012 1723   GFR 90.61 10/13/2022 1355   GFRNONAA >60 05/17/2019 1707   GFRNONAA >60 04/12/2012 1723     No results found.     Assessment & Plan:   1. Lymphedema (Primary) I am hopeful that he will receive his lymphedema pump soon.  He notes all paperwork has been completed.  I do believe this will be an excellent factor in his care.  Will plan to have him return in 6 months  2. Hyperlipidemia, unspecified hyperlipidemia type Continue statin as ordered and reviewed, no changes at this time  3. Cellulitis of lower extremity, unspecified laterality No evidence of cellulitis today   Current Outpatient Medications on File Prior to Visit  Medication Sig Dispense Refill   acetaminophen  (TYLENOL ) 500 MG tablet Take 500 mg by mouth every 6 (six) hours as needed for mild pain.     allopurinol  (ZYLOPRIM ) 300 MG tablet TAKE 1 TABLET BY MOUTH EVERY DAY 90 tablet 3   atorvastatin  (LIPITOR) 40 MG tablet TAKE 1 TABLET BY MOUTH EVERY DAY 90 tablet 3   Cholecalciferol (VITAMIN D) 2000 UNITS CAPS Take 2,000 Units by mouth daily.     Clobetasol Propionate 0.05 % lotion APPLY TO AFFECTED AREA TWICE A DAY AS NEEDED     Coenzyme Q10 (CO Q-10) 200 MG CAPS Take 1 capsule by mouth daily. 30 each 1   desonide (DESOWEN) 0.05 % cream   3   esomeprazole  (NEXIUM ) 20 MG capsule Take 1 capsule (20 mg total) by mouth daily before breakfast. 90 capsule 3   Fluocinolone Acetonide Scalp 0.01 % OIL APPLY TO SCALP AT BEDTIME, WASH OUT WHEN WAKE UP  3   gabapentin  (NEURONTIN ) 600 MG tablet Take 1 tablet (600 mg total) by mouth 2 (two) times daily. 180 tablet 1   gentamicin  ointment (GARAMYCIN ) 0.1 % APPLY TOPICALLY 3 TIMES A DAY 15 g 0   ketoconazole   (NIZORAL ) 2 % cream Apply 1 Application topically daily. 60 g 2   oxyCODONE  (OXY IR/ROXICODONE ) 5 MG immediate release tablet Take 5 mg by mouth every 6 (six) hours as needed.     XARELTO  20 MG TABS tablet TAKE 1 TABLET BY MOUTH DAILY WITH SUPPER 90 tablet 3   No current facility-administered medications on file prior to visit.    There are no Patient Instructions on file for this visit. No follow-ups on file.   Raymona Boss E Carlin Attridge, NP

## 2023-05-18 DIAGNOSIS — G360 Neuromyelitis optica [Devic]: Secondary | ICD-10-CM | POA: Diagnosis not present

## 2023-05-18 DIAGNOSIS — G9589 Other specified diseases of spinal cord: Secondary | ICD-10-CM | POA: Diagnosis not present

## 2023-05-20 ENCOUNTER — Other Ambulatory Visit: Payer: Self-pay | Admitting: Podiatry

## 2023-05-25 DIAGNOSIS — R269 Unspecified abnormalities of gait and mobility: Secondary | ICD-10-CM | POA: Diagnosis not present

## 2023-05-25 DIAGNOSIS — G360 Neuromyelitis optica [Devic]: Secondary | ICD-10-CM | POA: Diagnosis not present

## 2023-05-31 ENCOUNTER — Other Ambulatory Visit: Payer: Self-pay | Admitting: Podiatry

## 2023-07-08 ENCOUNTER — Ambulatory Visit: Payer: 59 | Admitting: Podiatry

## 2023-07-24 ENCOUNTER — Ambulatory Visit (INDEPENDENT_AMBULATORY_CARE_PROVIDER_SITE_OTHER): Payer: Medicare HMO | Admitting: Nurse Practitioner

## 2023-07-27 ENCOUNTER — Encounter: Payer: Medicare HMO | Admitting: Family Medicine

## 2023-08-13 ENCOUNTER — Other Ambulatory Visit: Payer: Self-pay | Admitting: Podiatry

## 2023-08-18 ENCOUNTER — Encounter (INDEPENDENT_AMBULATORY_CARE_PROVIDER_SITE_OTHER): Payer: Self-pay

## 2023-09-02 ENCOUNTER — Encounter: Payer: Self-pay | Admitting: Podiatry

## 2023-09-02 ENCOUNTER — Ambulatory Visit (INDEPENDENT_AMBULATORY_CARE_PROVIDER_SITE_OTHER): Admitting: Podiatry

## 2023-09-02 DIAGNOSIS — L081 Erythrasma: Secondary | ICD-10-CM

## 2023-09-02 DIAGNOSIS — B353 Tinea pedis: Secondary | ICD-10-CM

## 2023-09-02 MED ORDER — TERBINAFINE HCL 250 MG PO TABS: 250.0000 mg | ORAL_TABLET | Freq: Every day | ORAL | 0 refills | Status: DC

## 2023-09-02 MED ORDER — AMOXICILLIN-POT CLAVULANATE 875-125 MG PO TABS: 1.0000 | ORAL_TABLET | Freq: Two times a day (BID) | ORAL | 0 refills | Status: AC

## 2023-09-03 NOTE — Progress Notes (Signed)
  Subjective:  Patient ID: Matthew Juarez, male    DOB: Aug 23, 1960,  MRN: [id number]  Chief Complaint  Patient presents with   Tinea Pedis    "I don't know what to do with these feet.  I been using the ointment that he told me to use.  I use a lymphedema pump every day."    63 y.o. male presents with the above complaint. History confirmed with patient.  He notes it has been again he has been using gentamicin  and ketoconazole   Objective:  Physical Exam: warm, good capillary refill, no trophic changes or ulcerative lesions, normal DP and PT pulses, and hammertoe deformities bilateral, interdigital maceration has returned a  1. Tinea pedis of both feet   2. Erythrasma      Plan:  Patient was evaluated and treated and all questions answered.  I placed him on Augmentin  and terbinafine  for 1 month, recommend he continue using the gentamicin  and ketoconazole  once a day and Betadine the other part of the day for the next week.  Discussed letting the feet dry out and foot hygiene which he is working his best at.  Return in 6 weeks to reevaluate.  May be a chronic issue for him.   Return in about 6 weeks (around 10/14/2023) for f/u tinea pedis.

## 2023-09-21 ENCOUNTER — Encounter

## 2023-09-22 ENCOUNTER — Ambulatory Visit: Payer: Self-pay

## 2023-09-22 ENCOUNTER — Ambulatory Visit

## 2023-09-22 VITALS — BP 110/70 | HR 71 | Temp 98.2°F | Ht 77.0 in | Wt 301.8 lb

## 2023-09-22 DIAGNOSIS — M62838 Other muscle spasm: Secondary | ICD-10-CM

## 2023-09-22 DIAGNOSIS — E785 Hyperlipidemia, unspecified: Secondary | ICD-10-CM | POA: Diagnosis not present

## 2023-09-22 DIAGNOSIS — Z86711 Personal history of pulmonary embolism: Secondary | ICD-10-CM

## 2023-09-22 DIAGNOSIS — Z79899 Other long term (current) drug therapy: Secondary | ICD-10-CM | POA: Diagnosis not present

## 2023-09-22 DIAGNOSIS — R7303 Prediabetes: Secondary | ICD-10-CM

## 2023-09-22 DIAGNOSIS — M1A0710 Idiopathic chronic gout, right ankle and foot, without tophus (tophi): Secondary | ICD-10-CM

## 2023-09-22 DIAGNOSIS — E66812 Obesity, class 2: Secondary | ICD-10-CM

## 2023-09-22 DIAGNOSIS — R600 Localized edema: Secondary | ICD-10-CM

## 2023-09-22 DIAGNOSIS — G360 Neuromyelitis optica [Devic]: Secondary | ICD-10-CM

## 2023-09-22 DIAGNOSIS — K219 Gastro-esophageal reflux disease without esophagitis: Secondary | ICD-10-CM

## 2023-09-22 DIAGNOSIS — G894 Chronic pain syndrome: Secondary | ICD-10-CM

## 2023-09-22 DIAGNOSIS — Z6835 Body mass index (BMI) 35.0-35.9, adult: Secondary | ICD-10-CM

## 2023-09-22 HISTORY — DX: Other muscle spasm: M62.838

## 2023-09-22 LAB — HEMOGLOBIN A1C: Hgb A1c MFr Bld: 5.9 % (ref 4.6–6.5)

## 2023-09-22 LAB — COMPREHENSIVE METABOLIC PANEL WITH GFR
ALT: 18 U/L (ref 0–53)
AST: 19 U/L (ref 0–37)
Albumin: 4.2 g/dL (ref 3.5–5.2)
Alkaline Phosphatase: 106 U/L (ref 39–117)
BUN: 11 mg/dL (ref 6–23)
CO2: 30 meq/L (ref 19–32)
Calcium: 9.1 mg/dL (ref 8.4–10.5)
Chloride: 104 meq/L (ref 96–112)
Creatinine, Ser: 0.84 mg/dL (ref 0.40–1.50)
GFR: 91.91 mL/min (ref >60.00)
Glucose, Bld: 101 mg/dL — ABNORMAL HIGH (ref 70–99)
Potassium: 3.7 meq/L (ref 3.5–5.1)
Sodium: 139 meq/L (ref 135–145)
Total Bilirubin: 1.3 mg/dL — ABNORMAL HIGH (ref 0.2–1.2)
Total Protein: 6.3 g/dL (ref 6.0–8.3)

## 2023-09-22 LAB — URIC ACID: Uric Acid, Serum: 3.8 mg/dL — ABNORMAL LOW (ref 4.0–7.8)

## 2023-09-22 LAB — LIPID PANEL
Cholesterol: 136 mg/dL (ref 0–200)
HDL: 40.5 mg/dL (ref >39.00)
LDL Cholesterol: 59 mg/dL (ref 0–99)
NonHDL: 95.03
Total CHOL/HDL Ratio: 3
Triglycerides: 181 mg/dL — ABNORMAL HIGH (ref 0.0–149.0)
VLDL: 36.2 mg/dL (ref 0.0–40.0)

## 2023-09-22 LAB — VITAMIN B12: Vitamin B-12: 324 pg/mL (ref 211–911)

## 2023-09-22 MED ORDER — RIVAROXABAN 20 MG PO TABS: 20.0000 mg | ORAL_TABLET | Freq: Every day | ORAL | 3 refills | Status: AC

## 2023-09-22 MED ORDER — ESOMEPRAZOLE MAGNESIUM 20 MG PO CPDR: 20.0000 mg | DELAYED_RELEASE_CAPSULE | Freq: Every day | ORAL | 3 refills | Status: AC

## 2023-09-22 MED ORDER — ATORVASTATIN CALCIUM 40 MG PO TABS: 40.0000 mg | ORAL_TABLET | Freq: Every day | ORAL | 3 refills | Status: AC

## 2023-09-22 MED ORDER — ALLOPURINOL 300 MG PO TABS: 300.0000 mg | ORAL_TABLET | Freq: Every day | ORAL | 3 refills | Status: AC

## 2023-09-22 MED ORDER — GABAPENTIN 600 MG PO TABS: 600.0000 mg | ORAL_TABLET | Freq: Two times a day (BID) | ORAL | 1 refills | Status: DC

## 2023-09-22 NOTE — Assessment & Plan Note (Addendum)
 Plan per prediabetes.  If despite extensive lifestyle modifications unable to achieve weight loss will consider GLP-1 group medication.

## 2023-09-22 NOTE — Patient Instructions (Signed)
--   Extensive lifestyle modification with daily exercise, cutting down on sweets, sugary products, reducing intake of unhealthy food and adding fiber rich, whole grain, fresh vegetables, fruits to your diet.

## 2023-09-22 NOTE — Assessment & Plan Note (Signed)
 History of right lower leg DVT and pulmonary embolism, currently managed on Xarelto  20 mg daily. Continue.  No evidence of recurrent thromboembolism. No adverse effects from anticoagulation therapy.

## 2023-09-22 NOTE — Progress Notes (Signed)
 Established Patient Office Visit TOC from Dr. Maribeth (last OV with him was on 01/26/23)    Subjective  Patient ID: Matthew Juarez, male    DOB: 1961-01-25  Age: 63 y.o. MRN: [id number]  Chief Complaint  Patient presents with   Establish Care    He  has a past medical history of Arthritis, Cardiac arrest (HCC) (06/23/2012), Chronic back pain, Closed right ankle fracture (04/19/2012), COVID-19 (04/19/2019), Gout, Hammer toe, Headache(784.0), History of blood clots, Hypertension, Pulmonary embolism (HCC), Pulmonary infarction (HCC) (07/23/2012), and Transverse myelitis (HCC).  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 Limited mobility and relies on a wheelchair for daily activities.   2) Chronic pain of left shoulder: Established with [facility] (upcoming appointment on 09/25/23) with Krystal Cornell PA. Currently on Oxycodone  5 mg BID prn with recommendation to include PT, CBT.  3) Lymphedema: has seen vascular Orvin Daring in 03/31/23. Uses compression stockings, lymphedema pump daily.   4) Established with Podiatry: Recent h/o Tinea pedis of both feet, erythrasma (rx with Augmentin  and Terbinafine ). Finished antibiotic course and almost done with antifungal. Looking better now.  6 M follow up was recommended on 09/02/23.  5) H/O DVT of right lower leg and PE in 2014: On Xarelto  20 mg daily with dinner. No concern for bleeding.    6) Gout, right foot:  Last flare up was few years ago.  Currently taking Allopurinol  300 mg daily. Needs uric acid checked.   7) Elevated blood pressure reading in the past. Blood pressure in the clinic is good. Denies chest pain.   8) Prediabetes: A1c: 6.2% in 12/12/21.   9) Obesity: Diet: Patient reports his diet could improve. (Drinks soda on average 2 beverages per day), drinks sweet tea, snacks.  Exercise: Can improve. Has tried chair exercise in the past.  OSA concerns: Patient is not  sure if he snores at night. He is not concerned for OSA. Reports wakes up feeling refreshed.   10) Hyperlipidemia:  On Atorvastatin  40 mg a day.   11) Lower leg spasm:  On Gabapentin  600 mg BID. Reports symptoms has been under control.   12) GERD: On Nexium  20 mg before breakfast, symptoms worse specially when he snacks at night.   ROS As per HPI    Objective:     BP 110/70   Pulse 71   Temp 98.2 F (36.8 C) (Oral)   Ht 6' 5 (1.956 m)   Wt (!) 301 lb 12.8 oz (136.9 kg)   SpO2 96%   BMI 35.79 kg/m      09/22/2023    1:16 PM 01/26/2023    3:14 PM 10/14/2022    9:52 AM  Depression screen PHQ 2/9  Decreased Interest 0 0 0  Down, Depressed, Hopeless 0 0 0  PHQ - 2 Score 0 0 0  Altered sleeping 0 0 0  Tired, decreased energy 0 0 0  Change in appetite 0 0 2  Feeling bad or failure about yourself  0 0 0  Trouble concentrating 0 0 0  Moving slowly or fidgety/restless 0 0 0  Suicidal thoughts 0 0 0  PHQ-9 Score 0 0 2  Difficult doing work/chores Not difficult at all Not difficult at all Not difficult at all      09/22/2023    1:16 PM 01/26/2023    3:14 PM 10/13/2022    1:40 PM 07/07/2022    9:22 AM  GAD 7 : Generalized Anxiety  Score  Nervous, Anxious, on Edge 0 0 0 0  Control/stop worrying 0 0 0 0  Worry too much - different things 0 0 0 0  Trouble relaxing 0 0 0 0  Restless 0 0 0 0  Easily annoyed or irritable 0 0 0 0  Afraid - awful might happen 0 0 0 0  Total GAD 7 Score 0 0 0 0  Anxiety Difficulty Not difficult at all Not difficult at all Not difficult at all Not difficult at all      09/22/2023    1:16 PM 01/26/2023    3:14 PM 10/14/2022    9:52 AM  Depression screen PHQ 2/9  Decreased Interest 0 0 0  Down, Depressed, Hopeless 0 0 0  PHQ - 2 Score 0 0 0  Altered sleeping 0 0 0  Tired, decreased energy 0 0 0  Change in appetite 0 0 2  Feeling bad or failure about yourself  0 0 0  Trouble concentrating 0 0 0  Moving slowly or fidgety/restless 0 0 0   Suicidal thoughts 0 0 0  PHQ-9 Score 0 0 2  Difficult doing work/chores Not difficult at all Not difficult at all Not difficult at all      09/22/2023    1:16 PM 01/26/2023    3:14 PM 10/13/2022    1:40 PM 07/07/2022    9:22 AM  GAD 7 : Generalized Anxiety Score  Nervous, Anxious, on Edge 0 0 0 0  Control/stop worrying 0 0 0 0  Worry too much - different things 0 0 0 0  Trouble relaxing 0 0 0 0  Restless 0 0 0 0  Easily annoyed or irritable 0 0 0 0  Afraid - awful might happen 0 0 0 0  Total GAD 7 Score 0 0 0 0  Anxiety Difficulty Not difficult at all Not difficult at all Not difficult at all Not difficult at all   SDOH Screenings   Food Insecurity: No Food Insecurity (01/25/2023)  Housing: Unknown (05/18/2023)   Received from [facility]  Transportation Needs: No Transportation Needs (01/25/2023)  Utilities: Not At Risk (10/14/2022)  Alcohol Screen: Low Risk  (10/14/2022)  Depression (PHQ2-9): Low Risk  (09/22/2023)  Financial Resource Strain: Low Risk  (01/25/2023)  Physical Activity: Inactive (01/25/2023)  Social Connections: Moderately Integrated (01/25/2023)  Stress: No Stress Concern Present (01/25/2023)  Tobacco Use: Medium Risk (09/22/2023)  Health Literacy: Adequate Health Literacy (10/14/2022)     Physical Exam Constitutional:      Appearance: Normal appearance. He is obese.  HENT:     Head: Normocephalic and atraumatic.     Mouth/Throat:     Mouth: Mucous membranes are moist.  Neck:     Thyroid : No thyroid  mass or thyroid  tenderness.   Cardiovascular:     Rate and Rhythm: Normal rate and regular rhythm.     Pulses:          Dorsalis pedis pulses are 2+ on the right side and 2+ on the left side.       Posterior tibial pulses are 2+ on the right side and 2+ on the left side.     Heart sounds: No murmur heard. Pulmonary:     Effort: Pulmonary effort is normal.     Breath sounds: Normal breath sounds.  Abdominal:     General: Abdomen is  protuberant. Bowel sounds are normal.     Palpations: Abdomen is soft.     Tenderness:  There is no guarding.   Musculoskeletal:     Cervical back: Neck supple. No rigidity or tenderness.     Right lower leg: Edema (1+ pitting) present.     Left lower leg: Edema (2+ pitting) present.  Feet:     Right foot:     Skin integrity: No ulcer.     Toenail Condition: Fungal disease present.    Left foot:     Skin integrity: No ulcer.     Toenail Condition: Fungal disease present.  Skin:    General: Skin is warm.   Neurological:     Mental Status: He is alert and oriented to person, place, and time.     Comments: Wheelchair dependent.   Psychiatric:        Mood and Affect: Mood normal.        Behavior: Behavior normal.       No results found for any visits on 09/22/23.  The ASCVD Risk score (Arnett DK, et al., 2019) failed to calculate for the following reasons:   The valid total cholesterol range is 130 to 320 mg/dL     Assessment & Plan:   Prediabetes Assessment & Plan: Check A1c today. Diet: Emphasize whole grains, lean proteins, fruits, and vegetables. Limit processed foods and sugary drinks. Exercise: Aim for 150 minutes of moderate aerobic activity weekly plus strength training twice a week. Weight Loss: Target 5-10% reduction if overweight.   Orders: -     Hemoglobin A1c  Bilateral lower extremity edema Assessment & Plan: Likely multifactorial (wheelchair bound, lymphedema, reduced mobility).  Continue lymphedema therapy, encourage chair exercise as tolerated. F/U with vascular surgery as scheduled.   Orders: -     Comprehensive metabolic panel with GFR  Hyperlipidemia, unspecified hyperlipidemia type Assessment & Plan: Check lipid panel.  Continue Atorvastatin  40 mg daily. Refill sent.    Orders: -     Atorvastatin  Calcium ; Take 1 tablet (40 mg total) by mouth daily.  Dispense: 90 tablet; Refill: 3 -     Lipid panel  Medication management Assessment &  Plan: Check B 12 given patient's h/o chronic PPI use.   Orders: -     Vitamin B12  Idiopathic chronic gout of right foot without tophus Assessment & Plan: Stable on Allopurinol  300 mg daily. Continue. Check Uric acid level today.   Orders: -     Allopurinol ; Take 1 tablet (300 mg total) by mouth daily.  Dispense: 90 tablet; Refill: 3 -     Uric acid  Neuromyelitis optica (HCC) Assessment & Plan: Patient is wheelchair bound.  Management per [facility] neurology (on Truxima ).    History of pulmonary embolism Assessment & Plan: History of right lower leg DVT and pulmonary embolism, currently managed on Xarelto  20 mg daily. Continue.  No evidence of recurrent thromboembolism. No adverse effects from anticoagulation therapy.  Orders: -     Rivaroxaban ; Take 1 tablet (20 mg total) by mouth daily with supper.  Dispense: 90 tablet; Refill: 3  Gastroesophageal reflux disease, unspecified whether esophagitis present Assessment & Plan: Stable on Nexium  20 mg which he takes before breakfast.  Check B 12 today.  Consider EGD in the future.    Orders: -     Esomeprazole  Magnesium ; Take 1 capsule (20 mg total) by mouth daily before breakfast.  Dispense: 90 capsule; Refill: 3  Class 2 severe obesity with serious comorbidity and body mass index (BMI) of 35.0 to 35.9 in adult, unspecified obesity type [facility]) Assessment & Plan: Plan  per prediabetes.  If despite extensive lifestyle modifications unable to achieve weight loss will consider GLP-1 group medication.    Muscle spasm of both lower legs Assessment & Plan: Stable on Gabapentin  600 mg BID. Refill sent.   Orders: -     Gabapentin ; Take 1 tablet (600 mg total) by mouth 2 (two) times daily.  Dispense: 180 tablet; Refill: 1  Chronic pain syndrome Assessment & Plan: Followed by [facility] management at [facility]. He will continue to follow with them.    I spent 45 minutes on the day of this face-to-face encounter reviewing the patient's  medical and surgical history, medications, ongoing concerns, and reviewing the assessment and plan with the patient. This time also included counseling the patient on their health conditions and management options. Additionally, I spent time post-visit ordering and reviewing diagnostics and therapeutics with the patient.  Return in about 6 months (around 03/23/2024), or Chronic, weight follow up.   Luke Shade, MD

## 2023-09-22 NOTE — Assessment & Plan Note (Signed)
 Stable on Allopurinol  300 mg daily. Continue. Check Uric acid level today.

## 2023-09-22 NOTE — Assessment & Plan Note (Signed)
[facility]. He will continue to follow with them.

## 2023-09-22 NOTE — Assessment & Plan Note (Signed)
 Patient is wheelchair bound.  Management per [facility] neurology (on Truxima ).

## 2023-09-22 NOTE — Assessment & Plan Note (Signed)
 Check lipid panel.  Continue Atorvastatin  40 mg daily. Refill sent.

## 2023-09-22 NOTE — Assessment & Plan Note (Signed)
 Check B 12 given patient's h/o chronic PPI use.

## 2023-09-22 NOTE — Assessment & Plan Note (Signed)
 Likely multifactorial (wheelchair bound, lymphedema, reduced mobility).  Continue lymphedema therapy, encourage chair exercise as tolerated. F/U with vascular surgery as scheduled.

## 2023-09-22 NOTE — Assessment & Plan Note (Signed)
 Stable on Gabapentin  600 mg BID. Refill sent.

## 2023-09-22 NOTE — Assessment & Plan Note (Signed)
 Check A1c today. Diet: Emphasize whole grains, lean proteins, fruits, and vegetables. Limit processed foods and sugary drinks. Exercise: Aim for 150 minutes of moderate aerobic activity weekly plus strength training twice a week. Weight Loss: Target 5-10% reduction if overweight.

## 2023-09-22 NOTE — Assessment & Plan Note (Signed)
 Stable on Nexium  20 mg which he takes before breakfast.  Check B 12 today.  Consider EGD in the future.

## 2023-09-28 ENCOUNTER — Ambulatory Visit (INDEPENDENT_AMBULATORY_CARE_PROVIDER_SITE_OTHER): Payer: Medicare HMO | Admitting: Nurse Practitioner

## 2023-09-28 ENCOUNTER — Encounter (INDEPENDENT_AMBULATORY_CARE_PROVIDER_SITE_OTHER): Payer: Self-pay | Admitting: Nurse Practitioner

## 2023-09-28 VITALS — BP 155/96 | HR 60 | Resp 18

## 2023-09-28 DIAGNOSIS — E785 Hyperlipidemia, unspecified: Secondary | ICD-10-CM | POA: Diagnosis not present

## 2023-09-28 DIAGNOSIS — L03119 Cellulitis of unspecified part of limb: Secondary | ICD-10-CM

## 2023-09-28 DIAGNOSIS — I890 Lymphedema, not elsewhere classified: Secondary | ICD-10-CM

## 2023-09-28 NOTE — Progress Notes (Signed)
 Subjective:    Patient ID: Matthew Juarez, male    DOB: 01/14/1961, 63 y.o.   MRN: [id number] Chief Complaint  Patient presents with   Follow-up    6 month follow up    The patient returns to the office for followup evaluation regarding leg swelling.  The swelling has persisted but with the lymph pump is under much, much better controlled. The pain associated with swelling is decreased. There have not been any interval development of a ulcerations or wounds.  The patient denies problems with the pump, noting it is working well and the leggings are in good condition.  Since the previous visit the patient has been wearing graduated compression stockings and using the lymph pump on a routine basis and  has noted significant improvement in the lymphedema.   Patient stated the lymph pump has been helpful with the treatment of the lymphedema.      Review of Systems  Cardiovascular:  Positive for leg swelling.  All other systems reviewed and are negative.      Objective:   Physical Exam Vitals reviewed.  HENT:     Head: Normocephalic.   Cardiovascular:     Rate and Rhythm: Normal rate.  Pulmonary:     Effort: Pulmonary effort is normal.   Musculoskeletal:     Right lower leg: 1+ Edema present.     Left lower leg: 1+ Edema present.   Skin:    General: Skin is warm and dry.   Neurological:     Mental Status: He is alert and oriented to person, place, and time.     Motor: Weakness present.     Gait: Gait abnormal.   Psychiatric:        Mood and Affect: Mood normal.        Behavior: Behavior normal.        Thought Content: Thought content normal.        Judgment: Judgment normal.     BP (!) 155/96   Pulse 60   Resp 18   Past Medical History:  Diagnosis Date   Arthritis    Cardiac arrest (HCC) 06/23/2012   Chronic back pain    Closed right ankle fracture 04/19/2012   COVID-19 04/19/2019   Gout    Hammer toe    Headache(784.0)    History of blood clots     Hypertension    Pulmonary embolism (HCC)    Pulmonary infarction (HCC) 07/23/2012   Overview:  Last Assessment & Plan:  With associated R heart failure and cardiac arrest.  - you could make an argument to treat him with lifelong coumadin  given the severity of his PE, although the literature supports 12 months.  - he will be treated (at least) 12 months, to be followed by Dr Theo.  - will need hypercoag w/u a month after the coumadin  is stopped - restart coumadin  if at any incr   Transverse myelitis [facility])     Social History   Socioeconomic History   Marital status: Married    Spouse name: Not on file   Number of children: Not on file   Years of education: Not on file   Highest education level: 12th grade  Occupational History   Occupation: disability  Tobacco Use   Smoking status: Former    Current packs/day: 0.00    Types: Cigarettes    Start date: 10/01/1982    Quit date: 04/01/1983    Years since quitting: 40.5   Smokeless  tobacco: Never   Tobacco comments:    only smoke 2- cigs per day when he smoked  Vaping Use   Vaping status: Never Used  Substance and Sexual Activity   Alcohol use: No    Alcohol/week: 0.0 standard drinks of alcohol   Drug use: No   Sexual activity: Not Currently  Other Topics Concern   Not on file  Social History Narrative   Married    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    From [place] went to [employer]   Social Drivers of Health   Financial Resource Strain: Low Risk  (01/25/2023)   Overall Financial Resource Strain (CARDIA)    Difficulty of Paying Living Expenses: Not hard at all  Food Insecurity: No Food Insecurity (01/25/2023)   Hunger Vital Sign    Worried About Running Out of Food in the Last Year: Never true    Ran Out of Food in the Last Year: Never true  Transportation Needs: No Transportation Needs (01/25/2023)   PRAPARE - Administrator, Civil Service (Medical): No    Lack of Transportation (Non-Medical): No  Physical  Activity: Inactive (01/25/2023)   Exercise Vital Sign    Days of Exercise per Week: 0 days    Minutes of Exercise per Session: 0 min  Stress: No Stress Concern Present (01/25/2023)   [employer] of Occupational Health - Occupational Stress Questionnaire    Feeling of Stress : Not at all  Social Connections: Moderately Integrated (01/25/2023)   Social Connection and Isolation Panel    Frequency of Communication with Friends and Family: Three times a week    Frequency of Social Gatherings with Friends and Family: Once a week    Attends Religious Services: More than 4 times per year    Active Member of Clubs or Organizations: No    Attends Banker Meetings: Never    Marital Status: Married  Catering manager Violence: Not At Risk (10/14/2022)   Humiliation, Afraid, Rape, and Kick questionnaire    Fear of Current or Ex-Partner: No    Emotionally Abused: No    Physically Abused: No    Sexually Abused: No    Past Surgical History:  Procedure Laterality Date   HAMMER TOE SURGERY     2016    Family History  Problem Relation Age of Onset   Dementia Mother    Lung cancer Maternal Uncle    Colon cancer Maternal Aunt    Lung cancer Maternal Aunt    Heart disease Neg Hx     No Active Allergies     Latest Ref Rng & Units 01/26/2023    3:08 PM 10/13/2022    1:55 PM 12/12/2021   10:17 AM  CBC  WBC 4.0 - 10.5 K/uL 5.9  3.7  4.0   Hemoglobin 13.0 - 17.0 g/dL 85.9  86.5  86.2   Hematocrit 39.0 - 52.0 % 44.0  41.0  41.7   Platelets 150.0 - 400.0 K/uL 201.0  186.0  190.0       CMP     Component Value Date/Time   NA 139 09/22/2023 1343   NA 135 (L) 04/12/2012 1723   K 3.7 09/22/2023 1343   K 4.0 04/12/2012 1723   CL 104 09/22/2023 1343   CL 104 04/12/2012 1723   CO2 30 09/22/2023 1343   CO2 26 04/12/2012 1723   GLUCOSE 101 (H) 09/22/2023 1343   GLUCOSE 90 04/12/2012 1723   BUN 11  09/22/2023 1343   BUN 14 04/12/2012 1723   CREATININE 0.84 09/22/2023 1343    CREATININE 1.05 01/03/2022 1437   CALCIUM  9.1 09/22/2023 1343   CALCIUM  9.2 04/12/2012 1723   PROT 6.3 09/22/2023 1343   PROT 7.7 04/12/2012 1723   ALBUMIN  4.2 09/22/2023 1343   ALBUMIN  4.2 04/12/2012 1723   AST 19 09/22/2023 1343   AST 29 04/12/2012 1723   ALT 18 09/22/2023 1343   ALT 32 04/12/2012 1723   ALKPHOS 106 09/22/2023 1343   ALKPHOS 100 04/12/2012 1723   BILITOT 1.3 (H) 09/22/2023 1343   BILITOT 1.0 04/12/2012 1723   GFR 91.91 09/22/2023 1343   GFRNONAA >60 05/17/2019 1707   GFRNONAA >60 04/12/2012 1723     No results found.     Assessment & Plan:   1. Lymphedema (Primary) Recommend:  No surgery or intervention at this point in time.    I have reviewed my discussion with the patient regarding lymphedema and why it  causes symptoms.  Patient will continue wearing graduated compression on a daily basis. The patient should put the compression on first thing in the morning and removing them in the evening. The patient should not sleep in the compression.   In addition, behavioral modification throughout the day will be continued.  This will include frequent elevation (such as in a recliner), use of over the counter pain medications as needed and exercise such as walking.  The systemic causes for chronic edema such as liver, kidney and cardiac etiologies does not appear to have significant changed over the past year.    The patient will continue aggressive use of the  lymph pump.  This will continue to improve the edema control and prevent sequela such as ulcers and infections.   The patient will follow-up with me on an annual basis.   2. Cellulitis of lower extremity, unspecified laterality No cellulitis noted today.  3. Hyperlipidemia, unspecified hyperlipidemia type Continue statin as ordered and reviewed, no changes at this time   Current Outpatient Medications on File Prior to Visit  Medication Sig Dispense Refill   acetaminophen  (TYLENOL ) 500 MG tablet  Take 500 mg by mouth every 6 (six) hours as needed for mild pain.     allopurinol  (ZYLOPRIM ) 300 MG tablet Take 1 tablet (300 mg total) by mouth daily. 90 tablet 3   atorvastatin  (LIPITOR) 40 MG tablet Take 1 tablet (40 mg total) by mouth daily. 90 tablet 3   Cholecalciferol (VITAMIN D) 2000 UNITS CAPS Take 2,000 Units by mouth daily.     Clobetasol Propionate 0.05 % lotion APPLY TO AFFECTED AREA TWICE A DAY AS NEEDED     Coenzyme Q10 (CO Q-10) 200 MG CAPS Take 1 capsule by mouth daily. 30 each 1   desonide (DESOWEN) 0.05 % cream   3   esomeprazole  (NEXIUM ) 20 MG capsule Take 1 capsule (20 mg total) by mouth daily before breakfast. 90 capsule 3   Fluocinolone Acetonide Scalp 0.01 % OIL APPLY TO SCALP AT BEDTIME, WASH OUT WHEN WAKE UP  3   gabapentin  (NEURONTIN ) 600 MG tablet Take 1 tablet (600 mg total) by mouth 2 (two) times daily. 180 tablet 1   gentamicin  ointment (GARAMYCIN ) 0.1 % APPLY TO AFFECTED AREA 3 TIMES A DAY 15 g 0   ketoconazole  (NIZORAL ) 2 % cream Apply 1 Application topically daily. 60 g 2   oxyCODONE  (OXY IR/ROXICODONE ) 5 MG immediate release tablet Take 5 mg by mouth every 6 (six) hours as  needed.     rivaroxaban  (XARELTO ) 20 MG TABS tablet Take 1 tablet (20 mg total) by mouth daily with supper. 90 tablet 3   terbinafine  (LAMISIL ) 250 MG tablet Take 1 tablet (250 mg total) by mouth daily. 30 tablet 0   No current facility-administered medications on file prior to visit.    There are no Patient Instructions on file for this visit. No follow-ups on file.   Antonella Upson E Sherrill Mckamie, NP

## 2023-09-30 ENCOUNTER — Other Ambulatory Visit: Payer: Self-pay | Admitting: Podiatry

## 2023-10-14 ENCOUNTER — Ambulatory Visit (INDEPENDENT_AMBULATORY_CARE_PROVIDER_SITE_OTHER): Admitting: Podiatry

## 2023-10-14 VITALS — Ht 77.0 in | Wt 301.8 lb

## 2023-10-14 DIAGNOSIS — B353 Tinea pedis: Secondary | ICD-10-CM | POA: Diagnosis not present

## 2023-10-14 MED ORDER — GENTAMICIN SULFATE 0.1 % EX OINT: TOPICAL_OINTMENT | Freq: Every day | CUTANEOUS | 3 refills | Status: DC

## 2023-10-14 NOTE — Progress Notes (Signed)
  Subjective:  Patient ID: Matthew Juarez, male    DOB: November 03, 1960,  MRN: [id number]  Chief Complaint  Patient presents with   Tinea Pedis    RM 4 Patient is here for tinea pedis. Patient has been using the prescribe ointment and cleaning affected area with betadine. Patient is happy with current progress.    63 y.o. male presents with the above complaint. History confirmed with patient.  Notes improvement  Objective:  Physical Exam: warm, good capillary refill, no trophic changes or ulcerative lesions, normal DP and PT pulses, and hammertoe deformities bilateral, interdigital maceration has largely resolved the left foot is still symptomatic  1. Tinea pedis of both feet      Plan:  Patient was evaluated and treated and all questions answered.  Improved quite a bit.  Should not need further oral therapy at this point unless not improved or worsens at some point in the next month.  Continue gentamicin  and Betadine, Rx sent to pharmacy.  We also discussed making quarter strength Dakin's at home with a capful of 10% bleach and a quart of warm water and soaking the feet for 10 minutes.  Advised to wash this off immediately after he uses it for 10 minutes   Return if symptoms worsen or fail to improve.

## 2023-10-16 ENCOUNTER — Telehealth: Payer: Self-pay | Admitting: *Deleted

## 2023-10-16 ENCOUNTER — Ambulatory Visit: Payer: Medicare HMO

## 2023-10-16 VITALS — Ht 77.0 in | Wt 301.0 lb

## 2023-10-16 DIAGNOSIS — Z0000 Encounter for general adult medical examination without abnormal findings: Secondary | ICD-10-CM

## 2023-10-16 NOTE — Patient Instructions (Addendum)
 [name] , Thank you for taking time out of your busy schedule to complete your Annual Wellness Visit with me. I enjoyed our conversation and look forward to speaking with you again next year. I, as well as your care team,  appreciate your ongoing commitment to your health goals. Please review the following plan we discussed and let me know if I can assist you in the future. Your Game plan/ To Do List    Referrals: If you haven't heard from the office you've been referred to, please reach out to them at the phone provided.  Remember to update your tetanus (Tdap) and covid vaccines Follow up Visits: Next Medicare AWV with our clinical staff: 10/19/24 @ 8:10   Have you seen your provider in the last 6 months (3 months if uncontrolled diabetes)? Yes Next Office Visit with your provider: 03/03/24  Clinician Recommendations:  Aim for 30 minutes of exercise or brisk walking, 6-8 glasses of water, and 5 servings of fruits and vegetables each day.       This is a list of the screening recommended for you and due dates:  Health Maintenance  Topic Date Due   DTaP/Tdap/Td vaccine (5 - Tdap) 10/31/1969   COVID-19 Vaccine (4 - 2024-25 season) 11/30/2022   Flu Shot  10/30/2023   Medicare Annual Wellness Visit  10/15/2024   Cologuard (Stool DNA test)  01/14/2025   Hepatitis C Screening  Completed   HIV Screening  Completed   Zoster (Shingles) Vaccine  Completed   Hepatitis B Vaccine  Aged Out   HPV Vaccine  Aged Out   Meningitis B Vaccine  Aged Out   Colon Cancer Screening  Discontinued    Advanced directives: (Declined) Advance directive discussed with you today. Even though you declined this today, please call our office should you change your mind, and we can give you the proper paperwork for you to fill out. Advance Care Planning is important because it:  [x]  Makes sure you receive the medical care that is consistent with your values, goals, and preferences  [x]  It provides guidance to your  family and loved ones and reduces their decisional burden about whether or not they are making the right decisions based on your wishes. Managing Pain Without Opioids Opioids are strong medicines used to treat moderate to severe pain. For some people, especially those who have long-term (chronic) pain, opioids may not be the best choice for pain management due to: Side effects like nausea, constipation, and sleepiness. The risk of addiction (opioid use disorder). The longer you take opioids, the greater your risk of addiction. Pain that lasts for more than 3 months is called chronic pain. Managing chronic pain usually requires more than one approach and is often provided by a team of health care providers working together (multidisciplinary approach). Pain management may be done at a pain management center or pain clinic. How to manage pain without the use of opioids Use non-opioid medicines Non-opioid medicines for pain may include: Over-the-counter or prescription non-steroidal anti-inflammatory drugs (NSAIDs). These may be the first medicines used for pain. They work well for muscle and bone pain, and they reduce swelling. Acetaminophen . This over-the-counter medicine may work well for milder pain but not swelling. Antidepressants. These may be used to treat chronic pain. A certain type of antidepressant (tricyclics) is often used. These medicines are given in lower doses for pain than when used for depression. Anticonvulsants. These are usually used to treat seizures but may also reduce nerve (  neuropathic) pain. Muscle relaxants. These relieve pain caused by sudden muscle tightening (spasms). You may also use a pain medicine that is applied to the skin as a patch, cream, or gel (topical analgesic), such as a numbing medicine. These may cause fewer side effects than medicines taken by mouth. Do certain therapies as directed Some therapies can help with pain management. They include: Physical  therapy. You will do exercises to gain strength and flexibility. A physical therapist may teach you exercises to move and stretch parts of your body that are weak, stiff, or painful. You can learn these exercises at physical therapy visits and practice them at home. Physical therapy may also involve: Massage. Heat wraps or applying heat or cold to affected areas. Electrical signals that interrupt pain signals (transcutaneous electrical nerve stimulation, TENS). Weak lasers that reduce pain and swelling (low-level laser therapy). Signals from your body that help you learn to regulate pain (biofeedback). Occupational therapy. This helps you to learn ways to function at home and work with less pain. Recreational therapy. This involves trying new activities or hobbies, such as a physical activity or drawing. Mental health therapy, including: Cognitive behavioral therapy (CBT). This helps you learn coping skills for dealing with pain. Acceptance and commitment therapy (ACT) to change the way you think and react to pain. Relaxation therapies, including muscle relaxation exercises and mindfulness-based stress reduction. Pain management counseling. This may be individual, family, or group counseling.  Receive medical treatments Medical treatments for pain management include: Nerve block injections. These may include a pain blocker and anti-inflammatory medicines. You may have injections: Near the spine to relieve chronic back or neck pain. Into joints to relieve back or joint pain. Into nerve areas that supply a painful area to relieve body pain. Into muscles (trigger point injections) to relieve some painful muscle conditions. A medical device placed near your spine to help block pain signals and relieve nerve pain or chronic back pain (spinal cord stimulation device). Acupuncture. Follow these instructions at home Medicines Take over-the-counter and prescription medicines only as told by your  health care provider. If you are taking pain medicine, ask your health care providers about possible side effects to watch out for. Do not drive or use heavy machinery while taking prescription opioid pain medicine. Lifestyle  Do not use drugs or alcohol to reduce pain. If you drink alcohol, limit how much you have to: 0-1 drink a day for women who are not pregnant. 0-2 drinks a day for men. Know how much alcohol is in a drink. In the [place] one drink equals one 12 oz bottle of beer (355 mL), one 5 oz glass of wine (148 mL), or one 1 oz glass of hard liquor (44 mL). Do not use any products that contain nicotine or tobacco. These products include cigarettes, chewing tobacco, and vaping devices, such as e-cigarettes. If you need help quitting, ask your health care provider. Eat a healthy diet and maintain a healthy weight. Poor diet and excess weight may make pain worse. Eat foods that are high in fiber. These include fresh fruits and vegetables, whole grains, and beans. Limit foods that are high in fat and processed sugars, such as fried and sweet foods. Exercise regularly. Exercise lowers stress and may help relieve pain. Ask your health care provider what activities and exercises are safe for you. If your health care provider approves, join an exercise class that combines movement and stress reduction. Examples include yoga and tai chi. Get enough sleep. Lack  of sleep may make pain worse. Lower stress as much as possible. Practice stress reduction techniques as told by your therapist. General instructions Work with all your pain management providers to find the treatments that work best for you. You are an important member of your pain management team. There are many things you can do to reduce pain on your own. Consider joining an online or in-person support group for people who have chronic pain. Keep all follow-up visits. This is important. Where to find more information You can find more  information about managing pain without opioids from: American Academy of Pain Medicine: painmed.org Institute for Chronic Pain: instituteforchronicpain.org American Chronic Pain Association: theacpa.org Contact a health care provider if: You have side effects from pain medicine. Your pain gets worse or does not get better with treatments or home therapy. You are struggling with anxiety or depression. Summary Many types of pain can be managed without opioids. Chronic pain may respond better to pain management without opioids. Pain is best managed when you and a team of health care providers work together. Pain management without opioids may include non-opioid medicines, medical treatments, physical therapy, mental health therapy, and lifestyle changes. Tell your health care providers if your pain gets worse or is not being managed well enough. This information is not intended to replace advice given to you by your health care provider. Make sure you discuss any questions you have with your health care provider. Document Revised: 06/27/2020 Document Reviewed: 06/27/2020  Elsevier Patient Education  2024 [employer].

## 2023-10-16 NOTE — Progress Notes (Signed)
 Subjective:   Matthew Juarez is a 63 y.o. who presents for a Medicare Wellness preventive visit.  As a reminder, Annual Wellness Visits don't include a physical exam, and some assessments may be limited, especially if this visit is performed virtually. We may recommend an in-person follow-up visit with your provider if needed.  Visit Complete: Virtual I connected with  Matthew Juarez on 10/16/23 by a audio enabled telemedicine application and verified that I am speaking with the correct person using two identifiers.  Patient Location: Home  Provider Location: Home Office  I discussed the limitations of evaluation and management by telemedicine. The patient expressed understanding and agreed to proceed.  Vital Signs: Because this visit was a virtual/telehealth visit, some criteria may be missing or patient reported. Any vitals not documented were not able to be obtained and vitals that have been documented are patient reported.  VideoDeclined- This patient declined Librarian, academic. Therefore the visit was completed with audio only.  Persons Participating in Visit: Patient.  AWV Questionnaire: No: Patient Medicare AWV questionnaire was not completed prior to this visit.  Cardiac Risk Factors include: advanced age (>36men, >82 women);male gender     Objective:    Today's Vitals   10/16/23 0813  Weight: (!) 301 lb (136.5 kg)  Height: 6' 5 (1.956 m)   Body mass index is 35.69 kg/m.     10/16/2023    8:25 AM 10/14/2022    9:58 AM 09/27/2021    3:34 PM 09/21/2020    9:20 AM 09/21/2019    9:08 AM 07/17/2019   12:22 AM 04/28/2019    5:46 PM  Advanced Directives  Does Patient Have a Medical Advance Directive? No No No No No No No  Would patient like information on creating a medical advance directive? No - Patient declined  No - Patient declined No - Patient declined No - Patient declined  No - Patient declined    Current Medications  (verified) Outpatient Encounter Medications as of 10/16/2023  Medication Sig   acetaminophen  (TYLENOL ) 500 MG tablet Take 500 mg by mouth every 6 (six) hours as needed for mild pain.   allopurinol  (ZYLOPRIM ) 300 MG tablet Take 1 tablet (300 mg total) by mouth daily.   atorvastatin  (LIPITOR) 40 MG tablet Take 1 tablet (40 mg total) by mouth daily.   Cholecalciferol (VITAMIN D) 2000 UNITS CAPS Take 2,000 Units by mouth daily.   Clobetasol Propionate 0.05 % lotion APPLY TO AFFECTED AREA TWICE A DAY AS NEEDED   Coenzyme Q10 (CO Q-10) 200 MG CAPS Take 1 capsule by mouth daily.   desonide (DESOWEN) 0.05 % cream    esomeprazole  (NEXIUM ) 20 MG capsule Take 1 capsule (20 mg total) by mouth daily before breakfast.   Fluocinolone Acetonide Scalp 0.01 % OIL APPLY TO SCALP AT BEDTIME, WASH OUT WHEN WAKE UP   gabapentin  (NEURONTIN ) 600 MG tablet Take 1 tablet (600 mg total) by mouth 2 (two) times daily.   gentamicin  ointment (GARAMYCIN ) 0.1 % Apply topically daily.   ketoconazole  (NIZORAL ) 2 % cream Apply 1 Application topically daily.   oxyCODONE  (OXY IR/ROXICODONE ) 5 MG immediate release tablet Take 5 mg by mouth every 6 (six) hours as needed.   rivaroxaban  (XARELTO ) 20 MG TABS tablet Take 1 tablet (20 mg total) by mouth daily with supper.   terbinafine  (LAMISIL ) 250 MG tablet TAKE 1 TABLET BY MOUTH EVERY DAY   No facility-administered encounter medications on file as of 10/16/2023.  Allergies (verified) Patient has no active allergies.   History: Past Medical History:  Diagnosis Date   Arthritis    Cardiac arrest (HCC) 06/23/2012   Chronic back pain    Closed right ankle fracture 04/19/2012   COVID-19 04/19/2019   Gout    Hammer toe    Headache(784.0)    History of blood clots    Hypertension    Pulmonary embolism (HCC)    Pulmonary infarction (HCC) 07/23/2012   Overview:  Last Assessment & Plan:  With associated R heart failure and cardiac arrest.  - you could make an argument to treat  him with lifelong coumadin  given the severity of his PE, although the literature supports 12 months.  - he will be treated (at least) 12 months, to be followed by Dr Theo.  - will need hypercoag w/u a month after the coumadin  is stopped - restart coumadin  if at any incr   Transverse myelitis [facility])    Past Surgical History:  Procedure Laterality Date   HAMMER TOE SURGERY     2016   Family History  Problem Relation Age of Onset   Dementia Mother    Lung cancer Maternal Uncle    Colon cancer Maternal Aunt    Lung cancer Maternal Aunt    Heart disease Neg Hx    Social History   Socioeconomic History   Marital status: Married    Spouse name: Not on file   Number of children: Not on file   Years of education: Not on file   Highest education level: 12th grade  Occupational History   Occupation: disability  Tobacco Use   Smoking status: Former    Current packs/day: 0.00    Types: Cigarettes    Start date: 10/01/1982    Quit date: 04/01/1983    Years since quitting: 40.5   Smokeless tobacco: Never   Tobacco comments:    only smoke 2- cigs per day when he smoked  Vaping Use   Vaping status: Never Used  Substance and Sexual Activity   Alcohol use: No    Alcohol/week: 0.0 standard drinks of alcohol   Drug use: No   Sexual activity: Not Currently  Other Topics Concern   Not on file  Social History Narrative   Married    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    From [place] went to [employer]   Social Drivers of Health   Financial Resource Strain: Low Risk  (10/16/2023)   Overall Financial Resource Strain (CARDIA)    Difficulty of Paying Living Expenses: Not hard at all  Food Insecurity: No Food Insecurity (10/16/2023)   Hunger Vital Sign    Worried About Running Out of Food in the Last Year: Never true    Ran Out of Food in the Last Year: Never true  Transportation Needs: No Transportation Needs (10/16/2023)   PRAPARE - Administrator, Civil Service (Medical): No     Lack of Transportation (Non-Medical): No  Physical Activity: Inactive (10/16/2023)   Exercise Vital Sign    Days of Exercise per Week: 0 days    Minutes of Exercise per Session: 0 min  Stress: No Stress Concern Present (10/16/2023)   [employer] of Occupational Health - Occupational Stress Questionnaire    Feeling of Stress: Not at all  Social Connections: Moderately Integrated (10/16/2023)   Social Connection and Isolation Panel    Frequency of Communication with Friends and Family: More than three times a week  Frequency of Social Gatherings with Friends and Family: Twice a week    Attends Religious Services: More than 4 times per year    Active Member of [employer] or Organizations: No    Attends Engineer, structural: Never    Marital Status: Married    Tobacco Counseling Counseling given: Not Answered Tobacco comments: only smoke 2- cigs per day when he smoked    Clinical Intake:  Pre-visit preparation completed: Yes  Pain : No/denies pain     BMI - recorded: 35.69 Nutritional Status: BMI > 30  Obese Nutritional Risks: None Diabetes: No  Lab Results  Component Value Date   HGBA1C 5.9 09/22/2023   HGBA1C 6.2 12/12/2021   HGBA1C 6.1 06/26/2021     How often do you need to have someone help you when you read instructions, pamphlets, or other written materials from your doctor or pharmacy?: 1 - Never  Interpreter Needed?: No  Information entered by :: R. Haadi Santellan LPN   Activities of Daily Living     10/16/2023    8:15 AM  In your present state of health, do you have any difficulty performing the following activities:  Hearing? 0  Vision? 0  Comment readers  Difficulty concentrating or making decisions? 0  Walking or climbing stairs? 1  Dressing or bathing? 0  Doing errands, shopping? 1  Preparing Food and eating ? N  Using the Toilet? N  In the past six months, have you accidently leaked urine? N  Do you have problems with loss of bowel control? Y   Managing your Medications? N  Managing your Finances? N  Housekeeping or managing your Housekeeping? N    Patient Care Team: Bair, Kalpana, MD as PCP - General (Family Medicine) Delores Orvin BRAVO, NP as Nurse Practitioner (Vascular Surgery) True Genette Jama DOUGLAS, MD as Referring Physician (Neurology)  I have updated your Care Teams any recent Medical Services you may have received from other providers in the past year.     Assessment:   This is a routine wellness examination for Jori.  Hearing/Vision screen Hearing Screening - Comments:: No issues Vision Screening - Comments:: readers   Goals Addressed             This Visit's Progress    Patient Stated       Wants to start exercising       Depression Screen     10/16/2023    8:21 AM 09/22/2023    1:16 PM 01/26/2023    3:14 PM 10/14/2022    9:52 AM 10/13/2022    1:39 PM 07/07/2022    9:22 AM 04/03/2022   11:17 AM  PHQ 2/9 Scores  PHQ - 2 Score 0 0 0 0 0 0 0  PHQ- 9 Score 0 0 0 2 0 0 0    Fall Risk     10/16/2023    8:17 AM 09/22/2023    1:16 PM 01/26/2023    3:14 PM 10/14/2022    9:55 AM 10/13/2022    1:39 PM  Fall Risk   Falls in the past year? 0 0 0 0 0  Number falls in past yr: 0 0 0 0 0  Injury with Fall? 0 0 0 0 0  Risk for fall due to : No Fall Risks No Fall Risks No Fall Risks No Fall Risks No Fall Risks  Follow up Falls evaluation completed;Falls prevention discussed Falls evaluation completed Falls evaluation completed Falls prevention discussed;Falls evaluation completed Falls evaluation  completed    MEDICARE RISK AT HOME:  Medicare Risk at Home Any stairs in or around the home?: Yes If so, are there any without handrails?: No Home free of loose throw rugs in walkways, pet beds, electrical cords, etc?: Yes Adequate lighting in your home to reduce risk of falls?: Yes Life alert?: No Use of a cane, walker or w/c?: Yes Grab bars in the bathroom?: Yes Shower chair or bench in shower?:  Yes Elevated toilet seat or a handicapped toilet?: Yes  TIMED UP AND GO:  Was the test performed?  No  Cognitive Function: 6CIT completed    09/09/2017   10:54 AM 08/21/2016   10:26 AM  MMSE - Mini Mental State Exam  Orientation to time 5 5   Orientation to Place 5 5   Registration 3 3   Attention/ Calculation 5 5   Recall 3 3   Language- name 2 objects 2 2   Language- repeat 1 1  Language- follow 3 step command 3 3   Language- read & follow direction 1 1   Write a sentence 1 1   Copy design 1 1   Total score 30 30      Data saved with a previous flowsheet row definition        10/16/2023    8:26 AM 10/14/2022    9:59 AM 09/21/2020    9:15 AM 09/21/2019    9:14 AM 09/20/2018    9:30 AM  6CIT Screen  What Year? 0 points 0 points 0 points 0 points 0 points  What month? 0 points 0 points 0 points 0 points 0 points  What time? 0 points 0 points 0 points  0 points  Count back from 20 0 points 0 points   0 points  Months in reverse 0 points 0 points 0 points 0 points 0 points  Repeat phrase 0 points 0 points  0 points   Total Score 0 points 0 points       Immunizations Immunization History  Administered Date(s) Administered   Dtap, Unspecified 06/07/1959, 07/10/1959, 10/09/1959, 09/26/1963   Hepatitis A, Adult 11/12/2017, 11/09/2018   Influenza Split 01/30/2012   Influenza, Seasonal, Injecte, Preservative Fre 01/26/2023   Influenza,inj,Quad PF,6+ Mos 12/29/2013, 01/16/2016, 04/27/2017, 02/09/2018, 12/22/2018, 05/23/2020, 01/03/2022   Influenza-Unspecified 02/19/2012, 01/01/2014, 01/16/2016, 04/27/2017, 02/09/2018   PFIZER(Purple Top)SARS-COV-2 Vaccination 06/25/2019, 07/16/2019   PPD Test 12/05/2011   Pfizer Covid-19 Vaccine Bivalent Booster 76yrs & up 03/29/2021   Polio, Unspecified 06/07/1959, 07/10/1959, 10/09/1959   Smallpox 11/23/1964   Zoster Recombinant(Shingrix) 03/13/2021, 05/15/2021    Screening Tests Health Maintenance  Topic Date Due   DTaP/Tdap/Td (5  - Tdap) 10/31/1969   COVID-19 Vaccine (4 - 2024-25 season) 11/30/2022   Medicare Annual Wellness (AWV)  10/14/2023   INFLUENZA VACCINE  10/30/2023   Fecal DNA (Cologuard)  01/14/2025   Hepatitis C Screening  Completed   HIV Screening  Completed   Zoster Vaccines- Shingrix  Completed   Hepatitis B Vaccines  Aged Out   HPV VACCINES  Aged Out   Meningococcal B Vaccine  Aged Out   Colonoscopy  Discontinued    Health Maintenance  Health Maintenance Due  Topic Date Due   DTaP/Tdap/Td (5 - Tdap) 10/31/1969   COVID-19 Vaccine (4 - 2024-25 season) 11/30/2022   Medicare Annual Wellness (AWV)  10/14/2023   Health Maintenance Items Addressed: Discussed the need to update covid and tetanus (Tdap) vaccine.   Additional Screening:  Vision Screening: Recommended annual ophthalmology  exams for early detection of glaucoma and other disorders of the eye. Up to date  [facility] Would you like a referral to an eye doctor? No    Dental Screening: Recommended annual dental exams for proper oral hygiene  Community Resource Referral / Chronic Care Management: CRR required this visit?  No   CCM required this visit?  No   Plan:    I have personally reviewed and noted the following in the patient's chart:   Medical and social history Use of alcohol, tobacco or illicit drugs  Current medications and supplements including opioid prescriptions.  Patient is currently taking opioid prescription Functional ability and status Nutritional status Physical activity Advanced directives List of other physicians Hospitalizations, surgeries, and ER visits in previous 12 months Vitals Screenings to include cognitive, depression, and falls Referrals and appointments  In addition, I have reviewed and discussed with patient certain preventive protocols, quality metrics, and best practice recommendations. A written personalized care plan for preventive services as well as general preventive health  recommendations were provided to patient.   Angeline Fredericks, LPN   [id number]   After Visit Summary: (MyChart) Due to this being a telephonic visit, the after visit summary with patients personalized plan was offered to patient via MyChart   Notes: Nothing significant to report at this time.

## 2023-10-16 NOTE — Telephone Encounter (Signed)
 Performed AWV  While on the visit patient stated that he got his shower chair back in 2014 and needs a new one. Patient requested a written script for a shower chair. Patient requested a call back when the order is ready.

## 2023-10-16 NOTE — Telephone Encounter (Signed)
 Ok to write order for DME for new shower chair

## 2023-10-19 ENCOUNTER — Other Ambulatory Visit: Payer: Self-pay

## 2023-10-19 DIAGNOSIS — R600 Localized edema: Secondary | ICD-10-CM

## 2023-10-19 NOTE — Telephone Encounter (Signed)
 Done

## 2023-10-19 NOTE — Telephone Encounter (Signed)
 Left message for patient to give our office a call back to ask patient where it is he would like to have requested DME order for Shower Chair sent to or if he wanted to come and pick up the order and take it to his preference.   OK for [id number] to give note if patient calls back. If relayed, please notify the office.

## 2023-10-19 NOTE — Telephone Encounter (Signed)
 Okay to write order for shower chair, DME for this patient.   Dx can be any of the followings: Polyarthritis, chronic back pain, hammer toe, obesity, prediabetes, lower leg spasm, b/l lower leg lymphedema, transverse myelitis history with residual complications.    Thank you,  Luke Shade, MD

## 2023-10-19 NOTE — Addendum Note (Signed)
 Addended by: Pama Roskos on: 10/19/2023 08:40 AM   Modules accepted: Orders

## 2023-10-19 NOTE — Telephone Encounter (Signed)
 Dme Ordered

## 2023-10-19 NOTE — Addendum Note (Signed)
 Addended by: BRIEN SHARENE RAMAN on: 10/19/2023 09:29 AM   Modules accepted: Orders

## 2023-10-20 ENCOUNTER — Telehealth: Payer: Self-pay

## 2023-10-20 NOTE — Telephone Encounter (Unsigned)
 Copied from CRM #[id number]. Topic: General - Inquiry >> Oct 20, 2023  2:11 PM Gennette ORN wrote: Patient is calling and wanting an update on his shower chair. He wants to know when will it be coming.

## 2023-10-20 NOTE — Telephone Encounter (Signed)
 Spoke with patient to make him aware of the recommendations of either he can pick it up or he can let us  know of a medical supply location of his preference and we can fax it. Patient says he will be in town tomorrow and he will stop by and pick it up. Verbalized understanding.

## 2023-10-30 ENCOUNTER — Other Ambulatory Visit: Payer: Self-pay | Admitting: Podiatry

## 2023-12-23 DIAGNOSIS — M25512 Pain in left shoulder: Secondary | ICD-10-CM | POA: Diagnosis not present

## 2023-12-23 DIAGNOSIS — G8929 Other chronic pain: Secondary | ICD-10-CM | POA: Diagnosis not present

## 2023-12-23 DIAGNOSIS — G894 Chronic pain syndrome: Secondary | ICD-10-CM | POA: Diagnosis not present

## 2024-02-04 ENCOUNTER — Other Ambulatory Visit: Payer: Self-pay | Admitting: Podiatry

## 2024-02-09 ENCOUNTER — Telehealth: Payer: Self-pay

## 2024-02-09 NOTE — Telephone Encounter (Signed)
 Pt dropped off [employer] [place] papers to be filled out by Dr Abbey. He is requesting they be mailed back to him. They're in the color folder up front

## 2024-02-10 NOTE — Telephone Encounter (Signed)
 Noted. Will update once I have the form looked and filled out.   Thank you,  Luke Shade, MD

## 2024-02-11 NOTE — Telephone Encounter (Signed)
 [employer] [place] form filled for transportation on 02/11/24 and placed on completed folder.   Luke Shade, MD

## 2024-02-11 NOTE — Telephone Encounter (Signed)
 Requested paperwork has been completed and mailed to the patient per patient request.

## 2024-02-24 ENCOUNTER — Ambulatory Visit

## 2024-02-24 VITALS — BP 120/66 | HR 58 | Temp 98.4°F | Ht 77.0 in | Wt 314.0 lb

## 2024-02-24 DIAGNOSIS — M1A0710 Idiopathic chronic gout, right ankle and foot, without tophus (tophi): Secondary | ICD-10-CM

## 2024-02-24 DIAGNOSIS — R7303 Prediabetes: Secondary | ICD-10-CM

## 2024-02-24 DIAGNOSIS — Z7901 Long term (current) use of anticoagulants: Secondary | ICD-10-CM | POA: Diagnosis not present

## 2024-02-24 DIAGNOSIS — Z6837 Body mass index (BMI) 37.0-37.9, adult: Secondary | ICD-10-CM

## 2024-02-24 DIAGNOSIS — E66812 Obesity, class 2: Secondary | ICD-10-CM | POA: Diagnosis not present

## 2024-02-24 DIAGNOSIS — E782 Mixed hyperlipidemia: Secondary | ICD-10-CM | POA: Diagnosis not present

## 2024-02-24 DIAGNOSIS — G360 Neuromyelitis optica [Devic]: Secondary | ICD-10-CM

## 2024-02-24 DIAGNOSIS — Z23 Encounter for immunization: Secondary | ICD-10-CM | POA: Diagnosis not present

## 2024-02-24 DIAGNOSIS — K219 Gastro-esophageal reflux disease without esophagitis: Secondary | ICD-10-CM | POA: Diagnosis not present

## 2024-02-24 NOTE — Assessment & Plan Note (Signed)
 Risk of diabetes progression if dietary habits not improved.  Encouraged dietary modifications to reduce sugar intake. Check A1c during his f/u visit.  Orders:   HgB A1c; Future

## 2024-02-24 NOTE — Progress Notes (Signed)
 Established Patient Office Visit   Subjective  Patient ID: Matthew Juarez, male    DOB: September 14, 1960  Age: 63 y.o. MRN: [id number]  Chief Complaint  Patient presents with   Weight Check    Discussed the use of AI scribe software for clinical note transcription with the patient, who gave verbal consent to proceed.  History of Present Illness JONAVEN HILGERS is a 63 year old male with known h/o neuromyelitis optica (Neurology team at [facility]: Dr. True and PA Renae), wheel chair bound due to this, chronic pain syndrome, gout, dyslipidemia, prediabetes presents for chronic medication management.  On rituximab  for disease modifying therapy but developed sensation of throat closing during previous infusion. He is set to have next infusion on 02/18/24 at [facility] and is recommended to take Claritin 10 mg starting tomorrow till the day of infusion to help reduce r/o adverse reaction.   - Chronic pain syndrome, left shoulder pain, lymphedema b/l lowe legs, morbid obesity, prediabetes, hyperlipidemia, GERD:  Pain clinic [facility], currently on Oxycodone  5 mg which he takes on average once a week and Gabapentin  300 mg twice a day. Current treatment through pain clinic and has helped him to carry out ADLs.   He gained about 13 pounds since his last visit with me in June (was 301 lbs now is 314 lbs) since his last visit. He attributes this to increased snacking, particularly on chips and sweets, and a lack of exercise. He used to perform chair exercises with his wife but has not been active recently.  He follows up with vascular surgery for lymphedema, wears compression socks.   Currently on Allopurinol  300 mg daily without recent gout flare up. Uric acid level from previous lab within normal range.   Esomeprazole  20 mg in the morning daily for acid reflux has helped keeping symptoms stable.   He has a history of prediabetes. No symptoms of dizziness, fatigue, palpitations, or bowel movement issues. He denies any  skin ulcers or lesions.  He does not smoke, vape, or drink alcohol. He has a history of hammer toe surgery in 2016 and no other surgeries. There is no change in his family history.  He has a h/o R lower leg DVT with PE and is on chronic anticoagulation with Xarelto  20 mg daily.   ROS As per HPI    Objective:     BP 120/66 (BP Location: Right Arm, Patient Position: Sitting, Cuff Size: Normal)   Pulse (!) 58   Temp 98.4 F (36.9 C) (Oral)   Ht 6' 5 (1.956 m)   Wt (!) 314 lb (142.4 kg)   SpO2 98%   BMI 37.23 kg/m      02/24/2024    8:14 AM 10/16/2023    8:21 AM 09/22/2023    1:16 PM  Depression screen PHQ 2/9  Decreased Interest 0 0 0  Down, Depressed, Hopeless 0 0 0  PHQ - 2 Score 0 0 0  Altered sleeping 0 0 0  Tired, decreased energy 0 0 0  Change in appetite 0 0 0  Feeling bad or failure about yourself  0 0 0  Trouble concentrating 0 0 0  Moving slowly or fidgety/restless 0 0 0  Suicidal thoughts 0 0 0  PHQ-9 Score 0 0  0   Difficult doing work/chores Not difficult at all Not difficult at all Not difficult at all     Data saved with a previous flowsheet row definition      02/24/2024  8:14 AM 09/22/2023    1:16 PM 01/26/2023    3:14 PM 10/13/2022    1:40 PM  GAD 7 : Generalized Anxiety Score  Nervous, Anxious, on Edge 0 0 0 0  Control/stop worrying 0 0 0 0  Worry too much - different things 0 0 0 0  Trouble relaxing 0 0 0 0  Restless 0 0 0 0  Easily annoyed or irritable 0 0 0 0  Afraid - awful might happen 0 0 0 0  Total GAD 7 Score 0 0 0 0  Anxiety Difficulty Not difficult at all Not difficult at all Not difficult at all Not difficult at all      02/24/2024    8:14 AM 10/16/2023    8:21 AM 09/22/2023    1:16 PM  Depression screen PHQ 2/9  Decreased Interest 0 0 0  Down, Depressed, Hopeless 0 0 0  PHQ - 2 Score 0 0 0  Altered sleeping 0 0 0  Tired, decreased energy 0 0 0  Change in appetite 0 0 0  Feeling bad or failure about yourself  0 0 0   Trouble concentrating 0 0 0  Moving slowly or fidgety/restless 0 0 0  Suicidal thoughts 0 0 0  PHQ-9 Score 0 0  0   Difficult doing work/chores Not difficult at all Not difficult at all Not difficult at all     Data saved with a previous flowsheet row definition      02/24/2024    8:14 AM 09/22/2023    1:16 PM 01/26/2023    3:14 PM 10/13/2022    1:40 PM  GAD 7 : Generalized Anxiety Score  Nervous, Anxious, on Edge 0 0 0 0  Control/stop worrying 0 0 0 0  Worry too much - different things 0 0 0 0  Trouble relaxing 0 0 0 0  Restless 0 0 0 0  Easily annoyed or irritable 0 0 0 0  Afraid - awful might happen 0 0 0 0  Total GAD 7 Score 0 0 0 0  Anxiety Difficulty Not difficult at all Not difficult at all Not difficult at all Not difficult at all   SDOH Screenings   Food Insecurity: No Food Insecurity (02/23/2024)  Housing: Unknown (02/23/2024)  Transportation Needs: No Transportation Needs (02/23/2024)  Utilities: Not At Risk (10/16/2023)  Alcohol Screen: Low Risk  (10/16/2023)  Depression (PHQ2-9): Low Risk  (02/24/2024)  Financial Resource Strain: Low Risk  (02/23/2024)  Physical Activity: Insufficiently Active (02/23/2024)  Social Connections: Moderately Integrated (02/23/2024)  Stress: No Stress Concern Present (02/23/2024)  Tobacco Use: Medium Risk (02/24/2024)  Health Literacy: Adequate Health Literacy (10/16/2023)     Physical Exam Constitutional:      Appearance: He is obese.     Comments: On a wheelchair   HENT:     Head: Normocephalic and atraumatic.     Right Ear: Tympanic membrane normal. There is no impacted cerumen.     Left Ear: Tympanic membrane normal. There is no impacted cerumen.     Mouth/Throat:     Mouth: Mucous membranes are moist.  Cardiovascular:     Rate and Rhythm: Bradycardia present.  Pulmonary:     Effort: Pulmonary effort is normal.     Breath sounds: Normal breath sounds.  Abdominal:     General: Abdomen is protuberant.     Palpations:  Abdomen is soft.     Tenderness: There is no abdominal tenderness.  Musculoskeletal:  Cervical back: Neck supple. No rigidity.     Right lower leg: No edema.     Left lower leg: No edema.  Skin:    General: Skin is warm.  Neurological:     Mental Status: He is alert and oriented to person, place, and time.  Psychiatric:        Mood and Affect: Mood normal.        No results found for any visits on 02/24/24.  The 10-year ASCVD risk score (Arnett DK, et al., 2019) is: 8.4%     Assessment & Plan:   Assessment & Plan Class 2 severe obesity with serious comorbidity and body mass index (BMI) of 37.0 to 37.9 in adult, unspecified obesity type Weight increased to 314 lbs from 301 lbs during 08/2023 visit. Prediabetes risk noted. Lifestyle modifications discussed. Regular exercise limited due to patient's neurological condition causing him to be wheel chair bound.  Encouraged dietary changes to reduce junk food, increase fruits and vegetables. Recommended chair and strengthening exercises twice weekly. Aim for 5% weight loss from 314 lbs. Potential referral to physical therapy if lifestyle changes insufficient. Orders:   Vitamin D (25 hydroxy); Future  Needs flu shot Updated today.  Orders:   Flu vaccine HIGH DOSE PF(Fluzone Trivalent)  Idiopathic chronic gout of right foot without tophus Chronic without acute flare up on Allopurinol  300 mg daily, continue. Check uric acid level during f/u visit.  Orders:   Uric acid; Future  Prediabetes Risk of diabetes progression if dietary habits not improved.  Encouraged dietary modifications to reduce sugar intake. Check A1c during his f/u visit.  Orders:   HgB A1c; Future  Mixed hyperlipidemia Tolerating Lipitor 40 mg at night time, continue. Check CMP, lipid panel during his next appointment.  Orders:   Lipid panel; Future   Comp Met (CMET); Future  Chronic anticoagulation He has a h/o R lower leg DVT with  PE and is on  chronic anticoagulation with Xarelto  20 mg daily. Continue.     Gastroesophageal reflux disease without esophagitis Well controlled on Nexium  20 mg daily, continue.     Neuromyelitis optica (HCC) Continue follow up with [facility] team (Dr. True and PA, Renae). Preparing for Rituximab  infusion on 02/28/24. Insure he starts taking Claritin 10 mg starting 02/25/24.  [employer] [place] form filled for transportation on 02/11/24 to provide access to transportation for medical needs.        Return in about 6 months (around 08/23/2024) for Chronic follow up and labs on the same day .   Luke Shade, MD

## 2024-02-24 NOTE — Assessment & Plan Note (Signed)
 He has a h/o R lower leg DVT with  PE and is on chronic anticoagulation with Xarelto  20 mg daily. Continue.

## 2024-02-24 NOTE — Assessment & Plan Note (Signed)
 Updated today.  Orders:   Flu vaccine HIGH DOSE PF(Fluzone Trivalent)

## 2024-02-24 NOTE — Assessment & Plan Note (Signed)
 Chronic without acute flare up on Allopurinol  300 mg daily, continue. Check uric acid level during f/u visit.  Orders:   Uric acid; Future

## 2024-02-24 NOTE — Assessment & Plan Note (Signed)
 Weight increased to 314 lbs from 301 lbs during 08/2023 visit. Prediabetes risk noted. Lifestyle modifications discussed. Regular exercise limited due to patient's neurological condition causing him to be wheel chair bound.  Encouraged dietary changes to reduce junk food, increase fruits and vegetables. Recommended chair and strengthening exercises twice weekly. Aim for 5% weight loss from 314 lbs. Potential referral to physical therapy if lifestyle changes insufficient. Orders:   Vitamin D (25 hydroxy); Future

## 2024-02-24 NOTE — Assessment & Plan Note (Signed)
 Continue follow up with [facility] team (Dr. True and PA, Renae). Preparing for Rituximab  infusion on 02/28/24. Insure he starts taking Claritin 10 mg starting 02/25/24.  [employer] [place] form filled for transportation on 02/11/24 to provide access to transportation for medical needs.

## 2024-02-24 NOTE — Patient Instructions (Signed)
 Diet: Emphasize whole grains, lean proteins, fruits, and vegetables. Limit processed foods and sugary drinks. Exercise: Aim for 150 minutes of moderate aerobic activity weekly plus strength training twice a week, obviously as tolerated, chair exercise is safe.  Weight Loss: Target 5-%. Your weight today is 314

## 2024-02-24 NOTE — Assessment & Plan Note (Signed)
 Well controlled on Nexium  20 mg daily, continue.

## 2024-02-24 NOTE — Assessment & Plan Note (Signed)
 Tolerating Lipitor 40 mg at night time, continue. Check CMP, lipid panel during his next appointment.  Orders:   Lipid panel; Future   Comp Met (CMET); Future

## 2024-02-28 DIAGNOSIS — G360 Neuromyelitis optica [Devic]: Secondary | ICD-10-CM | POA: Diagnosis not present

## 2024-03-23 ENCOUNTER — Ambulatory Visit

## 2024-03-29 ENCOUNTER — Telehealth: Payer: Self-pay

## 2024-03-29 NOTE — Telephone Encounter (Signed)
 Pt dropped off some paperwork that needs just one question clarified so he can gain access to the bus for transportation. It's in the color folder up front.

## 2024-04-01 NOTE — Telephone Encounter (Signed)
 Copied from CRM [id number]. Topic: General - Other >> Apr 01, 2024 11:42 AM Deaijah H wrote: Reason for CRM: Patient called in wanting to know if paperwork was ready advised it wasn't but would like to know if it can be ready by EOD today. Please call to confirm  I left a voicemail for patient asking him to please call us  back.  I also sent a message to patient via MyChart.  E2C2 - when patient calls back, please let him know that Dr. Luke Shade is out of the office today and we are expecting her back on 04/04/2024.

## 2024-04-05 NOTE — Telephone Encounter (Signed)
 Please let the patient know the form has been completed. Signed form placed at completed folder.   Luke Shade, MD

## 2024-04-05 NOTE — Telephone Encounter (Signed)
 Spoke with patient to notify him that his paperwork is ready for pick up. Patient verbalized understanding and states he will pick it up tomorrow.

## 2024-04-05 NOTE — Telephone Encounter (Signed)
 Paperwork has been placed in provider's folder to review and completed requested forms. Please advise?

## 2024-08-25 ENCOUNTER — Ambulatory Visit

## 2024-09-27 ENCOUNTER — Ambulatory Visit (INDEPENDENT_AMBULATORY_CARE_PROVIDER_SITE_OTHER): Admitting: Nurse Practitioner

## 2024-10-19 ENCOUNTER — Ambulatory Visit
